# Patient Record
Sex: Female | Born: 1965 | Race: White | Hispanic: No | Smoking: Never smoker
Health system: Southern US, Community
[De-identification: ages and names within clinical notes are randomized; demographics above are authoritative.]

## PROBLEM LIST (undated history)

## (undated) DIAGNOSIS — R011 Cardiac murmur, unspecified: Secondary | ICD-10-CM

## (undated) DIAGNOSIS — J45909 Unspecified asthma, uncomplicated: Secondary | ICD-10-CM

## (undated) DIAGNOSIS — K8020 Calculus of gallbladder without cholecystitis without obstruction: Secondary | ICD-10-CM

## (undated) DIAGNOSIS — E785 Hyperlipidemia, unspecified: Secondary | ICD-10-CM

## (undated) DIAGNOSIS — M940 Chondrocostal junction syndrome [Tietze]: Secondary | ICD-10-CM

## (undated) DIAGNOSIS — F419 Anxiety disorder, unspecified: Secondary | ICD-10-CM

## (undated) DIAGNOSIS — F32A Depression, unspecified: Secondary | ICD-10-CM

## (undated) DIAGNOSIS — F509 Eating disorder, unspecified: Secondary | ICD-10-CM

## (undated) DIAGNOSIS — T4145XA Adverse effect of unspecified anesthetic, initial encounter: Secondary | ICD-10-CM

## (undated) DIAGNOSIS — K219 Gastro-esophageal reflux disease without esophagitis: Secondary | ICD-10-CM

## (undated) DIAGNOSIS — T8859XA Other complications of anesthesia, initial encounter: Secondary | ICD-10-CM

## (undated) DIAGNOSIS — D509 Iron deficiency anemia, unspecified: Secondary | ICD-10-CM

## (undated) DIAGNOSIS — K635 Polyp of colon: Secondary | ICD-10-CM

## (undated) DIAGNOSIS — F329 Major depressive disorder, single episode, unspecified: Secondary | ICD-10-CM

## (undated) DIAGNOSIS — I10 Essential (primary) hypertension: Secondary | ICD-10-CM

## (undated) DIAGNOSIS — M797 Fibromyalgia: Secondary | ICD-10-CM

## (undated) HISTORY — DX: Iron deficiency anemia, unspecified: D50.9

## (undated) HISTORY — PX: COLONOSCOPY: SHX174

## (undated) HISTORY — DX: Unspecified asthma, uncomplicated: J45.909

## (undated) HISTORY — PX: CHOLECYSTECTOMY: SHX55

## (undated) HISTORY — DX: Depression, unspecified: F32.A

## (undated) HISTORY — DX: Gastro-esophageal reflux disease without esophagitis: K21.9

## (undated) HISTORY — DX: Cardiac murmur, unspecified: R01.1

## (undated) HISTORY — DX: Calculus of gallbladder without cholecystitis without obstruction: K80.20

## (undated) HISTORY — DX: Fibromyalgia: M79.7

## (undated) HISTORY — DX: Eating disorder, unspecified: F50.9

## (undated) HISTORY — DX: Anxiety disorder, unspecified: F41.9

## (undated) HISTORY — DX: Polyp of colon: K63.5

## (undated) HISTORY — PX: GANGLION CYST EXCISION: SHX1691

## (undated) HISTORY — DX: Hyperlipidemia, unspecified: E78.5

## (undated) HISTORY — DX: Major depressive disorder, single episode, unspecified: F32.9

## (undated) HISTORY — DX: Essential (primary) hypertension: I10

---

## 1987-10-06 HISTORY — PX: TONSILLECTOMY: SHX5217

## 1997-10-05 LAB — HM SIGMOIDOSCOPY: HM Sigmoidoscopy: NORMAL

## 1998-10-05 HISTORY — PX: EXPLORATORY LAPAROTOMY: SUR591

## 2005-08-18 ENCOUNTER — Other Ambulatory Visit: Admission: RE | Admit: 2005-08-18 | Discharge: 2005-08-18 | Payer: Self-pay | Admitting: Gynecology

## 2006-05-17 ENCOUNTER — Encounter: Payer: Self-pay | Admitting: Family Medicine

## 2006-08-19 ENCOUNTER — Other Ambulatory Visit: Admission: RE | Admit: 2006-08-19 | Discharge: 2006-08-19 | Payer: Self-pay | Admitting: Gynecology

## 2007-08-24 ENCOUNTER — Other Ambulatory Visit: Admission: RE | Admit: 2007-08-24 | Discharge: 2007-08-24 | Payer: Self-pay | Admitting: Gynecology

## 2008-02-03 LAB — CONVERTED CEMR LAB: Pap Smear: NORMAL

## 2008-05-30 ENCOUNTER — Ambulatory Visit: Payer: Self-pay | Admitting: Family Medicine

## 2008-05-30 DIAGNOSIS — D509 Iron deficiency anemia, unspecified: Secondary | ICD-10-CM | POA: Insufficient documentation

## 2008-05-30 DIAGNOSIS — J45990 Exercise induced bronchospasm: Secondary | ICD-10-CM | POA: Insufficient documentation

## 2008-05-30 DIAGNOSIS — I10 Essential (primary) hypertension: Secondary | ICD-10-CM | POA: Insufficient documentation

## 2008-05-30 DIAGNOSIS — R011 Cardiac murmur, unspecified: Secondary | ICD-10-CM | POA: Insufficient documentation

## 2008-05-30 DIAGNOSIS — E785 Hyperlipidemia, unspecified: Secondary | ICD-10-CM | POA: Insufficient documentation

## 2008-05-30 DIAGNOSIS — M797 Fibromyalgia: Secondary | ICD-10-CM | POA: Insufficient documentation

## 2008-05-30 DIAGNOSIS — N943 Premenstrual tension syndrome: Secondary | ICD-10-CM | POA: Insufficient documentation

## 2008-05-30 DIAGNOSIS — G43009 Migraine without aura, not intractable, without status migrainosus: Secondary | ICD-10-CM | POA: Insufficient documentation

## 2008-06-07 ENCOUNTER — Telehealth: Payer: Self-pay | Admitting: Family Medicine

## 2008-06-21 ENCOUNTER — Ambulatory Visit: Payer: Self-pay | Admitting: Family Medicine

## 2008-07-09 ENCOUNTER — Ambulatory Visit: Payer: Self-pay | Admitting: Family Medicine

## 2008-07-10 ENCOUNTER — Telehealth: Payer: Self-pay | Admitting: Family Medicine

## 2008-07-12 ENCOUNTER — Ambulatory Visit: Payer: Self-pay | Admitting: Family Medicine

## 2008-07-14 ENCOUNTER — Observation Stay (HOSPITAL_COMMUNITY): Admission: EM | Admit: 2008-07-14 | Discharge: 2008-07-15 | Payer: Self-pay | Admitting: Emergency Medicine

## 2008-07-14 ENCOUNTER — Ambulatory Visit: Payer: Self-pay | Admitting: Internal Medicine

## 2008-07-16 ENCOUNTER — Ambulatory Visit: Payer: Self-pay | Admitting: Family Medicine

## 2008-07-16 ENCOUNTER — Telehealth: Payer: Self-pay | Admitting: Family Medicine

## 2008-07-18 ENCOUNTER — Encounter: Payer: Self-pay | Admitting: Family Medicine

## 2008-07-18 ENCOUNTER — Ambulatory Visit: Payer: Self-pay

## 2008-07-19 ENCOUNTER — Ambulatory Visit: Payer: Self-pay | Admitting: Family Medicine

## 2008-07-25 ENCOUNTER — Ambulatory Visit: Payer: Self-pay | Admitting: Family Medicine

## 2008-07-25 LAB — CONVERTED CEMR LAB
Basophils Absolute: 0.1 10*3/uL (ref 0.0–0.1)
Basophils Relative: 0.8 % (ref 0.0–3.0)
Eosinophils Absolute: 0.2 10*3/uL (ref 0.0–0.7)
Eosinophils Relative: 1.6 % (ref 0.0–5.0)
HCT: 42.2 % (ref 36.0–46.0)
Hemoglobin: 14.7 g/dL (ref 12.0–15.0)
Lymphocytes Relative: 27.1 % (ref 12.0–46.0)
MCHC: 34.9 g/dL (ref 30.0–36.0)
MCV: 93.9 fL (ref 78.0–100.0)
Mono Screen: NEGATIVE
Monocytes Absolute: 0.5 10*3/uL (ref 0.1–1.0)
Monocytes Relative: 4.9 % (ref 3.0–12.0)
Neutro Abs: 6.1 10*3/uL (ref 1.4–7.7)
Neutrophils Relative %: 65.6 % (ref 43.0–77.0)
Platelets: 318 10*3/uL (ref 150–400)
RBC: 4.5 M/uL (ref 3.87–5.11)
RDW: 12.1 % (ref 11.5–14.6)
Sed Rate: 9 mm/hr (ref 0–22)
TSH: 1.31 microintl units/mL (ref 0.35–5.50)
Vitamin B-12: 401 pg/mL (ref 211–911)
WBC: 9.4 10*3/uL (ref 4.5–10.5)

## 2008-08-09 ENCOUNTER — Telehealth: Payer: Self-pay | Admitting: Family Medicine

## 2008-09-26 ENCOUNTER — Ambulatory Visit: Payer: Self-pay | Admitting: Family Medicine

## 2008-10-12 ENCOUNTER — Ambulatory Visit: Payer: Self-pay | Admitting: Family Medicine

## 2008-10-23 ENCOUNTER — Ambulatory Visit: Payer: Self-pay | Admitting: Family Medicine

## 2008-10-24 DIAGNOSIS — R7309 Other abnormal glucose: Secondary | ICD-10-CM | POA: Insufficient documentation

## 2008-10-24 LAB — CONVERTED CEMR LAB
ALT: 24 units/L (ref 0–35)
AST: 24 units/L (ref 0–37)
Albumin: 3.5 g/dL (ref 3.5–5.2)
Alkaline Phosphatase: 45 units/L (ref 39–117)
BUN: 12 mg/dL (ref 6–23)
Bilirubin, Direct: 0.1 mg/dL (ref 0.0–0.3)
CO2: 26 meq/L (ref 19–32)
Calcium: 9.2 mg/dL (ref 8.4–10.5)
Chloride: 107 meq/L (ref 96–112)
Cholesterol: 224 mg/dL (ref 0–200)
Creatinine, Ser: 1 mg/dL (ref 0.4–1.2)
Direct LDL: 161.9 mg/dL
GFR calc Af Amer: 79 mL/min
GFR calc non Af Amer: 65 mL/min
Glucose, Bld: 106 mg/dL — ABNORMAL HIGH (ref 70–99)
HDL: 48.7 mg/dL (ref >39.0)
Potassium: 4 meq/L (ref 3.5–5.1)
Sodium: 139 meq/L (ref 135–145)
TSH: 1.44 microintl units/mL (ref 0.35–5.50)
Total Bilirubin: 0.7 mg/dL (ref 0.3–1.2)
Total CHOL/HDL Ratio: 4.6
Total Protein: 6.8 g/dL (ref 6.0–8.3)
Triglycerides: 148 mg/dL (ref 0–149)
VLDL: 30 mg/dL (ref 0–40)

## 2008-10-31 ENCOUNTER — Telehealth: Payer: Self-pay | Admitting: Family Medicine

## 2008-11-13 ENCOUNTER — Ambulatory Visit: Payer: Self-pay | Admitting: Family Medicine

## 2008-11-14 ENCOUNTER — Telehealth: Payer: Self-pay | Admitting: Family Medicine

## 2008-11-16 ENCOUNTER — Telehealth: Payer: Self-pay | Admitting: Family Medicine

## 2008-11-16 ENCOUNTER — Encounter: Payer: Self-pay | Admitting: Family Medicine

## 2008-11-20 ENCOUNTER — Telehealth: Payer: Self-pay | Admitting: Family Medicine

## 2008-11-21 ENCOUNTER — Encounter: Payer: Self-pay | Admitting: Family Medicine

## 2008-11-26 ENCOUNTER — Encounter: Payer: Self-pay | Admitting: Family Medicine

## 2008-12-12 ENCOUNTER — Telehealth: Payer: Self-pay | Admitting: Family Medicine

## 2008-12-24 ENCOUNTER — Ambulatory Visit (HOSPITAL_COMMUNITY): Payer: Self-pay | Admitting: Licensed Clinical Social Worker

## 2009-01-01 ENCOUNTER — Ambulatory Visit (HOSPITAL_BASED_OUTPATIENT_CLINIC_OR_DEPARTMENT_OTHER): Admission: RE | Admit: 2009-01-01 | Discharge: 2009-01-01 | Payer: Self-pay | Admitting: Orthopedic Surgery

## 2009-01-09 ENCOUNTER — Ambulatory Visit (HOSPITAL_COMMUNITY): Payer: Self-pay | Admitting: Psychiatry

## 2009-01-23 ENCOUNTER — Ambulatory Visit (HOSPITAL_COMMUNITY): Payer: Self-pay | Admitting: Licensed Clinical Social Worker

## 2009-01-23 ENCOUNTER — Ambulatory Visit: Payer: Self-pay | Admitting: Family Medicine

## 2009-01-28 LAB — CONVERTED CEMR LAB
BUN: 6 mg/dL (ref 6–23)
CO2: 28 meq/L (ref 19–32)
Calcium: 8.9 mg/dL (ref 8.4–10.5)
Chloride: 107 meq/L (ref 96–112)
Cholesterol: 230 mg/dL — ABNORMAL HIGH (ref 0–200)
Creatinine, Ser: 1 mg/dL (ref 0.4–1.2)
Direct LDL: 154.6 mg/dL
GFR calc non Af Amer: 65.01 mL/min (ref >60)
Glucose, Bld: 94 mg/dL (ref 70–99)
HDL: 44.7 mg/dL (ref >39.00)
Potassium: 3.5 meq/L (ref 3.5–5.1)
Sodium: 142 meq/L (ref 135–145)
Total CHOL/HDL Ratio: 5
Triglycerides: 168 mg/dL — ABNORMAL HIGH (ref 0.0–149.0)
VLDL: 33.6 mg/dL (ref 0.0–40.0)

## 2009-01-30 ENCOUNTER — Ambulatory Visit: Payer: Self-pay | Admitting: Family Medicine

## 2009-01-30 DIAGNOSIS — L708 Other acne: Secondary | ICD-10-CM | POA: Insufficient documentation

## 2009-02-01 LAB — CONVERTED CEMR LAB: Hgb A1c MFr Bld: 5.5 % (ref 4.6–6.5)

## 2009-02-11 ENCOUNTER — Ambulatory Visit (HOSPITAL_COMMUNITY): Payer: Self-pay | Admitting: Licensed Clinical Social Worker

## 2009-02-27 ENCOUNTER — Telehealth: Payer: Self-pay | Admitting: Family Medicine

## 2009-03-20 ENCOUNTER — Ambulatory Visit (HOSPITAL_COMMUNITY): Payer: Self-pay | Admitting: Psychiatry

## 2009-04-04 LAB — CONVERTED CEMR LAB: Pap Smear: ABNORMAL

## 2009-04-10 ENCOUNTER — Telehealth: Payer: Self-pay | Admitting: Family Medicine

## 2009-04-22 ENCOUNTER — Ambulatory Visit (HOSPITAL_COMMUNITY): Payer: Self-pay | Admitting: Psychiatry

## 2009-04-25 ENCOUNTER — Ambulatory Visit: Payer: Self-pay | Admitting: Family Medicine

## 2009-04-29 LAB — CONVERTED CEMR LAB
ALT: 26 units/L (ref 0–35)
AST: 28 units/L (ref 0–37)
Cholesterol: 211 mg/dL — ABNORMAL HIGH (ref 0–200)
Direct LDL: 142.2 mg/dL
HDL: 53.5 mg/dL (ref >39.00)
Total CHOL/HDL Ratio: 4
Triglycerides: 163 mg/dL — ABNORMAL HIGH (ref 0.0–149.0)
VLDL: 32.6 mg/dL (ref 0.0–40.0)

## 2009-05-02 ENCOUNTER — Ambulatory Visit: Payer: Self-pay | Admitting: Family Medicine

## 2009-05-02 LAB — CONVERTED CEMR LAB
Cholesterol, target level: 200 mg/dL
HDL goal, serum: 40 mg/dL
LDL Goal: 130 mg/dL

## 2009-05-20 ENCOUNTER — Ambulatory Visit (HOSPITAL_COMMUNITY): Payer: Self-pay | Admitting: Psychiatry

## 2009-06-03 ENCOUNTER — Telehealth: Payer: Self-pay | Admitting: Family Medicine

## 2009-07-12 ENCOUNTER — Telehealth: Payer: Self-pay | Admitting: Family Medicine

## 2009-07-15 ENCOUNTER — Encounter: Payer: Self-pay | Admitting: Family Medicine

## 2009-08-02 ENCOUNTER — Encounter: Payer: Self-pay | Admitting: Family Medicine

## 2009-08-26 ENCOUNTER — Telehealth: Payer: Self-pay | Admitting: Family Medicine

## 2009-08-27 ENCOUNTER — Ambulatory Visit: Payer: Self-pay | Admitting: Family Medicine

## 2009-08-28 ENCOUNTER — Encounter: Payer: Self-pay | Admitting: Family Medicine

## 2009-10-03 ENCOUNTER — Ambulatory Visit: Payer: Self-pay | Admitting: Family Medicine

## 2009-10-07 ENCOUNTER — Telehealth: Payer: Self-pay | Admitting: Family Medicine

## 2009-10-24 ENCOUNTER — Ambulatory Visit: Payer: Self-pay | Admitting: Family Medicine

## 2009-12-03 ENCOUNTER — Telehealth: Payer: Self-pay | Admitting: Family Medicine

## 2009-12-20 ENCOUNTER — Encounter (INDEPENDENT_AMBULATORY_CARE_PROVIDER_SITE_OTHER): Payer: Self-pay | Admitting: *Deleted

## 2009-12-20 ENCOUNTER — Ambulatory Visit: Payer: Self-pay | Admitting: Family Medicine

## 2009-12-20 LAB — CONVERTED CEMR LAB: Rapid Strep: NEGATIVE

## 2010-01-15 ENCOUNTER — Telehealth: Payer: Self-pay | Admitting: Family Medicine

## 2010-03-17 ENCOUNTER — Telehealth: Payer: Self-pay | Admitting: Family Medicine

## 2010-03-21 ENCOUNTER — Telehealth: Payer: Self-pay | Admitting: Family Medicine

## 2010-04-01 ENCOUNTER — Ambulatory Visit: Payer: Self-pay | Admitting: Family Medicine

## 2010-04-01 DIAGNOSIS — M25569 Pain in unspecified knee: Secondary | ICD-10-CM | POA: Insufficient documentation

## 2010-04-04 LAB — HM PAP SMEAR

## 2010-04-04 LAB — CONVERTED CEMR LAB
Pap Smear: NORMAL
Pap Smear: NORMAL

## 2010-04-04 LAB — HM MAMMOGRAPHY: HM Mammogram: NORMAL

## 2010-04-09 DIAGNOSIS — F341 Dysthymic disorder: Secondary | ICD-10-CM | POA: Insufficient documentation

## 2010-04-18 ENCOUNTER — Ambulatory Visit: Payer: Self-pay | Admitting: Family Medicine

## 2010-04-18 LAB — CONVERTED CEMR LAB: LDL Cholesterol: 135 mg/dL

## 2010-04-21 LAB — CONVERTED CEMR LAB
ALT: 20 units/L (ref 0–35)
AST: 20 units/L (ref 0–37)
Albumin: 3.9 g/dL (ref 3.5–5.2)
Alkaline Phosphatase: 47 units/L (ref 39–117)
BUN: 16 mg/dL (ref 6–23)
Bilirubin, Direct: 0.1 mg/dL (ref 0.0–0.3)
CO2: 27 meq/L (ref 19–32)
Calcium: 9 mg/dL (ref 8.4–10.5)
Chloride: 109 meq/L (ref 96–112)
Cholesterol: 202 mg/dL — ABNORMAL HIGH (ref 0–200)
Creatinine, Ser: 0.8 mg/dL (ref 0.4–1.2)
Direct LDL: 135.9 mg/dL
GFR calc non Af Amer: 82.41 mL/min (ref >60)
Glucose, Bld: 102 mg/dL — ABNORMAL HIGH (ref 70–99)
HDL: 47.3 mg/dL (ref >39.00)
Potassium: 4.4 meq/L (ref 3.5–5.1)
Sodium: 138 meq/L (ref 135–145)
Total Bilirubin: 0.3 mg/dL (ref 0.3–1.2)
Total CHOL/HDL Ratio: 4
Total Protein: 6.6 g/dL (ref 6.0–8.3)
Triglycerides: 197 mg/dL — ABNORMAL HIGH (ref 0.0–149.0)
VLDL: 39.4 mg/dL (ref 0.0–40.0)

## 2010-04-22 ENCOUNTER — Ambulatory Visit: Payer: Self-pay | Admitting: Family Medicine

## 2010-04-23 ENCOUNTER — Encounter: Admission: RE | Admit: 2010-04-23 | Discharge: 2010-04-23 | Payer: Self-pay | Admitting: Gynecology

## 2010-05-01 ENCOUNTER — Encounter: Admission: RE | Admit: 2010-05-01 | Discharge: 2010-05-01 | Payer: Self-pay | Admitting: Orthopaedic Surgery

## 2010-06-17 ENCOUNTER — Ambulatory Visit: Payer: Self-pay | Admitting: Family Medicine

## 2010-06-17 DIAGNOSIS — T678XXA Other effects of heat and light, initial encounter: Secondary | ICD-10-CM | POA: Insufficient documentation

## 2010-06-17 DIAGNOSIS — D353 Benign neoplasm of craniopharyngeal duct: Secondary | ICD-10-CM

## 2010-06-17 DIAGNOSIS — D352 Benign neoplasm of pituitary gland: Secondary | ICD-10-CM | POA: Insufficient documentation

## 2010-06-24 ENCOUNTER — Ambulatory Visit: Payer: Self-pay | Admitting: Family Medicine

## 2010-06-24 ENCOUNTER — Encounter: Payer: Self-pay | Admitting: Family Medicine

## 2010-07-21 ENCOUNTER — Telehealth: Payer: Self-pay | Admitting: Family Medicine

## 2010-07-22 ENCOUNTER — Telehealth (INDEPENDENT_AMBULATORY_CARE_PROVIDER_SITE_OTHER): Payer: Self-pay | Admitting: *Deleted

## 2010-07-22 ENCOUNTER — Ambulatory Visit: Payer: Self-pay | Admitting: Gastroenterology

## 2010-07-26 ENCOUNTER — Emergency Department (HOSPITAL_COMMUNITY): Admission: EM | Admit: 2010-07-26 | Discharge: 2010-07-26 | Payer: Self-pay | Admitting: Emergency Medicine

## 2010-08-08 ENCOUNTER — Ambulatory Visit: Payer: Self-pay | Admitting: Family Medicine

## 2010-08-12 LAB — CONVERTED CEMR LAB
Estradiol: 45.9 pg/mL
FSH: 4.4 milliintl units/mL
LH: 5.6 milliintl units/mL

## 2010-08-26 ENCOUNTER — Ambulatory Visit: Payer: Self-pay | Admitting: Gastroenterology

## 2010-09-02 ENCOUNTER — Ambulatory Visit: Payer: Self-pay | Admitting: Family Medicine

## 2010-09-11 ENCOUNTER — Telehealth: Payer: Self-pay | Admitting: Family Medicine

## 2010-09-12 ENCOUNTER — Encounter: Payer: Self-pay | Admitting: Family Medicine

## 2010-09-19 ENCOUNTER — Ambulatory Visit: Payer: Self-pay | Admitting: Family Medicine

## 2010-10-24 ENCOUNTER — Ambulatory Visit: Admit: 2010-10-24 | Payer: Self-pay | Admitting: Family Medicine

## 2010-10-24 DIAGNOSIS — Z0289 Encounter for other administrative examinations: Secondary | ICD-10-CM

## 2010-11-04 NOTE — Letter (Signed)
Summary: Work Dietitian at [employer]  [address] [place], [place] [address]   Phone: [phone]  Fax: [phone]    Today's Date: December 20, 2009  Name of Patient: Selena [name]  The above named patient had a medical visit today at: 10:00 am .  Please take this into consideration when reviewing the time away from work/school.    Special Instructions:  [ X ] None  [  ] To be off the remainder of today, returning to the normal work / school schedule tomorrow.  [  ] To be off until the next scheduled appointment on ______________________.  [  ] Other ________________________________________________________________ ________________________________________________________________________   Sincerely yours,  Kerby Nora MD

## 2010-11-04 NOTE — Assessment & Plan Note (Signed)
Summary: FEELS LIKE SOMETHING IS IN THROAT   Vital Signs:  Patient profile:   45 year old female Height:      63.25 inches Weight:      226.0 pounds BMI:     39.86 Temp:     98.3 degrees F oral Pulse rate:   84 / minute Pulse rhythm:   regular BP sitting:   122 / 88  (left arm) Cuff size:   large  Vitals Entered By: Delilah Shan CMA Duncan Dull) (June 24, 2010 3:53 PM) CC: Feels like seomthing in her throat   History of Present Illness: Feeling of something in throat intermittently for last 3 days.  H/o NSAID use.  No vomiting.  "If feels like things are sticking on the way down."  worse with soda.  No absolute dysphagia as all fluids and liquids are passed.  No choking, no NAV.  No FC, or BRBPR.  Some pain with swallowing.  No known sick contacts.  Feeling well o/w.    Allergies: 1)  ! Sulfa 2)  ! * Topamax  Review of Systems       See HPI.  Otherwise negative.    Physical Exam  General:  GEN: nad, alert and oriented HEENT: mucous membranes moist, no acute abnormality in OP NECK: supple w/o LA CV: rrr.  no murmur PULM: ctab, no inc wob ABD: soft, +bs EXT: no edema SKIN: no acute rash    Impression & Recommendations:  Problem # 1:  ? of GERD (ICD-530.81) Neck w/o LA and no OP finding of significance.  Likely GERD symptoms with esophageal irritation exacerbated by sodas and NSAIDS.  d/w patient. Would start PPI and increase to two times a day as needed.  follow up as needed.  No other emergent symptoms and okay for outpatient follow up.  Would rec GI follow up if symptoms continue for consideration of EGD. She understood.    Complete Medication List: 1)  Kls Allerclear D-24hr 10-240 Mg Xr24h-tab (Loratadine-pseudoephedrine) .... Take 1 tablet by mouth once a day 2)  Klonopin 1 Mg Tabs (Clonazepam) .... Take 1 tab by mouth at bedtime 3)  Simvastatin 20 Mg Tabs (Simvastatin) .Marland Kitchen.. 1 tab by mouth daily 4)  Camila 0.35 Mg Tabs (Norethindrone (contraceptive)) .... Take 1  tablet by mouth once a day 5)  Fluoxetine Hcl 10 Mg Caps (Fluoxetine hcl) .Marland Kitchen.. 1 tab by mouth daily 6)  Maxalt 10 Mg Tabs (Rizatriptan benzoate) .... For migraines 7)  Triamcinolone Acetonide 0.5 % Crea (Triamcinolone acetonide) .... Aaa two times a day 8)  Tizanidine Hcl 4 Mg Tabs (Tizanidine hcl) .... Take one tablet every 8 hours do not exceed 3 in 24 houts 9)  Diclofenac Sodium 75 Mg Tbec (Diclofenac sodium) .... One by mouth 2 times daily wiht meals 10)  Amitriptyline Hcl 25 Mg Tabs (Amitriptyline hcl) .Marland Kitchen.. 1 tab by mouth at bedtime 11)  Metformin Hcl 500 Mg Tabs (Metformin hcl) .... Take one tablet 2 times daily  Patient Instructions: 1)  Take generic prilosec 20mg  once daily.  If not better, increase to two times a day.  If still not better, let us know.  Take care.  Glad to see you.   Current Allergies (reviewed today): ! SULFA ! * TOPAMAX

## 2010-11-04 NOTE — Procedures (Signed)
Summary: Upper Endoscopy  Patient: Selena [name] Note: All result statuses are Final unless otherwise noted.  Tests: (1) Upper Endoscopy (EGD)   EGD Upper Endoscopy       DONE     [facility]     [address]     [place], [place]  [address]           ENDOSCOPY PROCEDURE REPORT           PATIENT:  Selena [name], Selena [name]  MR#:  [id number]     BIRTHDATE:  [birth year], 44 yrs. old  GENDER:  female           ENDOSCOPIST:  Barbette Hair. Arlyce Dice, MD     Referred by:           PROCEDURE DATE:  08/26/2010     PROCEDURE:  EGD, diagnostic 43235, Maloney Dilation of Esophagus     ASA CLASS:  Class I     INDICATIONS:  globus           MEDICATIONS:   Fentanyl 100 mcg IV, Versed 10 mg IV, Benadryl 25     mg IV, glycopyrrolate (Robinal) 0.2 mg IV, 0.6cc simethancone 0.6     cc PO     TOPICAL ANESTHETIC:  Exactacain Spray           DESCRIPTION OF PROCEDURE:   After the risks benefits and     alternatives of the procedure were thoroughly explained, informed     consent was obtained.  The LB GIF-H180 [id number] endoscope was     introduced through the mouth and advanced to the third portion of     the duodenum, without limitations.  The instrument was slowly     withdrawn as the mucosa was fully examined.     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 Early stricture Dilation with maloney dilator 18mm Mild     resistance; no heme  Otherwise the examination was normal.     Retroflexed views revealed no abnormalities.    The scope was then     withdrawn from the patient and the procedure completed.           COMPLICATIONS:  None           ENDOSCOPIC IMPRESSION:     1) Stricture at the gastroesophageal junction - s/p maloney     dilitation     2) Otherwise normal examination     RECOMMENDATIONS:     1) continue PPI     2) Call office next 2-3 days to schedule an office appointment     for     3) dilatations PRN     4)  Sedation with MAC for future procedures           REPEAT EXAM:  No           ______________________________     Barbette Hair. Arlyce Dice, MD           CC:  Crawford Givens, MD, Amy Michelle Nasuti, MD           n.     Rosalie Doctor:   Barbette Hair. Kaplan at 08/26/2010 03:41 PM           [name], [phone]  Note: An exclamation mark (!) indicates a result that was not dispersed into the flowsheet. Document Creation Date: 08/26/2010 3:41 PM _______________________________________________________________________  (1) Order  result status: Final Collection or observation date-time: 08/26/2010 15:35 Requested date-time:  Receipt date-time:  Reported date-time:  Referring Physician:   Ordering Physician: Melvia Heaps [phone]) Specimen Source:  Source: Launa Grill Order Number: [id number] Lab site:   Appended Document: Upper Endoscopy    Clinical Lists Changes

## 2010-11-04 NOTE — Assessment & Plan Note (Signed)
Summary: having sweats/alc   Vital Signs:  Patient profile:   45 year old female Height:      63.25 inches Weight:      226.0 pounds BMI:     39.86 Temp:     98.4 degrees F oral Pulse rate:   80 / minute Pulse rhythm:   regular BP sitting:   110 / 70  (left arm) Cuff size:   large  Vitals Entered By: Benny Lennert CMA Duncan Dull) (June 17, 2010 3:16 PM)  History of Present Illness:    Last OV 04/2010 DEPRESSION/ANXIETY (ICD-300.4) Decrease back to 10 mg fluoxetine due to SE.   Start amitryptiline nighty at bedtime..start with 1 tablet, may increase to 2 tabs if not improving.  Missed  follow-up appointment  Today reports mood is well controlled on sertraline...not sure if amitryiptiline helped..Out now.  Sleeping well at night using klonpin at bedtime.   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 Has follow up MRi in 9 months.  Sweating in past 4 weeks...mainily on head. She is concerned it is panic attack. Occuring in AMs. Blood sugars during episodes are 90.  Has had some  sweating with metformin in past... restarted  med 2 months ago...  Has run out of atenolol...no heart racing and BPs well controlled.  Mother did not go through menopause.  Has had nml hormone testing and TSH at GYN.     Problems Prior to Update: 1)  Knee Pain, Bilateral  (ICD-719.46) 2)  Gastroenteritis, Viral  (ICD-008.8) 3)  Pharyngitis, Viral  (ICD-462) 4)  Sinusitis, Acute  (ICD-461.9) 5)  Dermatitis, Allergic  (ICD-692.9) 6)  Abdominal Pain, Left Lower Quadrant  (ICD-789.04) 7)  Acne Vulgaris  (ICD-706.1) 8)  Folliculitis  (ICD-704.8) 9)  Ganglion Cyst, Wrist, Left  (ICD-727.41) 10)  Prediabetes  (ICD-790.29) 11)  Screening For Lipoid Disorders  (ICD-V77.91) 12)  Foot Pain, Left  (ICD-729.5) 13)  Obesity, Unspecified  (ICD-278.00) 14)  Hx of Cardiac Murmur  (ICD-785.2) 15)  Hypertension  (ICD-401.9) 16)  Hyperlipidemia  (ICD-272.4) 17)  Common Migraine   (ICD-346.10) 18)  Fibromyalgia  (ICD-729.1) 19)  Premenstrual Dysphoric Syndrome  (ICD-625.4) 20)  Depression/anxiety  (ICD-300.4) 21)  Anemia-iron Deficiency  (ICD-280.9) 22)  Asthma, Exercise Induced  (ICD-493.81)  Current Medications (verified): 1)  Kls Allerclear D-24hr 10-240 Mg Xr24h-Tab (Loratadine-Pseudoephedrine) .... Take 1 Tablet By Mouth Once A Day 2)  Klonopin 1 Mg Tabs (Clonazepam) .... Take 1 Tab By Mouth At Bedtime 3)  Simvastatin 20 Mg Tabs (Simvastatin) .Marland Kitchen.. 1 Tab By Mouth Daily 4)  Camila 0.35 Mg Tabs (Norethindrone (Contraceptive)) .... Take 1 Tablet By Mouth Once A Day 5)  Fluoxetine Hcl 10 Mg Caps (Fluoxetine Hcl) .Marland Kitchen.. 1 Tab By Mouth Daily 6)  Maxalt 10 Mg Tabs (Rizatriptan Benzoate) .... For Migraines 7)  Triamcinolone Acetonide 0.5 % Crea (Triamcinolone Acetonide) .... Aaa Two Times A Day 8)  Tizanidine Hcl 4 Mg Tabs (Tizanidine Hcl) .... Take One Tablet Every 8 Hours Do Not Exceed 3 in 24 Houts 9)  Diclofenac Sodium 75 Mg Tbec (Diclofenac Sodium) .... One By Mouth 2 Times Daily Wiht Meals 10)  Atenolol 50 Mg Tabs (Atenolol) .... One Tablet Daily 11)  Amitriptyline Hcl 25 Mg Tabs (Amitriptyline Hcl) .Marland Kitchen.. 1-2 Tab By Mouth At Bedtime 12)  Metformin Hcl 500 Mg Tabs (Metformin Hcl) .... Take One Tablet 2 Times Daily  Allergies: 1)  ! Sulfa 2)  ! * Topamax  Past History:  Past medical, surgical,  family and social histories (including risk factors) reviewed, and no changes noted (except as noted below).  Past Medical History: Reviewed history from 05/30/2008 and no changes required. Anemia-iron deficiency Depression Hyperlipidemia Hypertension  Past Surgical History: Reviewed history from 05/30/2008 and no changes required. 2000 exp lap for infertility 2001 gallbladder surgery 1989 tosillectomy  Family History: Reviewed history from 05/30/2008 and no changes required. father: DM, bipolar mother: high cholesterol, atrial fibrillation, ? melanoma sister:  clear cell sarcoma in leg, hypothyroid MGM: Afib, HTN PGM: arthritis  Social History: Reviewed history from 05/30/2008 and no changes required. Occupation:teaches preschool Married 1 child, 2 step children: asthma, ADHD Never Smoked Alcohol use-yes, 4 times a month was married to alcoholic in past Drug use-no Regular exercise-yes 3-4 times a week Diet:  has tried weight watchers, [employer]il scientist craig 3 meals a day, fruit and veggies, water, some caffeine a day  Physical Exam  General:  obese appearing female in NAD Mouth:  Oral mucosa and oropharynx without lesions or exudates.  Teeth in good repair. Neck:  no carotid bruit or thyromegaly no cervical or supraclavicular lymphadenopathy  Lungs:  Normal respiratory effort, chest expands symmetrically. Lungs are clear to auscultation, no crackles or wheezes. Heart:  Normal rate and regular rhythm. S1 and S2 normal without gallop, murmur, click, rub or other extra sounds. Pulses:  R and L posterior tibial pulses are full and equal bilaterally  Extremities:  no edema  Psych:  Cognition and judgment appear intact. Alert and cooperative with normal attention span and concentration. 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 Check BP during episodes. ? Due to anxiety/[panic attacks. Restart amitryptiline at bedtime. Follow up in 1 month.  TSh and hormone eval nml, has had full endo eval by endocrinologist in last year.   Of note..ran out of atenolol but BP stable...? if coming off this med caused heat SE.Marland KitchenHR and Bp stable.   Problem # 3:  PREDIABETES (ICD-790.29) No clear low CBGs during heat intolerence episodes. Her updated medication list for this problem includes:    Metformin Hcl 500 Mg Tabs (Metformin hcl) .Marland Kitchen... Take one  tablet 2 times daily  Complete Medication List: 1)  Kls Allerclear D-24hr 10-240 Mg Xr24h-tab (Loratadine-pseudoephedrine) .... Take 1 tablet by mouth once a day 2)  Klonopin 1 Mg Tabs (Clonazepam) .... Take 1 tab by mouth at bedtime 3)  Simvastatin 20 Mg Tabs (Simvastatin) .Marland Kitchen.. 1 tab by mouth daily 4)  Camila 0.35 Mg Tabs (Norethindrone (contraceptive)) .... Take 1 tablet by mouth once a day 5)  Fluoxetine Hcl 10 Mg Caps (Fluoxetine hcl) .Marland Kitchen.. 1 tab by mouth daily 6)  Maxalt 10 Mg Tabs (Rizatriptan benzoate) .... For migraines 7)  Triamcinolone Acetonide 0.5 % Crea (Triamcinolone acetonide) .... Aaa two times a day 8)  Tizanidine Hcl 4 Mg Tabs (Tizanidine hcl) .... Take one tablet every 8 hours do not exceed 3 in 24 houts 9)  Diclofenac Sodium 75 Mg Tbec (Diclofenac sodium) .... One by mouth 2 times daily wiht meals 10)  Amitriptyline Hcl 25 Mg Tabs (Amitriptyline hcl) .Marland Kitchen.. 1 tab by mouth at bedtime 11)  Metformin Hcl 500 Mg Tabs (Metformin hcl) .... Take one tablet 2 times daily  Patient Instructions: 1)  Continue holding  atenolol. 2)  Restart amitryptiline at bedtime. 3)  When sweating spells...check  Blood pressure.. Call if greater than 140/90. 4)  Increase exercise. Continue to work on weight loss. 5)  Follow up in 1 month. Prescriptions: AMITRIPTYLINE HCL 25 MG TABS (AMITRIPTYLINE HCL) 1 tab by mouth at bedtime  #30 x 11   Entered and Authorized by:   Kerby Nora MD   Signed by:   Kerby Nora MD on 06/17/2010   Method used:   Electronically to        [facility] [id number] (retail)       [address]       [place], [place]  [address]       Ph: [phone] or [phone]       Fax: [phone]   RxID:   [id number]   Current Allergies (reviewed today): ! SULFA ! * TOPAMAX  Flu Vaccine Next Due:  Refused Last PAP:  abnormal (04/04/2009 9:25:33 AM) PAP Result Date:  04/04/2010 PAP Result:  normal PAP Next Due:  1 yr Last Mammogram:  normal (04/04/2009  9:26:03 AM) Mammogram Result Date:  04/04/2010 Mammogram Result:  normal Mammogram Next Due:  1 yr

## 2010-11-04 NOTE — Progress Notes (Signed)
Summary: Doc of the day  Phone Note From Other Clinic   Caller: Campbell Stall  [phone] Call For: Dr. Jarold Motto Summary of Call: having problems swallowing...feels like something is "stuck" in her esophagus Initial call taken by: Karna Christmas,  July 22, 2010 10:41 AM  Follow-up for Phone Call        Pt will come today and see Dr Arlyce Dice at 3:00.  I have spoke with the pt herself.  She is aware of the appt and the co pay/ no show policy. Follow-up by: Jesse Fall RN,  July 22, 2010 11:00 AM

## 2010-11-04 NOTE — Letter (Signed)
Summary: Diabetic Instructions  [facility]  [address] [place], [place] [address]   Phone: [phone]  Fax: [phone]    Selena [name] [birth year] MRN: [id number]   X    ORAL DIABETIC MEDICATION INSTRUCTIONS  The day before your procedure:   Take your diabetic pill as you do normally  The day of your procedure:   Do not take your diabetic pill    We will check your blood sugar levels during the admission process and again in Recovery before discharging you home  ________________________________________________________________________  _  _   INSULIN (LONG ACTING) MEDICATION INSTRUCTIONS (Lantus, NPH, 70/30, Humulin, Novolin-N)   The day before your procedure:   Take  your regular evening dose    The day of your procedure:   Do not take your morning dose    _  _   INSULIN (SHORT ACTING) MEDICATION INSTRUCTIONS (Regular, Humulog, Novolog)   The day before your procedure:   Do not take your evening dose   The day of your procedure:   Do not take your morning dose   _  _   INSULIN PUMP MEDICATION INSTRUCTIONS  We will contact the physician managing your diabetic care for written dosage instructions for the day before your procedure and the day of your procedure.  Once we have received the instructions, we will contact you.

## 2010-11-04 NOTE — Assessment & Plan Note (Signed)
Summary: dysphagia/sheri   History of Present Illness Visit Type: Initial Consult Primary GI MD: Melvia Heaps MD [facility] Primary Provider: Kerby Nora, MD Requesting Provider: Binnie Rail, MD Chief Complaint: Patient c/o several weeks "lump in throat" as well as some regurgiation of food. She denies any abdominal pain. There has been some belching  History of Present Illness:   Ms. Franzen is a 45 year old white female referred at the request of Dr. Para March for evaluation of globus.  With swallowing she has a constant sensation of globus with liquids and solids.  She has had episodes of regurgitation of gastric contents.  Symptoms have not especially improved after starting Prilosec.  She denies nausea or odynophagia.  She was taking Clinoril about once a week for fibromyalgia.She  recently discontinued this.   GI Review of Systems    Reports belching and  chest pain.      Denies abdominal pain, acid reflux, bloating, dysphagia with liquids, dysphagia with solids, heartburn, loss of appetite, nausea, vomiting, vomiting blood, weight loss, and  weight gain.      Reports constipation.     Denies anal fissure, black tarry stools, change in bowel habit, diarrhea, diverticulosis, fecal incontinence, heme positive stool, hemorrhoids, irritable bowel syndrome, jaundice, light color stool, liver problems, rectal bleeding, and  rectal pain.    Current Medications (verified): 1)  Kls Allerclear D-24hr 10-240 Mg Xr24h-Tab (Loratadine-Pseudoephedrine) .... Take 1 Tablet By Mouth Once A Day 2)  Klonopin 1 Mg Tabs (Clonazepam) .... Take 1 Tab By Mouth At Bedtime 3)  Simvastatin 20 Mg Tabs (Simvastatin) .Marland Kitchen.. 1 Tab By Mouth Daily 4)  Camila 0.35 Mg Tabs (Norethindrone (Contraceptive)) .... Take 1 Tablet By Mouth Once A Day 5)  Fluoxetine Hcl 10 Mg Caps (Fluoxetine Hcl) .Marland Kitchen.. 1 Tab By Mouth Daily 6)  Maxalt 10 Mg Tabs (Rizatriptan Benzoate) .... As Needed For Migraines 7)  Triamcinolone Acetonide 0.5 % Crea  (Triamcinolone Acetonide) .... Aaa Two Times A Day 8)  Tizanidine Hcl 4 Mg Tabs (Tizanidine Hcl) .... Take One Tablet Every 8 Hours Do Not Exceed 3 in 24 Houts 9)  Diclofenac Sodium 75 Mg Tbec (Diclofenac Sodium) .... One By Mouth 2 Times Daily With Meals As Needed 10)  Amitriptyline Hcl 25 Mg Tabs (Amitriptyline Hcl) .Marland Kitchen.. 1 Tab By Mouth At Bedtime 11)  Metformin Hcl 500 Mg Tabs (Metformin Hcl) .... Take One Tablet 2 Times Daily 12)  Methocarbamol 500 Mg Tabs (Methocarbamol) .... Take 1 Tablet By Mouth Three Times A Day As Needed 13)  Prilosec Otc 20 Mg Tbec (Omeprazole Magnesium) .... Take 1 Tablet By Mouth Two Times A Day 14)  Vitamin D (Ergocalciferol) 50000 Unit Caps (Ergocalciferol) .... Take 1 Capsule By Mouth Once Per Week  Allergies (verified): 1)  ! Sulfa 2)  ! * Topamax  Past History:  Past Medical History: Anemia-iron deficiency Depression Hyperlipidemia Hypertension Anxiety Gallstones Fibromyalgia  Past Surgical History: 2000 exp lap for infertility 2001 gallbladder surgery 1989 tonsillectomy  Family History: father: DM, bipolar mother: high cholesterol, atrial fibrillation, ? melanoma sister: clear cell sarcoma in leg, hypothyroid MGM: Afib, HTN PGM: arthritis No FH of Colon Cancer:  Social History: Reviewed history from 05/30/2008 and no changes required. Occupation:teaches preschool Married 1 child, 2 step children: asthma, ADHD Never Smoked Alcohol use-yes, 4 times a month was married to alcoholic in past Drug use-no Regular exercise-yes 3-4 times a week Diet:  has tried weight watchers, [place] craig 3 meals a day, fruit and veggies, water, some  caffeine a day  Review of Systems       The patient complains of anxiety-new, breast changes/lumps, change in vision, fatigue, headaches-new, muscle pains/cramps, skin rash, sore throat, swollen lymph glands, thirst - excessive, and urination - excessive.  The patient denies allergy/sinus, anemia,  arthritis/joint pain, back pain, blood in urine, confusion, cough, coughing up blood, depression-new, fainting, fever, hearing problems, heart murmur, heart rhythm changes, itching, menstrual pain, night sweats, nosebleeds, pregnancy symptoms, shortness of breath, sleeping problems, swelling of feet/legs, thirst - excessive , urination - excessive , urination changes/pain, urine leakage, vision changes, and voice change.         The patient also complains of multiple episodes of diaphoresis.  All other systems were reviewed and were negative   Vital Signs:  Patient profile:   45 year old female Height:      63.25 inches Weight:      226.25 pounds BMI:     39.91 BSA:     2.05 Pulse rate:   104 / minute Pulse rhythm:   regular BP sitting:   108 / 80  (right arm)  Vitals Entered By: Lamona Curl CMA Duncan Dull) (July 22, 2010 3:23 PM)  Physical Exam  Additional Exam:  On physical exam she is an obese female  skin: anicteric HEENT: normocephalic; PEERLA; no nasal or pharyngeal abnormalities neck: supple nodes: no cervical lymphadenopathy chest: clear to ausculatation and percussion heart: no murmurs, gallops, or rubs abd: soft, nontender; BS normoactive; no abdominal masses, tenderness, organomegaly; there is no succussion splash rectal: deferred ext: no cynanosis, clubbing, edema skeletal: no deformities neuro: oriented x 3; no focal abnormalities    Impression & Recommendations:  Problem # 1:  ? of GERD (ICD-530.81)  Patient's globus could be related to acid reflux with a possible early esophageal stricture.  Alternatively, this could be anxiety-related.  Regurgitation of gastric contents certainly suggest that she is having some degree of reflux.  Recommendations #1 continue Prilosec #2 upper endoscopy with dilatation as indicated  Risks, alternatives, and complications of the procedure, including bleeding, perforation, and possible need for surgery, were explained  to the patient.  Patient's questions were answered.  Orders: EGD (EGD)  Patient Instructions: 1)  Copy sent to : Kerby Nora, MD 2)  Your EGD is scheduled for 08/26/2010 at 3pm 3)  The medication list was reviewed and reconciled.  All changed / newly prescribed medications were explained.  A complete medication list was provided to the patient / caregiver. 4)  Conscious Sedation brochure given.  5)  Upper Endoscopy with Dilatation brochure given.

## 2010-11-04 NOTE — Letter (Signed)
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         Sincerely,    Oda Cogan RN

## 2010-11-04 NOTE — Assessment & Plan Note (Signed)
Summary: DISCUSS MEDICATIONS/CLE   Vital Signs:  Patient profile:   45 year old female Height:      63.25 inches Weight:      212.0 pounds BMI:     37.39 Temp:     98.8 degrees F oral Pulse rate:   80 / minute Pulse rhythm:   regular BP sitting:   90 / 60  (left arm) Cuff size:   large  Vitals Entered By: Benny Lennert CMA Duncan Dull) (April 01, 2010 9:44 AM)  History of Present Illness: Chief complaint discuss medication  Derm felt rash due to photosensitivity to sun.  Migraines..per headache center.  BBlocker..improved on this, maxalt as needed. Plans on botox injections for migraines.   On vit D. Using tens Unit on shoulder.   ORTHO/ Dr. Reola Calkins and Dr. Corliss Skains..B knee injections...using diclofenac. Clonozepam for fibromyalgia.  Anxiety...fluoxetine  10 mg daily helped but still not well controlled.  Stress related eating.  Anxious, not sleeping at night. Has seen pschychiatrist in past...tried many meds in past wellbutrin, effexor,cymbalta, lyrica,  Not tried amitryptiline. Counseling has not helped in past.  Father with bipolar.  No mania.   Has CPX at GYN.    Problems Prior to Update: 1)  Gastroenteritis, Viral  (ICD-008.8) 2)  Pharyngitis, Viral  (ICD-462) 3)  Sinusitis, Acute  (ICD-461.9) 4)  Dermatitis, Allergic  (ICD-692.9) 5)  Abdominal Pain, Left Lower Quadrant  (ICD-789.04) 6)  Acne Vulgaris  (ICD-706.1) 7)  Folliculitis  (ICD-704.8) 8)  Ganglion Cyst, Wrist, Left  (ICD-727.41) 9)  Prediabetes  (ICD-790.29) 10)  Screening For Lipoid Disorders  (ICD-V77.91) 11)  Foot Pain, Left  (ICD-729.5) 12)  Obesity, Unspecified  (ICD-278.00) 13)  Hx of Cardiac Murmur  (ICD-785.2) 14)  Hypertension  (ICD-401.9) 15)  Hyperlipidemia  (ICD-272.4) 16)  Common Migraine  (ICD-346.10) 17)  Fibromyalgia  (ICD-729.1) 18)  Premenstrual Dysphoric Syndrome  (ICD-625.4) 19)  Depression  (ICD-311) 20)  Anemia-iron Deficiency  (ICD-280.9) 21)  Asthma, Exercise Induced   (ICD-493.81)  Current Medications (verified): 1)  Kls Allerclear D-24hr 10-240 Mg Xr24h-Tab (Loratadine-Pseudoephedrine) .... Take 1 Tablet By Mouth Once A Day 2)  Klonopin 1 Mg Tabs (Clonazepam) .... Take 1 Tab By Mouth At Bedtime 3)  Simvastatin 20 Mg Tabs (Simvastatin) .Marland Kitchen.. 1 Tab By Mouth Daily 4)  Camila 0.35 Mg Tabs (Norethindrone (Contraceptive)) .... Take 1 Tablet By Mouth Once A Day 5)  Fluoxetine Hcl 20 Mg Tabs (Fluoxetine Hcl) .... Take 1 Tablet By Mouth Once A Day 6)  Maxalt 10 Mg Tabs (Rizatriptan Benzoate) .... For Migraines 7)  Triamcinolone Acetonide 0.5 % Crea (Triamcinolone Acetonide) .... Aaa Two Times A Day 8)  Tizanidine Hcl 4 Mg Tabs (Tizanidine Hcl) .... Take One Tablet Every 8 Hours Do Not Exceed 3 in 24 Houts 9)  Diclofenac Sodium 75 Mg Tbec (Diclofenac Sodium) .... One By Mouth 2 Times Daily Wiht Meals 10)  Atenolol 50 Mg Tabs (Atenolol) .... One Tablet Daily  Allergies: 1)  ! Sulfa 2)  ! * Topamax  Past History:  Past medical, surgical, family and social histories (including risk factors) reviewed, and no changes noted (except as noted below).  Past Medical History: Reviewed history from 05/30/2008 and no changes required. Anemia-iron deficiency Depression Hyperlipidemia Hypertension  Past Surgical History: Reviewed history from 05/30/2008 and no changes required. 2000 exp lap for infertility 2001 gallbladder surgery 1989 tosillectomy  Family History: Reviewed history from 05/30/2008 and no changes required. father: DM, bipolar mother: high cholesterol, atrial fibrillation, ? melanoma  sister: clear cell sarcoma in leg, hypothyroid MGM: Afib, HTN PGM: arthritis  Social History: Reviewed history from 05/30/2008 and no changes required. Occupation:teaches preschool Married 1 child, 2 step children: asthma, ADHD Never Smoked Alcohol use-yes, 4 times a month was married to alcoholic in past Drug use-no Regular exercise-yes 3-4 times a  week Diet:  has tried weight watchers, [place] craig 3 meals a day, fruit and veggies, water, some caffeine a day  Review of Systems General:  Complains of fatigue; denies fever. CV:  Denies chest pain or discomfort. Resp:  Denies shortness of breath. GI:  Denies abdominal pain. GU:  Denies dysuria.  Physical Exam  General:  obese appearing female in NAd Mouth:  Oral mucosa and oropharynx without lesions or exudates.  Teeth in good repair. Neck:  no carotid bruit or thyromegaly no cervical or supraclavicular lymphadenopathy  Lungs:  Normal respiratory effort, chest expands symmetrically. Lungs are clear to auscultation, no crackles or wheezes. Heart:  Normal rate and regular rhythm. S1 and S2 normal without gallop, murmur, click, rub or other extra sounds. Abdomen:  Bowel sounds positive,abdomen soft and non-tender without masses, organomegaly or hernias noted. Pulses:  R and L posterior tibial pulses are full and equal bilaterally  Extremities:  no edema  Psych:  Oriented X3, memory intact for recent and remote, normally interactive, good eye contact, and slightly anxious.     Impression & Recommendations:  Problem # 1:  DEPRESSION/ANXIETY (ICD-300.4) Poor control...interfering with functioning and other health issues. Increase dose of fluoxetine. Limit xanax use to as needed. Declined counsleing as offered currently.  Close follow up. NO SI/HI.  Problem # 2:  HYPERTENSION (ICD-401.9) Well controlled. Continue current medication.  Her updated medication list for this problem includes:    Atenolol 50 Mg Tabs (Atenolol) ..... One tablet daily  BP today: 90/60 Prior BP: 110/70 (12/20/2009)  Prior 10 Yr Risk Heart Disease: Not enough information (05/02/2009)  Labs Reviewed: K+: 3.5 (01/23/2009) Creat: : 1.0 (01/23/2009)   Chol: 211 (04/25/2009)   HDL: 53.50 (04/25/2009)   LDL: DEL (10/23/2008)   TG: 163.0 (04/25/2009)  Problem # 3:  FIBROMYALGIA (ICD-729.1) Per Rheum  ([name])...poor control. The following medications were removed from the medication list:    Methocarbamol 500 Mg Tabs (Methocarbamol) ..... One tablet one to three times as needed. Her updated medication list for this problem includes:    Tizanidine Hcl 4 Mg Tabs (Tizanidine hcl) .Marland Kitchen... Take one tablet every 8 hours do not exceed 3 in 24 houts    Diclofenac Sodium 75 Mg Tbec (Diclofenac sodium) ..... One by mouth 2 times daily wiht meals  Problem # 4:  KNEE PAIN, BILATERAL (ICD-719.46) ORTHO (Dr. Cleophas Dunker)...some improvement with cortisone injections.  The following medications were removed from the medication list:    Methocarbamol 500 Mg Tabs (Methocarbamol) ..... One tablet one to three times as needed. Her updated medication list for this problem includes:    Tizanidine Hcl 4 Mg Tabs (Tizanidine hcl) .Marland Kitchen... Take one tablet every 8 hours do not exceed 3 in 24 houts    Diclofenac Sodium 75 Mg Tbec (Diclofenac sodium) ..... One by mouth 2 times daily wiht meals  Problem # 5:  Preventive Health Care (ICD-V70.0) Assessment: Comment Only The patient's preventative maintenance and recommended screening tests for an annual wellness exam were reviewed in full today. Brought up to date unless services declined.  Counselled on the importance of diet, exercise, and its role in overall health and mortality. The patient's FH  and SH was reviewed, including their home life, tobacco status, and drug and alcohol status.     Complete Medication List: 1)  Kls Allerclear D-24hr 10-240 Mg Xr24h-tab (Loratadine-pseudoephedrine) .... Take 1 tablet by mouth once a day 2)  Klonopin 1 Mg Tabs (Clonazepam) .... Take 1 tab by mouth at bedtime 3)  Simvastatin 20 Mg Tabs (Simvastatin) .Marland Kitchen.. 1 tab by mouth daily 4)  Camila 0.35 Mg Tabs (Norethindrone (contraceptive)) .... Take 1 tablet by mouth once a day 5)  Fluoxetine Hcl 20 Mg Tabs (Fluoxetine hcl) .... Take 1 tablet by mouth once a day 6)  Maxalt 10 Mg Tabs  (Rizatriptan benzoate) .... For migraines 7)  Triamcinolone Acetonide 0.5 % Crea (Triamcinolone acetonide) .... Aaa two times a day 8)  Tizanidine Hcl 4 Mg Tabs (Tizanidine hcl) .... Take one tablet every 8 hours do not exceed 3 in 24 houts 9)  Diclofenac Sodium 75 Mg Tbec (Diclofenac sodium) .... One by mouth 2 times daily wiht meals 10)  Atenolol 50 Mg Tabs (Atenolol) .... One tablet daily  Patient Instructions: 1)  Increase to fluoxetine 20 mg daily. 2)   Follow up in 3 -4 weeks.  Prescriptions: FLUOXETINE HCL 20 MG TABS (FLUOXETINE HCL) Take 1 tablet by mouth once a day  #30 x 11   Entered and Authorized by:   Kerby Nora MD   Signed by:   Kerby Nora MD on 04/01/2010   Method used:   [facility]        [facility] (retail)       [address]       [place], [place]  [address]       Ph: [phone] or [phone]       Fax: [phone]   RxID:   [id number]   Current Allergies (reviewed today): ! SULFA ! * TOPAMAX

## 2010-11-04 NOTE — Assessment & Plan Note (Signed)
Summary: 30 MIN APPT 3-4 WEEK FOLLOW UP/[facility]   Vital Signs:  Patient profile:   45 year old female Height:      63.25 inches Weight:      218.8 pounds BMI:     38.59 Temp:     99.0 degrees F oral Pulse rate:   80 / minute Pulse rhythm:   regular BP sitting:   118 / 80  (left arm) Cuff size:   large  Vitals Entered By: Benny Lennert CMA Duncan Dull) (April 22, 2010 4:39 PM)  History of Present Illness: Chief complaint 3-4 week follow up   Depression..on higher dose of fluoxetine x 3-4 weeks. Since higher dose..she has been having insomnia. She feels minimal improvement.   Lactating left breast...GYN said nml prolactin...sending for brain MRI anyway.  Seeing endo for weight issues and prediabetes...on metformin two times a day.  Seeing nutritionist. Yoga and exercising regularly.      Lipid Management History:      Positive NCEP/ATP III risk factors include hypertension.  Negative NCEP/ATP III risk factors include female age less than 44 years old and non-tobacco-user status.        Her compliance with the TLC diet is fair.  The patient expresses understanding of adjunctive measures for cholesterol lowering.  Adjunctive measures started by the patient include aerobic exercise, fiber, niacin, limit alcohol consumpton, and weight reduction.     Problems Prior to Update: 1)  Knee Pain, Bilateral  (ICD-719.46) 2)  Gastroenteritis, Viral  (ICD-008.8) 3)  Pharyngitis, Viral  (ICD-462) 4)  Sinusitis, Acute  (ICD-461.9) 5)  Dermatitis, Allergic  (ICD-692.9) 6)  Abdominal Pain, Left Lower Quadrant  (ICD-789.04) 7)  Acne Vulgaris  (ICD-706.1) 8)  Folliculitis  (ICD-704.8) 9)  Ganglion Cyst, Wrist, Left  (ICD-727.41) 10)  Prediabetes  (ICD-790.29) 11)  Screening For Lipoid Disorders  (ICD-V77.91) 12)  Foot Pain, Left  (ICD-729.5) 13)  Obesity, Unspecified  (ICD-278.00) 14)  Hx of Cardiac Murmur  (ICD-785.2) 15)  Hypertension  (ICD-401.9) 16)  Hyperlipidemia  (ICD-272.4) 17)   Common Migraine  (ICD-346.10) 18)  Fibromyalgia  (ICD-729.1) 19)  Premenstrual Dysphoric Syndrome  (ICD-625.4) 20)  Depression/anxiety  (ICD-300.4) 21)  Anemia-iron Deficiency  (ICD-280.9) 22)  Asthma, Exercise Induced  (ICD-493.81)  Current Medications (verified): 1)  Kls Allerclear D-24hr 10-240 Mg Xr24h-Tab (Loratadine-Pseudoephedrine) .... Take 1 Tablet By Mouth Once A Day 2)  Klonopin 1 Mg Tabs (Clonazepam) .... Take 1 Tab By Mouth At Bedtime 3)  Simvastatin 20 Mg Tabs (Simvastatin) .Marland Kitchen.. 1 Tab By Mouth Daily 4)  Camila 0.35 Mg Tabs (Norethindrone (Contraceptive)) .... Take 1 Tablet By Mouth Once A Day 5)  Fluoxetine Hcl 10 Mg Caps (Fluoxetine Hcl) .Marland Kitchen.. 1 Tab By Mouth Daily 6)  Maxalt 10 Mg Tabs (Rizatriptan Benzoate) .... For Migraines 7)  Triamcinolone Acetonide 0.5 % Crea (Triamcinolone Acetonide) .... Aaa Two Times A Day 8)  Tizanidine Hcl 4 Mg Tabs (Tizanidine Hcl) .... Take One Tablet Every 8 Hours Do Not Exceed 3 in 24 Houts 9)  Diclofenac Sodium 75 Mg Tbec (Diclofenac Sodium) .... One By Mouth 2 Times Daily Wiht Meals 10)  Atenolol 50 Mg Tabs (Atenolol) .... One Tablet Daily 11)  Amitriptyline Hcl 25 Mg Tabs (Amitriptyline Hcl) .Marland Kitchen.. 1-2 Tab By Mouth At Bedtime  Allergies: 1)  ! Sulfa 2)  ! * Topamax  Past History:  Past medical, surgical, family and social histories (including risk factors) reviewed, and no changes noted (except as noted below).  Past Medical History: Reviewed  history from 05/30/2008 and no changes required. Anemia-iron deficiency Depression Hyperlipidemia Hypertension  Past Surgical History: Reviewed history from 05/30/2008 and no changes required. 2000 exp lap for infertility 2001 gallbladder surgery 1989 tosillectomy  Family History: Reviewed history from 05/30/2008 and no changes required. father: DM, bipolar mother: high cholesterol, atrial fibrillation, ? melanoma sister: clear cell sarcoma in leg, hypothyroid MGM: Afib, HTN PGM:  arthritis  Social History: Reviewed history from 05/30/2008 and no changes required. Occupation:teaches preschool Married 1 child, 2 step children: asthma, ADHD Never Smoked Alcohol use-yes, 4 times a month was married to alcoholic in past Drug use-no Regular exercise-yes 3-4 times a week Diet:  has tried weight watchers, [place] craig 3 meals a day, fruit and veggies, water, some caffeine a day  Review of Systems General:  Complains of fatigue; denies fever. CV:  Denies chest pain or discomfort. Resp:  Denies shortness of breath. Psych:  Denies alternate hallucination ( auditory/visual), suicidal thoughts/plans, and thoughts /plans of harming others.  Physical Exam  General:  obese appearing female in NAD Mouth:  Oral mucosa and oropharynx without lesions or exudates.  Teeth in good repair. Neck:  no carotid bruit or thyromegaly no cervical or supraclavicular lymphadenopathy  Lungs:  Normal respiratory effort, chest expands symmetrically. Lungs are clear to auscultation, no crackles or wheezes. Heart:  Normal rate and regular rhythm. S1 and S2 normal without gallop, murmur, click, rub or other extra sounds. Psych:  Oriented X3, memory intact for recent and remote, normally interactive, good eye contact, and slightly anxious.    IMproved affect from last OV.    Impression & Recommendations:  Problem # 1:  DEPRESSION/ANXIETY (ICD-300.4) Decrease back to 10 mg fluoxetine due to SE.   Start amitryptiline nighty at bedtime..start with 1 tablet, may increase to 2 tabs if not improving.  Please schedule a follow-up appointment in 1 month 30 min OV.   Problem # 2:  HYPERLIPIDEMIA (ICD-272.4) Reviewed labs. Start omega 3 in flax seed form to decrease triglycerides. Encouraged exercise, weight loss, healthy eating habits.  Her updated medication list for this problem includes:    Simvastatin 20 Mg Tabs (Simvastatin) .Marland Kitchen... 1 tab by mouth daily  Labs Reviewed: SGOT: 20 (04/18/2010)    SGPT: 20 (04/18/2010)  Lipid Goals: Chol Goal: 200 (05/02/2009)   HDL Goal: 40 (05/02/2009)   LDL Goal: 130 (05/02/2009)   TG Goal: 150 (05/02/2009)  Prior 10 Yr Risk Heart Disease: Not enough information (05/02/2009)   HDL:47.30 (04/18/2010), 53.50 (04/25/2009)  LDL:DEL (10/23/2008)  Chol:202 (04/18/2010), 211 (04/25/2009)  Trig:197.0 (04/18/2010), 163.0 (04/25/2009)  Complete Medication List: 1)  Kls Allerclear D-24hr 10-240 Mg Xr24h-tab (Loratadine-pseudoephedrine) .... Take 1 tablet by mouth once a day 2)  Klonopin 1 Mg Tabs (Clonazepam) .... Take 1 tab by mouth at bedtime 3)  Simvastatin 20 Mg Tabs (Simvastatin) .Marland Kitchen.. 1 tab by mouth daily 4)  Camila 0.35 Mg Tabs (Norethindrone (contraceptive)) .... Take 1 tablet by mouth once a day 5)  Fluoxetine Hcl 10 Mg Caps (Fluoxetine hcl) .Marland Kitchen.. 1 tab by mouth daily 6)  Maxalt 10 Mg Tabs (Rizatriptan benzoate) .... For migraines 7)  Triamcinolone Acetonide 0.5 % Crea (Triamcinolone acetonide) .... Aaa two times a day 8)  Tizanidine Hcl 4 Mg Tabs (Tizanidine hcl) .... Take one tablet every 8 hours do not exceed 3 in 24 houts 9)  Diclofenac Sodium 75 Mg Tbec (Diclofenac sodium) .... One by mouth 2 times daily wiht meals 10)  Atenolol 50 Mg Tabs (Atenolol) .... One tablet  daily 11)  Amitriptyline Hcl 25 Mg Tabs (Amitriptyline hcl) .Marland Kitchen.. 1-2 tab by mouth at bedtime  Lipid Assessment/Plan:      Based on NCEP/ATP III, the patient's risk factor category is "0-1 risk factors".  The patient's lipid goals are as follows: Total cholesterol goal is 200; LDL cholesterol goal is 130; HDL cholesterol goal is 40; Triglyceride goal is 150.  Her LDL cholesterol goal has not been met.    Patient Instructions: 1)  Decrease back to 10 mg fluoxetine. 2)   Start amitryptiline nighty at bedtime..start with 1 tablet, may increase to 2 tabs if not improving.  3)  Please schedule a follow-up appointment in 1 month 30 min OV.  4)  Flax seed oil... 2000 mg omega threes divided  daily Prescriptions: AMITRIPTYLINE HCL 25 MG TABS (AMITRIPTYLINE HCL) 1-2 tab by mouth at bedtime  #60 x 11   Entered and Authorized by:   Kerby Nora MD   Signed by:   Kerby Nora MD on 04/22/2010   Method used:   [facility]        [id number]       [address]       [place], [place]  [address]       Ph: [phone] or [phone]       Fax: [phone]   RxID:   [id number] FLUOXETINE HCL 10 MG CAPS (FLUOXETINE HCL) 1 tab by mouth daily  #30 x 11   Entered and Authorized by:   Kerby Nora MD   Signed by:   Kerby Nora MD on 04/22/2010   Method used:   [facility]        [facility]  [place] ([facility]ail)       [address]       [place], [place]  [address]       Ph: [phone] or [phone]       Fax: [phone]   RxID:   [id number]   Current Allergies (reviewed today): ! SULFA ! * TOPAMAX  Last LDL:  DEL (10/23/2008 9:57:00 AM) LDL Result Date:  04/18/2010 LDL Result:  135 LDL Next Due:  6 mo

## 2010-11-04 NOTE — Progress Notes (Signed)
Summary: rash and tingling  Phone Note Call from Patient Call back at Home Phone [phone]   Caller: Patient Call For: Kerby Nora MD Summary of Call: Patient says that she took her kids to the pool today and that she broke out into a rash on her arms and hands. Her arms, feet, hands and shoulders feel tingly "as if they were asleep". Patient says that this happened back at the end of April when they were at the beach and she was sitting in the sun. She says that she called the pharmacy to see if any of her meds would make her react this way to the sun. They told her that none of them should so now she is a little worried. She has not other symptoms, but want to know if she needs to be seen or what she should do. Please advise. [facility].  Initial call taken by: Melody Comas,  March 17, 2010 2:16 PM  Follow-up for Phone Call        Likely reaction to sun..can have almost alleric response to heat and sun...even not on photosensitive meds. Recommend topical steroid and oral antihistamine (Claritin/zyrtec). If not improving in 5-7 days  make appt to be seen or  refer to Derm.   Additional Follow-up for Phone Call Additional follow up Details #1::        Patient advised.Consuello Masse CMA   Additional Follow-up by: Benny Lennert CMA Duncan Dull),  March 17, 2010 3:19 PM    New/Updated Medications: TRIAMCINOLONE ACETONIDE 0.5 % CREA (TRIAMCINOLONE ACETONIDE) AAA two times a day Prescriptions: TRIAMCINOLONE ACETONIDE 0.5 % CREA (TRIAMCINOLONE ACETONIDE) AAA two times a day  #30-60gm x 0   Entered and Authorized by:   Kerby Nora MD   Signed by:   Kerby Nora MD on 03/17/2010   Method used:   [facility]        [facility] (retail)       [address]       [place], [place]  [address]       Ph: [phone] or [phone]       Fax: [phone]   RxID:   [id number]

## 2010-11-04 NOTE — Progress Notes (Signed)
Summary: Not improving  Phone Note Call from Patient Call back at Home Phone [phone]   Caller: Patient Call For: Kerby Nora MD Summary of Call: Patient says she phoned in earlier this week about her ? reaction to the sun.  She has used the cream that was prescribed and antihistamines but it is not resolving or even improving.  Please advise. Initial call taken by: Delilah Shan CMA Duncan Dull),  March 21, 2010 9:34 AM  Follow-up for Phone Call        MAke appt for her early next week.  or refer to Derm if she prefers.  Follow-up by: Kerby Nora MD,  March 21, 2010 9:43 AM  Additional Follow-up for Phone Call Additional follow up Details #1::        Patient says she has a dermatologist and she will call them and get an appt.Consuello Masse CMA   Additional Follow-up by: Benny Lennert CMA Duncan Dull),  March 21, 2010 10:11 AM

## 2010-11-04 NOTE — Assessment & Plan Note (Signed)
Summary: 15 MIN APPT ? STREP/NT   Vital Signs:  Patient profile:   45 year old female Height:      63.25 inches Weight:      218.4 pounds BMI:     38.52 Temp:     97.8 degrees F oral Pulse rate:   88 / minute Pulse rhythm:   regular BP sitting:   110 / 70  (left arm) Cuff size:   large  Vitals Entered By: Benny Lennert CMA Duncan Dull) (December 20, 2009 10:12 AM)  History of Present Illness: Chief complaint ? strep throat  Acute Visit History:      The patient complains of diarrhea, earache, sore throat, and vomiting.  These symptoms began 2 days ago.  She denies cough, fever, and sinus problems.  Other comments include: fatigue red rash on chest..resolved now. .        The earache is located on the right side.        Problems Prior to Update: 1)  Sinusitis, Acute  (ICD-461.9) 2)  Dermatitis, Allergic  (ICD-692.9) 3)  Abdominal Pain, Left Lower Quadrant  (ICD-789.04) 4)  Acne Vulgaris  (ICD-706.1) 5)  Folliculitis  (ICD-704.8) 6)  Ganglion Cyst, Wrist, Left  (ICD-727.41) 7)  Prediabetes  (ICD-790.29) 8)  Screening For Lipoid Disorders  (ICD-V77.91) 9)  Foot Pain, Left  (ICD-729.5) 10)  Obesity, Unspecified  (ICD-278.00) 11)  Hx of Cardiac Murmur  (ICD-785.2) 12)  Hypertension  (ICD-401.9) 13)  Hyperlipidemia  (ICD-272.4) 14)  Common Migraine  (ICD-346.10) 15)  Fibromyalgia  (ICD-729.1) 16)  Premenstrual Dysphoric Syndrome  (ICD-625.4) 17)  Depression  (ICD-311) 18)  Anemia-iron Deficiency  (ICD-280.9) 19)  Asthma, Exercise Induced  (ICD-493.81)  Current Medications (verified): 1)  Kls Allerclear D-24hr 10-240 Mg Xr24h-Tab (Loratadine-Pseudoephedrine) .... Take 1 Tablet By Mouth Once A Day 2)  Klonopin 1 Mg Tabs (Clonazepam) .... Take 1 Tab By Mouth At Bedtime 3)  Simvastatin 20 Mg Tabs (Simvastatin) .Marland Kitchen.. 1 Tab By Mouth Daily 4)  Meclizine Hcl 25 Mg Tabs (Meclizine Hcl) .... Take 1 Tablet By Mouth Every 6 Hours As Needed For Headache. 5)  Camila 0.35 Mg Tabs (Norethindrone  (Contraceptive)) .... Take 1 Tablet By Mouth Once A Day 6)  Methocarbamol 500 Mg Tabs (Methocarbamol) .... One Tablet One To Three Times As Needed. 7)  Fluoxetine Hcl 10 Mg Tabs (Fluoxetine Hcl) .... Take 1 Tablet By Mouth Once A Day 8)  Maxalt 10 Mg Tabs (Rizatriptan Benzoate) .... For Migraines  Allergies: 1)  ! Sulfa 2)  ! * Topamax  Past History:  Past medical, surgical, family and social histories (including risk factors) reviewed, and no changes noted (except as noted below).  Past Medical History: Reviewed history from 05/30/2008 and no changes required. Anemia-iron deficiency Depression Hyperlipidemia Hypertension  Past Surgical History: Reviewed history from 05/30/2008 and no changes required. 2000 exp lap for infertility 2001 gallbladder surgery 1989 tosillectomy  Family History: Reviewed history from 05/30/2008 and no changes required. father: DM, bipolar mother: high cholesterol, atrial fibrillation, ? melanoma sister: clear cell sarcoma in leg, hypothyroid MGM: Afib, HTN PGM: arthritis  Social History: Reviewed history from 05/30/2008 and no changes required. Occupation:teaches preschool Married 1 child, 2 step children: asthma, ADHD Never Smoked Alcohol use-yes, 4 times a month was married to alcoholic in past Drug use-no Regular exercise-yes 3-4 times a week Diet:  has tried weight watchers, jenny craig 3 meals a day, fruit and veggies, water, some caffeine a day  Review of Systems General:  Complains of fatigue. Resp:  Denies shortness of breath, sputum productive, and wheezing.  Physical Exam  General:  alert, well-developed, well-nourished, well-hydrated, and overweight-appearing.  NAD Head:  no maxillary sinus ttp Ears:  External ear exam shows no significant lesions or deformities.  Otoscopic examination reveals clear canals, tympanic membranes are intact bilaterally without bulging, retraction, inflammation or discharge. Hearing is grossly  normal bilaterally. Nose:  nasal dischargemucosal pallor.    Mouth:  good dentition and pharynx pink and moist.  No tonsils, no exudate Neck:  no cervical or supraclavicular lymphadenopathy no carotid bruit or thyromegaly  Lungs:  Normal respiratory effort, chest expands symmetrically. Lungs are clear to auscultation, no crackles or wheezes. Heart:  Normal rate and regular rhythm. S1 and S2 normal without gallop, murmur, click, rub or other extra sounds.   Impression & Recommendations:  Problem # 1:  PHARYNGITIS, VIRAL (ICD-462)  Symptomatic care. Tylenol for throat tenderness.  The following medications were removed from the medication list:    Doxycycline Hyclate 100 Mg Tabs (Doxycycline hyclate) .Marland Kitchen... Take 1 tablet by mouth two times a day  Orders: Rapid Strep (29562)  Problem # 2:  GASTROENTERITIS, VIRAL (ICD-008.8) Push fluids. on furhter emesis. Return to regular dietas soon as able.   Complete Medication List: 1)  Kls Allerclear D-24hr 10-240 Mg Xr24h-tab (Loratadine-pseudoephedrine) .... Take 1 tablet by mouth once a day 2)  Klonopin 1 Mg Tabs (Clonazepam) .... Take 1 tab by mouth at bedtime 3)  Simvastatin 20 Mg Tabs (Simvastatin) .Marland Kitchen.. 1 tab by mouth daily 4)  Meclizine Hcl 25 Mg Tabs (Meclizine hcl) .... Take 1 tablet by mouth every 6 hours as needed for headache. 5)  Camila 0.35 Mg Tabs (Norethindrone (contraceptive)) .... Take 1 tablet by mouth once a day 6)  Methocarbamol 500 Mg Tabs (Methocarbamol) .... One tablet one to three times as needed. 7)  Fluoxetine Hcl 10 Mg Tabs (Fluoxetine hcl) .... Take 1 tablet by mouth once a day 8)  Maxalt 10 Mg Tabs (Rizatriptan benzoate) .... For migraines  Current Allergies (reviewed today): ! SULFA ! * TOPAMAX  Laboratory Results    Other Tests  Rapid Strep: negative  Kit Test Internal QC: Negative   (Normal Range: Negative)

## 2010-11-04 NOTE — Assessment & Plan Note (Signed)
Summary: COUGH/MK   Vital Signs:  Patient profile:   45 year old female Height:      63.25 inches Weight:      220.25 pounds BMI:     38.85 Temp:     98.6 degrees F oral Pulse rate:   88 / minute Pulse rhythm:   regular BP sitting:   110 / 80  (left arm) Cuff size:   large  Vitals Entered By: Delilah Shan CMA Duncan Dull) (October 24, 2009 3:51 PM) CC: Cough, congestion   History of Present Illness: 45 yo with one week of productive cough, sinus pressure, chills, subjective fevers. Scratchy throat and ear pressure. No n/v/d. No rashes. No wheezing or SOB.   Taking Mucinex OTC.  Of note, derm agreed rash was allergic, still have not found trigger but rash is better.  Current Medications (verified): 1)  Flonase 50 Mcg/act Susp (Fluticasone Propionate) .... Two Sprays Each Nostril Once Daily 2)  Kls Allerclear D-24hr 10-240 Mg Xr24h-Tab (Loratadine-Pseudoephedrine) .... Take 1 Tablet By Mouth Once A Day 3)  Cyclobenzaprine Hcl 5 Mg Tabs (Cyclobenzaprine Hcl) .... Take 1 Tablet By Mouth Qhs As Needed 4)  Klonopin 1 Mg Tabs (Clonazepam) .... Take 1 Tab By Mouth At Bedtime 5)  Simvastatin 20 Mg Tabs (Simvastatin) .Marland Kitchen.. 1 Tab By Mouth Daily 6)  Doxycycline Hyclate 100 Mg Tabs (Doxycycline Hyclate) .... Take 1 Tablet By Mouth Two Times A Day 7)  Meclizine Hcl 25 Mg Tabs (Meclizine Hcl) .... Take 1 Tablet By Mouth Every 6 Hours As Needed For Headache. 8)  Camila 0.35 Mg Tabs (Norethindrone (Contraceptive)) .... Take 1 Tablet By Mouth Once A Day 9)  Promethazine Hcl 25 Mg Tabs (Promethazine Hcl) .Marland Kitchen.. 1 Tab By Mouth Q 6 Hours As Needed Nausea 10)  Methocarbamol 500 Mg Tabs (Methocarbamol) .... One Tablet One To Three Times As Needed. 11)  Lidex 0.05 % Crea (Fluocinonide) .... Apply Thin Layer To Rash 2-4 Times Daily For Itching 12)  Azithromycin 250 Mg  Tabs (Azithromycin) .... 2 By  Mouth Today and Then 1 Daily For 4 Days 13)  Cheratussin Ac 100-10 Mg/48ml Syrp (Guaifenesin-Codeine) .... 5  Ml At Bedtime For Cough. 14)  Diflucan 150 Mg Tabs (Fluconazole) .... Once Daily  Allergies: 1)  ! Sulfa 2)  ! * Topamax  Review of Systems      See HPI CV:  Denies chest pain or discomfort. Resp:  Complains of cough and sputum productive; denies shortness of breath and wheezing.  Physical Exam  General:  alert, well-developed, well-nourished, well-hydrated, and overweight-appearing.  NAD Nose:  nasal dischargemucosal pallor.   ethmoid sinuses TTP Mouth:  good dentition and pharynx pink and moist.   Lungs:  normal respiratory effort, no intercostal retractions, no accessory muscle use, and normal breath sounds.   Heart:  Normal rate and regular rhythm. S1 and S2 normal without gallop, murmur, click, rub or other extra sounds. Skin:  urticaria on arms, neck, face. Cervical Nodes:  No lymphadenopathy noted Psych:  normally interactive and slightly anxious.     Impression & Recommendations:  Problem # 1:  SINUSITIS, ACUTE (ICD-461.9) Assessment New Likely viral.  Given printed out prscription for [employer] to use only if symptoms worsen or dont improve in another 5 days.  Pt is in agreement with plan.  continue supportive care, given Chertussin for cough. Her updated medication list for this problem includes:    Flonase 50 Mcg/act Susp (Fluticasone propionate) .Marland Kitchen..Marland Kitchen Two sprays each nostril once daily  Kls Allerclear D-24hr 10-240 Mg Xr24h-tab (Loratadine-pseudoephedrine) .Marland Kitchen... Take 1 tablet by mouth once a day    Doxycycline Hyclate 100 Mg Tabs (Doxycycline hyclate) .Marland Kitchen... Take 1 tablet by mouth two times a day    Azithromycin 250 Mg Tabs (Azithromycin) .Marland Kitchen... 2 by  mouth today and then 1 daily for 4 days    Cheratussin Ac 100-10 Mg/71ml Syrp (Guaifenesin-codeine) .Marland KitchenMarland KitchenMarland KitchenMarland Kitchen 5 ml at bedtime for cough.  Complete Medication List: 1)  Flonase 50 Mcg/act Susp (Fluticasone propionate) .... Two sprays each nostril once daily 2)  Kls Allerclear D-24hr 10-240 Mg Xr24h-tab  (Loratadine-pseudoephedrine) .... Take 1 tablet by mouth once a day 3)  Cyclobenzaprine Hcl 5 Mg Tabs (Cyclobenzaprine hcl) .... Take 1 tablet by mouth qhs as needed 4)  Klonopin 1 Mg Tabs (Clonazepam) .... Take 1 tab by mouth at bedtime 5)  Simvastatin 20 Mg Tabs (Simvastatin) .Marland Kitchen.. 1 tab by mouth daily 6)  Doxycycline Hyclate 100 Mg Tabs (Doxycycline hyclate) .... Take 1 tablet by mouth two times a day 7)  Meclizine Hcl 25 Mg Tabs (Meclizine hcl) .... Take 1 tablet by mouth every 6 hours as needed for headache. 8)  Camila 0.35 Mg Tabs (Norethindrone (contraceptive)) .... Take 1 tablet by mouth once a day 9)  Promethazine Hcl 25 Mg Tabs (Promethazine hcl) .Marland Kitchen.. 1 tab by mouth q 6 hours as needed nausea 10)  Methocarbamol 500 Mg Tabs (Methocarbamol) .... One tablet one to three times as needed. 11)  Lidex 0.05 % Crea (Fluocinonide) .... Apply thin layer to rash 2-4 times daily for itching 12)  Azithromycin 250 Mg Tabs (Azithromycin) .... 2 by  mouth today and then 1 daily for 4 days 13)  Cheratussin Ac 100-10 Mg/60ml Syrp (Guaifenesin-codeine) .... 5 ml at bedtime for cough. 14)  Diflucan 150 Mg Tabs (Fluconazole) .... Once daily  Patient Instructions: 1)  Take antibiotic as directed.  Drink lots of fluids.  Treat sympotmatically with Mucinex, nasal saline irrigation, and Tylenol/Ibuprofen. You can also try using warm compresses.  Cough suppressant at night. Call if not improving as expected in 5-7 days.  Prescriptions: DIFLUCAN 150 MG TABS (FLUCONAZOLE) once daily  #1 x 0   Entered and Authorized by:   Ruthe Mannan MD   Signed by:   Ruthe Mannan MD on 10/24/2009   Method used:   Print then Give to Patient   RxID:   [id number] CHERATUSSIN AC 100-10 MG/5ML SYRP (GUAIFENESIN-CODEINE) 5 ml at bedtime for cough.  #4 ounces x 0   Entered and Authorized by:   Ruthe Mannan MD   Signed by:   Ruthe Mannan MD on 10/24/2009   Method used:   Print then Give to Patient   RxID:    [id number] AZITHROMYCIN 250 MG  TABS (AZITHROMYCIN) 2 by  mouth today and then 1 daily for 4 days  #6 x 0   Entered and Authorized by:   Ruthe Mannan MD   Signed by:   Ruthe Mannan MD on 10/24/2009   Method used:   Print then Give to Patient   RxID:   [id number]   Current Allergies (reviewed today): ! SULFA ! * TOPAMAX

## 2010-11-04 NOTE — Progress Notes (Signed)
Summary: still has rash   Phone Note Call from Patient Call back at Home Phone [phone]   Caller: Patient Call For: Kerby Nora MD Summary of Call: Came in last week with rash and was told to change shampoo, and get rid of dog bed. Has been taking Benadryl every three hours. Wants to know if something can be called in for the rash. [facility].  Initial call taken by: Melody Comas,  October 07, 2009 3:39 PM  Follow-up for Phone Call        [place].Marland Kitchensince you saw her can you answer this ? Follow-up by: Kerby Nora MD,  October 07, 2009 4:59 PM  Additional Follow-up for Phone Call Additional follow up Details #1::        I defintely did not tell her to get rid of her dog!!! I just told her I didn't know the trigger and it looked allergic.  Needs to go to derm if it's getting worse.  I can place referral. Additional Follow-up by: Ruthe Mannan MD,  October 08, 2009 6:50 AM     Appended Document: still has rash  Patient Advised.   Patient says she will make her own appointment with her dermatologist.

## 2010-11-04 NOTE — Letter (Signed)
Summary: EGD Instructions  [facility]  [address] [place], [place] [address]   Phone: [phone]  Fax: [phone]       Selena [name]    08-15-1966    MRN: [id number]       Procedure Day /Date:TUESDAY 08/26/2010     Arrival Time: 2PM     Procedure Time:3PM     Location of Procedure:                    X  [facility] (4th Floor)  PREPARATION FOR ENDOSCOPY/DIL   On11/22/2011 THE DAY OF THE PROCEDURE:  1.   No solid foods, milk or milk products are allowed after midnight the night before your procedure.  2.   Do not drink anything colored red or purple.  Avoid juices with pulp.  No orange juice.  3.  You may drink clear liquids until1PM, which is 2 hours before your procedure.                                                                                                CLEAR LIQUIDS INCLUDE: Water Jello Ice Popsicles Tea (sugar ok, no milk/cream) Powdered fruit flavored drinks Coffee (sugar ok, no milk/cream) Gatorade Juice: apple, white grape, white cranberry  Lemonade Clear bullion, consomm, broth Carbonated beverages (any kind) Strained chicken noodle soup Hard Candy   MEDICATION INSTRUCTIONS  Unless otherwise instructed, you should take regular prescription medications with a small sip of water as early as possible the morning of your procedure.           OTHER INSTRUCTIONS  You will need a responsible adult at least 43 years of age to accompany you and drive you home.   This person must remain in the waiting room during your procedure.  Wear loose fitting clothing that is easily removed.  Leave jewelry and other valuables at home.  However, you may wish to bring a book to read or an iPod/MP3 player to listen to music as you wait for your procedure to start.  Remove all body piercing jewelry and leave at home.  Total time from sign-in until discharge is approximately 2-3 hours.  You should go home directly after  your procedure and rest.  You can resume normal activities the day after your procedure.  The day of your procedure you should not:   Drive   Make legal decisions   Operate machinery   Drink alcohol   Return to work  You will receive specific instructions about eating, activities and medications before you leave.    The above instructions have been reviewed and explained to me by   _______________________    I fully understand and can verbalize these instructions _____________________________ Date _________

## 2010-11-04 NOTE — Letter (Signed)
Summary: Results Letter  [facility]  [address] [place], [place] [address]   Phone: [phone]  Fax: [phone]        July 22, 2010 MRN: [id number]    ABRIE EGLOFF [address] [place], [place]  [address]    Dear Ms. PUEBLA,  It is my pleasure to have treated you recently as a new patient in my office. I appreciate your confidence and the opportunity to participate in your care.  Since I do have a busy inpatient endoscopy schedule and office schedule, my office hours vary weekly. I am, however, available for emergency calls everyday through my office. If I am not available for an urgent office appointment, another one of our gastroenterologist will be able to assist you.  My well-trained staff are prepared to help you at all times. For emergencies after office hours, a physician from our Gastroenterology section is always available through my 24 hour answering service  Once again I welcome you as a new patient and I look forward to a happy and healthy relationship           Sincerely,  Louis Meckel MD  This letter has been electronically signed by your physician.  Appended Document: Results Letter LETTER MAILED

## 2010-11-04 NOTE — Letter (Signed)
Summary: Out of Work  [facility]  [address] [place], [place] [address]   Phone: [phone]  Fax: [phone]    August 26, 2010   Employee:  TRESIA REVOLORIO    To Whom It May Concern:   For Medical reasons, please excuse the above named employee from work for the following dates:  Start:    End:    If you need additional information, please feel free to contact our office.         Sincerely,    Oda Cogan RN

## 2010-11-04 NOTE — Progress Notes (Signed)
Summary: pain has increased  Phone Note Call from Patient Call back at Work Phone [phone]   Caller: Patient Call For: Kerby Nora MD Summary of Call: Pt is having a lot of pain in feet and knees when she gets up and down.  This has happened since she went off some of her pain meds.  She is coming in to see you at the end of next week and she is asking if there is something else you think she should try until then.  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  [facility]. Initial call taken by: Lowella Petties CMA, AAMA,  September 11, 2010 4:39 PM  Follow-up for Phone Call        Call in prescription for meloxicam 15 mg by mouth daily  #30, 0 RF. Cannot send in in EMR..note not completed. Follow-up by: Kerby Nora MD,  September 12, 2010 8:21 AM  Additional Follow-up for Phone Call Additional follow up Details #1::        [facility] [place].Consuello Masse CMA   Patient advised rx sent to pharmacy.Consuello Masse CMA    Additional Follow-up by: Benny Lennert CMA Duncan Dull),  September 12, 2010 8:33 AM

## 2010-11-04 NOTE — Progress Notes (Signed)
Summary: Rx for Prozac  Phone Note Call from Patient Call back at [phone]   Caller: Patient Call For: Selena Nora MD Summary of Call: Patient had a death in her family.  Is a little down and wanted to know if she could get a Rx [facility]ac [facility]  Says she has been on Prozac before and it worked well for her.  Please advise. Initial call taken by: Linde Gillis CMA Duncan Dull),  December 03, 2009 3:06 PM  Follow-up for Phone Call        Follow up if not improving in 1 month. Please verify no suicidal ideation. Follow-up by: Selena Nora MD,  December 03, 2009 3:13 PM  Additional Follow-up for Phone Call Additional follow up Details #1::        Patient advised and does not have any thoughts about hurting herself or anybody else Additional Follow-up by: Benny Lennert CMA Duncan Dull),  December 03, 2009 3:31 PM    New/Updated Medications: FLUOXETINE HCL 10 MG TABS (FLUOXETINE HCL) Take 1 tablet by mouth once a day Prescriptions: FLUOXETINE HCL 10 MG TABS (FLUOXETINE HCL) Take 1 tablet by mouth once a day  #30 x 3   Entered and Authorized by:   Selena Nora MD   Signed by:   Selena Nora MD on 12/03/2009   Method used:   [facility]        [facility]  [place] [facility]ail)       [address]       [place], [place]  [address]       Ph: [phone] or [phone]       Fax: [phone]   RxID:   [id number]

## 2010-11-04 NOTE — Progress Notes (Signed)
Summary: not any better  Phone Note Call from Patient Call back at Work Phone [phone]   Caller: Patient Call For: Dr. Para March Summary of Call: Pt is taking prilosec 20 mg's twice a day for ? reflux, but she still feels like she has something stuck in her throat.  She says the medicine is not helping at all and this has her concerned.  [facility]. Initial call taken by: Lowella Petties CMA,  July 21, 2010 8:43 AM  Follow-up for Phone Call        Then I would have patient follow up with GI per our plan at the [facility].  Please make arrangements.  Referral done.  Follow-up by: Crawford Givens MD,  July 21, 2010 8:49 AM  Additional Follow-up for Phone Call Additional follow up Details #1::        Patient notified as instructed by telephone. Informed patient that Shirlee Limerick will be in touch with her to get this referral scheduled. Additional Follow-up by: Sydell Axon LPN,  July 21, 2010 11:51 AM

## 2010-11-04 NOTE — Assessment & Plan Note (Signed)
Summary: follow up for back pain /alc   Vital Signs:  Patient profile:   45 year old female Height:      63.25 inches Weight:      225.0 pounds BMI:     39.69 Temp:     98.6 degrees F oral Pulse rate:   88 / minute Pulse rhythm:   regular BP sitting:   90 / 72  (left arm) Cuff size:   large  Vitals Entered By: Benny Lennert CMA Duncan Dull) (August 08, 2010 3:47 PM)  History of Present Illness: Chief complaint follow up back pain   Received cortisone shot from Dr. Corliss Skains...started on robaxin. Recommended Integrative therapies. Did not improve...until given valium last weekend..loosened neck up. Stopped valium few days ago..symtpms are coming back.  Felt great on this medicaiton. Helped amitryptiline and clonazepam. [facility] in [facility] for neck pain.Marland Kitchendx with muscle spasm/strain.  Feels like mood not doing well..up and down. Feels like coming unglued.  Irritable, angry. Depressed...in last 4-5 weeks.  Increase in stress. Sleeping okay at night.    Dysphagia.Marland KitchenHas appt for EGD per Arlyce Dice.  She feels she may be perimenopausal..sees GYN. Last period abut 2 months ago. Nausea and sweating.  Currently sexually acitve.  Problems Prior to Update: 1)  ? 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) 4)  Knee Pain, Bilateral  (ICD-719.46) 5)  Acne Vulgaris  (ICD-706.1) 6)  Prediabetes  (ICD-790.29) 7)  Screening For Lipoid Disorders  (ICD-V77.91) 8)  Obesity, Unspecified  (ICD-278.00) 9)  Hx of Cardiac Murmur  (ICD-785.2) 10)  Hypertension  (ICD-401.9) 11)  Hyperlipidemia  (ICD-272.4) 12)  Common Migraine  (ICD-346.10) 13)  Fibromyalgia  (ICD-729.1) 14)  Premenstrual Dysphoric Syndrome  (ICD-625.4) 15)  Depression/anxiety  (ICD-300.4) 16)  Anemia-iron Deficiency  (ICD-280.9) 17)  Asthma, Exercise Induced  (ICD-493.81)  Current Medications (verified): 1)  Kls Allerclear D-24hr 10-240 Mg Xr24h-Tab  (Loratadine-Pseudoephedrine) .... Take 1 Tablet By Mouth Once A Day 2)  Klonopin 1 Mg Tabs (Clonazepam) .... Take 1 Tab By Mouth At Bedtime 3)  Simvastatin 20 Mg Tabs (Simvastatin) .Marland Kitchen.. 1 Tab By Mouth Daily 4)  Camila 0.35 Mg Tabs (Norethindrone (Contraceptive)) .... Take 1 Tablet By Mouth Once A Day 5)  Fluoxetine Hcl 10 Mg Caps (Fluoxetine Hcl) .Marland Kitchen.. 1 Tab By Mouth Daily 6)  Maxalt 10 Mg Tabs (Rizatriptan Benzoate) .... As Needed For Migraines 7)  Triamcinolone Acetonide 0.5 % Crea (Triamcinolone Acetonide) .... Aaa Two Times A Day 8)  Tizanidine Hcl 4 Mg Tabs (Tizanidine Hcl) .... Take One Tablet Every 8 Hours Do Not Exceed 3 in 24 Houts 9)  Diclofenac Sodium 75 Mg Tbec (Diclofenac Sodium) .... One By Mouth 2 Times Daily With Meals As Needed 10)  Amitriptyline Hcl 25 Mg Tabs (Amitriptyline Hcl) .Marland Kitchen.. 1 Tab By Mouth At Bedtime 11)  Metformin Hcl 500 Mg Tabs (Metformin Hcl) .... Take One Tablet 2 Times Daily 12)  Methocarbamol 500 Mg Tabs (Methocarbamol) .... Take 1 Tablet By Mouth Three Times A Day As Needed 13)  Prilosec Otc 20 Mg Tbec (Omeprazole Magnesium) .... Take 1 Tablet By Mouth Two Times A Day 14)  Vitamin D (Ergocalciferol) 50000 Unit Caps (Ergocalciferol) .... Take 1 Capsule By Mouth Once Per Week  Allergies: 1)  ! Sulfa 2)  ! * Topamax  Past History:  Past medical, surgical, family and social histories (including risk factors) reviewed, and no changes noted (except as noted below).  Past Medical History: Reviewed history from 07/22/2010 and no changes required. Anemia-iron deficiency Depression Hyperlipidemia Hypertension Anxiety Gallstones Fibromyalgia  Past Surgical History: Reviewed history from 07/22/2010 and no changes required. 2000 exp lap for infertility 2001 gallbladder surgery 1989 tonsillectomy  Family History: Reviewed history from 07/22/2010 and no changes required. father: DM, bipolar mother: high cholesterol, atrial fibrillation, ?  melanoma sister: clear cell sarcoma in leg, hypothyroid MGM: Afib, HTN PGM: arthritis No FH of Colon Cancer:  Social History: Reviewed history from 05/30/2008 and no changes required. Occupation:teaches preschool Married 1 child, 2 step children: asthma, ADHD Never Smoked Alcohol use-yes, 4 times a month was married to alcoholic in past Drug use-no Regular exercise-yes 3-4 times a week Diet:  has tried weight watchers, [place] craig 3 meals a day, fruit and veggies, water, some caffeine a day  Review of Systems General:  Complains of fatigue; denies fever. CV:  Denies chest pain or discomfort. Resp:  Denies shortness of breath. GI:  Complains of indigestion and nausea; denies vomiting. GU:  Denies dysuria.  Physical Exam  General:  Overweight female  in NAD Mouth:  MMM Neck:  no carotid bruit or thyromegaly no cervical or supraclavicular lymphadenopathy  Lungs:  Normal respiratory effort, chest expands symmetrically. Lungs are clear to auscultation, no crackles or wheezes. Heart:  Normal rate and regular rhythm. S1 and S2 normal without gallop, murmur, click, rub or other extra sounds. Abdomen:  Bowel sounds positive,abdomen soft and non-tender without masses, organomegaly or hernias noted. Msk:  no focal ttp of vertebrae, ttp over B trapezius Pulses:  R and L posterior tibial pulses are full and equal bilaterally  Extremities:  no edema  Neurologic:  No cranial nerve deficits noted. Station and gait are normal. Plantar reflexes are down-going bilaterally. DTRs are symmetrical throughout. Sensory, motor and coordinative functions appear intact. Psych:  Oriented X3, memory intact for recent and remote, and slightly anxious.     Impression & Recommendations:  Problem # 1:  NECK PAIN, ACUTE (ICD-723.1) IMproved with valium. Discussed exercsies, heat and massage.  Her updated medication list for this problem includes:    Tizanidine Hcl 4 Mg Tabs (Tizanidine hcl) .Marland Kitchen... Take  one tablet every 8 hours do not exceed 3 in 24 houts    Diclofenac Sodium 75 Mg Tbec (Diclofenac sodium) ..... One by mouth 2 times daily with meals as needed    Methocarbamol 500 Mg Tabs (Methocarbamol) .Marland Kitchen... Take 1 tablet by mouth three times a day as needed  Problem # 2:  DEPRESSION/ANXIETY (ICD-300.4) Poor control. Eval woith labs per pt request to determine if early menopause clearly suggested.    Continue fluoxetine...may need to change this medication (higher dose not effective in past without SE) Orders: T-LH (66063-01601) T-FSH (09323-55732) T-Estradiol (20254-27062)  Problem # 3:  FIBROMYALGIA (ICD-729.1) Poor control. Further eval per Dr. Corliss Skains. ? labs to be scheduled. Pt not sure what. Her updated medication list for this problem includes:    Tizanidine Hcl 4 Mg Tabs (Tizanidine hcl) .Marland Kitchen... Take one tablet every 8 hours do not exceed 3 in 24 houts    Diclofenac Sodium 75 Mg Tbec (Diclofenac sodium) ..... One by mouth 2 times daily with meals as needed    Methocarbamol 500 Mg Tabs (Methocarbamol) .Marland Kitchen... Take 1 tablet by mouth three times a day as needed  Complete Medication List: 1)  Kls Allerclear D-24hr 10-240 Mg Xr24h-tab (Loratadine-pseudoephedrine) .... Take 1 tablet by mouth once a day 2)  Klonopin 1 Mg Tabs (Clonazepam) .Marland KitchenMarland KitchenMarland Kitchen  Take 1 tab by mouth at bedtime 3)  Simvastatin 20 Mg Tabs (Simvastatin) .Marland Kitchen.. 1 tab by mouth daily 4)  Camila 0.35 Mg Tabs (Norethindrone (contraceptive)) .... Take 1 tablet by mouth once a day 5)  Fluoxetine Hcl 10 Mg Caps (Fluoxetine hcl) .Marland Kitchen.. 1 tab by mouth daily 6)  Maxalt 10 Mg Tabs (Rizatriptan benzoate) .... As needed for migraines 7)  Triamcinolone Acetonide 0.5 % Crea (Triamcinolone acetonide) .... Aaa two times a day 8)  Tizanidine Hcl 4 Mg Tabs (Tizanidine hcl) .... Take one tablet every 8 hours do not exceed 3 in 24 houts 9)  Diclofenac Sodium 75 Mg Tbec (Diclofenac sodium) .... One by mouth 2 times daily with meals as needed 10)   Amitriptyline Hcl 25 Mg Tabs (Amitriptyline hcl) .Marland Kitchen.. 1 tab by mouth at bedtime 11)  Metformin Hcl 500 Mg Tabs (Metformin hcl) .... Take one tablet 2 times daily 12)  Methocarbamol 500 Mg Tabs (Methocarbamol) .... Take 1 tablet by mouth three times a day as needed 13)  Prilosec Otc 20 Mg Tbec (Omeprazole magnesium) .... Take 1 tablet by mouth two times a day 14)  Vitamin D (ergocalciferol) 50000 Unit Caps (Ergocalciferol) .... Take 1 capsule by mouth once per week  Patient Instructions: 1)  Please schedule a follow-up appointment in 2 weeks 30 min OV. 2)  Continue current meds. Hold amitryptiline. 3)  Only use valium if neck strain returning.    Orders Added: 1)  T-LH [[id number]] 2)  T-FSH [[id number]] 3)  T-Estradiol [82670-82425] 4)  Est. Patient Level IV [16109]    Current Allergies (reviewed today): ! SULFA ! * TOPAMAX

## 2010-11-04 NOTE — Progress Notes (Signed)
Summary: co worker has pertussis  Phone Note Call from Patient Call back at Work Phone [phone]   Caller: Patient Summary of Call: Pt states her co worker has been dx'd with pertussis and the coworker's doctor has recommeded that anyone that has been in close contact with her should be treated.  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  She is feeling fine, no sxs.  Please advise, uses [facility]. Initial call taken by: Lowella Petties CMA,  January 15, 2010 10:52 AM  Follow-up for Phone Call        When she got tetanus in past was it TDap or tetanus alone..we have down that she got in in 07/2008, but not in our office.  if it was Tdap then she has been vaccinated and should be good, if not I will call in rx for antibiotics to treat..let me know.  Follow-up by: Kerby Nora MD,  January 15, 2010 1:48 PM  Additional Follow-up for Phone Call Additional follow up Details #1::        Patient is going to call urgent care were she got her last tetanus shot Additional Follow-up by: Benny Lennert CMA Duncan Dull),  January 16, 2010 8:07 AM    Additional Follow-up for Phone Call Additional follow up Details #2::    Pt called urgent care and they have no record of giving her a Td shot, so she will need the abx called to [facility].  Lowella Petties CMA  January 16, 2010 10:25 AM   New/Updated Medications: AZITHROMYCIN 250 MG  TABS (AZITHROMYCIN) 2 by  mouth today and then 1 daily for 4 days Prescriptions: AZITHROMYCIN 250 MG  TABS (AZITHROMYCIN) 2 by  mouth today and then 1 daily for 4 days  #6 x 0   Entered and Authorized by:   Hannah Beat MD   Signed by:   Hannah Beat MD on 01/16/2010   Method used:   [facility] [facility]ail[facility]       [address]       [place], [place]  [address]       Ph: [phone] or [phone]       Fax: [phone]   RxID:   [id number]         Appended Document: co  worker has pertussis patient advised rx sent to pharmacy.Consuello Masse CMA

## 2010-11-06 NOTE — Letter (Signed)
Summary: DNA Testing Form/[facility]   DNA Testing Form/[facility]   Imported By: Lanelle Bal 09/19/2010 10:06:57  _____________________________________________________________________  External Attachment:    Type:   Image     Comment:   External Document

## 2010-11-06 NOTE — Assessment & Plan Note (Signed)
Summary: ROA FOR FOLLOW-UP/JRR   Vital Signs:  Patient profile:   45 year old female Height:      63.25 inches Weight:      233 pounds BMI:     41.10 Temp:     98.3 degrees F oral Pulse rate:   76 / minute Pulse rhythm:   regular BP sitting:   124 / 82  (left arm) Cuff size:   large  Vitals Entered By: Linde Gillis CMA Duncan Dull) (September 02, 2010 3:45 PM) CC: follow-up visit   History of Present Illness: EGD with Dr. Arlyce Dice on 11/22..s/p dilation of esophagel stricture.   Per pt dysphagia improved.  On omeprazole daily for 1 more month..then has follow up appt.  Depression, anxiety: On prozac 10 mg daily and amitryptiline 25 mg  Clonazepam at night time.   In past was on cymbalta as well as wellbutrin.   Continuing to have fibromyalgia pain...sees Dr. Corliss Skains... muscle relaxants not helping much.. rarely.  Interested in stopping clonazepam..but has noted she cannot sleep if she does not take.   Looking into seeing bariatric MD.. for HCG injections, B12, phentermine. or low calorie diet. [facility] says she cannot afford seeing them..wants me to prescribe phentermine.      Problems Prior to Update: 1)  Neck Pain, Acute  (ICD-723.1) 2)  ? 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) 5)  Knee Pain, Bilateral  (ICD-719.46) 6)  Acne Vulgaris  (ICD-706.1) 7)  Prediabetes  (ICD-790.29) 8)  Screening For Lipoid Disorders  (ICD-V77.91) 9)  Obesity, Unspecified  (ICD-278.00) 10)  Hx of Cardiac Murmur  (ICD-785.2) 11)  Hypertension  (ICD-401.9) 12)  Hyperlipidemia  (ICD-272.4) 13)  Common Migraine  (ICD-346.10) 14)  Fibromyalgia  (ICD-729.1) 15)  Premenstrual Dysphoric Syndrome  (ICD-625.4) 16)  Depression/anxiety  (ICD-300.4) 17)  Anemia-iron Deficiency  (ICD-280.9) 18)  Asthma, Exercise Induced  (ICD-493.81)  Current Medications (verified): 1)  Kls Allerclear D-24hr 10-240 Mg Xr24h-Tab (Loratadine-Pseudoephedrine) ....  Take 1 Tablet By Mouth Once A Day 2)  Klonopin 1 Mg Tabs (Clonazepam) .... Take 1 Tab By Mouth At Bedtime 3)  Simvastatin 20 Mg Tabs (Simvastatin) .Marland Kitchen.. 1 Tab By Mouth Daily 4)  Camila 0.35 Mg Tabs (Norethindrone (Contraceptive)) .... Take 1 Tablet By Mouth Once A Day 5)  Venlafaxine Hcl 37.5 Mg Xr24h-Cap (Venlafaxine Hcl) .Marland Kitchen.. 1 Tab Po Daily By Mouth Daily X 1 Week Then Increase To 2 Tabs 6)  Maxalt 10 Mg Tabs (Rizatriptan Benzoate) .... As Needed For Migraines 7)  Triamcinolone Acetonide 0.5 % Crea (Triamcinolone Acetonide) .... Aaa Two Times A Day 8)  Amitriptyline Hcl 25 Mg Tabs (Amitriptyline Hcl) .Marland Kitchen.. 1 Tab By Mouth At Bedtime 9)  Metformin Hcl 500 Mg Tabs (Metformin Hcl) .... Take One Tablet 2 Times Daily 10)  Prilosec Otc 20 Mg Tbec (Omeprazole Magnesium) .... Take 1 Tablet By Mouth Two Times A Day 11)  Vitamin D (Ergocalciferol) 50000 Unit Caps (Ergocalciferol) .... Take 1 Capsule By Mouth Once Per Week  Allergies: 1)  ! Sulfa 2)  ! * Topamax  Past History:  Past medical, surgical, family and social histories (including risk factors) reviewed, and no changes noted (except as noted below).  Past Medical History: Reviewed history from 07/22/2010 and no changes required. Anemia-iron deficiency Depression Hyperlipidemia Hypertension Anxiety Gallstones Fibromyalgia  Past Surgical History: Reviewed history from 07/22/2010 and no changes required. 2000 exp lap for infertility 2001 gallbladder surgery 1989 tonsillectomy  Family  History: Reviewed history from 07/22/2010 and no changes required. father: DM, bipolar mother: high cholesterol, atrial fibrillation, ? melanoma sister: clear cell sarcoma in leg, hypothyroid MGM: Afib, HTN PGM: arthritis No FH of Colon Cancer:  Social History: Reviewed history from 05/30/2008 and no changes required. Occupation:teaches preschool Married 1 child, 2 step children: asthma, ADHD Never Smoked Alcohol use-yes, 4 times a month was  married to alcoholic in past Drug use-no Regular exercise-yes 3-4 times a week Diet:  has tried weight watchers, [place] craig 3 meals a day, fruit and veggies, water, some caffeine a day  Review of Systems General:  Complains of fatigue; denies fever. CV:  Denies chest pain or discomfort. Resp:  Denies cough, sputum productive, and wheezing. GI:  Denies abdominal pain. GU:  Denies dysuria.  Physical Exam  General:  Overweight female  in NAD Mouth:  Oral mucosa and oropharynx without lesions or exudates.  Teeth in good repair. Neck:  no carotid bruit or thyromegaly no cervical or supraclavicular lymphadenopathy  Lungs:  Normal respiratory effort, chest expands symmetrically. Lungs are clear to auscultation, no crackles or wheezes. Heart:  Normal rate and regular rhythm. S1 and S2 normal without gallop, murmur, click, rub or other extra sounds. Abdomen:  Bowel sounds positive,abdomen soft and non-tender without masses, organomegaly or hernias noted. Msk:  diffuse ttp over body Pulses:  R and L posterior tibial pulses are full and equal bilaterally  Extremities:  no edema  Psych:  Oriented X3, memory intact for recent and remote, and slightly anxious.     Impression & Recommendations:  Problem # 1:  DEPRESSION/ANXIETY (ICD-300.4) Pt wishes to stop all sedating meds aand to avoid meds that cause weight gain. \  Will stop SSRI and change to venlafaxine with weight loss as SE.  Continue  klonopin at bedtime with goal to wean off. Decrease amitryptiline to 25 mg at bedtime with goal to stop. Total visit time > 50% spent counseling and cordinating patients care   Problem # 2:  FIBROMYALGIA (ICD-729.1) Stop tizandiine and methecarbamol since not helping. Stop diclofenac since not helping.  The following medications were removed from the medication list:    Tizanidine Hcl 4 Mg Tabs (Tizanidine hcl) .Marland Kitchen... Take one tablet every 8 hours do not exceed 3 in 24 houts    Diclofenac  Sodium 75 Mg Tbec (Diclofenac sodium) ..... One by mouth 2 times daily with meals as needed    Methocarbamol 500 Mg Tabs (Methocarbamol) .Marland Kitchen... Take 1 tablet by mouth three times a day as needed  Problem # 3:  PREDIABETES (ICD-790.29) Talk with ENDO in 11/2009 abut whether other medicine would be option other than metformin.Marland Kitchen or if you can stop this given continued weight gain. Her updated medication list for this problem includes:    Metformin Hcl 500 Mg Tabs (Metformin hcl) .Marland Kitchen... Take one tablet 2 times daily  Complete Medication List: 1)  Kls Allerclear D-24hr 10-240 Mg Xr24h-tab (Loratadine-pseudoephedrine) .... Take 1 tablet by mouth once a day 2)  Klonopin 1 Mg Tabs (Clonazepam) .... Take 1 tab by mouth at bedtime 3)  Simvastatin 20 Mg Tabs (Simvastatin) .Marland Kitchen.. 1 tab by mouth daily 4)  Camila 0.35 Mg Tabs (Norethindrone (contraceptive)) .... Take 1 tablet by mouth once a day 5)  Venlafaxine Hcl 37.5 Mg Xr24h-cap (Venlafaxine hcl) .Marland Kitchen.. 1 tab po daily by mouth daily x 1 week then increase to 2 tabs 6)  Maxalt 10 Mg Tabs (Rizatriptan benzoate) .... As needed for migraines 7)  Triamcinolone Acetonide 0.5 % Crea (Triamcinolone acetonide) .... Aaa two times a day 8)  Amitriptyline Hcl 25 Mg Tabs (Amitriptyline hcl) .Marland Kitchen.. 1 tab by mouth at bedtime 9)  Metformin Hcl 500 Mg Tabs (Metformin hcl) .... Take one tablet 2 times daily 10)  Prilosec Otc 20 Mg Tbec (Omeprazole magnesium) .... Take 1 tablet by mouth two times a day 11)  Vitamin D (ergocalciferol) 50000 Unit Caps (Ergocalciferol) .... Take 1 capsule by mouth once per week  Patient Instructions: 1)  Stop tizandiine and methecarbamol since not helping. 2)  Stop diclofenac since not helping.  3)  Talk with ENDO in 11/2009 abut whether other medicine would be option other than metformin.Marland Kitchen or if you can stop this given continued weight gain. 4)  Continue  klonopin at bedtime with goal to wean off. 5)  Decrease amitryptiline to 25 mg at bedtime  with goal to stop. 6)   Stop prozac..replace with venlafaxine.  7)  Please schedule a follow-up appointment in 2-3 weeks.  8)   Get back on track with diet changes. Prescriptions: VENLAFAXINE HCL 37.5 MG XR24H-CAP (VENLAFAXINE HCL) 1 tab po daily by mouth daily x 1 week then increase to 2 tabs  #60 x 0   Entered and Authorized by:   Kerby Nora MD   Signed by:   Kerby Nora MD on 09/02/2010   Method used:   [facility]        [id number]       [address]       [place], [place]  [address]       Ph: [phone] or [phone]       Fax: [phone]   RxID:   [id number] VENLAFAXINE HCL 37.5 MG XR24H-CAP (VENLAFAXINE HCL) 1 ta spo daily b by mouth daily x 1 week then increase to 2 ta  #60 x 0   Entered and Authorized by:   Kerby Nora MD   Signed by:   Kerby Nora MD on 09/02/2010   Method used:   [facility]        [facility] [facility])       [address]       [place], [place]  [address]       Ph: [phone] or [phone]       Fax: [phone]   RxID:   [id number]    Orders Added: 1)  Est. Patient Level IV [20254]    Current Allergies (reviewed today): ! SULFA ! * TOPAMAX

## 2010-11-06 NOTE — Assessment & Plan Note (Signed)
Summary: 3 wk f/u dlo   Vital Signs:  Patient profile:   45 year old female Height:      63.25 inches Weight:      237.8 pounds BMI:     41.94 Temp:     98.7 degrees F oral Pulse rate:   76 / minute Pulse rhythm:   regular BP sitting:   110 / 78  (left arm) Cuff size:   large  Vitals Entered By: Benny Lennert CMA Duncan Dull) (September 19, 2010 3:58 PM)  History of Present Illness: Chief complaint 3 week follow up    At last OV:  Problem # 1:  DEPRESSION/ANXIETY (ICD-300.4) Pt wishes to stop all sedating meds aand to avoid meds that cause weight gain.   Will stop SSRI and change to venlafaxine with weight loss as SE.  Continue  klonopin at bedtime with goal to wean off. Did not ecrease amitryptiline to 25 mg at bedtime with goal to stop.  No SE to venlafaxine.  Mood much better as well... 50% improvement...more active.  Plans to start exercsiing again.     Problem # 2:  FIBROMYALGIA (ICD-729.1) Stop tizandiine and methecarbamol since not helping. Stop diclofenac since not helping.  Stopped advil.  NOW on meloxicam with tremendous improvement in symptoms.  Able to move better. No current stomach irritation.  Problem # 3:  PREDIABETES (ICD-790.29) Talk with ENDO in 11/2009 abut whether other medicine would be option other than metformin.Marland Kitchen or if you can stop this given continued weight gain. Her updated medication list for this problem includes:    Metformin Hcl 500 Mg Tabs (Metformin hcl) .Marland Kitchen... Take one tablet 2 times daily   Problems Prior to Update: 1)  Neck Pain, Acute  (ICD-723.1) 2)  ? 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) 5)  Knee Pain, Bilateral  (ICD-719.46) 6)  Acne Vulgaris  (ICD-706.1) 7)  Prediabetes  (ICD-790.29) 8)  Screening For Lipoid Disorders  (ICD-V77.91) 9)  Obesity, Unspecified  (ICD-278.00) 10)  Hx of Cardiac Murmur  (ICD-785.2) 11)  Hypertension  (ICD-401.9) 12)  Hyperlipidemia   (ICD-272.4) 13)  Common Migraine  (ICD-346.10) 14)  Fibromyalgia  (ICD-729.1) 15)  Premenstrual Dysphoric Syndrome  (ICD-625.4) 16)  Depression/anxiety  (ICD-300.4) 17)  Anemia-iron Deficiency  (ICD-280.9) 18)  Asthma, Exercise Induced  (ICD-493.81)  Current Medications (verified): 1)  Kls Allerclear D-24hr 10-240 Mg Xr24h-Tab (Loratadine-Pseudoephedrine) .... Take 1 Tablet By Mouth Once A Day 2)  Klonopin 1 Mg Tabs (Clonazepam) .... Take 1 Tab By Mouth At Bedtime 3)  Simvastatin 20 Mg Tabs (Simvastatin) .Marland Kitchen.. 1 Tab By Mouth Daily 4)  Camila 0.35 Mg Tabs (Norethindrone (Contraceptive)) .... Take 1 Tablet By Mouth Once A Day 5)  Venlafaxine Hcl 75 Mg Xr24h-Tab (Venlafaxine Hcl) .... Take 1 Tablet By Mouth Once A Day 6)  Maxalt 10 Mg Tabs (Rizatriptan Benzoate) .... As Needed For Migraines 7)  Triamcinolone Acetonide 0.5 % Crea (Triamcinolone Acetonide) .... Aaa Two Times A Day 8)  Amitriptyline Hcl 25 Mg Tabs (Amitriptyline Hcl) .Marland Kitchen.. 1 Tab By Mouth At Bedtime 9)  Metformin Hcl 500 Mg Tabs (Metformin Hcl) .... Take One Tablet 2 Times Daily 10)  Prilosec Otc 20 Mg Tbec (Omeprazole Magnesium) .... Take 1 Tablet By Mouth Two Times A Day 11)  Vitamin D (Ergocalciferol) 50000 Unit Caps (Ergocalciferol) .... Take 1 Capsule By Mouth Once Per Week 12)  Meloxicam 15 Mg Tabs (Meloxicam) .... One Tablet Daily  Allergies: 1)  ! Sulfa 2)  ! * Topamax  Past History:  Past medical, surgical, family and social histories (including risk factors) reviewed, and no changes noted (except as noted below).  Past Medical History: Reviewed history from 07/22/2010 and no changes required. Anemia-iron deficiency Depression Hyperlipidemia Hypertension Anxiety Gallstones Fibromyalgia  Past Surgical History: Reviewed history from 07/22/2010 and no changes required. 2000 exp lap for infertility 2001 gallbladder surgery 1989 tonsillectomy  Family History: Reviewed history from 07/22/2010 and no changes  required. father: DM, bipolar mother: high cholesterol, atrial fibrillation, ? melanoma sister: clear cell sarcoma in leg, hypothyroid MGM: Afib, HTN PGM: arthritis No FH of Colon Cancer:  Social History: Reviewed history from 05/30/2008 and no changes required. Occupation:teaches preschool Married 1 child, 2 step children: asthma, ADHD Never Smoked Alcohol use-yes, 4 times a month was married to alcoholic in past Drug use-no Regular exercise-yes 3-4 times a week Diet:  has tried weight watchers, jenny craig 3 meals a day, fruit and veggies, water, some caffeine a day  Review of Systems General:  Denies fatigue. CV:  Denies chest pain or discomfort.  Physical Exam  General:  Overweight female  in NAD Mouth:  Oral mucosa and oropharynx without lesions or exudates.  Teeth in good repair. Neck:  no carotid bruit or thyromegaly no cervical or supraclavicular lymphadenopathy  Lungs:  Normal respiratory effort, chest expands symmetrically. Lungs are clear to auscultation, no crackles or wheezes. Heart:  Normal rate and regular rhythm. S1 and S2 normal without gallop, murmur, click, rub or other extra sounds. Pulses:  R and L posterior tibial pulses are full and equal bilaterally  Extremities:  no edema  Psych:  Cognition and judgment appear intact. Alert and cooperative with normal attention span and concentration. No apparent delusions, illusions, hallucinations   Impression & Recommendations:  Problem # 1:  FIBROMYALGIA (ICD-729.1) Improved on meloxicam. Able to stop muscle relaxants. Her updated medication list for this problem includes:    Meloxicam 15 Mg Tabs (Meloxicam) ..... One tablet daily  Problem # 2:  DEPRESSION/ANXIETY (ICD-300.4) Improved on venlafaxine. Able to exercsie and e more active.  Wean off amitryptiline. Continue clonazepam with goal to wean off.   Complete Medication List: 1)  Kls Allerclear D-24hr 10-240 Mg Xr24h-tab (Loratadine-pseudoephedrine)  .... Take 1 tablet by mouth once a day 2)  Klonopin 1 Mg Tabs (Clonazepam) .... Take 1 tab by mouth at bedtime 3)  Simvastatin 20 Mg Tabs (Simvastatin) .Marland Kitchen.. 1 tab by mouth daily 4)  Camila 0.35 Mg Tabs (Norethindrone (contraceptive)) .... Take 1 tablet by mouth once a day 5)  Venlafaxine Hcl 75 Mg Xr24h-tab (Venlafaxine hcl) .... Take 1 tablet by mouth once a day 6)  Maxalt 10 Mg Tabs (Rizatriptan benzoate) .... As needed for migraines 7)  Triamcinolone Acetonide 0.5 % Crea (Triamcinolone acetonide) .... Aaa two times a day 8)  Amitriptyline Hcl 25 Mg Tabs (Amitriptyline hcl) .Marland Kitchen.. 1 tab by mouth at bedtime 9)  Metformin Hcl 500 Mg Tabs (Metformin hcl) .... Take one tablet 2 times daily 10)  Prilosec Otc 20 Mg Tbec (Omeprazole magnesium) .... Take 1 tablet by mouth two times a day 11)  Vitamin D (ergocalciferol) 50000 Unit Caps (Ergocalciferol) .... Take 1 capsule by mouth once per week 12)  Meloxicam 15 Mg Tabs (Meloxicam) .... One tablet daily  Patient Instructions: 1)  Decrease amitryptiline to 25 mg daily x 1-2 weeks ... if continue to doing well, may stop amitryptiline.  2)   Start exercise.  3)  Continue clonazepam.  4)  Discuss metformin with ENDO as planned. 5)  Please schedule a follow-up appointment in 1-2 month.    Orders Added: 1)  Est. Patient Level III [56213]    Current Allergies (reviewed today): ! SULFA ! * TOPAMAX

## 2010-11-14 ENCOUNTER — Telehealth: Payer: Self-pay | Admitting: Family Medicine

## 2010-11-17 ENCOUNTER — Ambulatory Visit: Payer: Self-pay | Admitting: Family Medicine

## 2010-11-18 ENCOUNTER — Encounter: Payer: Self-pay | Admitting: Family Medicine

## 2010-11-18 ENCOUNTER — Ambulatory Visit (INDEPENDENT_AMBULATORY_CARE_PROVIDER_SITE_OTHER): Payer: BC Managed Care – PPO | Admitting: Family Medicine

## 2010-11-18 DIAGNOSIS — IMO0001 Reserved for inherently not codable concepts without codable children: Secondary | ICD-10-CM

## 2010-11-18 DIAGNOSIS — R209 Unspecified disturbances of skin sensation: Secondary | ICD-10-CM

## 2010-11-18 DIAGNOSIS — R1313 Dysphagia, pharyngeal phase: Secondary | ICD-10-CM | POA: Insufficient documentation

## 2010-11-18 DIAGNOSIS — F341 Dysthymic disorder: Secondary | ICD-10-CM

## 2010-11-20 NOTE — Progress Notes (Signed)
Summary: venlafaxine / meloxicam   Phone Note Refill Request Message from:  Fax from Pharmacy on November 14, 2010 4:27 PM  Refills Requested: Medication #1:  VENLAFAXINE HCL 75 MG XR24H-TAB Take 1 tablet by mouth once a day  Medication #2:  MELOXICAM 15 MG TABS one tablet daily. Patient had an appt for monday which we had to reschedule. She is now coming in on tuesday. She is asking for a refill to be [facility] ot [facility].   Initial call taken by: Melody Comas,  November 14, 2010 4:37 PM    Prescriptions: MELOXICAM 15 MG TABS (MELOXICAM) one tablet daily  #30 Tablet x 3   Entered and Authorized by:   Kerby Nora MD   Signed by:   Kerby Nora MD on 11/14/2010   Method used:   [facility]        [facility] [id number]ret[facility]       [address]       [place], [place]  [address]       Ph: [phone] or [phone]       Fax: [phone]   RxID:   [id number] VENLAFAXINE HCL 75 MG XR24H-TAB (VENLAFAXINE HCL) Take 1 tablet by mouth once a day  #30 x 11   Entered and Authorized by:   Kerby Nora MD   Signed by:   Kerby Nora MD on 11/14/2010   Method used:   [facility]        [facility] (retail)       [address]       [place], [place]  [address]       Ph: [phone] or [phone]       Fax: [phone]   RxID:   [id number]

## 2010-11-26 NOTE — Assessment & Plan Note (Signed)
Summary: F/U/CLE  [employer]   Vital Signs:  Patient profile:   45 year old female Height:      63.25 inches Weight:      236 pounds BMI:     41.63 Temp:     98.5 degrees F oral Pulse rate:   70 / minute Pulse rhythm:   regular BP sitting:   110 / 70  (left arm) Cuff size:   large  Vitals Entered By: Linde Gillis CMA Duncan Dull) (November 18, 2010 3:11 PM) CC: follow up, Hypertension Management   History of Present Illness:  Fibromylgia:  On meloxicam helping a lot with joints. No stomach irritation.  Depression: Better controlled on venlafaxine. Given obesity.Marland Kitchen interested in weaning off aht gain. n med that could be contributing to wei Has been able to  wean off amitryptiline. Using Klonopin at bedtime.  Has lost some weight  Venlaafaxine does have parasthesia listed aas SE.   Cough...recent URI lasted several weeks...has improved Occ when cough or sneeze.on upper or lower back burning feeling.  Finger tips tingling 4-5 min at a time 4-5 times a day.  Both hands. No weakness  B12 nml at Dr. Reginia Forts office, nml  thyroid.  Talked to ENDO about weight gain... told to stop metformin.    Continues to feel like something in throat.. trouble swallowing liquids more than solids. 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  Was on omeprazole for a month... but has stopped now.   Hypertension History:      Positive major cardiovascular risk factors include hyperlipidemia and hypertension.  Negative major cardiovascular risk factors include female age less than 84 years old and non-tobacco-user status.     Problems Prior to Update: 1)  Neck Pain, Acute  (ICD-723.1) 2)  ? 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) 5)  Knee Pain, Bilateral  (ICD-719.46) 6)  Acne Vulgaris  (ICD-706.1) 7)  Prediabetes  (ICD-790.29) 8)  Screening For Lipoid Disorders  (ICD-V77.91) 9)  Obesity, Unspecified  (ICD-278.00) 10)  Hx of Cardiac Murmur   (ICD-785.2) 11)  Hypertension  (ICD-401.9) 12)  Hyperlipidemia  (ICD-272.4) 13)  Common Migraine  (ICD-346.10) 14)  Fibromyalgia  (ICD-729.1) 15)  Premenstrual Dysphoric Syndrome  (ICD-625.4) 16)  Depression/anxiety  (ICD-300.4) 17)  Anemia-iron Deficiency  (ICD-280.9) 18)  Asthma, Exercise Induced  (ICD-493.81)  Current Medications (verified): 1)  Kls Allerclear D-24hr 10-240 Mg Xr24h-Tab (Loratadine-Pseudoephedrine) .... Take 1 Tablet By Mouth Once A Day 2)  Klonopin 1 Mg Tabs (Clonazepam) .... Take 1 Tab By Mouth At Bedtime 3)  Simvastatin 20 Mg Tabs (Simvastatin) .Marland Kitchen.. 1 Tab By Mouth Daily 4)  Camila 0.35 Mg Tabs (Norethindrone (Contraceptive)) .... Take 1 Tablet By Mouth Once A Day 5)  Venlafaxine Hcl 150 Mg Xr24h-Cap (Venlafaxine Hcl) .... Take 1 Tablet By Mouth Once A Day 6)  Maxalt 10 Mg Tabs (Rizatriptan Benzoate) .... As Needed For Migraines 7)  Triamcinolone Acetonide 0.5 % Crea (Triamcinolone Acetonide) .... Aaa Two Times A Day 8)  Prilosec Otc 20 Mg Tbec (Omeprazole Magnesium) .... Take 1 Tablet By Mouth Two Times A Day 9)  Vitamin D (Ergocalciferol) 50000 Unit Caps (Ergocalciferol) .... Take 1 Capsule By Mouth Once Per Week 10)  Meloxicam 15 Mg Tabs (Meloxicam) .... One Tablet Daily 11)  Omeprazole 20 Mg Cpdr (Omeprazole) .... Take 1 Tablet By Mouth Once A Day  Allergies: 1)  ! Sulfa 2)  ! * Topamax  Past History:  Past medical,  surgical, family and social histories (including risk factors) reviewed, and no changes noted (except as noted below).  Past Medical History: Reviewed history from 07/22/2010 and no changes required. Anemia-iron deficiency Depression Hyperlipidemia Hypertension Anxiety Gallstones Fibromyalgia  Past Surgical History: Reviewed history from 07/22/2010 and no changes required. 2000 exp lap for infertility 2001 gallbladder surgery 1989 tonsillectomy  Family History: Reviewed history from 07/22/2010 and no changes required. father: DM,  bipolar mother: high cholesterol, atrial fibrillation, ? melanoma sister: clear cell sarcoma in leg, hypothyroid MGM: Afib, HTN PGM: arthritis No FH of Colon Cancer:  Social History: Reviewed history from 05/30/2008 and no changes required. Occupation:teaches preschool Married 1 child, 2 step children: asthma, ADHD Never Smoked Alcohol use-yes, 4 times a month was married to alcoholic in past Drug use-no Regular exercise-yes 3-4 times a week Diet:  has tried weight watchers, [place] craig 3 meals a day, fruit and veggies, water, some caffeine a day  Review of Systems General:  Denies fatigue and fever. CV:  Denies chest pain or discomfort. Resp:  Denies shortness of breath. GI:  Denies abdominal pain. GU:  Denies dysuria.  Physical Exam  General:  obese appearing female in NAD  Mouth:  MMM Neck:  no carotid bruit or thyromegaly no cervical or supraclavicular lymphadenopathy  Lungs:  Normal respiratory effort, chest expands symmetrically. Lungs are clear to auscultation, no crackles or wheezes. Heart:  Normal rate and regular rhythm. S1 and S2 normal without gallop, murmur, click, rub or other extra sounds. Msk:  diffuse ttp in muscles of back, neg SLR, no vertebral ttp mildly positive tinel on right  Pulses:  R and L posterior tibial pulses are full and equal bilaterally  Extremities:  no edema  Skin:  Intact without suspicious lesions or rashes Psych:  Cognition and judgment appear intact. Alert and cooperative with normal attention span and concentration. No apparent delusions, illusions, hallucinations   Impression & Recommendations:  Problem # 1:  FIBROMYALGIA (ICD-729.1) Improved control on meloxicam. Her updated medication list for this problem includes:    Meloxicam 15 Mg Tabs (Meloxicam) ..... One tablet daily  Problem # 2:  DEPRESSION/ANXIETY (ICD-300.4) IMproved but not at goal. Increase venlafaxine.   Problem # 3:  DYSPHAGIA PHARYNGEAL PHASE  (ICD-787.23) Likely due to GERD. Not enough time to have stricture return. Start by treating with PPI.  Meloxicam may be irritating GERD but pt with so much benifit she wishes to continue it for now... no specific indigestion or epigastric pain.   Problem # 4:  PARESTHESIA, HANDS (ICD-782.0) ? due to SE of venlaxine, vs mild carpal tunnel. Continue to follow . Avoid nerve compression at carpal tunnel.   Complete Medication List: 1)  Kls Allerclear D-24hr 10-240 Mg Xr24h-tab (Loratadine-pseudoephedrine) .... Take 1 tablet by mouth once a day 2)  Klonopin 1 Mg Tabs (Clonazepam) .... Take 1 tab by mouth at bedtime 3)  Simvastatin 20 Mg Tabs (Simvastatin) .Marland Kitchen.. 1 tab by mouth daily 4)  Camila 0.35 Mg Tabs (Norethindrone (contraceptive)) .... Take 1 tablet by mouth once a day 5)  Venlafaxine Hcl 150 Mg Xr24h-cap (Venlafaxine hcl) .... Take 1 tablet by mouth once a day 6)  Maxalt 10 Mg Tabs (Rizatriptan benzoate) .... As needed for migraines 7)  Triamcinolone Acetonide 0.5 % Crea (Triamcinolone acetonide) .... Aaa two times a day 8)  Prilosec Otc 20 Mg Tbec (Omeprazole magnesium) .... Take 1 tablet by mouth two times a day 9)  Vitamin D (ergocalciferol) 50000 Unit Caps (Ergocalciferol) .... Take  1 capsule by mouth once per week 10)  Meloxicam 15 Mg Tabs (Meloxicam) .... One tablet daily 11)  Omeprazole 20 Mg Cpdr (Omeprazole) .... Take 1 tablet by mouth once a day  Hypertension Assessment/Plan:      The patient's hypertensive risk group is category B: At least one risk factor (excluding diabetes) with no target organ damage.  Her calculated 10 year risk of coronary heart disease is 2 %.  Today's blood pressure is 110/70.  Her blood pressure goal is < 140/90.  Patient Instructions: 1)  Restart omeprazole at 20 mg dose x 1-2 week, if not improving on that dose increase to 2 tabs daily. 2)   Increase venlafaxine to 2 tab by mouth daily. 3)   Start exercising.Marland Kitchen 2-3 times a week a minimum to start. 4)   Follow up in 1 month 30 min OV.     Orders Added: 1)  Est. Patient Level IV [16109]    Current Allergies (reviewed today): ! SULFA ! * TOPAMAX

## 2010-11-27 ENCOUNTER — Other Ambulatory Visit: Payer: Self-pay | Admitting: Gynecology

## 2010-12-03 ENCOUNTER — Other Ambulatory Visit: Payer: BC Managed Care – PPO

## 2010-12-03 ENCOUNTER — Encounter: Payer: Self-pay | Admitting: Family Medicine

## 2010-12-08 ENCOUNTER — Telehealth: Payer: Self-pay | Admitting: Family Medicine

## 2010-12-09 ENCOUNTER — Encounter: Payer: Self-pay | Admitting: Family Medicine

## 2010-12-16 LAB — GLUCOSE, CAPILLARY
Glucose-Capillary: 133 mg/dL — ABNORMAL HIGH (ref 70–99)
Glucose-Capillary: 151 mg/dL — ABNORMAL HIGH (ref 70–99)
Glucose-Capillary: 77 mg/dL (ref 70–99)
Glucose-Capillary: 97 mg/dL (ref 70–99)

## 2010-12-16 NOTE — Progress Notes (Signed)
Summary: regarding effexor  Phone Note Call from Patient Call back at Home Phone [phone]   Caller: Patient Summary of Call: Pt states her effexor dose has been increased and she is out of it.  She is now taking 75 mg's twice a day, she says she didnt notice a huge difference with the increased dose. She says she will continue with twice a day dosing until she is seen again on 3/23.   [facility]               Lowella Petties CMA, AAMA  December 08, 2010 8:38 AM   Follow-up for Phone Call        sent refill in on 150 mg tabs -- take these once a day and discuss with Dr. B at f/u Follow-up by: Hannah Beat MD,  December 08, 2010 8:56 AM    Prescriptions: VENLAFAXINE HCL 150 MG XR24H-CAP (VENLAFAXINE HCL) Take 1 tablet by mouth once a day  #30 x 1   Entered and Authorized by:   Hannah Beat MD   Signed by:   Hannah Beat MD on 12/08/2010   Method used:   [facility] [facility]       [address]       [place], [place]  [address]       Ph: [phone] or [phone]       Fax: [phone]   RxID:   [id number]

## 2010-12-23 NOTE — Letter (Signed)
Summary: [facility]   [facility]   Imported By: Kassie Mends 12/16/2010 08:21:37  _____________________________________________________________________  External Attachment:    Type:   Image     Comment:   External Document

## 2010-12-26 ENCOUNTER — Ambulatory Visit: Payer: BC Managed Care – PPO | Admitting: Family Medicine

## 2010-12-30 ENCOUNTER — Ambulatory Visit: Payer: BC Managed Care – PPO | Admitting: Family Medicine

## 2010-12-30 DIAGNOSIS — Z0289 Encounter for other administrative examinations: Secondary | ICD-10-CM

## 2011-01-12 ENCOUNTER — Ambulatory Visit
Admission: RE | Admit: 2011-01-12 | Discharge: 2011-01-12 | Disposition: A | Discharge status: Home / Self Care | Payer: BC Managed Care – PPO | Source: Ambulatory Visit | Attending: Gynecology | Admitting: Gynecology

## 2011-01-12 MED ORDER — GADOBENATE DIMEGLUMINE 529 MG/ML IV SOLN
10.0000 mL | Freq: Once | INTRAVENOUS | Status: AC | PRN
  Administered 2011-01-12: 10 mL via INTRAVENOUS

## 2011-01-15 LAB — POCT HEMOGLOBIN-HEMACUE: Hemoglobin: 14.6 g/dL (ref 12.0–15.0)

## 2011-01-20 ENCOUNTER — Other Ambulatory Visit: Payer: Self-pay | Admitting: Family Medicine

## 2011-02-11 ENCOUNTER — Other Ambulatory Visit: Payer: Self-pay | Admitting: Family Medicine

## 2011-02-12 ENCOUNTER — Other Ambulatory Visit: Payer: Self-pay | Admitting: *Deleted

## 2011-02-12 MED ORDER — VENLAFAXINE HCL ER 150 MG PO CP24: 150.0000 mg | ORAL_CAPSULE | Freq: Every day | ORAL | Status: DC

## 2011-02-17 NOTE — H&P (Signed)
Selena, [name] NO.:  [id number]   MEDICAL RECORD NO.:  [id number]          PATIENT TYPE:  EMS   LOCATION:  [facility]                         FACILITY:  [facility]   PHYSICIAN:  Gordy Savers, MDDATE OF BIRTH:  1966/03/01   DATE OF ADMISSION:  07/13/2008  DATE OF DISCHARGE:                              HISTORY & PHYSICAL   CHIEF COMPLAINT:  Chest pain.   HISTORY OF PRESENT ILLNESS:  The patient is a 45 year old white female  was fairly stable until approximately 5 days ago.  At that time, she  presented to her primary care Selena [name] with history of occasional chest  discomfort, but mainly progressive dyspnea on exertion.  Throughout the  week, she has had frequent episodes of exertional chest discomfort, but  mainly dyspnea with activity.  Other complaints include nonproductive  cough and fatigue.  The pain is described as both sharp sensation in the  midchest area and also more of a heaviness.  There is some radiation to  the shoulder and interscapular region.  This past week, the patient has  been treated for an upper respiratory tract infection with azithromycin  and prednisone.  On the evening of admission, she became weak,  diaphoretic, and was described by onlookers to be quite pale.  She  developed chest pain associated with shortness of breath and the  diaphoresis.  Because of worsening symptoms, she presented to the  emergency department for evaluation.  EKG revealed no acute  abnormalities.  Laboratory screen was largely unremarkable.  A chest x-  ray was obtained and revealed no active cardiopulmonary disease.  A CT  angio of the chest was also performed that was normal.  The patient is  now admitted for further evaluation of her chest pain syndrome.   PAST MEDICAL HISTORY:  The patient has a history of  hypercholesterolemia.  Additionally, she has migraine headaches and a  history of fibromyalgia.  She states that she has a long history of  intermittent costochondritis.   Her present medical regimen includes azithromycin and prednisone.  Chronic medications include Topamax 25 mg at bedtime and Restoril 7.5 mg  at bedtime p.r.n.   SOCIAL HISTORY:  She is married.  She is a Engineer, site, nondrinker,  nonsmoker.   FAMILY HISTORY:  Father is age 34, apparently he has had recent cardiac  surgery of unclear type.  Mother age 1, has atrial fibrillation.  One  sister has sarcoma and hypothyroidism.   The patient denies any allergies, although he is sensitive to shellfish  and eggs.   PHYSICAL EXAMINATION:  GENERAL:  A mildly overweight white female in no  acute distress.  VITAL SIGNS:  Blood pressure 124/80 and O2 saturation 98%.  SKIN:  Warm and dry without diaphoresis or rash.  HEAD/NECK:  Normal pupil responses.  Conjunctiva clear.  ENT  unremarkable.  Left TM was slightly dull.  No bruits or adenopathy.  CHEST:  Clear.  No bronchospasm.  No rhonchi.  CARDIOVASCULAR:  Normal S1 and S2.  No murmurs or gallops.  Palpation  over the anterior chest did elicit some discomfort.  ABDOMEN:  Obese,  soft, and nontender.  No organomegaly.  EXTREMITIES:  Negative.  No edema.  Peripheral pulses full.   IMPRESSION:  Atypical chest pain, low suspicion for ischemic heart  disease, upper respiratory tract infection.   DISPOSITION:  We will admit the patient to the hospital for overnight  observation and we will continue to cycle another set of enzymes and EKG  will be reviewed in the morning.  We will continue on the erythromycin  and oral prednisone and follow closely clinically.  If the patient  remains stable, we will consider early discharge and consideration on  outpatient stress test.      Gordy Savers, MD  Electronically Signed     PFK/MEDQ  D:  07/14/2008  T:  07/14/2008  Job:  [id number]

## 2011-02-17 NOTE — Group Therapy Note (Signed)
Selena [name], Selena [name]           ACCOUNT NO.:  [id number]   MEDICAL RECORD NO.:  [id number]          PATIENT TYPE:  AMB   LOCATION:  [facility]                          FACILITY:  [facility]   PHYSICIAN:  Syed T. Arfeen, M.D.   DATE OF BIRTH:  Apr 18, 1966                                 PROGRESS NOTE   The patient came in today for her followup appointment.  She is taking  her Cymbalta and dose now increased to 60 mg 10 days ago.  She has been  tolerating the medication very well.  She reported no side effects.  However, she still feels no major improvement.  She has been sleeping  well and denies any tremors.  Today, she is happy and excited as her  husband got a job.  She denies any auditory hallucinations, suicidal  thoughts or homicidal thoughts.  She denies any crying episodes.  She is  now off from venlafaxine.   ASSESSMENT:  Anxiety disorder.   PLAN:  I have recommended the patient to continue Cymbalta 60 mg daily since it  is too early to increase the dose, as she has been taking only the past  10 days.  She has been reporting no side effects, which is a good sign.  As she continues to endorse some anxiety, we will give lorazepam 0.5 mg  as needed to help those anxiety symptoms.  She has taken Xanax in the  past from her primary care doctor, which did not help her.  I explained  the risks and benefits of medication, and I will see her in 3 weeks.  We  will consider increasing Cymbalta to 90 mg if anxiety and depression  remains the same.      Syed T. Lolly Mustache, M.D.  Electronically Signed     STA/MEDQ  D:  05/20/2009  T:  05/20/2009  Job:  [id number]

## 2011-02-17 NOTE — Op Note (Signed)
[name], [name]           ACCOUNT NO.:  [id number]   MEDICAL RECORD NO.:  [id number]          PATIENT TYPE:  AMB   LOCATION:  DSC                          FACILITY:  [facility]   PHYSICIAN:  Katy Fitch. Sypher, M.D. DATE OF BIRTH:  May 20, 1966   DATE OF PROCEDURE:  01/01/2009  DATE OF DISCHARGE:                               OPERATIVE REPORT   PREOPERATIVE DIAGNOSIS:  Painful left volar ganglion presenting adjacent  to flexor carpi radialis tendon and ultimately found to be a  scaphotrapezial trapezoid joint capsular ganglion.   POSTOPERATIVE DIAGNOSIS:  Painful left volar ganglion presenting  adjacent to flexor carpi radialis tendon and ultimately found to be a  scaphotrapezial trapezoid joint capsular ganglion.   OPERATION:  Resection of left volar ganglion and debridement of  scaphotrapezial trapezoid joint capsule.   OPERATING SURGEON:  Katy Fitch. Sypher, MD   ASSISTANT:  Marveen Reeks Dasnoit, PA-C   ANESTHESIA:  General by LMA.   SUPERVISING ANESTHESIOLOGIST:  Janetta Hora. Gelene Mink, MD   INDICATIONS:  Mandee [name] is a 45 year old woman referred by  [employer], [facility] for evaluation and management of a  painful left volar wrist mass.  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  Clinical examination did not reveal evidence of significant internal  derangement of the wrist.  Plain films were unremarkable.   We recommended elective excision.  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   After informed consent, she is brought to the operating room at this  time.   PROCEDURE TECHNIQUE:  [name] was brought to the operating  room and placed in supine position on the operating table.   Following an anesthesia consult with Dr. Gelene Mink,  general anesthesia  by LMA technique was recommended and accepted.  [name] was brought  to room 2 of the [facility], placed in supine position on the  operating table and under Dr. Thornton Dales direct supervision, general  anesthesia by LMA technique induced.   The left arm was prepped with Betadine soap and solution, and sterilely  draped.  Following exsanguination of the left arm with Esmarch bandage,  the proximal brachium was inflated to 220 mmHg.  The procedure commenced  with a transverse incision directly over the palpable mass.  Subcutaneous tissues were carefully divided taking care to gently  retract the radial superficial sensory branches.  The cyst was sessile  and based distally at the STT joint capsule.  This was circumferentially  dissected off the superficial branch of the radial artery and followed  to its insertion on the capsule of the STT joint.   The entire cyst, then its neck were removed from the capsule.  The  capsule was entered with a limited arthrotomy.  A micro rongeur was used  to remove any valve.  The margins of the capsular incision were then  electrocauterized in an effort to desiccate any abnormal cells followed  by repair of the capsule with  mattress suture of 4-0 Vicryl.   The wound was then repaired with intradermal 3-0 Prolene and Steri-  Strips.  Lidocaine 2% was infiltrated as postoperative analgesia.  There  were no apparent complications.   [name] was placed in compressive dressing with volar plaster splint  maintaining the wrist in 5 degrees dorsiflexion.  For aftercare, she was  provided a prescription for Vicodin 5 mg one p.o. q.4-6 hours p.r.n.  pain 20 tablets without refill.   The sinuses Robert Sypher vein to dictation on Rosella to room and  abscess x2.  .  Pills Dr. Lisabeth Pick care of lumbar 73 packets      Katy Fitch. Sypher, M.D.  Electronically Signed     RVS/MEDQ  D:  01/01/2009  T:  01/01/2009  Job:   [id number]   cc:   Kerby Nora, MD

## 2011-03-23 ENCOUNTER — Ambulatory Visit (INDEPENDENT_AMBULATORY_CARE_PROVIDER_SITE_OTHER): Payer: BC Managed Care – PPO | Admitting: Family Medicine

## 2011-03-23 ENCOUNTER — Encounter: Payer: Self-pay | Admitting: Family Medicine

## 2011-03-23 DIAGNOSIS — J019 Acute sinusitis, unspecified: Secondary | ICD-10-CM | POA: Insufficient documentation

## 2011-03-23 MED ORDER — AMOXICILLIN-POT CLAVULANATE 875-125 MG PO TABS: 1.0000 | ORAL_TABLET | Freq: Two times a day (BID) | ORAL | Status: AC

## 2011-03-23 MED ORDER — FLUCONAZOLE 150 MG PO TABS: 150.0000 mg | ORAL_TABLET | Freq: Once | ORAL | Status: AC

## 2011-03-23 NOTE — Progress Notes (Signed)
  Subjective:    Patient ID: Selena [name], female    DOB: 04-18-1966, 44 y.o.   MRN: [id number]  HPI CC: sinus sxs?  3wk h/o sinus sxs.  Tends to get once a year.  Started with cold sxs and sinus congestion, using neti pot, mucinex, nasal saline.  Coughing up sputum.  In AM, face warm and feels sinus pressure.  + drainage.  Blowing nose with green purulent sputum.  No fevers/chills, abd pain, nausea/vomiting, rashes, chest pain/tightness, SOB, cough.  No sick contacts at home, no smokers at home.  H/o exercise induced asthma.  Recent trip to [facility], returned this weekend.  Requests diflucan for yeast infection while on antibiotics.  Review of Systems per HPI   Objective:   Physical Exam  Nursing note and vitals reviewed. Constitutional: She appears well-developed and well-nourished. No distress.  HENT:  Head: Normocephalic and atraumatic.  Right Ear: Hearing, tympanic membrane, external ear and ear canal normal.  Left Ear: Hearing, tympanic membrane, external ear and ear canal normal.  Nose: No mucosal edema or rhinorrhea. Right sinus exhibits maxillary sinus tenderness. Right sinus exhibits no frontal sinus tenderness. Left sinus exhibits maxillary sinus tenderness. Left sinus exhibits no frontal sinus tenderness.  Mouth/Throat: Uvula is midline, oropharynx is clear and moist and mucous membranes are normal. No oropharyngeal exudate.  Eyes: Conjunctivae and EOM are normal. Pupils are equal, round, and reactive to light. No scleral icterus.  Neck: Normal range of motion. Neck supple. No JVD present. No thyromegaly present.  Cardiovascular: Normal rate, regular rhythm, normal heart sounds and intact distal pulses.   No murmur heard. Pulmonary/Chest: Effort normal and breath sounds normal. No respiratory distress. She has no wheezes. She has no rales.  Lymphadenopathy:    She has no cervical adenopathy.  Skin: Skin is warm and dry. No rash noted.          Assessment &  Plan:

## 2011-03-23 NOTE — Assessment & Plan Note (Signed)
Going on 3 wks. Treat as acute sinusitis with 10 d course augmentin. Diflucan for yeast infection if needed after antibiotic. Advised hold statin if on diflucan, advised 2nd form birth control while on abx. If not better, consider INS.

## 2011-03-23 NOTE — Patient Instructions (Signed)
You have a sinus infection. Take medicine as prescribed: augmentin, diflucan if needed. 2nd form birth control while on antibiotic. Use yogurt on antibiotic Hold simvastatin for 1-2 days if use diflucan. Push fluids and plenty of rest. Nasal saline irrigation or neti pot to help drain sinuses. May use simple mucinex with plenty of fluid to help mobilize mucous. Return if fever >101.5, trouble opening/closing mouth, difficulty swallowing, or worsening.

## 2011-04-07 ENCOUNTER — Other Ambulatory Visit: Payer: Self-pay | Admitting: Family Medicine

## 2011-04-15 ENCOUNTER — Other Ambulatory Visit: Payer: Self-pay | Admitting: Family Medicine

## 2011-04-15 NOTE — Telephone Encounter (Signed)
Patient not seen for physical in over 1 year 

## 2011-04-16 NOTE — Telephone Encounter (Signed)
I believe she goes to GYN.

## 2011-04-21 ENCOUNTER — Other Ambulatory Visit (INDEPENDENT_AMBULATORY_CARE_PROVIDER_SITE_OTHER): Payer: BC Managed Care – PPO | Admitting: Family Medicine

## 2011-04-21 ENCOUNTER — Telehealth: Payer: Self-pay | Admitting: Family Medicine

## 2011-04-21 DIAGNOSIS — E785 Hyperlipidemia, unspecified: Secondary | ICD-10-CM

## 2011-04-21 DIAGNOSIS — R7309 Other abnormal glucose: Secondary | ICD-10-CM

## 2011-04-21 DIAGNOSIS — D509 Iron deficiency anemia, unspecified: Secondary | ICD-10-CM

## 2011-04-21 DIAGNOSIS — I10 Essential (primary) hypertension: Secondary | ICD-10-CM

## 2011-04-21 LAB — LIPID PANEL
Cholesterol: 185 mg/dL (ref 0–200)
HDL: 47.3 mg/dL (ref >39.00)
LDL Cholesterol: 102 mg/dL — ABNORMAL HIGH (ref 0–99)
Total CHOL/HDL Ratio: 4
Triglycerides: 180 mg/dL — ABNORMAL HIGH (ref 0.0–149.0)
VLDL: 36 mg/dL (ref 0.0–40.0)

## 2011-04-21 LAB — CBC WITH DIFFERENTIAL/PLATELET
Basophils Absolute: 0 10*3/uL (ref 0.0–0.1)
Basophils Relative: 0.5 % (ref 0.0–3.0)
Eosinophils Absolute: 0.2 10*3/uL (ref 0.0–0.7)
Eosinophils Relative: 1.9 % (ref 0.0–5.0)
HCT: 40.1 % (ref 36.0–46.0)
Hemoglobin: 13.8 g/dL (ref 12.0–15.0)
Lymphocytes Relative: 24.4 % (ref 12.0–46.0)
Lymphs Abs: 2.2 10*3/uL (ref 0.7–4.0)
MCHC: 34.3 g/dL (ref 30.0–36.0)
MCV: 93.9 fl (ref 78.0–100.0)
Monocytes Absolute: 0.4 10*3/uL (ref 0.1–1.0)
Monocytes Relative: 4.9 % (ref 3.0–12.0)
Neutro Abs: 6.2 10*3/uL (ref 1.4–7.7)
Neutrophils Relative %: 68.3 % (ref 43.0–77.0)
Platelets: 291 10*3/uL (ref 150.0–400.0)
RBC: 4.28 Mil/uL (ref 3.87–5.11)
RDW: 12.9 % (ref 11.5–14.6)
WBC: 9.1 10*3/uL (ref 4.5–10.5)

## 2011-04-21 LAB — COMPREHENSIVE METABOLIC PANEL
ALT: 20 U/L (ref 0–35)
AST: 23 U/L (ref 0–37)
Albumin: 3.8 g/dL (ref 3.5–5.2)
Alkaline Phosphatase: 52 U/L (ref 39–117)
BUN: 11 mg/dL (ref 6–23)
CO2: 25 mEq/L (ref 19–32)
Calcium: 8.8 mg/dL (ref 8.4–10.5)
Chloride: 108 mEq/L (ref 96–112)
Creatinine, Ser: 0.8 mg/dL (ref 0.4–1.2)
GFR: 80.86 mL/min (ref >60.00)
Glucose, Bld: 106 mg/dL — ABNORMAL HIGH (ref 70–99)
Potassium: 3.8 mEq/L (ref 3.5–5.1)
Sodium: 138 mEq/L (ref 135–145)
Total Bilirubin: 0.6 mg/dL (ref 0.3–1.2)
Total Protein: 6.9 g/dL (ref 6.0–8.3)

## 2011-04-21 NOTE — Telephone Encounter (Signed)
Message copied by Excell Seltzer on Tue Apr 21, 2011  8:06 AM ------      Message from: Alvina Chou      Created: Fri Apr 17, 2011 11:00 AM       Patient is scheduled for Tuesday, CPX labs, please order future labs, Thanks , Camelia Eng

## 2011-04-22 ENCOUNTER — Ambulatory Visit (HOSPITAL_COMMUNITY): Payer: BC Managed Care – PPO | Admitting: Marriage and Family Therapist

## 2011-04-22 DIAGNOSIS — F411 Generalized anxiety disorder: Secondary | ICD-10-CM

## 2011-04-24 ENCOUNTER — Encounter: Payer: Self-pay | Admitting: Family Medicine

## 2011-04-24 ENCOUNTER — Ambulatory Visit (INDEPENDENT_AMBULATORY_CARE_PROVIDER_SITE_OTHER): Payer: BC Managed Care – PPO | Admitting: Family Medicine

## 2011-04-24 DIAGNOSIS — R1313 Dysphagia, pharyngeal phase: Secondary | ICD-10-CM

## 2011-04-24 DIAGNOSIS — J309 Allergic rhinitis, unspecified: Secondary | ICD-10-CM | POA: Insufficient documentation

## 2011-04-24 DIAGNOSIS — F341 Dysthymic disorder: Secondary | ICD-10-CM

## 2011-04-24 DIAGNOSIS — I10 Essential (primary) hypertension: Secondary | ICD-10-CM

## 2011-04-24 DIAGNOSIS — E785 Hyperlipidemia, unspecified: Secondary | ICD-10-CM

## 2011-04-24 DIAGNOSIS — R7309 Other abnormal glucose: Secondary | ICD-10-CM

## 2011-04-24 LAB — TSH: TSH: 0.65 u[IU]/mL (ref 0.35–5.50)

## 2011-04-24 NOTE — Assessment & Plan Note (Signed)
LDl at goal , but trig high. Start fish oil daily. Encouraged exercise, weight loss, healthy eating habits.

## 2011-04-24 NOTE — Patient Instructions (Addendum)
Stop loratidine. Start zyrtec  10 mg and bedtime and restart flonase 2 sprays per nostril daily. Consider starting fish oil 2000 mg divided daily. Increase omeprazole to 2 tabs daily... If symptoms not improving call me. Follow up in 3 months for 30 min appt.

## 2011-04-24 NOTE — Assessment & Plan Note (Signed)
No improvement. Counseled pt on weight loss, increasing exercise further and low carb diet. Info given.

## 2011-04-24 NOTE — Assessment & Plan Note (Signed)
No current sinus infeciton, but continued congestion. Add back flonase and start zyrtec at bedtime. Follow up if not improving in 1-2 weeks.

## 2011-04-24 NOTE — Progress Notes (Signed)
Subjective:    Patient ID: Selena [name], female    DOB: 1966-10-02, 45 y.o.   MRN: [id number]  HPI  Here for yearly chronic health issue maintanace.  Sees GYN for  CPX, pelvic and pap.  Hypertension:   Well controlled on no medication  Using medication without problems or lightheadedness:  Chest pain with exertion: None Edema:None Short of breath:None Average home NWG:NFAO checking Other issues: Has been walking on treadmil 3-4 times a week. Has been trying to eat healthy.  Elevated Cholesterol: LDL at goal < 130 on zocor 20 mg daily,but  triglycerides high Using medications without problems:Yes Muscle aches: None from med. Other complaints:  Prediabetes: Stop metformin for PCOS because thought was causing weight gain.  glucose remains high.. At 106.   Depression/anxiety:  Moderate control on effexor and klonopin prn. Seeing counslor for anxiety.  Fibromyalgia: Sees Dr. Corliss Skains.No recent changes.  She has started having episodes again of feeling like she cannot breath when lying flat, choking sensation.  Cough when she eats.  Restarted omeprazole 10 days ago, no change since restarting this. Had endoscopy last year.. Had esophageal dilation, GERD.  Sinus infection...seen 6/18,s/p augmentin. Still coughing up mucus, nasal congestion, post nasal drip. No facial pain. No fever. Taking loratidine daily.     Review of Systems  Constitutional: Negative for fever and fatigue.  HENT: Positive for nosebleeds, congestion, rhinorrhea and postnasal drip. Negative for ear pain.   Eyes: Negative for pain.  Respiratory: Positive for cough and choking. Negative for chest tightness, shortness of breath and wheezing.   Cardiovascular: Negative for chest pain, palpitations and leg swelling.  Gastrointestinal: Negative for abdominal pain, diarrhea, constipation, blood in stool and anal bleeding.  Genitourinary: Negative for dysuria.       Objective:   Physical Exam    Constitutional: Vital signs are normal. She appears well-developed and well-nourished. She is cooperative.  Non-toxic appearance. She does not appear ill. No distress.       Morbidly obese female in NAD  HENT:  Head: Normocephalic.  Right Ear: Hearing, tympanic membrane, external ear and ear canal normal. Tympanic membrane is not erythematous, not retracted and not bulging.  Left Ear: Hearing, tympanic membrane, external ear and ear canal normal. Tympanic membrane is not erythematous, not retracted and not bulging.  Nose: Mucosal edema and rhinorrhea present. Right sinus exhibits no maxillary sinus tenderness and no frontal sinus tenderness. Left sinus exhibits no maxillary sinus tenderness and no frontal sinus tenderness.  Mouth/Throat: Uvula is midline, oropharynx is clear and moist and mucous membranes are normal.  Eyes: Conjunctivae, EOM and lids are normal. Pupils are equal, round, and reactive to light. No foreign bodies found.  Neck: Trachea normal and normal range of motion. Neck supple. Carotid bruit is not present. No mass and no thyromegaly present.  Cardiovascular: Normal rate, regular rhythm, S1 normal, S2 normal, normal heart sounds, intact distal pulses and normal pulses.  Exam reveals no gallop and no friction rub.   No murmur heard. Pulmonary/Chest: Effort normal and breath sounds normal. Not tachypneic. No respiratory distress. She has no decreased breath sounds. She has no wheezes. She has no rhonchi. She has no rales.  Abdominal: Soft. Bowel sounds are normal. She exhibits no mass. There is no tenderness. There is no rebound and no guarding.  Genitourinary: Vagina normal and uterus normal.  Musculoskeletal: Normal range of motion.  Neurological: She is alert.  Skin: Skin is warm, dry and intact. No rash noted.  Psychiatric: She has  a normal mood and affect. Her speech is normal and behavior is normal. Judgment and thought content normal. Her mood appears not anxious. Cognition  and memory are normal. She does not exhibit a depressed mood.          Assessment & Plan:

## 2011-04-24 NOTE — Assessment & Plan Note (Signed)
And sensation of SOB lying flat. Most likely due to GERD. Increase omeprazole to 40 mg daily. May also be due to obesity and lung restriction lying down. Continue work on weight loss.

## 2011-04-24 NOTE — Assessment & Plan Note (Addendum)
Well controlled 

## 2011-04-30 ENCOUNTER — Encounter (HOSPITAL_COMMUNITY): Payer: BC Managed Care – PPO | Admitting: Marriage and Family Therapist

## 2011-04-30 DIAGNOSIS — F411 Generalized anxiety disorder: Secondary | ICD-10-CM

## 2011-05-01 ENCOUNTER — Telehealth: Payer: Self-pay | Admitting: *Deleted

## 2011-05-01 MED ORDER — ESOMEPRAZOLE MAGNESIUM 40 MG PO PACK: 40.0000 mg | PACK | Freq: Every day | ORAL | Status: DC

## 2011-05-01 NOTE — Telephone Encounter (Signed)
Patient advised.

## 2011-05-01 NOTE — Telephone Encounter (Signed)
Stop omeprazole.. Will send in nexium. Let me know if still no improvement in 2 weeks.

## 2011-05-01 NOTE — Telephone Encounter (Signed)
Patient has been taking the omeprazole 40 mg. She says that it really doesn't seem to help her. She is still getting the cough that burns that she talked to you about. She is asking if she needs to try something else. [facility]ses [facility].

## 2011-05-13 ENCOUNTER — Telehealth: Payer: Self-pay | Admitting: *Deleted

## 2011-05-13 NOTE — Telephone Encounter (Signed)
Patient has a "Health Care Nurse" that helps through her insurance who wants her to ask this question.  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  It was stretched at that time.  Does this mean that it should be stretched regularly?  Patient is aware you will not be in the office until next week and she will not look to hear from [place] until that time.  She just needs to have this information ready to report to the nurse when she returns the call in a couple of weeks.

## 2011-05-13 NOTE — Telephone Encounter (Signed)
No - not unless symptomatic and has trouble swallowing, things getting caught in throat

## 2011-05-15 NOTE — Telephone Encounter (Signed)
Patient advised.

## 2011-05-27 ENCOUNTER — Other Ambulatory Visit: Payer: Self-pay | Admitting: Family Medicine

## 2011-06-01 ENCOUNTER — Telehealth: Payer: Self-pay | Admitting: *Deleted

## 2011-06-01 NOTE — Telephone Encounter (Signed)
Patient called stating that the Effexor that she is on is not working. Patient stated that she had a bad weekend and had to take a Klonopin during the day which she normally takes it only at night.  Patient wants to know if you can call something else in for her? Patient is aware that Dr. Ermalene Searing is out and stated that it is okay for message to wait until Thursday. Pharmacy-[facility]

## 2011-06-02 NOTE — Telephone Encounter (Signed)
Patient is open to trying cymbalta she hasnt tried that yet.

## 2011-06-02 NOTE — Telephone Encounter (Signed)
Has tried max dose effexor, SRRI and amitryptiline isn past without effect or SE. Let pt know we can try Cymbalta or wellbutrin... Check to see if she has tried this before, I could not see in past notes.  Given difficulty to find med that works for her we could also refer her instead to a phychiatrist for med adjustment. Let me know.

## 2011-07-07 LAB — URINALYSIS, ROUTINE W REFLEX MICROSCOPIC
Bilirubin Urine: NEGATIVE
Glucose, UA: NEGATIVE
Hgb urine dipstick: NEGATIVE
Ketones, ur: NEGATIVE
Nitrite: NEGATIVE
Protein, ur: NEGATIVE
Specific Gravity, Urine: 1.011
Urobilinogen, UA: 0.2
pH: 7.5

## 2011-07-07 LAB — LIPID PANEL
Cholesterol: 213 — ABNORMAL HIGH
HDL: 40
LDL Cholesterol: 153 — ABNORMAL HIGH
Total CHOL/HDL Ratio: 5.3
Triglycerides: 101
VLDL: 20

## 2011-07-07 LAB — POCT I-STAT, CHEM 8
BUN: 10
Calcium, Ion: 1.21
Chloride: 112
Creatinine, Ser: 0.7
Glucose, Bld: 130 — ABNORMAL HIGH
HCT: 41
Hemoglobin: 13.9
Potassium: 4
Sodium: 139
TCO2: 19

## 2011-07-07 LAB — CBC
HCT: 40.3
Hemoglobin: 13.8
MCHC: 34.2
MCV: 93.1
Platelets: 325
RBC: 4.33
RDW: 12.9
WBC: 12.5 — ABNORMAL HIGH

## 2011-07-07 LAB — DIFFERENTIAL
Basophils Absolute: 0
Basophils Relative: 0
Eosinophils Absolute: 0
Eosinophils Relative: 0
Lymphocytes Relative: 11 — ABNORMAL LOW
Lymphs Abs: 1.3
Monocytes Absolute: 0.4
Monocytes Relative: 3
Neutro Abs: 10.8 — ABNORMAL HIGH
Neutrophils Relative %: 86 — ABNORMAL HIGH

## 2011-07-07 LAB — TROPONIN I: Troponin I: 0.04

## 2011-07-07 LAB — POCT CARDIAC MARKERS
CKMB, poc: 3.8
CKMB, poc: 4.1
Myoglobin, poc: 110
Myoglobin, poc: 93.6
Troponin i, poc: <0.05
Troponin i, poc: <0.05

## 2011-07-07 LAB — BASIC METABOLIC PANEL
BUN: 11
CO2: 20
Calcium: 9
Chloride: 111
Creatinine, Ser: 0.78
GFR calc Af Amer: >60
GFR calc non Af Amer: >60
Glucose, Bld: 132 — ABNORMAL HIGH
Potassium: 3.9
Sodium: 138

## 2011-07-07 LAB — CARDIAC PANEL(CRET KIN+CKTOT+MB+TROPI)
CK, MB: 3
Relative Index: 1.9
Total CK: 154
Troponin I: 0.01

## 2011-07-07 LAB — PROTIME-INR
INR: 1
Prothrombin Time: 13.1

## 2011-07-07 LAB — CK TOTAL AND CKMB (NOT AT ARMC)
CK, MB: 3.6
Relative Index: 2
Total CK: 184 — ABNORMAL HIGH

## 2011-07-07 LAB — POCT PREGNANCY, URINE: Preg Test, Ur: NEGATIVE

## 2011-07-14 ENCOUNTER — Encounter: Payer: Self-pay | Admitting: Family Medicine

## 2011-07-14 ENCOUNTER — Ambulatory Visit (INDEPENDENT_AMBULATORY_CARE_PROVIDER_SITE_OTHER): Payer: BC Managed Care – PPO | Admitting: Family Medicine

## 2011-07-14 VITALS — BP 110/80 | HR 90 | Temp 98.8°F | Wt 238.2 lb

## 2011-07-14 DIAGNOSIS — J069 Acute upper respiratory infection, unspecified: Secondary | ICD-10-CM

## 2011-07-14 MED ORDER — ALBUTEROL SULFATE HFA 108 (90 BASE) MCG/ACT IN AERS: 2.0000 | INHALATION_SPRAY | Freq: Four times a day (QID) | RESPIRATORY_TRACT | Status: DC | PRN

## 2011-07-14 NOTE — Patient Instructions (Signed)
Ventolin prn Cough meds prn

## 2011-07-14 NOTE — Progress Notes (Signed)
  Subjective:    Patient ID: Selena [name], female    DOB: 01-17-1966, 45 y.o.   MRN: [id number]  HPI  Selena [name], a 45 y.o. female presents today in the office for the following:    Coughing and wheezing --- has been ongoing for more than a week. Last night coughed until threw up.  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 She is afebrile. No generalized aches or arthralgias. No significant sore throat. H/o EIB  The PMH, PSH, Social History, Family History, Medications, and allergies have been reviewed in [facility], and have been updated if relevant.   Review of Systems ROS: GEN: Acute illness details above GI: Tolerating PO intake GU: maintaining adequate hydration and urination Pulm: No SOB Interactive and getting along well at home.  Otherwise, ROS is as per the HPI.     Objective:   Physical Exam   Physical Exam  Blood pressure 110/80, pulse 90, temperature 98.8 F (37.1 C), temperature source Oral, weight 238 lb 4 oz (108.069 kg), last menstrual period 06/18/2011.  GEN: A and O x 3. WDWN. NAD.    ENT: Nose clear, ext NML.  No LAD.  No JVD.  TM's clear. Oropharynx clear PULM: Normal WOB, no distress. No crackles. Minimal wheeze CV: RRR, no M/G/R, No rubs, No JVD.   ABD: S, NT, ND, + BS. No rebound. No guarding. No HSM.   EXT: warm and well-perfused, No c/c/e. PSYCH: Pleasant and conversant.       Assessment & Plan:   1. URI (upper respiratory infection)      URI with mild bronchospasm. Albuterol when necessary and supportive care per

## 2011-07-20 ENCOUNTER — Telehealth: Payer: Self-pay | Admitting: *Deleted

## 2011-07-20 MED ORDER — AZITHROMYCIN 250 MG PO TABS: ORAL_TABLET | ORAL | Status: AC

## 2011-07-20 NOTE — Telephone Encounter (Signed)
Patient calls and says she saw doctor copland last week for cough and wheezing she says he told her she would be better in 10 days she in now on day 15 and not any better please advise on what to do next

## 2011-07-20 NOTE — Telephone Encounter (Signed)
Patient advised.

## 2011-07-20 NOTE — Telephone Encounter (Signed)
Will treat as bacterial infection... Given lasting >2 weeks. Start a zpak for infection. If wheeze not improving or shortness of breath increasing in next 24-48 hours... She needs to make an appt to be seen for  Reeval.

## 2011-07-30 ENCOUNTER — Ambulatory Visit: Payer: BC Managed Care – PPO | Admitting: Family Medicine

## 2011-08-11 ENCOUNTER — Telehealth: Payer: Self-pay | Admitting: *Deleted

## 2011-08-11 MED ORDER — RIZATRIPTAN BENZOATE 10 MG PO TABS: 10.0000 mg | ORAL_TABLET | ORAL | Status: DC | PRN

## 2011-08-11 NOTE — Telephone Encounter (Signed)
Patient advised via voicemail

## 2011-08-11 NOTE — Telephone Encounter (Signed)
Patient called stating that she is on day 9 of a migraine and is out of migraine medication. Patient states that she has not had a migraine in a while. Patient states that she has not had any success with Imitrex but has been on Maxalt and has tried Neurontin in the past. Patient would like something called in for her migraine. Patient states that she has seen Dr. Ermalene Searing and Dr. Patsy Lager. Pharmacy [facility]

## 2011-08-11 NOTE — Telephone Encounter (Signed)
Sent in some maxalt  If her symptoms persist, should f/u with Dr. B who is very good with migraine management

## 2011-08-17 ENCOUNTER — Encounter: Payer: Self-pay | Admitting: Family Medicine

## 2011-08-17 ENCOUNTER — Ambulatory Visit (INDEPENDENT_AMBULATORY_CARE_PROVIDER_SITE_OTHER): Payer: BC Managed Care – PPO | Admitting: Family Medicine

## 2011-08-17 DIAGNOSIS — R1011 Right upper quadrant pain: Secondary | ICD-10-CM

## 2011-08-17 DIAGNOSIS — R110 Nausea: Secondary | ICD-10-CM

## 2011-08-17 MED ORDER — ONDANSETRON HCL 4 MG PO TABS: 4.0000 mg | ORAL_TABLET | Freq: Three times a day (TID) | ORAL | Status: DC | PRN

## 2011-08-17 NOTE — Progress Notes (Signed)
New problem.  Abd pain.  Going for a few weeks.  Had some nausea and then had diarrhea.  Now with pain on R side of abd and persistent nausea.  She occ gets sweaty and nausea persists.  No blood in stool, vomited twice.  She'll have to stool right after eating.  H/o cholecystectomy.  No travel.  She noted a skin lesion on R side of abd.  She stopped the meloxicam for a few days w/o relief.  Already on PPI.  PMH and SH reviewed  ROS: See HPI, otherwise noncontributory.  Meds, vitals, and allergies reviewed.   GEN: nad, alert and oriented HEENT: mucous membranes moist NECK: supple w/o LA CV: rrr PULM: ctab, no inc wob ABD: soft, +bs, ttp in RUQ w/o rebound but not ttp o/w, normal BS, obese, the skin lesion in questions feels like a small rubbery lesion that moves with the skin, c/w a lipoma in the RUQ EXT: no edema SKIN: no acute rash

## 2011-08-17 NOTE — Patient Instructions (Signed)
Take zofran as needed for nausea.  You can get your results through our phone system.  Follow the instructions on the blue card. See Shirlee Limerick about your referral before you leave today. Take care.

## 2011-08-18 ENCOUNTER — Other Ambulatory Visit: Payer: Self-pay | Admitting: *Deleted

## 2011-08-18 ENCOUNTER — Encounter: Payer: Self-pay | Admitting: Family Medicine

## 2011-08-18 DIAGNOSIS — R1011 Right upper quadrant pain: Secondary | ICD-10-CM | POA: Insufficient documentation

## 2011-08-18 LAB — HEPATIC FUNCTION PANEL
ALT: 32 U/L (ref 0–35)
AST: 25 U/L (ref 0–37)
Albumin: 3.7 g/dL (ref 3.5–5.2)
Alkaline Phosphatase: 50 U/L (ref 39–117)
Bilirubin, Direct: 0 mg/dL (ref 0.0–0.3)
Total Bilirubin: 0.5 mg/dL (ref 0.3–1.2)
Total Protein: 6.9 g/dL (ref 6.0–8.3)

## 2011-08-18 MED ORDER — CLONAZEPAM 1 MG PO TABS: 1.0000 mg | ORAL_TABLET | Freq: Every day | ORAL | Status: DC

## 2011-08-18 NOTE — Assessment & Plan Note (Signed)
New problem.  Unclear source.  With nausea.  Use zofran prn for nausea.  Continue to hold nsaid but continue PPI in meantime.  Check ruq u/s given the exam, but she does have h/o cholecystectomy.  Nonotoxic, okay for outpt f/u.  Will notify pt when LFTs resulted along with u/s.  She agrees.  i think the likely lipoma is incidental and this was d/w pt. She has a mirena and no pelvic pain.  No lower abd pain/tenderness.

## 2011-08-18 NOTE — Progress Notes (Signed)
Addended by: Lars Mage on: 08/18/2011 10:16 AM   Modules accepted: Orders

## 2011-08-18 NOTE — Telephone Encounter (Signed)
rx refilled at Dr. Albertina Senegal request and faxed to [facility]

## 2011-08-20 ENCOUNTER — Ambulatory Visit
Admission: RE | Admit: 2011-08-20 | Discharge: 2011-08-20 | Disposition: A | Discharge status: Home / Self Care | Payer: BC Managed Care – PPO | Source: Ambulatory Visit | Attending: Family Medicine | Admitting: Family Medicine

## 2011-08-20 ENCOUNTER — Other Ambulatory Visit: Payer: Self-pay | Admitting: Family Medicine

## 2011-08-20 ENCOUNTER — Encounter: Payer: Self-pay | Admitting: Family Medicine

## 2011-08-20 DIAGNOSIS — R1011 Right upper quadrant pain: Secondary | ICD-10-CM

## 2011-08-20 DIAGNOSIS — K760 Fatty (change of) liver, not elsewhere classified: Secondary | ICD-10-CM | POA: Insufficient documentation

## 2011-08-20 DIAGNOSIS — R110 Nausea: Secondary | ICD-10-CM

## 2011-08-25 ENCOUNTER — Ambulatory Visit: Payer: BC Managed Care – PPO | Admitting: Gastroenterology

## 2011-08-31 ENCOUNTER — Ambulatory Visit (HOSPITAL_COMMUNITY): Payer: BC Managed Care – PPO | Admitting: Physician Assistant

## 2011-09-07 ENCOUNTER — Encounter: Payer: Self-pay | Admitting: Family Medicine

## 2011-09-07 ENCOUNTER — Ambulatory Visit (INDEPENDENT_AMBULATORY_CARE_PROVIDER_SITE_OTHER): Payer: BC Managed Care – PPO | Admitting: Family Medicine

## 2011-09-07 ENCOUNTER — Telehealth: Payer: Self-pay

## 2011-09-07 VITALS — BP 120/76 | HR 92 | Temp 98.1°F | Ht 62.0 in | Wt 231.8 lb

## 2011-09-07 DIAGNOSIS — L0291 Cutaneous abscess, unspecified: Secondary | ICD-10-CM

## 2011-09-07 DIAGNOSIS — L0390 Cellulitis, unspecified: Secondary | ICD-10-CM | POA: Insufficient documentation

## 2011-09-07 MED ORDER — MUPIROCIN 2 % EX OINT: TOPICAL_OINTMENT | CUTANEOUS | Status: DC

## 2011-09-07 MED ORDER — DOXYCYCLINE HYCLATE 100 MG PO TABS: 100.0000 mg | ORAL_TABLET | Freq: Two times a day (BID) | ORAL | Status: AC

## 2011-09-07 NOTE — Assessment & Plan Note (Signed)
Few very mild infected looking papules behind L earlobe and around jaw line  She works in a school - want to empirically cover for MRSA All sulfa/ will tx with doxy and update  Also bactroban to areas tid  inst to update if worse or not improving

## 2011-09-07 NOTE — Patient Instructions (Signed)
I think you have a mild skin infection  Take the doxycycline as directed with food that has no dairy  Use the bactroban ointment three times a day - on all papules/ areas of redness Try not to touch the area  If painful- can use a warm compress  If fever or worse- call  I sent px to the pharmacy

## 2011-09-07 NOTE — Progress Notes (Signed)
Subjective:    Patient ID: Selena [name], female    DOB: 09-17-1966, 45 y.o.   MRN: [id number]  HPI Has a lump in back of her ear starting last wed/ Thursday- has become warm/ red and painful  No fever  Ear itself does not hurt  No uri symptoms  Feels fine Piercing is normal - no signs of infection  Patient Active Problem List  Diagnoses  . PITUITARY ADENOMA, BENIGN  . HYPERLIPIDEMIA  . OBESITY, UNSPECIFIED  . ANEMIA-IRON DEFICIENCY  . DEPRESSION/ANXIETY  . COMMON MIGRAINE  . HYPERTENSION  . ASTHMA, EXERCISE INDUCED  . PREMENSTRUAL DYSPHORIC SYNDROME  . ACNE VULGARIS  . KNEE PAIN, BILATERAL  . FIBROMYALGIA  . CARDIAC MURMUR  . PREDIABETES  . HEAT INTOLERANCE  . PARESTHESIA, HANDS  . DYSPHAGIA PHARYNGEAL PHASE  . Allergic rhinitis  . RUQ abdominal pain  . Fatty liver  . Cellulitis   Past Medical History  Diagnosis Date  . Anemia, iron deficiency   . Depression   . Hyperlipidemia   . Hypertension   . Anxiety   . Gallstones   . Fibromyalgia    Past Surgical History  Procedure Date  . 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 Gallbladder surgery 2001  . Tonsillectomy 1989   History  Substance Use Topics  . Smoking status: Never Smoker   . Smokeless tobacco: Not on file  . Alcohol Use: Yes     4 Times a month   Family History  Problem Relation Age of Onset  . Hyperlipidemia Mother   . Heart disease Mother     Atrial fibrilation  . Cancer Mother     ? Melanoma  . Diabetes Father   . Mental illness Father     Bipolar  . Cancer Sister     Clear cell sarcoma in leg  . Hypothyroidism Sister   . Hypertension Maternal Grandmother   . Heart disease Maternal Grandmother     Afib  . Arthritis Paternal Grandmother    Allergies  Allergen Reactions  . Sulfonamide Derivatives     REACTION: Itchy  . Topiramate     REACTION: bad reaction. put in hospital   Current Outpatient Prescriptions on File Prior to Visit  Medication Sig Dispense  Refill  . cetirizine (ZYRTEC) 10 MG tablet Take 10 mg by mouth daily.        . clonazePAM (KLONOPIN) 1 MG tablet Take 1 tablet (1 mg total) by mouth at bedtime.  30 tablet  0  . meloxicam (MOBIC) 15 MG tablet TAKE 1 TABLET BY MOUTH ONCE A DAY  30 tablet  3  . rizatriptan (MAXALT) 10 MG tablet Take 1 tablet (10 mg total) by mouth as needed for migraine. May repeat in 2 hours if needed  10 tablet  1  . simvastatin (ZOCOR) 20 MG tablet TAKE 1 TABLET BY MOUTH DAILY  30 tablet  3  . venlafaxine (EFFEXOR-XR) 150 MG 24 hr capsule Take 1 capsule (150 mg total) by mouth daily.  30 capsule  6  . esomeprazole (NEXIUM) 40 MG packet Take 40 mg by mouth daily before breakfast. As needed.       . ondansetron (ZOFRAN) 4 MG tablet Take 1 tablet (4 mg total) by mouth every 8 (eight) hours as needed for nausea.  20 tablet  1        Review of Systems Review of Systems  Constitutional: Negative for fever, appetite change, fatigue and unexpected weight change.  Eyes: Negative for pain and visual disturbance.  ENT neg for runny nose/ st or sinus pain or inner ear pain  Respiratory: Negative for cough and shortness of breath.   Cardiovascular: Negative for cp or palpitations    Gastrointestinal: Negative for nausea, diarrhea and constipation.  Genitourinary: Negative for urgency and frequency.  Skin: Negative for pallor and pos for lesions/ no itching  Neurological: Negative for weakness, light-headedness, numbness and headaches.  Hematological: Negative for adenopathy. Does not bruise/bleed easily.  Psychiatric/Behavioral: Negative for dysphoric mood. The patient is not nervous/anxious.         Objective:   Physical Exam  Constitutional: She appears well-developed and well-nourished. No distress.  HENT:  Head: Normocephalic and atraumatic.  Right Ear: External ear normal.  Left Ear: External ear normal.  Nose: Nose normal.  Mouth/Throat: Oropharynx is clear and moist.       No sinus tenderness    Papules around L ear and jaw- see skin exam  Eyes: Conjunctivae and EOM are normal. Pupils are equal, round, and reactive to light. Right eye exhibits no discharge. Left eye exhibits no discharge.  Neck: Normal range of motion. Neck supple.       Few shotty L ant cervical LNs  Cardiovascular: Normal rate, regular rhythm and normal heart sounds.   Pulmonary/Chest: Effort normal and breath sounds normal.  Lymphadenopathy:    She has cervical adenopathy.  Neurological: She is alert.  Skin: Skin is warm and dry. There is erythema. No pallor.       1 papule behind L ear and 2 on L jaw Not vesicles  Pink in color - none are draining  Resemble pimples  No skin changes in ear canal Piercing looks normal   Psychiatric: She has a normal mood and affect.          Assessment & Plan:

## 2011-09-07 NOTE — Telephone Encounter (Signed)
[facility] Triage Call Report Triage Record Num: [id number] Operator: Selena [name] Patient Name: Selena [name] Call Date & Time: 09/05/2011 10:26:49AM Patient Phone: [phone] PCP: Selena [name] Patient Gender: Female PCP Fax : [phone] Patient DOB: 01-19-1966 Practice Name: Selena [name] Reason for Call: Caller: Selena [name]/Patient; PCP: Selena [name].; CB#: [phone]; Call Reason: Jaw Symptoms; Sx Onset: 09/03/2011; Sx Notes: Lump noted under L ear/jaw line, from cheekbone down past the jaw to the back of the neck. Area is tender, red, ans warm to touch. Area is 2.5 in x 2 in and goes in the neck area. Denies toothache. Per protocol, advised being seen within 4 hours; Afebrile; Wt: ; Guideline Used: Neck Lump/Swelling; Disp: See/4; Appt Scheduled?: Appts full; advised UC. States will go to [facility]. Protocol(s) Used: Neck Lump or Swelling Recommended Outcome per Protocol: See Provider within 4 hours Reason for Outcome: New signs and symptoms of local infection Care Advice: ~ Call provider if symptoms worsen or new symptoms develop. ~ Wash the affected area 2-3 times a day with a mild soap. ~ All neck masses and swelling must be evaluated by provider. Call EMS 911 if sudden onset or sudden worsening of breathing problems, struggling to breathe, high pitched noise when breathing in (stridor), unable to speak, grasping at throat, or panic/anxiety because of breathing problems. ~ During pregnancy, call provider if temperature is 100 F (37.7 C) or greater OR any temperature elevation for 3 days even while taking acetaminophen. ~ ~ SYMPTOM / CONDITION MANAGEMENT Prevent the spread of infection by not sharing any personal items, avoiding skin contact, and washing hands briskly using warm water and soap for at least 15 seconds, or use a 62% alcohol-based hand sanitizer. ~ ~ CAUTIONS ~ List, or take, all current prescription(s), nonprescription or alternative  medication(s) to provider for evaluation. Analgesic/Antipyretic Advice - Acetaminophen: Consider acetaminophen as directed on label or by pharmacist/provider for pain or fever PRECAUTIONS: - Use if there is no history of liver disease, alcoholism, or intake of three or more alcohol drinks per day - Only if approved by provider during pregnancy or when breastfeeding - During pregnancy, acetaminophen should not be taken more than 3 consecutive days without telling provider - Do not exceed recommended dose or frequency ~ Apply local moist heat (such as a warm, wet wash cloth covered with plastic wrap) to the area for 15-20 minutes every 2-3 hours while awake. ~ Analgesic/Antipyretic Advice - NSAIDs: Consider aspirin, ibuprofen, naproxen or ketoprofen for pain or fever as directed on label or by pharmacist/provider. PRECAUTIONS: - If over 84 years of age, should not take longer than 1 week without consulting provider. EXCEPTIONS: - Should not be used if taking blood thinners or have bleeding problems. - Do not use if have history of sensitivity/allergy to any of these medications; or history of cardiovascular, ulcer, kidney, liver disease or diabetes unless approved by provider. - Do not exceed recommended dose or frequency. ~ 09/05/2011 10:35:32AM Page 1 of 1 [email or web address]_[email or web address]_[email or web address]

## 2011-09-18 ENCOUNTER — Other Ambulatory Visit: Payer: Self-pay | Admitting: *Deleted

## 2011-09-20 MED ORDER — CLONAZEPAM 1 MG PO TABS: 1.0000 mg | ORAL_TABLET | Freq: Every day | ORAL | Status: DC

## 2011-09-21 NOTE — Telephone Encounter (Signed)
rx called to pharmacy 

## 2011-09-28 ENCOUNTER — Other Ambulatory Visit: Payer: Self-pay | Admitting: Family Medicine

## 2011-10-18 ENCOUNTER — Other Ambulatory Visit: Payer: Self-pay | Admitting: Family Medicine

## 2011-10-27 ENCOUNTER — Other Ambulatory Visit: Payer: Self-pay | Admitting: *Deleted

## 2011-10-27 MED ORDER — CLONAZEPAM 1 MG PO TABS: 1.0000 mg | ORAL_TABLET | Freq: Every day | ORAL | Status: DC

## 2011-10-27 NOTE — Telephone Encounter (Signed)
Pharmacy is requesting a new Rx be faxed or called in.

## 2011-10-28 NOTE — Telephone Encounter (Signed)
rx called to pharmacy 

## 2011-12-03 ENCOUNTER — Telehealth: Payer: Self-pay | Admitting: *Deleted

## 2011-12-03 NOTE — Telephone Encounter (Signed)
Patient called and left a message on voicemail requesting lab results regarding "gluten labs."  Called patient back on cell number to get more information and her voicemail has not been set up.  I will call back tomorrow.

## 2011-12-04 ENCOUNTER — Telehealth: Payer: Self-pay | Admitting: Family Medicine

## 2011-12-04 NOTE — Telephone Encounter (Signed)
L/M for her to call back so I can set her up at the Saturday clinic

## 2011-12-04 NOTE — Telephone Encounter (Signed)
Selena [name] can you call and set up Saturday clinic?

## 2011-12-04 NOTE — Telephone Encounter (Signed)
Triage Record Num: [id number] Operator: Chevis Pretty Patient Name: Selena [name] Call Date & Time: 12/04/2011 12:28:23PM Patient Phone: [phone] PCP: Tera Mater. Clent Ridges Patient Gender: Female PCP Fax : [phone] Patient DOB: Jul 09, 1966 Practice Name[facility] Reason for Call: Caller: Selena [name]/Patient; PCP: Excell Seltzer.; CB#: [phone]; ; ; Call regarding UTI S/Sx Urgency; Onset 12/04/11; c/o low back pain as well. Frequency, urgency; per protocol, advised appt within 4 hours. Per Epic, no appts available; info to office for appt possible workin/callback. MAY REACH PATIENT AT [phone]. Protocol(s) Used: Urinary Symptoms - Female Recommended Outcome per Protocol: See Provider within 4 hours Reason for Outcome: Urinary tract symptoms AND any flank or low back pain Care Advice: ~ Another adult should drive. ~ Tell provider medical history of renal disease; especially if have only one kidney. ~ Call provider if symptoms worsen, such as increasing pain in low back, pelvis, or side(s); blood in urine; or fever. Increase intake of fluids. Try to drink 8 oz. (.2 liter) every hour when awake, including unsweetened cranberry juice, unless on restricted fluids for other medical reasons. Take sips of fluid or eat ice chips if nauseated or vomiting. ~ ~ Tell your provider if you are taking Warfarin (Coumadin) and drinking cranberry juice or taking cranberry capsules. ~ SYMPTOM / CONDITION MANAGEMENT ~ CAUTIONS ~ List, or take, all current prescription(s), nonprescription or alternative medication(s) to provider for evaluation. Limit carbonated, alcoholic, and caffeinated beverages such as coffee, tea and soda. Avoid nonprescription cold and allergy medications that contain caffeine. Limit intake of tomatoes, fruit juices (except for unsweetened cranberry juice), dairy products, spicy foods, sugar, and artificial sweeteners (aspartame or saccharine). Stop or  decrease smoking. Reducing exposure to bladder irritants may help lessen urgency. ~ Analgesic/Antipyretic Advice - Acetaminophen: Consider acetaminophen as directed on label or by pharmacist/provider for pain or fever PRECAUTIONS: - Use if there is no history of liver disease, alcoholism, or intake of three or more alcohol drinks per day - Only if approved by provider during pregnancy or when breastfeeding - During pregnancy, acetaminophen should not be taken more than 3 consecutive days without telling provider - Do not exceed recommended dose or frequency ~ Systemic Inflammatory Response Syndrome (SIRS): Watch for signs of a generalized, whole body infection. Occurs within days of a localized infection, especially of the urinary, GI, respiratory or nervous systems; or after a traumatic injury or invasive procedure. - Call EMS 911 if symptoms have worsened, such as increasing confusion or unusual drowsiness; cold and clammy skin; no urine output; rapid respiration (>30/min.) or slow respiration (<10/min.); struggling to breathe. - Go to the ED immediately for early symptoms of rapid pulse >90/min. or rapid breathing >20/min. at rest; chills; oral temperature >100.4 F (38 C) or <96.8 F (36 C) when associated with conditions noted. ~ Analgesic/Antipyretic Advice - NSAIDs: Consider aspirin, ibuprofen, naproxen or ketoprofen for pain or fever as directed on label or by pharmacist/provider. PRECAUTIONS: - If over 28 years of age, should not take longer than 1 week without consulting provider. ~ 12/04/2011 12:33:05PM Page 1 of 2 CAN_TriageRpt_V2 [facility] Triage Call Report Patient Name: Selena [name] continuation page/s EXCEPTIONS: - Should not be used if taking blood thinners or have bleeding problems. - Do not use if have history of sensitivity/allergy to any of these medications; or history of cardiovascular, ulcer, kidney, liver disease or diabetes unless approved by  provider. - Do not exceed recommended dose or frequency. 12/04/2011 12:33:05PM Page 2 of 2 CAN_TriageRpt_V2

## 2011-12-05 ENCOUNTER — Ambulatory Visit (INDEPENDENT_AMBULATORY_CARE_PROVIDER_SITE_OTHER): Payer: BC Managed Care – PPO | Admitting: Family Medicine

## 2011-12-05 ENCOUNTER — Encounter: Payer: Self-pay | Admitting: Family Medicine

## 2011-12-05 DIAGNOSIS — M545 Low back pain, unspecified: Secondary | ICD-10-CM

## 2011-12-05 DIAGNOSIS — R3589 Other polyuria: Secondary | ICD-10-CM

## 2011-12-05 DIAGNOSIS — R358 Other polyuria: Secondary | ICD-10-CM

## 2011-12-05 LAB — POCT URINALYSIS DIPSTICK
Bilirubin, UA: NEGATIVE
Glucose, UA: NEGATIVE
Ketones, UA: NEGATIVE
Nitrite, UA: NEGATIVE
Protein, UA: NEGATIVE
Spec Grav, UA: 1.015
Urobilinogen, UA: 0.2
pH, UA: 6

## 2011-12-05 MED ORDER — CIPROFLOXACIN HCL 250 MG PO TABS: 250.0000 mg | ORAL_TABLET | Freq: Two times a day (BID) | ORAL | Status: AC

## 2011-12-05 NOTE — Assessment & Plan Note (Signed)
Story consistent with UTI.  UA mildly consistent with UTI. treat with cipro 250mg  bid x 3 days. Update [place] if not improving. See pt instructions.

## 2011-12-05 NOTE — Patient Instructions (Signed)
Looks like urinary infection - treat with cipro twice daily for 3 days. Push fluids (water and cranberry juice) Watch for fevers/chills, or worsening pain.  If that happens, let us know. Good to see you today, I hope you start feeling better.

## 2011-12-05 NOTE — Progress Notes (Signed)
  Subjective:    Patient ID: Selena [name], female    DOB: Jan 27, 1966, 46 y.o.   MRN: [id number]  HPI CC:  Polyuria  Yesterday when went to school, noticed polyuria throughout the day.  + urgency.  + abd pressure.  No dysuria.  No hematuria, fevers/chills, nausea/vomiting.  Earlier in week had R lower back pain as well.  Has been drinking cranberry juice.  H/o kidney infection in college.  No recent UTI.  No recent abx use.  Review of Systems Per HPI    Objective:   Physical Exam  Nursing note and vitals reviewed. Constitutional: She appears well-developed and well-nourished. No distress.  Abdominal: Soft. Bowel sounds are normal. She exhibits no distension. There is no tenderness. There is no rebound, no guarding and no CVA tenderness.  Skin: Skin is warm and dry. No rash noted.  Psychiatric: She has a normal mood and affect.       Assessment & Plan:

## 2011-12-07 ENCOUNTER — Ambulatory Visit (INDEPENDENT_AMBULATORY_CARE_PROVIDER_SITE_OTHER): Payer: BC Managed Care – PPO | Admitting: Family Medicine

## 2011-12-07 ENCOUNTER — Encounter: Payer: Self-pay | Admitting: Family Medicine

## 2011-12-07 ENCOUNTER — Telehealth: Payer: Self-pay | Admitting: Family Medicine

## 2011-12-07 DIAGNOSIS — M549 Dorsalgia, unspecified: Secondary | ICD-10-CM

## 2011-12-07 DIAGNOSIS — R319 Hematuria, unspecified: Secondary | ICD-10-CM

## 2011-12-07 LAB — POCT URINALYSIS DIPSTICK
Bilirubin, UA: NEGATIVE
Blood, UA: NEGATIVE
Glucose, UA: NEGATIVE
Ketones, UA: NEGATIVE
Leukocytes, UA: NEGATIVE
Nitrite, UA: NEGATIVE
Protein, UA: NEGATIVE
Spec Grav, UA: <=1.005
Urobilinogen, UA: NEGATIVE
pH, UA: 6

## 2011-12-07 MED ORDER — TIZANIDINE HCL 4 MG PO TABS: 2.0000 mg | ORAL_TABLET | Freq: Three times a day (TID) | ORAL | Status: DC | PRN

## 2011-12-07 NOTE — Patient Instructions (Signed)
Take the tizanidine every 8 hours if needed and use a heating pad for your back. This should improve.

## 2011-12-07 NOTE — Telephone Encounter (Signed)
Triage Record Num: [id number] Operator: Geanie Berlin Patient Name: Selena [name] Call Date & Time: 12/07/2011 10:44:28AM Patient Phone: [phone] PCP: Kerby Nora Patient Gender: Female PCP Fax : [phone] Patient DOB: 17-Jul-1966 Practice Name: [facility] Reason for Call: Caller: Takeshia/Patient; PCP: Excell Seltzer.; CB#: [phone]; ; Call regarding L flank /back Pain; Onset: 3/ 12/01/11. Afebrile. Seen Saturday 12/04/11; urinanylysis + blood in urine but no micorscope available. No urine culture. Treated with 3 day Cephelexin BID. Noted blood in urine 12/07/11. Mirena IUD. Notes new onset of mild abdominal pain. Some frequency present. Advised to see MD within 4 hrs for flank pain and urinary tract symptoms per Flank Pain Guideline. Appt scheduled for 12/07/11 at 1145 with Dr Para March at [facility]. Protocol(s) Used: Flank Pain Recommended Outcome per Protocol: See Provider within 4 hours Reason for Outcome: Flank pain or low back pain AND urinary tract symptoms Care Advice: ~ Call provider if symptoms worsen or new symptoms develop. Increase intake of fluids. Try to drink 8 oz. (.2 liter) every hour when awake, including unsweetened cranberry juice, unless on restricted fluids for other medical reasons. Take sips of fluid or eat ice chips if nauseated or vomiting. ~ ~ SYMPTOM / CONDITION MANAGEMENT Limit carbonated, alcoholic, and caffeinated beverages such as coffee, tea and soda. Avoid nonprescription cold and allergy medications that contain caffeine. Limit intake of tomatoes, fruit juices (except for unsweetened cranberry juice), dairy products, spicy foods, sugar, and artificial sweeteners (aspartame or saccharine). Stop or decrease smoking. Reducing exposure to bladder irritants may help lessen urgency. ~ Analgesic/Antipyretic Advice - Acetaminophen: Consider acetaminophen as directed on label or by pharmacist/provider for pain or fever PRECAUTIONS: - Use  if there is no history of liver disease, alcoholism, or intake of three or more alcohol drinks per day - Only if approved by provider during pregnancy or when breastfeeding - During pregnancy, acetaminophen should not be taken more than 3 consecutive days without telling provider - Do not exceed recommended dose or frequency ~ Systemic Inflammatory Response Syndrome (SIRS): Watch for signs of a generalized, whole body infection. Occurs within days of a localized infection, especially of the urinary, GI, respiratory or nervous systems; or after a traumatic injury or invasive procedure. - Call EMS 911 if symptoms have worsened, such as increasing confusion or unusual drowsiness; cold and clammy skin; no urine output; rapid respiration (>30/min.) or slow respiration (<10/min.); struggling to breathe. - Go to the ED immediately for early symptoms of rapid pulse >90/min. or rapid breathing >20/min. at rest; chills; oral temperature >100.4 F (38 C) or <96.8 F (36 C) when associated with conditions noted. ~ 12/07/2011 10:59:51AM Page 1 of 1 CAN_TriageRpt_V2

## 2011-12-07 NOTE — Progress Notes (Signed)
Seen Saturday, treated at UTI, on cipro now.  Still with constant back pain on R side and intermittent R sided abd pain.  The back pain isn't much better compared to the weekend.  The pressure with urination is better.  Pain now is 5/10.  No vomiting, diarrhea.  No known h/o renal stones.  Pain is constant, doesn't radiate into the groin.    Meds, vitals, and allergies reviewed.   ROS: See HPI.  Otherwise, noncontributory.  nad ncat Mmm rrr ctab abd soft, not ttp, no suprapubic pain, no rebound R paraspinal muscles ttp but no CVA pain

## 2011-12-08 ENCOUNTER — Other Ambulatory Visit: Payer: Self-pay | Admitting: *Deleted

## 2011-12-08 DIAGNOSIS — M549 Dorsalgia, unspecified: Secondary | ICD-10-CM | POA: Insufficient documentation

## 2011-12-08 MED ORDER — CLONAZEPAM 1 MG PO TABS: 1.0000 mg | ORAL_TABLET | Freq: Every day | ORAL | Status: DC

## 2011-12-08 NOTE — Telephone Encounter (Signed)
Spoke with patient and she was advised that she needs to make a follow up with dr. Patsy Lager. Patient was a little upset that she has to come in for another office visit. She said she would call back and make appt.

## 2011-12-08 NOTE — Telephone Encounter (Signed)
Set up f/u office visit with me at her convenience.

## 2011-12-08 NOTE — Assessment & Plan Note (Addendum)
Likely MSK source.  Predates other sx and bladder sx resolved.  Very much doubt pyelo. Likely with UTI resolved.  Would stretch back and f/u prn.  Local heat and use tizanidine for muscle pain.

## 2011-12-08 NOTE — Telephone Encounter (Signed)
rx called to pharmacy 

## 2011-12-08 NOTE — Telephone Encounter (Signed)
Spoke with patient and she would like to be tested for Celiac disease.  She stated that it is no urgency but didn't want to wait until May when Dr. Ermalene Searing returns.  Please advise.

## 2011-12-11 ENCOUNTER — Telehealth: Payer: Self-pay | Admitting: Family Medicine

## 2011-12-11 NOTE — Telephone Encounter (Signed)
No bladder pain.  Still with some urgency.  No fevers.   Still with back pain.  Pain with sitting and standing.  No radicular or red flag sx.   Tizanidine helps some with the pain but it makes her drowsy.   Will have her rest her back over the weekend, use the muscle relaxer and notify us if not better next week.  She may need PT and this was discussed.

## 2011-12-11 NOTE — Telephone Encounter (Signed)
Patient was seen this week for UTI and back pain.  Patient was told to call if she wasn't feeling better.  She said she can't take the medication for her back at school because it makes her sleepy.  Patient's back isn't feeling any better and it even hurts to sit.  Patient said it feels worse when she uses her treadmill.  Patient can be called back at work or cell [phone].

## 2012-01-11 ENCOUNTER — Other Ambulatory Visit: Payer: Self-pay | Admitting: *Deleted

## 2012-01-11 MED ORDER — CLONAZEPAM 1 MG PO TABS: 1.0000 mg | ORAL_TABLET | Freq: Every day | ORAL | Status: DC

## 2012-01-11 NOTE — Telephone Encounter (Signed)
Last filled 12-08-2011 Last seen for acute 12-07-2011

## 2012-01-11 NOTE — Telephone Encounter (Signed)
rx called to pharmacy 

## 2012-01-28 ENCOUNTER — Other Ambulatory Visit: Payer: Self-pay | Admitting: *Deleted

## 2012-01-28 MED ORDER — SIMVASTATIN 20 MG PO TABS: 20.0000 mg | ORAL_TABLET | Freq: Every day | ORAL | Status: DC

## 2012-02-08 ENCOUNTER — Encounter: Payer: Self-pay | Admitting: Family Medicine

## 2012-02-08 ENCOUNTER — Ambulatory Visit (INDEPENDENT_AMBULATORY_CARE_PROVIDER_SITE_OTHER): Payer: BC Managed Care – PPO | Admitting: Family Medicine

## 2012-02-08 ENCOUNTER — Telehealth: Payer: Self-pay | Admitting: Family Medicine

## 2012-02-08 VITALS — BP 102/70 | HR 76 | Temp 97.9°F | Ht 62.0 in | Wt 229.8 lb

## 2012-02-08 DIAGNOSIS — G43009 Migraine without aura, not intractable, without status migrainosus: Secondary | ICD-10-CM

## 2012-02-08 MED ORDER — KETOROLAC TROMETHAMINE 60 MG/2ML IM SOLN
30.0000 mg | Freq: Once | INTRAMUSCULAR | Status: AC
  Administered 2012-02-08: 30 mg via INTRAMUSCULAR

## 2012-02-08 MED ORDER — PROMETHAZINE HCL 50 MG/ML IJ SOLN
50.0000 mg | Freq: Once | INTRAMUSCULAR | Status: AC
  Administered 2012-02-08: 50 mg via INTRAMUSCULAR

## 2012-02-08 NOTE — Assessment & Plan Note (Addendum)
Exacerbation for 2 wk - poss due to weather change and pollen and stress Cannot seem to break cycle Reassuring exam  Also generic maxalt (? Work as well ) toradol 30 and phenergan 50 today  Then update  Disc lifestyle habits May need to f/u with Dr Sharene Skeans (now in [facility]) if not imp

## 2012-02-08 NOTE — Patient Instructions (Signed)
Shot of toradol (nsaid) today Also shot of phenergan for nausea Go home/ rest and push fluids Hopefully we will break this cycle and weather will clear Update if not improving

## 2012-02-08 NOTE — Telephone Encounter (Signed)
Noted  

## 2012-02-08 NOTE — Telephone Encounter (Signed)
Caller: Selena [name]/Patient; PCP: Kerby Nora E.; CB#: [phone];  Call regarding Migraine; States sx not relieved by Maxalt Rx 02/05/12.  Currently rates HA at 8 of 10 w/ nausea, light sensitivity.  Emergent sx ruled out.  Home care for the interim and parameters for callback given.  Appt at 1200 w/ Dr. Milinda Antis.

## 2012-02-08 NOTE — Progress Notes (Signed)
Subjective:    Patient ID: Selena [name], female    DOB: 03/21/1966, 46 y.o.   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 -- started several hours later, then took her generic maxalt  Would not slow down -- 7-8 pain scale , then vomited  Did not have any zofran  Is improved now , but is very very tired   Today pain is both sides of head - throbbing  Worse with exertion Also photophobia  Also nausea   She does have neck issues and wearing a tens unit  Gets cortisone shots at [facility] - Dr Jon Billings    meds include zofran maxalt mobic-is taking every day for fibromyalgia  effexor xr-- takes every day also  Has soma for her neck spasms   Stress- has a stressful class/ more difficult , this year Feels like she gets enough sleep - 10pm to 5 am  Walking 3-5 miles 4-5 days per week- until this started  Anxious - over her son lately- who suffers from anx and sleep problems  Tries to avoid caffeine - just drinks it when she gets a headache       BP: 102/70 mmHg   Patient Active Problem List  Diagnoses  . PITUITARY ADENOMA, BENIGN  . HYPERLIPIDEMIA  . OBESITY, UNSPECIFIED  . ANEMIA-IRON DEFICIENCY  . DEPRESSION/ANXIETY  . COMMON MIGRAINE  . HYPERTENSION  . ASTHMA, EXERCISE INDUCED  . PREMENSTRUAL DYSPHORIC SYNDROME  . ACNE VULGARIS  . KNEE PAIN, BILATERAL  . FIBROMYALGIA  . CARDIAC MURMUR  . PREDIABETES  . HEAT INTOLERANCE  . PARESTHESIA, HANDS  . DYSPHAGIA PHARYNGEAL PHASE  . Allergic rhinitis  . RUQ abdominal pain  . Fatty liver  . Cellulitis  . Polyuria  . Back pain   Past Medical History  Diagnosis Date  . Anemia, iron deficiency   . Depression     . Hyperlipidemia   . Hypertension   . Anxiety   . Gallstones   . Fibromyalgia    Past Surgical History  Procedure Date  . 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 Gallbladder surgery 2001  . Tonsillectomy 1989   History  Substance Use Topics  . Smoking status: Never Smoker   . Smokeless tobacco: Not on file  . Alcohol Use: Yes     4 Times a month   Family History  Problem Relation Age of Onset  . Hyperlipidemia Mother   . Heart disease Mother     Atrial fibrilation  . Cancer Mother     ? Melanoma  . Diabetes Father   . Mental illness Father     Bipolar  . Cancer Sister     Clear cell sarcoma in leg  . Hypothyroidism Sister   . Hypertension Maternal Grandmother   . Heart disease Maternal Grandmother     Afib  . Arthritis Paternal Grandmother    Allergies  Allergen Reactions  . Sulfonamide Derivatives     REACTION: Itchy  . Topiramate     REACTION: bad reaction. put in hospital   Current Outpatient Prescriptions on File Prior  to Visit  Medication Sig Dispense Refill  . cetirizine (ZYRTEC) 10 MG tablet Take 10 mg by mouth daily.        Marland Kitchen esomeprazole (NEXIUM) 40 MG packet Take 40 mg by mouth daily before breakfast. As needed.       . meloxicam (MOBIC) 15 MG tablet TAKE 1 TABLET BY MOUTH ONCE A DAY  30 tablet  3  . omeprazole (PRILOSEC OTC) 20 MG tablet Take 20 mg by mouth daily as needed.        . ondansetron (ZOFRAN) 4 MG tablet Take 1 tablet (4 mg total) by mouth every 8 (eight) hours as needed for nausea.  20 tablet  1  . simvastatin (ZOCOR) 20 MG tablet Take 1 tablet (20 mg total) by mouth daily.  30 tablet  11  . venlafaxine (EFFEXOR-XR) 150 MG 24 hr capsule TAKE 1 CAPSULE BY MOUTH ONCE A DAY  30 capsule  5  . clonazePAM (KLONOPIN) 1 MG tablet Take 1 tablet (1 mg total) by mouth at bedtime.  30 tablet  0  . rizatriptan (MAXALT) 10 MG tablet TAKE 1 TABLET BY MOUTH AS NEEDED FOR MIGRAINE. MAY REPEAT IN 2 HOURS IF NEEDED  10 tablet  1  . DISCONTD:  esomeprazole (NEXIUM) 40 MG packet Take 40 mg by mouth daily before breakfast.  30 each  12    Review of Systems Review of Systems  Constitutional: Negative for fever, appetite change, and unexpected weight change.  Eyes: Negative for pain and visual disturbance.  Respiratory: Negative for cough and shortness of breath.   Cardiovascular: Negative for cp or palpitations    Gastrointestinal: Negative for nausea, diarrhea and constipation.  Genitourinary: Negative for urgency and frequency.  Skin: Negative for pallor or rash   Neurological: Negative for weakness, light-headedness, numbness and pos for headache Hematological: Negative for adenopathy. Does not bruise/bleed easily.  Psychiatric/Behavioral: Negative for dysphoric mood. The patient is not nervous/anxious.  pos for moderate to severe stress        Objective:   Physical Exam  Constitutional: She appears well-developed and well-nourished. No distress.  HENT:  Head: Normocephalic and atraumatic.  Right Ear: External ear normal.  Left Ear: External ear normal.  Nose: Nose normal.  Mouth/Throat: Oropharynx is clear and moist. No oropharyngeal exudate.       Nares are boggy No sinus or temple tenderness   Eyes: Conjunctivae and EOM are normal. Pupils are equal, round, and reactive to light. No scleral icterus.  Neck: Normal range of motion. Neck supple. No JVD present. Carotid bruit is not present. Erythema present. No thyromegaly present.  Cardiovascular: Normal rate, regular rhythm, normal heart sounds and intact distal pulses.  Exam reveals no gallop.   Pulmonary/Chest: Effort normal and breath sounds normal. No respiratory distress. She has no wheezes.  Abdominal: Soft. Bowel sounds are normal. She exhibits no distension. There is no tenderness.  Musculoskeletal: She exhibits no edema and no tenderness.  Lymphadenopathy:    She has no cervical adenopathy.  Neurological: She is alert. She has normal reflexes. She is not  disoriented. She displays no atrophy and no tremor. No cranial nerve deficit or sensory deficit. She exhibits normal muscle tone. Coordination and gait normal.       No focal cerebellar signs  Skin: Skin is warm and dry. No rash noted. No erythema. No pallor.  Psychiatric: She has a normal mood and affect.          Assessment & Plan:

## 2012-02-12 ENCOUNTER — Other Ambulatory Visit: Payer: Self-pay | Admitting: *Deleted

## 2012-02-12 ENCOUNTER — Other Ambulatory Visit: Payer: Self-pay | Admitting: Family Medicine

## 2012-02-12 MED ORDER — CLONAZEPAM 1 MG PO TABS: 1.0000 mg | ORAL_TABLET | Freq: Every day | ORAL | Status: DC

## 2012-02-12 NOTE — Telephone Encounter (Signed)
RX CALLED TO PHARMACY  

## 2012-03-16 ENCOUNTER — Ambulatory Visit (INDEPENDENT_AMBULATORY_CARE_PROVIDER_SITE_OTHER): Payer: BC Managed Care – PPO | Admitting: Family Medicine

## 2012-03-16 ENCOUNTER — Encounter: Payer: Self-pay | Admitting: Family Medicine

## 2012-03-16 VITALS — Ht 62.0 in

## 2012-03-16 DIAGNOSIS — Z0289 Encounter for other administrative examinations: Secondary | ICD-10-CM

## 2012-03-16 DIAGNOSIS — M7150 Other bursitis, not elsewhere classified, unspecified site: Secondary | ICD-10-CM

## 2012-03-17 NOTE — Progress Notes (Signed)
Did not keep appt

## 2012-03-22 ENCOUNTER — Other Ambulatory Visit: Payer: Self-pay | Admitting: *Deleted

## 2012-03-22 MED ORDER — CLONAZEPAM 1 MG PO TABS: 1.0000 mg | ORAL_TABLET | Freq: Every day | ORAL | Status: DC

## 2012-03-22 NOTE — Telephone Encounter (Signed)
Faxed refill request from [facility]  [address], last filled date not given.

## 2012-03-22 NOTE — Telephone Encounter (Signed)
Called to [facility] 

## 2012-03-22 NOTE — Telephone Encounter (Signed)
Last refill 5/10, last appt within last few months.

## 2012-03-28 ENCOUNTER — Telehealth: Payer: Self-pay | Admitting: Gastroenterology

## 2012-03-29 ENCOUNTER — Ambulatory Visit (INDEPENDENT_AMBULATORY_CARE_PROVIDER_SITE_OTHER): Payer: BC Managed Care – PPO | Admitting: Gastroenterology

## 2012-03-29 ENCOUNTER — Encounter: Payer: Self-pay | Admitting: Gastroenterology

## 2012-03-29 VITALS — BP 122/84 | HR 106 | Ht 62.0 in | Wt 226.0 lb

## 2012-03-29 DIAGNOSIS — K3189 Other diseases of stomach and duodenum: Secondary | ICD-10-CM

## 2012-03-29 DIAGNOSIS — R1013 Epigastric pain: Secondary | ICD-10-CM

## 2012-03-29 DIAGNOSIS — R109 Unspecified abdominal pain: Secondary | ICD-10-CM

## 2012-03-29 DIAGNOSIS — K219 Gastro-esophageal reflux disease without esophagitis: Secondary | ICD-10-CM

## 2012-03-29 DIAGNOSIS — R1310 Dysphagia, unspecified: Secondary | ICD-10-CM | POA: Insufficient documentation

## 2012-03-29 NOTE — Progress Notes (Signed)
History of Present Illness: Ms. Piedra has returned for evaluation of dysphagia and abdominal fullness. In October 2011 she has similar symptoms for which she underwent upper endoscopy with dilatation of a distal esophageal stricture. Symptoms were improved following dilatation. She now has mild dysphagia to solids. Her other complaint is postprandial fullness, especially in her chest. This occurs especially after drinking carbonated beverages. She vomits at least once a week.  She takes Mobic daily for fibromyalgia. She denies pyrosis.    Past Medical History  Diagnosis Date  . Anemia, iron deficiency   . Depression   . Hyperlipidemia   . Hypertension   . Anxiety   . Gallstones   . Fibromyalgia    Past Surgical History  Procedure Date  . 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 Tonsillectomy 1989  . Cholecystectomy    family history includes Arthritis in her paternal grandmother; Cancer in her mother and sister; Diabetes in her father; Heart disease in her maternal grandmother and mother; Hyperlipidemia in her mother; Hypertension in her maternal grandmother; Hypothyroidism in her sister; and Mental illness in her father. Current Outpatient Prescriptions  Medication Sig Dispense Refill  . cetirizine (ZYRTEC) 10 MG tablet Take 10 mg by mouth daily.        . clonazePAM (KLONOPIN) 1 MG tablet Take 1 tablet (1 mg total) by mouth at bedtime.  30 tablet  0  . esomeprazole (NEXIUM) 40 MG packet Take 40 mg by mouth daily before breakfast. As needed.       . meloxicam (MOBIC) 15 MG tablet TAKE 1 TABLET BY MOUTH ONCE A DAY  30 tablet  3  . omeprazole (PRILOSEC OTC) 20 MG tablet Take 20 mg by mouth daily as needed.        . ondansetron (ZOFRAN) 4 MG tablet Take 1 tablet (4 mg total) by mouth every 8 (eight) hours as needed for nausea.  20 tablet  1  . simvastatin (ZOCOR) 20 MG tablet Take 1 tablet (20 mg total) by mouth daily.  30 tablet  11  . venlafaxine (EFFEXOR-XR) 150 MG 24 hr  capsule TAKE 1 CAPSULE BY MOUTH ONCE A DAY  30 capsule  5  . zonisamide (ZONEGRAN) 25 MG capsule       . rizatriptan (MAXALT) 10 MG tablet TAKE 1 TABLET BY MOUTH AS NEEDED FOR MIGRAINE. MAY REPEAT IN 2 HOURS IF NEEDED  10 tablet  1  . DISCONTD: esomeprazole (NEXIUM) 40 MG packet Take 40 mg by mouth daily before breakfast.  30 each  12   Allergies as of 03/29/2012 - Review Complete 03/29/2012  Allergen Reaction Noted  . Sulfonamide derivatives    . Topiramate  09/26/2008    reports that she has never smoked. She has never used smokeless tobacco. She reports that she drinks alcohol. She reports that she does not use illicit drugs.     Review of Systems: She has frequent muscle pain Pertinent positive and negative review of systems were noted in the above HPI section. All other review of systems were otherwise negative.  Vital signs were reviewed in today's medical record Physical Exam: General: Well developed , well nourished, no acute distress Head: Normocephalic and atraumatic Eyes:  sclerae anicteric, EOMI Ears: Normal auditory acuity Mouth: No deformity or lesions Neck: Supple, no masses or thyromegaly Lungs: Clear throughout to auscultation Heart: Regular rate and rhythm; no murmurs, rubs or bruits Abdomen: There is no succussion splash .Soft, non tender and non distended. No  masses, hepatosplenomegaly or hernias noted. Normal Bowel sounds Rectal:deferred Musculoskeletal: Symmetrical with no gross deformities  Skin: No lesions on visible extremities Pulses:  Normal pulses noted Extremities: No clubbing, cyanosis, edema or deformities noted Neurological: Alert oriented x 4, grossly nonfocal Cervical Nodes:  No significant cervical adenopathy Inguinal Nodes: No significant inguinal adenopathy Psychological:  Alert and cooperative. Normal mood and affect

## 2012-03-29 NOTE — Assessment & Plan Note (Addendum)
She seems to have dysphagia to solids reminiscent of symptoms from an esophageal stricture.  Recommendations #1 endoscopy with dilatation as indicated

## 2012-03-29 NOTE — Patient Instructions (Addendum)
Your Gastric Emptying Scan is scheduled on 04/06/2012 at 10am at [facility]  No Stomach medications 24 hours before your test Nothing to eat or drink after midniight Your Endoscopy is scheduled on 05/03/2012 at 8:30am Separate instructions have been given

## 2012-03-29 NOTE — Assessment & Plan Note (Addendum)
Dyspepsia may be due to gastroparesis.  Mobic may be a contributing factor.  Active PUD should be ruled out  Recommendations #1 hold Mobic for 2-3 days #2 continue Prilosec #3 gastric emptying scan 4) EGD

## 2012-03-29 NOTE — Telephone Encounter (Signed)
Pt states she feels a "lump" in her throat. States that she cannot eat or drink anything carbonated and that at least once a week she throws up. States she has lost some weight. Pt requesting to be seen. Pt scheduled to see Dr. Arlyce Dice today at 10:30am. Pt aware of appt date and time.

## 2012-03-30 ENCOUNTER — Encounter: Payer: Self-pay | Admitting: Gastroenterology

## 2012-04-06 ENCOUNTER — Telehealth: Payer: Self-pay | Admitting: Gastroenterology

## 2012-04-06 ENCOUNTER — Encounter (HOSPITAL_COMMUNITY)
Admission: RE | Admit: 2012-04-06 | Discharge: 2012-04-06 | Disposition: A | Discharge status: Home / Self Care | Payer: BC Managed Care – PPO | Source: Ambulatory Visit | Attending: Gastroenterology | Admitting: Gastroenterology

## 2012-04-06 DIAGNOSIS — K219 Gastro-esophageal reflux disease without esophagitis: Secondary | ICD-10-CM

## 2012-04-06 DIAGNOSIS — R109 Unspecified abdominal pain: Secondary | ICD-10-CM | POA: Insufficient documentation

## 2012-04-06 DIAGNOSIS — R110 Nausea: Secondary | ICD-10-CM | POA: Insufficient documentation

## 2012-04-06 MED ORDER — TECHNETIUM TC 99M SULFUR COLLOID
2.2000 | Freq: Once | INTRAVENOUS | Status: AC
  Administered 2012-04-06: 2.2 via INTRAVENOUS

## 2012-04-06 NOTE — Telephone Encounter (Signed)
Pt states she has been undergoing testing from Dr. Arlyce Dice and wanted to know if she can get tested for Celiac, she has been tracking a noticed a few things and thinks she may have celiac disease.

## 2012-04-06 NOTE — Telephone Encounter (Signed)
Pt would like to be tested for celiac disease. She has noticed that she feels bad after eating certain foods. Proteins and veggies she seems to do ok with. Please advise.

## 2012-04-11 ENCOUNTER — Other Ambulatory Visit: Payer: BC Managed Care – PPO

## 2012-04-11 DIAGNOSIS — R109 Unspecified abdominal pain: Secondary | ICD-10-CM

## 2012-04-11 NOTE — Telephone Encounter (Signed)
Okay. Please order a celiac panel

## 2012-04-11 NOTE — Telephone Encounter (Signed)
Pt will come for labs today, orders entered.

## 2012-04-12 LAB — TISSUE TRANSGLUTAMINASE, IGA: Tissue Transglutaminase Ab, IgA: 3 U/mL (ref <20)

## 2012-04-12 LAB — RETICULIN ANTIBODIES, IGA W TITER: Reticulin Ab, IgA: NEGATIVE

## 2012-04-12 LAB — GLIADIN ANTIBODIES, SERUM
Gliadin IgA: 2.6 U/mL (ref <20)
Gliadin IgG: 4.4 U/mL (ref <20)

## 2012-04-14 ENCOUNTER — Telehealth: Payer: Self-pay | Admitting: Gastroenterology

## 2012-04-14 NOTE — Telephone Encounter (Signed)
Pt was tested for celiac disease and had a ges done. Both were normal. Pt has EGD scheduled but wants to know if there is any other test that she needs to find out why she is having nausea and diarrhea. Please advise.

## 2012-04-15 NOTE — Telephone Encounter (Signed)
Proceed wit EGD. Has she held mobic for several days?  If not she should and then reevaluate for symptoms.

## 2012-04-15 NOTE — Telephone Encounter (Signed)
Left message for pt to call back  °

## 2012-04-15 NOTE — Telephone Encounter (Signed)
Spoke with pt and she is aware.

## 2012-04-19 ENCOUNTER — Encounter: Payer: Self-pay | Admitting: Gastroenterology

## 2012-04-19 ENCOUNTER — Ambulatory Visit (AMBULATORY_SURGERY_CENTER): Payer: BC Managed Care – PPO | Admitting: Gastroenterology

## 2012-04-19 VITALS — BP 133/105 | HR 84 | Temp 98.6°F | Resp 15 | Ht 62.0 in | Wt 226.0 lb

## 2012-04-19 DIAGNOSIS — R1310 Dysphagia, unspecified: Secondary | ICD-10-CM

## 2012-04-19 DIAGNOSIS — R109 Unspecified abdominal pain: Secondary | ICD-10-CM

## 2012-04-19 DIAGNOSIS — K3189 Other diseases of stomach and duodenum: Secondary | ICD-10-CM

## 2012-04-19 DIAGNOSIS — K222 Esophageal obstruction: Secondary | ICD-10-CM

## 2012-04-19 DIAGNOSIS — R1013 Epigastric pain: Secondary | ICD-10-CM

## 2012-04-19 DIAGNOSIS — K219 Gastro-esophageal reflux disease without esophagitis: Secondary | ICD-10-CM

## 2012-04-19 MED ORDER — SODIUM CHLORIDE 0.9 % IV SOLN: 500.0000 mL | INTRAVENOUS | Status: DC

## 2012-04-19 MED ORDER — HYOSCYAMINE SULFATE 0.125 MG SL SUBL: 0.2500 mg | SUBLINGUAL_TABLET | SUBLINGUAL | Status: DC | PRN

## 2012-04-19 NOTE — Patient Instructions (Signed)
YOU HAD AN ENDOSCOPIC PROCEDURE TODAY AT THE [facility]: Refer to the procedure report that was given to you for any specific questions about what was found during the examination.  If the procedure report does not answer your questions, please call your gastroenterologist to clarify.  If you requested that your care partner not be given the details of your procedure findings, then the procedure report has been included in a sealed envelope for you to review at your convenience later.  YOU SHOULD EXPECT: Some feelings of bloating in the abdomen. Passage of more gas than usual.  Walking can help get rid of the air that was put into your GI tract during the procedure and reduce the bloating. If you had a lower endoscopy (such as a colonoscopy or flexible sigmoidoscopy) you may notice spotting of blood in your stool or on the toilet paper. If you underwent a bowel prep for your procedure, then you may not have a normal bowel movement for a few days.  DIET:FOLLOW DILATATION DIET GIVEN TO YOU TODAY avoid alcoholic beverages for 24 hours.  ACTIVITY: Your care partner should take you home directly after the procedure.  You should plan to take it easy, moving slowly for the rest of the day.  You can resume normal activity the day after the procedure however you should NOT DRIVE or use heavy machinery for 24 hours (because of the sedation medicines used during the test).    SYMPTOMS TO REPORT IMMEDIATELY: A gastroenterologist can be reached at any hour.  During normal business hours, 8:30 AM to 5:00 PM Monday through Friday, call [phone].  After hours and on weekends, please call the GI answering service at [phone] who will take a message and have the physician on call contact you.     Following upper endoscopy (EGD)  Vomiting of blood or coffee ground material  New chest pain or pain under the shoulder blades  Painful or persistently difficult swallowing  New shortness of  breath  Fever of 100F or higher  Black, tarry-looking stools  FOLLOW UP: If any biopsies were taken you will be contacted by phone or by letter within the next 1-3 weeks.  Call your gastroenterologist if you have not heard about the biopsies in 3 weeks.  Our staff will call the home number listed on your records the next business day following your procedure to check on you and address any questions or concerns that you may have at that time regarding the information given to you following your procedure. This is a courtesy call and so if there is no answer at the home number and we have not heard from you through the emergency physician on call, we will assume that you have returned to your regular daily activities without incident.  SIGNATURES/CONFIDENTIALITY: You and/or your care partner have signed paperwork which will be entered into your electronic medical record.  These signatures attest to the fact that that the information above on your After Visit Summary has been reviewed and is understood.  Full responsibility of the confidentiality of this discharge information lies with you and/or your care-partner.    AVOID NSAIDS  MAKE APPOINTMENT WITH DR Arlyce Dice FOR ONE MONTH

## 2012-04-19 NOTE — Op Note (Signed)
[facility] [address] [name], [place]  [address]  ENDOSCOPY PROCEDURE REPORT  PATIENT:  Selena [name], Selena [name]  MR#:  [id number] BIRTHDATE:  [birth year], 46 yrs. old  GENDER:  female  ENDOSCOPIST:  Barbette Hair. Arlyce Dice, MD Referred by:  Excell Seltzer, M.D.  PROCEDURE DATE:  04/19/2012 PROCEDURE:  EGD, diagnostic 43235, Maloney Dilation of Esophagus ASA CLASS:  Class II INDICATIONS:  dyspepsia, dysphagia  MEDICATIONS:   MAC sedation, administered by CRNA propofol 200mg IV, glycopyrrolate (Robinal) 0.2 mg IV, 0.6cc simethancone 0.6 cc PO TOPICAL ANESTHETIC:  Cetacaine Spray  DESCRIPTION OF PROCEDURE:   After the risks and benefits of the procedure were explained, informed consent was obtained.  The LB [id number] [id number] endoscope was introduced through the mouth and advanced to the third portion of the duodenum.  The instrument was slowly withdrawn as the mucosa was fully examined. <<PROCEDUREIMAGES>>  A stricture was found at the gastroesophageal junction (see image1). Early esophageal stricture 18mm Minimal resistance; noheme  Otherwise the examination was normal (see image2 and image3).    Retroflexed views revealed no abnormalities.    The scope was then withdrawn from the patient and the procedure completed.  COMPLICATIONS:  None  ENDOSCOPIC IMPRESSION: 1) Stricture at the gastroesophageal junction - s/p maloney dilitation 2) Otherwise normal examination RECOMMENDATIONS: 1) Avoid NSAIDS 2) Call office next 2-3 days to schedule an office appointment for 1 month  ______________________________ Barbette Hair. Arlyce Dice, MD  CC:  n. eSIGNED:   Barbette Hair. Koltin Wehmeyer at 04/19/2012 08:50 AM  [name], [phone]

## 2012-04-19 NOTE — Progress Notes (Signed)
Patient did not have preoperative order for IV antibiotic SSI prophylaxis. (G8918)  Patient did not experience any of the following events: a burn prior to discharge; a fall within the facility; wrong site/side/patient/procedure/implant event; or a hospital transfer or hospital admission upon discharge from the facility. (G8907)  

## 2012-04-20 ENCOUNTER — Telehealth: Payer: Self-pay

## 2012-04-20 NOTE — Telephone Encounter (Signed)
  Follow up Call-  Call back number 04/19/2012  Post procedure Call Back phone  # [phone]  Permission to leave phone message Yes     Patient questions:  Do you have a fever, pain , or abdominal swelling? no Pain Score  0 *  Have you tolerated food without any problems? yes  Have you been able to return to your normal activities? yes  Do you have any questions about your discharge instructions: Diet   no Medications  no Follow up visit  no  Do you have questions or concerns about your Care? no  Actions: * If pain score is 4 or above: No action needed, pain <4.

## 2012-04-22 ENCOUNTER — Other Ambulatory Visit: Payer: Self-pay | Admitting: *Deleted

## 2012-04-22 MED ORDER — CLONAZEPAM 1 MG PO TABS: 1.0000 mg | ORAL_TABLET | Freq: Every day | ORAL | Status: DC

## 2012-04-22 NOTE — Telephone Encounter (Signed)
rx called to pharmacy 

## 2012-05-03 ENCOUNTER — Encounter: Payer: BC Managed Care – PPO | Admitting: Gastroenterology

## 2012-05-04 ENCOUNTER — Telehealth: Payer: Self-pay | Admitting: Gastroenterology

## 2012-05-04 NOTE — Telephone Encounter (Signed)
Left message for pt to call back  °

## 2012-05-05 NOTE — Telephone Encounter (Signed)
Spoke with pt and she states that her throat has been sore in the mornings after her procedure but it goes away in the evening. Discussed with the pt that she may have some allergy/drainage since it does not hurt in the afternoon. Pt verbalized understanding and will try some of her allergy medication.

## 2012-05-07 ENCOUNTER — Other Ambulatory Visit: Payer: Self-pay | Admitting: Family Medicine

## 2012-05-24 ENCOUNTER — Other Ambulatory Visit: Payer: Self-pay | Admitting: *Deleted

## 2012-05-24 MED ORDER — CLONAZEPAM 1 MG PO TABS: 1.0000 mg | ORAL_TABLET | Freq: Every day | ORAL | Status: DC

## 2012-05-24 NOTE — Telephone Encounter (Signed)
rx called to pharmacy 

## 2012-05-24 NOTE — Telephone Encounter (Signed)
Last refill: 04/22/2012

## 2012-05-30 ENCOUNTER — Encounter: Payer: Self-pay | Admitting: Gastroenterology

## 2012-05-30 ENCOUNTER — Ambulatory Visit (INDEPENDENT_AMBULATORY_CARE_PROVIDER_SITE_OTHER): Payer: BC Managed Care – PPO | Admitting: Gastroenterology

## 2012-05-30 VITALS — BP 112/80 | HR 96 | Ht 62.25 in | Wt 228.1 lb

## 2012-05-30 DIAGNOSIS — R197 Diarrhea, unspecified: Secondary | ICD-10-CM

## 2012-05-30 DIAGNOSIS — R1310 Dysphagia, unspecified: Secondary | ICD-10-CM

## 2012-05-30 MED ORDER — NA SULFATE-K SULFATE-MG SULF 17.5-3.13-1.6 GM/177ML PO SOLN: 2.0000 | Freq: Once | ORAL | Status: DC

## 2012-05-30 NOTE — Progress Notes (Signed)
History of Present Illness:  Patient has returned following esophageal dilatation of a distal stricture. Dysphagia has entirely resolved. Her current complaint is diarrhea. Over the past 6 months she's had almost daily immediate postprandial diarrhea, accompanied by urgency, pain and diaphoresis. This occurs at least once a day. It is not specific any particular foods. Prior to 6 months ago she may have loose stools when anxious. She may see minimal amounts of blood on the toilet tissue which she has attributed to fissures. There is been no change in diet or medications.    Review of Systems: Pertinent positive and negative review of systems were noted in the above HPI section. All other review of systems were otherwise negative.    Current Medications, Allergies, Past Medical History, Past Surgical History, Family History and Social History were reviewed in [facility] electronic medical record  Vital signs were reviewed in today's medical record. Physical Exam: General: Well developed , well nourished, no acute distress

## 2012-05-30 NOTE — Assessment & Plan Note (Addendum)
Symptoms are suggestive of IBS. Structural abnormalities of the colon including microscopic colitis and inflammatory bowel disease should be ruled out.  Plan colonoscopy

## 2012-05-30 NOTE — Patient Instructions (Addendum)
Colonoscopy A colonoscopy is an exam to evaluate your entire colon. In this exam, your colon is cleansed. A long fiberoptic tube is inserted through your rectum and into your colon. The fiberoptic scope (endoscope) is a long bundle of enclosed and very flexible fibers. These fibers transmit light to the area examined and send images from that area to your caregiver. Discomfort is usually minimal. You may be given a drug to help you sleep (sedative) during or prior to the procedure. This exam helps to detect lumps (tumors), polyps, inflammation, and areas of bleeding. Your caregiver may also take a small piece of tissue (biopsy) that will be examined under a microscope. LET YOUR CAREGIVER KNOW ABOUT:   Allergies to food or medicine.   Medicines taken, including vitamins, herbs, eyedrops, over-the-counter medicines, and creams.   Use of steroids (by mouth or creams).   Previous problems with anesthetics or numbing medicines.   History of bleeding problems or blood clots.   Previous surgery.   Other health problems, including diabetes and kidney problems.   Possibility of pregnancy, if this applies.  BEFORE THE PROCEDURE   A clear liquid diet may be required for 2 days before the exam.   Ask your caregiver about changing or stopping your regular medications.   Liquid injections (enemas) or laxatives may be required.   A large amount of electrolyte solution may be given to you to drink over a short period of time. This solution is used to clean out your colon.   You should be present 60 minutes prior to your procedure or as directed by your caregiver.  AFTER THE PROCEDURE   If you received a sedative or pain relieving medication, you will need to arrange for someone to drive you home.   Occasionally, there is a little blood passed with the first bowel movement. Do not be concerned.  FINDING OUT THE RESULTS OF YOUR TEST Not all test results are available during your visit. If your test  results are not back during the visit, make an appointment with your caregiver to find out the results. Do not assume everything is normal if you have not heard from your caregiver or the medical facility. It is important for you to follow up on all of your test results. HOME CARE INSTRUCTIONS   It is not unusual to pass moderate amounts of gas and experience mild abdominal cramping following the procedure. This is due to air being used to inflate your colon during the exam. Walking or a warm pack on your belly (abdomen) may help.   You may resume all normal meals and activities after sedatives and medicines have worn off.   Only take over-the-counter or prescription medicines for pain, discomfort, or fever as directed by your caregiver. Do not use aspirin or blood thinners if a biopsy was taken. Consult your caregiver for medicine usage if biopsies were taken.  SEEK IMMEDIATE MEDICAL CARE IF:   You have a fever.   You pass large blood clots or fill a toilet with blood following the procedure. This may also occur 10 to 14 days following the procedure. This is more likely if a biopsy was taken.   You develop abdominal pain that keeps getting worse and cannot be relieved with medicine.  Document Released: 09/18/2000 Document Revised: 09/10/2011 Document Reviewed: 05/03/2008 [employer] Patient Information 2012 [employer]. 

## 2012-05-30 NOTE — Assessment & Plan Note (Signed)
Resolved following dilatation of a distal esophageal stricture 

## 2012-06-17 ENCOUNTER — Telehealth: Payer: Self-pay | Admitting: Family Medicine

## 2012-06-17 ENCOUNTER — Ambulatory Visit (INDEPENDENT_AMBULATORY_CARE_PROVIDER_SITE_OTHER): Payer: BC Managed Care – PPO | Admitting: Internal Medicine

## 2012-06-17 ENCOUNTER — Encounter: Payer: Self-pay | Admitting: Internal Medicine

## 2012-06-17 VITALS — BP 124/80 | HR 96 | Temp 98.3°F | Wt 229.5 lb

## 2012-06-17 DIAGNOSIS — F341 Dysthymic disorder: Secondary | ICD-10-CM

## 2012-06-17 DIAGNOSIS — R609 Edema, unspecified: Secondary | ICD-10-CM

## 2012-06-17 LAB — BASIC METABOLIC PANEL
BUN: 11 mg/dL (ref 6–23)
CO2: 27 mEq/L (ref 19–32)
Calcium: 9.9 mg/dL (ref 8.4–10.5)
Chloride: 105 mEq/L (ref 96–112)
Creat: 0.96 mg/dL (ref 0.50–1.10)
Glucose, Bld: 91 mg/dL (ref 70–99)
Potassium: 3.6 mEq/L (ref 3.5–5.3)
Sodium: 140 mEq/L (ref 135–145)

## 2012-06-17 LAB — HEPATIC FUNCTION PANEL
ALT: 25 U/L (ref 0–35)
AST: 23 U/L (ref 0–37)
Albumin: 4.5 g/dL (ref 3.5–5.2)
Alkaline Phosphatase: 54 U/L (ref 39–117)
Bilirubin, Direct: 0.1 mg/dL (ref 0.0–0.3)
Indirect Bilirubin: 0.3 mg/dL (ref 0.0–0.9)
Total Bilirubin: 0.4 mg/dL (ref 0.3–1.2)
Total Protein: 6.8 g/dL (ref 6.0–8.3)

## 2012-06-17 LAB — CBC WITH DIFFERENTIAL/PLATELET
Basophils Absolute: 0.1 10*3/uL (ref 0.0–0.1)
Basophils Relative: 1 % (ref 0–1)
Eosinophils Absolute: 0.2 10*3/uL (ref 0.0–0.7)
Eosinophils Relative: 2 % (ref 0–5)
HCT: 41.1 % (ref 36.0–46.0)
Hemoglobin: 13.8 g/dL (ref 12.0–15.0)
Lymphocytes Relative: 37 % (ref 12–46)
Lymphs Abs: 3.3 10*3/uL (ref 0.7–4.0)
MCH: 30.8 pg (ref 26.0–34.0)
MCHC: 33.6 g/dL (ref 30.0–36.0)
MCV: 91.7 fL (ref 78.0–100.0)
Monocytes Absolute: 0.6 10*3/uL (ref 0.1–1.0)
Monocytes Relative: 7 % (ref 3–12)
Neutro Abs: 4.7 10*3/uL (ref 1.7–7.7)
Neutrophils Relative %: 53 % (ref 43–77)
Platelets: 353 10*3/uL (ref 150–400)
RBC: 4.48 MIL/uL (ref 3.87–5.11)
RDW: 13.5 % (ref 11.5–15.5)
WBC: 8.9 10*3/uL (ref 4.0–10.5)

## 2012-06-17 NOTE — Progress Notes (Signed)
Subjective:    Patient ID: Selena [name], female    DOB: 03-09-1966, 46 y.o.   MRN: [id number]  HPI Having some increased swelling in her legs--just in the past couple of days Tried increased fluids Noticed that they were swollen even this AM so she called in  Feels fine No SOB Chronic chest pain---no real change or recent problems with this No dizziness or syncope No real change in eating---not sure of any increased salt intake  Recent sinus symptoms---using OTC meds but not since last week  Mild aching in legs today This reminds her of preeclampsia  Current Outpatient Prescriptions on File Prior to Visit  Medication Sig Dispense Refill  . cetirizine (ZYRTEC) 10 MG tablet Take 10 mg by mouth daily.        . clonazePAM (KLONOPIN) 1 MG tablet Take 1 tablet (1 mg total) by mouth at bedtime.  30 tablet  0  . hyoscyamine (LEVSIN SL) 0.125 MG SL tablet Place 2 tablets (0.25 mg total) under the tongue every 4 (four) hours as needed for cramping.  30 tablet  2  . Na Sulfate-K Sulfate-Mg Sulf (SUPREP BOWEL PREP) SOLN Take 2 Bottles by mouth once.  2 Bottle  0  . omeprazole (PRILOSEC OTC) 20 MG tablet Take 20 mg by mouth daily as needed.        . ondansetron (ZOFRAN) 4 MG tablet Take 1 tablet (4 mg total) by mouth every 8 (eight) hours as needed for nausea.  20 tablet  1  . rizatriptan (MAXALT) 10 MG tablet TAKE 1 TABLET BY MOUTH AS NEEDED FOR MIGRAINE. MAY REPEAT IN 2 HOURS IF NEEDED  10 tablet  1  . simvastatin (ZOCOR) 20 MG tablet Take 1 tablet (20 mg total) by mouth daily.  30 tablet  11  . tiZANidine (ZANAFLEX) 4 MG capsule Take 4 mg by mouth as needed.      . venlafaxine XR (EFFEXOR-XR) 150 MG 24 hr capsule TAKE 1 CAPSULE BY MOUTH ONCE A DAY  30 capsule  1  . zonisamide (ZONEGRAN) 25 MG capsule         Allergies  Allergen Reactions  . Sulfonamide Derivatives     REACTION: Itchy  . Topiramate     REACTION: bad reaction. put in hospital/chest pain    Past Medical History    Diagnosis Date  . Anemia, iron deficiency   . Depression   . Hyperlipidemia   . Hypertension   . Anxiety   . Gallstones   . Fibromyalgia     Past Surgical History  Procedure Date  . 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 Tonsillectomy 1989  . Cholecystectomy     Family History  Problem Relation Age of Onset  . Hyperlipidemia Mother   . Heart disease Mother     Atrial fibrilation  . Cancer Mother     ? Melanoma  . Diabetes Father   . Mental illness Father     Bipolar  . Cancer Sister     Clear cell sarcoma in leg  . Hypothyroidism Sister   . Hypertension Maternal Grandmother   . Heart disease Maternal Grandmother     Afib  . Arthritis Paternal Grandmother   . Colon cancer Neg Hx   . Rectal cancer Neg Hx     History   Social History  . Marital Status: Married    Spouse Name: N/A    Number of Children: 1  . Years of Education: N/A  Occupational History  . Teaches preschool    Social History Main Topics  . Smoking status: Never Smoker   . Smokeless tobacco: Never Used  . Alcohol Use: Yes     4 Times a month  . Drug Use: No  . Sexually Active: Not on file   Other Topics Concern  . Not on file   Social History Narrative   2 Step children; asthma, ADHD   Review of Systems Teaches pre-k    Objective:   Physical Exam  Constitutional: She appears well-developed and well-nourished. No distress.  Neck: Normal range of motion. Neck supple. No thyromegaly present.  Cardiovascular: Normal rate, regular rhythm, normal heart sounds and intact distal pulses.  Exam reveals no gallop.   No murmur heard. Pulmonary/Chest: Effort normal and breath sounds normal. No respiratory distress. She has no wheezes. She has no rales.  Abdominal: Soft. There is no tenderness.  Musculoskeletal: She exhibits edema. She exhibits no tenderness.       Thick calves without pitting  Slight edema but calves not tight  Lymphadenopathy:    She has no cervical  adenopathy.  Psychiatric: She has a normal mood and affect. Her behavior is normal.          Assessment & Plan:

## 2012-06-17 NOTE — Telephone Encounter (Signed)
Will eval at OV

## 2012-06-17 NOTE — Telephone Encounter (Signed)
Caller: [place]/Patient; Patient Name: Selena [name]; PCP: [facility]); Best Callback Phone Number: [phone]; Call regarding Bi-lat Feet, Ankle and Leg swelling, onset 9-11. All emergent symptoms ruled out per Edema Protocol, see in 4 hours, due to new edema, unexplained.  Appointment scheduled at 1545 on 9-13 with Dr Alphonsus Sias, no availablity with Dr Ermalene Searing or Copland.  Patient verbalized understanding.

## 2012-06-17 NOTE — Assessment & Plan Note (Signed)
New and persistent in AM today May be some better now after increasing fluids and walking more No signs to suggest heart, kidney or liver failure May have element of venous insufficiency Will check labs and urine microal  Ran out of effexor 3 days ago No withdrawal but not clear if that could be related to her edema  If not better by next week, can consider Rx for furosemide for occ use (40mg )

## 2012-06-17 NOTE — Assessment & Plan Note (Signed)
She feels she has been doing well Stopped the venlafaxine 3 days ago and no withdrawal Will keep off at her request---needs follow up with Dr Ermalene Searing soon to review

## 2012-06-18 LAB — MICROALBUMIN / CREATININE URINE RATIO
Creatinine, Urine: 41.9 mg/dL
Microalb Creat Ratio: 11.9 mg/g (ref 0.0–30.0)
Microalb, Ur: 0.5 mg/dL (ref 0.00–1.89)

## 2012-06-18 LAB — TSH: TSH: 1.095 u[IU]/mL (ref 0.350–4.500)

## 2012-06-21 ENCOUNTER — Telehealth: Payer: Self-pay | Admitting: Family Medicine

## 2012-06-21 ENCOUNTER — Encounter: Payer: Self-pay | Admitting: *Deleted

## 2012-06-21 NOTE — Telephone Encounter (Signed)
Noted  

## 2012-06-21 NOTE — Telephone Encounter (Signed)
Caller: [name]/Patient; Patient Name: Selena [name]; PCP: [facility]); Best Callback Phone Number: [phone]; Reason for call: Cough/Congestion. Caller reports 3 weeks of cold symptoms that were resolving. She then woke up this am with sore, scratchy throat, cough, some intermittent wheezing, and tightness in throat. Productive cough (green mucous) x 3 weeks now, with small amt of blood noted this am. 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  Seen in office on Friday 9/13 for swelling in her legs. Labs pending. Per Cough-Adult, Productive Cough with colored sputum, See in 24 hours. Caller unable to come in today until after 4pm.  Accepting of 15:30 appointment on Wednesday 9/18 with Dr Milinda Antis. Unable to come earlier in the day due to her work schedule. Home care for symptoms given.

## 2012-06-21 NOTE — Telephone Encounter (Signed)
I will see her then  

## 2012-06-22 ENCOUNTER — Ambulatory Visit (INDEPENDENT_AMBULATORY_CARE_PROVIDER_SITE_OTHER): Payer: BC Managed Care – PPO | Admitting: Family Medicine

## 2012-06-22 ENCOUNTER — Encounter: Payer: Self-pay | Admitting: Family Medicine

## 2012-06-22 VITALS — BP 112/80 | HR 74 | Temp 98.0°F | Ht 62.0 in | Wt 227.8 lb

## 2012-06-22 DIAGNOSIS — J209 Acute bronchitis, unspecified: Secondary | ICD-10-CM | POA: Insufficient documentation

## 2012-06-22 DIAGNOSIS — J040 Acute laryngitis: Secondary | ICD-10-CM | POA: Insufficient documentation

## 2012-06-22 MED ORDER — FLUCONAZOLE 150 MG PO TABS: 150.0000 mg | ORAL_TABLET | Freq: Once | ORAL | Status: DC

## 2012-06-22 MED ORDER — AZITHROMYCIN 250 MG PO TABS: ORAL_TABLET | ORAL | Status: DC

## 2012-06-22 MED ORDER — ALBUTEROL SULFATE HFA 108 (90 BASE) MCG/ACT IN AERS: 2.0000 | INHALATION_SPRAY | RESPIRATORY_TRACT | Status: DC | PRN

## 2012-06-22 NOTE — Progress Notes (Signed)
Subjective:    Patient ID: Selena [name], female    DOB: 08-31-1966, 46 y.o.   MRN: [id number]  HPI Here for uri symptoms - she is a pre K teacher Has had for 1 month  After 3 weeks got better and then got worse Woke up this wkend with tickle in throat  Fever / chills last night  Bad laryngitis today- very hoarse Tried to work today  Some cough -some prod of green mucous  Is exposed to a lot of things  Stomach is upset in general Some diarrhea for 2 days - eating crackers and drinking tea  (on and off for 4-5 months)  Is nauseated , not vomiting   Was on a day time and night time cold med -- last week  2 doses of ibuprofen for fever   Has a colonoscopy set up on Monday   Patient Active Problem List  Diagnosis  . PITUITARY ADENOMA, BENIGN  . HYPERLIPIDEMIA  . OBESITY, UNSPECIFIED  . ANEMIA-IRON DEFICIENCY  . DEPRESSION/ANXIETY  . COMMON MIGRAINE  . HYPERTENSION  . ASTHMA, EXERCISE INDUCED  . PREMENSTRUAL DYSPHORIC SYNDROME  . ACNE VULGARIS  . KNEE PAIN, BILATERAL  . FIBROMYALGIA  . CARDIAC MURMUR  . PREDIABETES  . HEAT INTOLERANCE  . PARESTHESIA, HANDS  . DYSPHAGIA PHARYNGEAL PHASE  . Allergic rhinitis  . RUQ abdominal pain  . Fatty liver  . Cellulitis  . Polyuria  . Back pain  . Dyspepsia and other specified disorders of function of stomach  . Dysphagia, unspecified  . Diarrhea  . Edema  . Bronchitis, acute  . Laryngitis, acute   Past Medical History  Diagnosis Date  . Anemia, iron deficiency   . Depression   . Hyperlipidemia   . Hypertension   . Anxiety   . Gallstones   . Fibromyalgia    Past Surgical History  Procedure Date  . 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 Tonsillectomy 1989  . Cholecystectomy    History  Substance Use Topics  . Smoking status: Never Smoker   . Smokeless tobacco: Never Used  . Alcohol Use: Yes     4 Times a month   Family History  Problem Relation Age of Onset  . Hyperlipidemia Mother   .  Heart disease Mother     Atrial fibrilation  . Cancer Mother     ? Melanoma  . Diabetes Father   . Mental illness Father     Bipolar  . Cancer Sister     Clear cell sarcoma in leg  . Hypothyroidism Sister   . Hypertension Maternal Grandmother   . Heart disease Maternal Grandmother     Afib  . Arthritis Paternal Grandmother   . Colon cancer Neg Hx   . Rectal cancer Neg Hx    Allergies  Allergen Reactions  . Sulfonamide Derivatives     REACTION: Itchy  . Topiramate     REACTION: bad reaction. put in hospital/chest pain   Current Outpatient Prescriptions on File Prior to Visit  Medication Sig Dispense Refill  . cetirizine (ZYRTEC) 10 MG tablet Take 10 mg by mouth daily.        . clonazePAM (KLONOPIN) 1 MG tablet Take 1 tablet (1 mg total) by mouth at bedtime.  30 tablet  0  . hyoscyamine (LEVSIN SL) 0.125 MG SL tablet Place 2 tablets (0.25 mg total) under the tongue every 4 (four) hours as needed for cramping.  30 tablet  2  .  omeprazole (PRILOSEC OTC) 20 MG tablet Take 20 mg by mouth daily as needed.        . ondansetron (ZOFRAN) 4 MG tablet Take 1 tablet (4 mg total) by mouth every 8 (eight) hours as needed for nausea.  20 tablet  1  . rizatriptan (MAXALT) 10 MG tablet TAKE 1 TABLET BY MOUTH AS NEEDED FOR MIGRAINE. MAY REPEAT IN 2 HOURS IF NEEDED  10 tablet  1  . simvastatin (ZOCOR) 20 MG tablet Take 1 tablet (20 mg total) by mouth daily.  30 tablet  11  . tiZANidine (ZANAFLEX) 4 MG capsule Take 4 mg by mouth as needed.      . venlafaxine XR (EFFEXOR-XR) 150 MG 24 hr capsule TAKE 1 CAPSULE BY MOUTH ONCE A DAY  30 capsule  1  . zonisamide (ZONEGRAN) 25 MG capsule       . albuterol (PROVENTIL HFA;VENTOLIN HFA) 108 (90 BASE) MCG/ACT inhaler Inhale 2 puffs into the lungs every 4 (four) hours as needed for wheezing.  1 Inhaler  1  . Na Sulfate-K Sulfate-Mg Sulf (SUPREP BOWEL PREP) SOLN Take 2 Bottles by mouth once.  2 Bottle  0      Review of Systems Review of Systems    Constitutional: Negative for  appetite change, and unexpected weight change.  Eyes: Negative for pain and visual disturbance.  ENT pos for cong and rhinorrhea with purulent discharge/ neg for ear pain  Respiratory: Negative for sob, pos for cough with mild wheeze Cardiovascular: Negative for cp or palpitations    Gastrointestinal: Negative for nausea, diarrhea and constipation.  Genitourinary: Negative for urgency and frequency.  Skin: Negative for pallor or rash   Neurological: Negative for weakness, light-headedness, numbness and headaches.  Hematological: Negative for adenopathy. Does not bruise/bleed easily.  Psychiatric/Behavioral: Negative for dysphoric mood. The patient is not nervous/anxious.         Objective:   Physical Exam  Constitutional: She appears well-developed and well-nourished. No distress.       Very hoarse voice  HENT:  Head: Normocephalic and atraumatic.  Right Ear: External ear normal.  Left Ear: External ear normal.  Mouth/Throat: Oropharynx is clear and moist. No oropharyngeal exudate.       Nares are injected and congested  No facial tenderness  Eyes: Conjunctivae normal and EOM are normal. Pupils are equal, round, and reactive to light. Right eye exhibits no discharge. Left eye exhibits no discharge.  Neck: Normal range of motion. Neck supple.  Cardiovascular: Normal rate, regular rhythm and normal heart sounds.   Pulmonary/Chest: Effort normal. No respiratory distress. She has wheezes. She has no rales.       Scant wheeze on forced exp only Few isolated rhonchi  Lymphadenopathy:    She has no cervical adenopathy.  Neurological: She is alert. No cranial nerve deficit.  Skin: Skin is warm and dry. No rash noted. No erythema.  Psychiatric: She has a normal mood and affect.          Assessment & Plan:

## 2012-06-22 NOTE — Assessment & Plan Note (Signed)
With acute bronchitis  Voice rest recommended Salt water gargles Out of work tomorrow Update if no improvement or if severe ST

## 2012-06-22 NOTE — Patient Instructions (Addendum)
Take zithromax as directed  Use albuterol inhaler as needed Drink lots of fluids Stay home tomorrow and rest your voice  Try mucinex DM for cough Update if not starting to improve in a week or if worsening

## 2012-06-22 NOTE — Assessment & Plan Note (Signed)
After 1 mo long uri - so will cover with zithromax Albuterol for mild reactive airways- will watch carefully mucinex DM prn /fluids/rest  Update if not starting to improve in a week or if worsening

## 2012-06-29 ENCOUNTER — Telehealth: Payer: Self-pay

## 2012-06-29 NOTE — Telephone Encounter (Signed)
Recommend follow up appt with me in next few days given SOB. If severe go to ER.

## 2012-06-29 NOTE — Telephone Encounter (Signed)
Pt saw Dr Milinda Antis 06/22/12; finished antibiotic 1st of week, still using mucinex dm and inhaler.Pt still has S/T(laryngitis is better),productive cough with pale yellow phlegm,SOB and gets easily winded, feels tired. No wheezing now. Last fever 06/26/12 at 99. [facility]. Pt did not want to schedule appt until gets doctor opinion. Please advise.

## 2012-06-30 NOTE — Telephone Encounter (Signed)
Left message on patient vm advising her to call the office to schedule appointment for follow up appt for SOB

## 2012-07-01 ENCOUNTER — Encounter: Payer: Self-pay | Admitting: Family Medicine

## 2012-07-01 ENCOUNTER — Ambulatory Visit (INDEPENDENT_AMBULATORY_CARE_PROVIDER_SITE_OTHER): Payer: BC Managed Care – PPO | Admitting: Family Medicine

## 2012-07-01 VITALS — BP 124/85 | HR 83 | Temp 98.1°F | Ht 62.0 in | Wt 228.2 lb

## 2012-07-01 DIAGNOSIS — J209 Acute bronchitis, unspecified: Secondary | ICD-10-CM

## 2012-07-01 MED ORDER — PREDNISONE (PAK) 10 MG PO TABS: ORAL_TABLET | ORAL | Status: DC

## 2012-07-01 MED ORDER — VENLAFAXINE HCL ER 150 MG PO CP24: 150.0000 mg | ORAL_CAPSULE | Freq: Every day | ORAL | Status: DC

## 2012-07-01 MED ORDER — CLONAZEPAM 1 MG PO TABS: 1.0000 mg | ORAL_TABLET | Freq: Every day | ORAL | Status: DC

## 2012-07-01 NOTE — Assessment & Plan Note (Addendum)
Peak flow 350 at best.. Predicted is 469 for her age and height.  No clear infeciton at this point. Treat with prednisone taper for asthma flare. Use albuterol prn.

## 2012-07-01 NOTE — Patient Instructions (Addendum)
Prednsione taper over next 6 days.  Use albuterol prn.  No antibiotic indicated.  Expect it will take some time to get fully better.. Expect 15-30 more days for cough to resolve completely. Shortnesss of breath should improve sooner.

## 2012-07-01 NOTE — Telephone Encounter (Signed)
Pt feeling uncomfortable in chest;? SOB; pt has not heard back from 06/29/12 call; pt concerned still not feeling well; Dr Ermalene Searing will see today 4:15(pt aware may have to wait).

## 2012-07-01 NOTE — Progress Notes (Signed)
  Subjective:    Patient ID: Selena [name], female    DOB: 1966-09-25, 46 y.o.   MRN: [id number]  HPI 46 year old female with history of exercise induced asthma presents for follow up unresolving URI. She was last seen by Dr. Karie Schwalbe on 9/18. As below: Bronchitis, acute and laryngitits - Roxy Manns, MD 06/22/2012 4:32 PM Signed  After 1 mo long uri - so will cover with zithromax  Albuterol for mild reactive airways- will watch carefully  mucinex DM prn /fluids/rest    Today she reports: mild improvement in last few weeks but not much better. She reports that her ears still hurt. She is still using inhaler 2 time a day. On zyrtec for allergies. Still with cough, productive, shortness of breath and fatigue. Chest feels heavy. She has chroinc chest pain unchanged.      Review of Systems  Constitutional: Negative for fever and fatigue.  HENT: Negative for ear pain.   Eyes: Negative for pain.  Respiratory: Positive for cough, chest tightness and shortness of breath.   Cardiovascular: Negative for chest pain, palpitations and leg swelling.  Gastrointestinal: Negative for abdominal pain.  Genitourinary: Negative for dysuria.       Objective:   Physical Exam  Constitutional: Vital signs are normal. She appears well-developed and well-nourished. She is cooperative.  Non-toxic appearance. She does not appear ill. No distress.  HENT:  Head: Normocephalic.  Right Ear: Hearing, tympanic membrane, external ear and ear canal normal. Tympanic membrane is not erythematous, not retracted and not bulging.  Left Ear: Hearing, tympanic membrane, external ear and ear canal normal. Tympanic membrane is not erythematous, not retracted and not bulging.  Nose: Mucosal edema and rhinorrhea present. Right sinus exhibits no maxillary sinus tenderness and no frontal sinus tenderness. Left sinus exhibits no maxillary sinus tenderness and no frontal sinus tenderness.  Mouth/Throat: Uvula is midline, oropharynx  is clear and moist and mucous membranes are normal.  Eyes: Conjunctivae normal, EOM and lids are normal. Pupils are equal, round, and reactive to light. No foreign bodies found.  Neck: Trachea normal and normal range of motion. Neck supple. Carotid bruit is not present. No mass and no thyromegaly present.  Cardiovascular: Normal rate, regular rhythm, S1 normal, S2 normal, normal heart sounds, intact distal pulses and normal pulses.  Exam reveals no gallop and no friction rub.   No murmur heard. Pulmonary/Chest: Effort normal and breath sounds normal. Not tachypneic. No respiratory distress. She has no decreased breath sounds. She has no wheezes. She has no rhonchi. She has no rales.  Neurological: She is alert.  Skin: Skin is warm, dry and intact. No rash noted.  Psychiatric: Her speech is normal and behavior is normal. Judgment normal. Her mood appears not anxious. Cognition and memory are normal. She does not exhibit a depressed mood.          Assessment & Plan:

## 2012-07-04 ENCOUNTER — Encounter: Payer: BC Managed Care – PPO | Admitting: Gastroenterology

## 2012-08-02 ENCOUNTER — Other Ambulatory Visit: Payer: Self-pay

## 2012-08-02 MED ORDER — CLONAZEPAM 1 MG PO TABS: 1.0000 mg | ORAL_TABLET | Freq: Every day | ORAL | Status: DC

## 2012-08-02 MED ORDER — ZONISAMIDE 25 MG PO CAPS: 25.0000 mg | ORAL_CAPSULE | Freq: Every day | ORAL | Status: DC

## 2012-08-02 NOTE — Telephone Encounter (Signed)
RX called into pharmacy

## 2012-08-02 NOTE — Telephone Encounter (Signed)
Pt request refill on Clonazepam and pt request refill on Zonisamide which is [facility].(cost pt $70 copay to see Dr Neale Burly that is why she request refills by Dr Ermalene Searing if possible) [facility].Please advise.

## 2012-08-22 ENCOUNTER — Telehealth: Payer: Self-pay

## 2012-08-22 NOTE — Telephone Encounter (Signed)
Pt said has a lot of stress and pt having chest pain on and off when feels anxious; pt said diagnosed with panic attacks at [facility] couple of years ago with chest pain. Pt having anxiety symptoms such as non stop eating, fatigue, lt breast feels tight like pt has milk for breast feeding but pt has not breast fed in 12 years( pt states Dr Ermalene Searing is aware of this). Pt did not want to go to work but loves her job; Pt does not want to schedule appt because she saw Dr Ermalene Searing end of Sept. Pt request change in Venlafaxine dosage or add another med with better calming effects. [facility].

## 2012-08-23 NOTE — Telephone Encounter (Signed)
Patient advised and will call insurance and see if she can afford it. She will call back and let us know

## 2012-08-23 NOTE — Telephone Encounter (Signed)
Appears that she has tried SSRi and amitryptiline in past. Not much more benefit on higher dose ven;lafaxine.  Have her look into cymbalta coverage for different option. Let me know if agreeable.

## 2012-08-26 ENCOUNTER — Telehealth: Payer: Self-pay

## 2012-08-26 NOTE — Telephone Encounter (Signed)
Pt has been seen by GI in last year.. So recommend appt there instead of here ( I am out of town next week as well).  If GI feels she needs to be seen here first make appt with Dr. Salena Saner.

## 2012-08-26 NOTE — Telephone Encounter (Signed)
Pt saw Dr Para March 08/2011; had [place] found fatty deposit of liver. Pt lost weight and pain went away. Pt said pain has returned about 1 week ago and still hurting; pain level now 4. Pt has not gained weight. Pt can feel  Hard spot in abdomen where hurts. Pt wanted to know if she should go to specialist or make appt to see Dr Ermalene Searing. [facility]. Pt request call back.

## 2012-08-26 NOTE — Telephone Encounter (Signed)
Patient advised.   Patient will call back if she needs an appt with Dr. Patsy Lager.

## 2012-09-07 ENCOUNTER — Other Ambulatory Visit: Payer: Self-pay | Admitting: Family Medicine

## 2012-09-08 NOTE — Telephone Encounter (Signed)
Rx phoned to pharmacy.  

## 2012-09-08 NOTE — Telephone Encounter (Signed)
Ok to refill #30, 0 refills 

## 2012-10-03 ENCOUNTER — Other Ambulatory Visit: Payer: Self-pay | Admitting: Family Medicine

## 2012-10-03 NOTE — Telephone Encounter (Signed)
[facility] but insurance is denying her treatment there now.  She needs a refill on her Maxalt we have given her before.

## 2012-10-03 NOTE — Telephone Encounter (Signed)
Pt said she uses [facility] for a pharmacy. Pt has new pt appt scheduled 01/17/13 with Dr Duncan Dull.Please advise.

## 2012-10-04 MED ORDER — RIZATRIPTAN BENZOATE 10 MG PO TABS: 10.0000 mg | ORAL_TABLET | ORAL | Status: DC | PRN

## 2012-10-05 DIAGNOSIS — K635 Polyp of colon: Secondary | ICD-10-CM

## 2012-10-05 HISTORY — DX: Polyp of colon: K63.5

## 2012-10-07 ENCOUNTER — Encounter: Payer: Self-pay | Admitting: Family Medicine

## 2012-10-07 ENCOUNTER — Ambulatory Visit (INDEPENDENT_AMBULATORY_CARE_PROVIDER_SITE_OTHER): Payer: BC Managed Care – PPO | Admitting: Family Medicine

## 2012-10-07 VITALS — BP 120/72 | HR 98 | Temp 98.6°F | Ht 62.0 in | Wt 229.8 lb

## 2012-10-07 DIAGNOSIS — R1011 Right upper quadrant pain: Secondary | ICD-10-CM

## 2012-10-07 DIAGNOSIS — S39012A Strain of muscle, fascia and tendon of lower back, initial encounter: Secondary | ICD-10-CM

## 2012-10-07 DIAGNOSIS — S339XXA Sprain of unspecified parts of lumbar spine and pelvis, initial encounter: Secondary | ICD-10-CM

## 2012-10-07 MED ORDER — DICLOFENAC SODIUM 75 MG PO TBEC: 75.0000 mg | DELAYED_RELEASE_TABLET | Freq: Two times a day (BID) | ORAL | Status: DC

## 2012-10-07 MED ORDER — CYCLOBENZAPRINE HCL 10 MG PO TABS: 5.0000 mg | ORAL_TABLET | Freq: Every evening | ORAL | Status: DC | PRN

## 2012-10-07 NOTE — Patient Instructions (Addendum)
Eat on low back. Bentle stretching exercise. Muscle relaxant at night, diclofenac twice daily with food.  No lifting > 10 lbs. Follow up if not improving in 2 weeks.

## 2012-10-07 NOTE — Assessment & Plan Note (Signed)
Lilkely due to fatty liver. Also small tender lipoma, pt reassured.

## 2012-10-07 NOTE — Assessment & Plan Note (Signed)
NSAID, muscle relaxant, heat and exercises. Info given.

## 2012-10-07 NOTE — Progress Notes (Signed)
  Subjective:    Patient ID: Selena [name], female    DOB: 12-Mar-1966, 47 y.o.   MRN: [id number]  HPI  47 year old female presents with  1-2 months of low back pain. Aross low back B.  Increases when go lying to standing. Mild pain at rest. No injury, no fall. No change in activity. No radiation of pain. No numbness, No weakness in legs.  Has used motrin off and on, helps some temporarily.  No back surgery, no history of back problems.  Review of Systems  Constitutional: Negative for fever and fatigue.  HENT: Negative for ear pain.   Eyes: Negative for pain.  Respiratory: Negative for chest tightness and shortness of breath.   Cardiovascular: Negative for chest pain, palpitations and leg swelling.  Gastrointestinal: Negative for abdominal pain.  Genitourinary: Negative for dysuria.       Objective:   Physical Exam  Constitutional: Vital signs are normal. She appears well-developed and well-nourished. She is cooperative.  Non-toxic appearance. She does not appear ill. No distress.  HENT:  Head: Normocephalic.  Right Ear: Hearing, tympanic membrane, external ear and ear canal normal. Tympanic membrane is not erythematous, not retracted and not bulging.  Left Ear: Hearing, tympanic membrane, external ear and ear canal normal. Tympanic membrane is not erythematous, not retracted and not bulging.  Nose: No mucosal edema or rhinorrhea. Right sinus exhibits no maxillary sinus tenderness and no frontal sinus tenderness. Left sinus exhibits no maxillary sinus tenderness and no frontal sinus tenderness.  Mouth/Throat: Uvula is midline, oropharynx is clear and moist and mucous membranes are normal.  Eyes: Conjunctivae normal, EOM and lids are normal. Pupils are equal, round, and reactive to light. No foreign bodies found.  Neck: Trachea normal and normal range of motion. Neck supple. Carotid bruit is not present. No mass and no thyromegaly present.  Cardiovascular: Normal rate, regular  rhythm, S1 normal, S2 normal, normal heart sounds, intact distal pulses and normal pulses.  Exam reveals no gallop and no friction rub.   No murmur heard. Pulmonary/Chest: Effort normal and breath sounds normal. Not tachypneic. No respiratory distress. She has no decreased breath sounds. She has no wheezes. She has no rhonchi. She has no rales.  Abdominal: Soft. Normal appearance and bowel sounds are normal. There is no tenderness.       ttp over right upper quadrantr, small tender likely  lipoma in right mid abdomen  Musculoskeletal:       Lumbar back: She exhibits decreased range of motion, tenderness and bony tenderness. She exhibits no swelling.       Neg SLR, neg faber  B ttp over paraspinous muscles.  Neurological: She is alert.  Skin: Skin is warm, dry and intact. No rash noted.  Psychiatric: Her speech is normal and behavior is normal. Judgment and thought content normal. Her mood appears not anxious. Cognition and memory are normal. She does not exhibit a depressed mood.          Assessment & Plan:

## 2012-10-19 ENCOUNTER — Other Ambulatory Visit: Payer: Self-pay | Admitting: Family Medicine

## 2012-11-05 ENCOUNTER — Ambulatory Visit (INDEPENDENT_AMBULATORY_CARE_PROVIDER_SITE_OTHER): Payer: BC Managed Care – PPO | Admitting: Family Medicine

## 2012-11-05 ENCOUNTER — Encounter: Payer: Self-pay | Admitting: Family Medicine

## 2012-11-05 VITALS — BP 122/92 | HR 118 | Temp 97.2°F | Wt 224.0 lb

## 2012-11-05 DIAGNOSIS — B9789 Other viral agents as the cause of diseases classified elsewhere: Secondary | ICD-10-CM

## 2012-11-05 DIAGNOSIS — B349 Viral infection, unspecified: Secondary | ICD-10-CM

## 2012-11-05 MED ORDER — ONDANSETRON HCL 4 MG PO TABS: 4.0000 mg | ORAL_TABLET | Freq: Three times a day (TID) | ORAL | Status: DC | PRN

## 2012-11-05 NOTE — Progress Notes (Signed)
Chief Complaint  Patient presents with  . Nausea    x 2 days.   . Diarrhea  . Headache    HPI:  Urgent Care Visit for ? Flu: -started: 3 days ago -symptoms:nasal congestion, sore throat, cough, drainage, HA, wheeze a few days ago, body aches, diarrhea, nausea -denies:fever, SOB, NVD, tooth pain, abd pain -has tried: none -sick contacts: few kids in her class with stomach flu -Hx of: no hx of chronic lung disease, diabetes  -has not had flu vaccine  ROS: See pertinent positives and negatives per HPI.  Past Medical History  Diagnosis Date  . Anemia, iron deficiency   . Depression   . Hyperlipidemia   . Hypertension   . Anxiety   . Gallstones   . Fibromyalgia     Family History  Problem Relation Age of Onset  . Hyperlipidemia Mother   . Heart disease Mother     Atrial fibrilation  . Cancer Mother     ? Melanoma  . Diabetes Father   . Mental illness Father     Bipolar  . Cancer Sister     Clear cell sarcoma in leg  . Hypothyroidism Sister   . Hypertension Maternal Grandmother   . Heart disease Maternal Grandmother     Afib  . Arthritis Paternal Grandmother   . Colon cancer Neg Hx   . Rectal cancer Neg Hx     History   Social History  . Marital Status: Married    Spouse Name: N/A    Number of Children: 1  . Years of Education: N/A   Occupational History  . Teaches preschool    Social History Main Topics  . Smoking status: Never Smoker   . Smokeless tobacco: Never Used  . Alcohol Use: Yes     Comment: 4 Times a month  . Drug Use: No  . Sexually Active: None   Other Topics Concern  . None   Social History Narrative   2 Step children; asthma, ADHD    Current outpatient prescriptions:albuterol (PROVENTIL HFA;VENTOLIN HFA) 108 (90 BASE) MCG/ACT inhaler, Inhale 2 puffs into the lungs every 4 (four) hours as needed for wheezing., Disp: 1 Inhaler, Rfl: 1;  cetirizine (ZYRTEC) 10 MG tablet, Take 10 mg by mouth daily.  , Disp: , Rfl: ;  clonazePAM  (KLONOPIN) 1 MG tablet, TAKE 1 TABLET BY MOUTH AT BEDTIME, Disp: 30 tablet, Rfl: 0 cyclobenzaprine (FLEXERIL) 10 MG tablet, Take 0.5-1 tablets (5-10 mg total) by mouth at bedtime as needed for muscle spasms., Disp: 15 tablet, Rfl: 0;  diclofenac (VOLTAREN) 75 MG EC tablet, Take 1 tablet (75 mg total) by mouth 2 (two) times daily., Disp: 30 tablet, Rfl: 0;  omeprazole (PRILOSEC OTC) 20 MG tablet, Take 20 mg by mouth daily as needed.  , Disp: , Rfl:  ondansetron (ZOFRAN) 4 MG tablet, Take 1 tablet (4 mg total) by mouth every 8 (eight) hours as needed for nausea., Disp: 20 tablet, Rfl: 0;  rizatriptan (MAXALT) 10 MG tablet, Take 1 tablet (10 mg total) by mouth as needed for migraine. May repeat in 2 hours if needed, Disp: 10 tablet, Rfl: 1;  simvastatin (ZOCOR) 20 MG tablet, Take 1 tablet (20 mg total) by mouth daily., Disp: 30 tablet, Rfl: 11 venlafaxine XR (EFFEXOR-XR) 150 MG 24 hr capsule, Take 1 capsule (150 mg total) by mouth daily., Disp: 30 capsule, Rfl: 11;  zonisamide (ZONEGRAN) 25 MG capsule, Take 1 capsule (25 mg total) by mouth daily., Disp: 30  capsule, Rfl: 0  EXAM:  Filed Vitals:   11/05/12 1114  BP: 122/92  Pulse: 118  Temp: 97.2 F (36.2 C)    There is no height on file to calculate BMI.  GENERAL: vitals reviewed and listed above, alert, oriented, appears well hydrated and in no acute distress  HEENT: atraumatic, conjunttiva clear, no obvious abnormalities on inspection of external nose and ears  NECK: no obvious masses on inspection  LUNGS: clear to auscultation bilaterally, no wheezes, rales or rhonchi, good air movement  CV: HRRR, no peripheral edema  ABD: soft, BS +, NTTP  MS: moves all extremities without noticeable abnormality  PSYCH: pleasant and cooperative, no obvious depression or anxiety  ASSESSMENT AND PLAN:  Discussed the following assessment and plan:  1. Viral syndrome    -likely viral, exam benign -flu unlikely, discussed tamiflu risks versus  benefits and decided against this -pt able to tolerate fluids -supportive care and return precuations -Patient advised to return or notify a doctor immediately if symptoms worsen or persist or new concerns arise.  There are no Patient Instructions on file for this visit.   Kriste Basque R.

## 2012-11-05 NOTE — Patient Instructions (Addendum)
Viral Gastroenteritis Viral gastroenteritis is also known as stomach flu. This condition affects the stomach and intestinal tract. It can cause sudden diarrhea and vomiting. The illness typically lasts 3 to 8 days. Most people develop an immune response that eventually gets rid of the virus. While this natural response develops, the virus can make you quite ill. CAUSES  Many different viruses can cause gastroenteritis, such as rotavirus or noroviruses. You can catch one of these viruses by consuming contaminated food or water. You may also catch a virus by sharing utensils or other personal items with an infected person or by touching a contaminated surface. SYMPTOMS  The most common symptoms are diarrhea and vomiting. These problems can cause a severe loss of body fluids (dehydration) and a body salt (electrolyte) imbalance. Other symptoms may include:  Fever.  Headache.  Fatigue.  Abdominal pain. DIAGNOSIS  Your caregiver can usually diagnose viral gastroenteritis based on your symptoms and a physical exam. A stool sample may also be taken to test for the presence of viruses or other infections. TREATMENT  This illness typically goes away on its own. Treatments are aimed at rehydration. The most serious cases of viral gastroenteritis involve vomiting so severely that you are not able to keep fluids down. In these cases, fluids must be given through an intravenous line (IV). HOME CARE INSTRUCTIONS   Drink enough fluids to keep your urine clear or pale yellow. Drink small amounts of fluids frequently and increase the amounts as tolerated.  Ask your caregiver for specific rehydration instructions.  Avoid:  Foods high in sugar.  Alcohol.  Carbonated drinks.  Tobacco.  Juice.  Caffeine drinks.  Extremely hot or cold fluids.  Fatty, greasy foods.  Too much intake of anything at one time.  Dairy products until 24 to 48 hours after diarrhea stops.  You may consume probiotics.  Probiotics are active cultures of beneficial bacteria. They may lessen the amount and number of diarrheal stools in adults. Probiotics can be found in yogurt with active cultures and in supplements.  Wash your hands well to avoid spreading the virus.  Only take over-the-counter or prescription medicines for pain, discomfort, or fever as directed by your caregiver. Do not give aspirin to children. Antidiarrheal medicines are not recommended.  Ask your caregiver if you should continue to take your regular prescribed and over-the-counter medicines.  Keep all follow-up appointments as directed by your caregiver. SEEK IMMEDIATE MEDICAL CARE IF:   You are unable to keep fluids down.  You do not urinate at least once every 6 to 8 hours.  You develop shortness of breath.  You notice blood in your stool or vomit. This may look like coffee grounds.  You have abdominal pain that increases or is concentrated in one small area (localized).  You have persistent vomiting or diarrhea.  You have a fever.  The patient is a child younger than 3 months, and he or she has a fever.  The patient is a child older than 3 months, and he or she has a fever and persistent symptoms.  The patient is a child older than 3 months, and he or she has a fever and symptoms suddenly get worse.  The patient is a baby, and he or she has no tears when crying. MAKE SURE YOU:   Understand these instructions.  Will watch your condition.  Will get help right away if you are not doing well or get worse. Document Released: 09/21/2005 Document Revised: 12/14/2011 Document Reviewed: 07/08/2011   Exit[employer] Patient Information 2013 [employer].  

## 2012-11-28 ENCOUNTER — Other Ambulatory Visit: Payer: Self-pay | Admitting: Family Medicine

## 2012-12-16 ENCOUNTER — Encounter: Payer: Self-pay | Admitting: Family Medicine

## 2012-12-16 ENCOUNTER — Ambulatory Visit (INDEPENDENT_AMBULATORY_CARE_PROVIDER_SITE_OTHER): Payer: BC Managed Care – PPO | Admitting: Family Medicine

## 2012-12-16 VITALS — BP 120/78 | HR 90 | Temp 98.0°F | Ht 62.0 in | Wt 228.8 lb

## 2012-12-16 DIAGNOSIS — F341 Dysthymic disorder: Secondary | ICD-10-CM

## 2012-12-16 DIAGNOSIS — J020 Streptococcal pharyngitis: Secondary | ICD-10-CM

## 2012-12-16 DIAGNOSIS — H6980 Other specified disorders of Eustachian tube, unspecified ear: Secondary | ICD-10-CM

## 2012-12-16 DIAGNOSIS — H6983 Other specified disorders of Eustachian tube, bilateral: Secondary | ICD-10-CM

## 2012-12-16 LAB — POCT RAPID STREP A (OFFICE): Rapid Strep A Screen: NEGATIVE

## 2012-12-16 MED ORDER — FLUOXETINE HCL 20 MG PO TABS: 20.0000 mg | ORAL_TABLET | Freq: Every day | ORAL | Status: DC

## 2012-12-16 MED ORDER — FLUTICASONE PROPIONATE 50 MCG/ACT NA SUSP: 2.0000 | Freq: Every day | NASAL | Status: DC

## 2012-12-16 NOTE — Assessment & Plan Note (Signed)
Poor control mood. Stop Effexor and change to prozac 20 mg daily. Follow up in 1 month.

## 2012-12-16 NOTE — Patient Instructions (Addendum)
Start nasal steroid spray 2 sprays per day. Ibuprofen for pain. Start back prozac 20 mg daily. Follow up in 1 month for re-eval of mood.

## 2012-12-16 NOTE — Assessment & Plan Note (Signed)
Clear fluid behid B ears likely causing ear fullness and pain. Treat with nasal steroid spray.  Strep test negative.

## 2012-12-16 NOTE — Progress Notes (Signed)
  Subjective:    Patient ID: Selena [name], female    DOB: 10-05-1966, 47 y.o.   MRN: [id number]  Otalgia  There is pain in both (started as itching now pain in right ear) ears. This is a new problem. The current episode started 1 to 4 weeks ago (2 weeks). The problem has been gradually worsening. There has been no fever. The pain is moderate. Associated symptoms include a sore throat. Pertinent negatives include no abdominal pain, coughing, ear discharge, hearing loss, rash or rhinorrhea. Associated symptoms comments: No sinus pressure.. She has tried NSAIDs for the symptoms. The treatment provided moderate relief. There is no history of a chronic ear infection, hearing loss or a tympanostomy tube.   No allergy symptoms.  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  Depression/anxiety: effexor no longer working. She wishes to restart zoloft.   Chest pains, panicky symptoms.  Review of Systems  HENT: Positive for ear pain and sore throat. Negative for hearing loss, rhinorrhea and ear discharge.   Respiratory: Negative for cough.   Gastrointestinal: Negative for abdominal pain.  Skin: Negative for rash.  Psychiatric/Behavioral: Positive for behavioral problems, decreased concentration and agitation. Negative for hallucinations, sleep disturbance and self-injury. The patient is nervous/anxious.        Objective:   Physical Exam  Constitutional: Vital signs are normal. She appears well-developed and well-nourished. She is cooperative.  Non-toxic appearance. She does not appear ill. No distress.  HENT:  Head: Normocephalic.  Right Ear: Hearing, tympanic membrane, external ear and ear canal normal. Tympanic membrane is not erythematous, not retracted and not bulging.  Left Ear: Hearing, tympanic membrane, external ear and ear canal normal. Tympanic membrane is not erythematous, not retracted and not bulging.  Nose: Mucosal edema and rhinorrhea present. Right sinus exhibits no maxillary sinus tenderness and  no frontal sinus tenderness. Left sinus exhibits no maxillary sinus tenderness and no frontal sinus tenderness.  Mouth/Throat: Uvula is midline and mucous membranes are normal. Posterior oropharyngeal edema and posterior oropharyngeal erythema present. No oropharyngeal exudate.  Eyes: Conjunctivae, EOM and lids are normal. Pupils are equal, round, and reactive to light. No foreign bodies found.  Neck: Trachea normal and normal range of motion. Neck supple. Carotid bruit is not present. No mass and no thyromegaly present.  Cardiovascular: Normal rate, regular rhythm, S1 normal, S2 normal, normal heart sounds, intact distal pulses and normal pulses.  Exam reveals no gallop and no friction rub.   No murmur heard. Pulmonary/Chest: Effort normal and breath sounds normal. Not tachypneic. No respiratory distress. She has no decreased breath sounds. She has no wheezes. She has no rhonchi. She has no rales.  Neurological: She is alert.  Skin: Skin is warm, dry and intact. No rash noted.  Psychiatric: Her speech is normal and behavior is normal. Judgment normal. Her mood appears anxious. Cognition and memory are normal. She does not exhibit a depressed mood.          Assessment & Plan:

## 2012-12-21 ENCOUNTER — Telehealth: Payer: Self-pay

## 2012-12-21 NOTE — Telephone Encounter (Signed)
Pt is not having ear drainage,no hearing loss,no sinus pressure,no fever. Rt ear feels warm to touch. Pain level now is 4. [facility].

## 2012-12-21 NOTE — Telephone Encounter (Signed)
Pt seen 12/16/12 with fluid behind ears; pt has been using nose spray and Motrin; lt ear is OK now but rt ear is still hurting. Pt wants to know if anything else can be done. Left v/m for pt to call back to see if pt has ear drainage,hearing loss,sinus pressure,fever and pain level now. Also what local pharmacy pt uses.

## 2012-12-22 MED ORDER — AMOXICILLIN 500 MG PO CAPS: 1000.0000 mg | ORAL_CAPSULE | Freq: Two times a day (BID) | ORAL | Status: DC

## 2012-12-22 MED ORDER — FLUCONAZOLE 150 MG PO TABS: 150.0000 mg | ORAL_TABLET | Freq: Once | ORAL | Status: DC

## 2012-12-22 NOTE — Telephone Encounter (Signed)
Patient advised and medication called to pharmacy  

## 2012-12-22 NOTE — Telephone Encounter (Signed)
Should that be "now having ear drainage"? If so call in amoxicillin 500 mg 2 tab po BID x 10 days, #40, 0rf.  If it is supposed to be "no" let me know.

## 2012-12-27 ENCOUNTER — Other Ambulatory Visit: Payer: Self-pay | Admitting: Family Medicine

## 2012-12-29 ENCOUNTER — Other Ambulatory Visit: Payer: Self-pay | Admitting: *Deleted

## 2012-12-29 NOTE — Telephone Encounter (Signed)
RX CALLED TO PHARMACY  

## 2013-01-02 ENCOUNTER — Ambulatory Visit (INDEPENDENT_AMBULATORY_CARE_PROVIDER_SITE_OTHER): Payer: BC Managed Care – PPO | Admitting: Family Medicine

## 2013-01-02 ENCOUNTER — Encounter: Payer: Self-pay | Admitting: Family Medicine

## 2013-01-02 VITALS — BP 116/68 | HR 83 | Temp 99.0°F | Wt 240.0 lb

## 2013-01-02 DIAGNOSIS — R109 Unspecified abdominal pain: Secondary | ICD-10-CM

## 2013-01-02 DIAGNOSIS — R197 Diarrhea, unspecified: Secondary | ICD-10-CM

## 2013-01-02 LAB — POCT URINALYSIS DIPSTICK
Bilirubin, UA: NEGATIVE
Glucose, UA: NEGATIVE
Ketones, UA: NEGATIVE
Leukocytes, UA: NEGATIVE
Nitrite, UA: NEGATIVE
Protein, UA: NEGATIVE
Spec Grav, UA: 1.02
Urobilinogen, UA: 0.2
pH, UA: 5

## 2013-01-02 NOTE — Patient Instructions (Addendum)
Let's check urine to ensure no urinary infection today. I think this is a combination of viral gastroenteritis and recent antibiotic use. Push fluids, rest, and out of work for the next 2 days.   Clear liquid diet - gingerale, chicken broth, jello - until feeling better. Please call us if symptoms worsen instead of improving or abdominal pain/diarrhea not resolving over next few days.

## 2013-01-02 NOTE — Assessment & Plan Note (Signed)
4d h/o diarrhea associated with RUQ > generalized abdominal discomfort and nausea. In setting of recent amoxicillin course for presumed ear infection. Anticipate combination of antibiotic associated GI upset and viral gastroenteritis. Discussed supportive measures - see pt instructions. If worsening or not improving as expected, to return for further eval, consider blood work vs stool cultures in recent abx use. Pt agrees with plan. UA - trace blood, micro 0 RBC.

## 2013-01-02 NOTE — Progress Notes (Signed)
  Subjective:    Patient ID: Selena [name], female    DOB: 1966/05/29, 47 y.o.   MRN: [id number]  HPI CC: abd pain, diarrhea  Reviewed recent note from Dr. Leonard Schwartz - thought ETD, then ear pain progressed so treated with amoxicillin given concern for sinusitis/AOM.  For last 5 days, having pain in stomach described as sore abdomen.  Points to RUQ as site of pain.  Has had associated diarrhea over last 5 days described as watery without bloood - worse with food.  Sore abdomen with any PO intake.  Staying nauseated.  + cough. Able to go to work on Friday, but no appetite.  In bed all weekend. Diaphoretic when stands up. Dry heaves this morning. No new foods, travel.  No sick contacts at home. 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   Does not take flu shot 2/2 egg allergies. H/o NAFLD.  No fevers/chills, new rashes, headaches.  H/o migraines. Denies dysuria or urinary changes. Has not tried zofran but has at home.  Did recently complete amoxicillin course for ear infection. Has held most of her meds in hopes of improving sxs.  Past Medical History  Diagnosis Date  . Anemia, iron deficiency   . Depression   . Hyperlipidemia   . Hypertension   . Anxiety   . Gallstones   . Fibromyalgia     Past Surgical History  Procedure Laterality Date  . 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 Tonsillectomy  1989  . Cholecystectomy      Review of Systems Per HPI    Objective:   Physical Exam  Nursing note and vitals reviewed. Constitutional: She appears well-developed and well-nourished. No distress.  HENT:  Head: Normocephalic and atraumatic.  Right Ear: Hearing, tympanic membrane, external ear and ear canal normal.  Left Ear: Hearing, tympanic membrane, external ear and ear canal normal.  Nose: No mucosal edema or rhinorrhea. Right sinus exhibits no maxillary sinus tenderness and no frontal sinus tenderness. Left sinus exhibits no maxillary sinus  tenderness and no frontal sinus tenderness.  Mouth/Throat: Uvula is midline, oropharynx is clear and moist and mucous membranes are normal. No oropharyngeal exudate, posterior oropharyngeal edema, posterior oropharyngeal erythema or tonsillar abscesses.  Some fluid behind TMs L>R  Eyes: Conjunctivae and EOM are normal. Pupils are equal, round, and reactive to light. No scleral icterus.  Neck: Normal range of motion. Neck supple.  Cardiovascular: Normal rate, regular rhythm, normal heart sounds and intact distal pulses.   No murmur heard. Pulmonary/Chest: Effort normal and breath sounds normal. No respiratory distress. She has no wheezes. She has no rales.  Abdominal: Soft. Normal appearance and bowel sounds are normal. She exhibits no distension and no mass. There is no hepatosplenomegaly. There is tenderness (mainly RUQ but mild diffusely) in the right upper quadrant. There is no rigidity, no rebound, no guarding, no CVA tenderness and negative Murphy's sign.  obese  Lymphadenopathy:    She has no cervical adenopathy.  Skin: Skin is warm and dry. No rash noted.       Assessment & Plan:

## 2013-01-04 ENCOUNTER — Telehealth: Payer: Self-pay

## 2013-01-04 NOTE — Telephone Encounter (Signed)
How is diarrhea?  If better, would monitor for now.  If persists into next week, to return for further evaluation.  If any worsening, to come in sooner or seek urgent care. Does she still have zofran for nausea?  Would recommend use this.  If not I can send in anti nausea med to use prn.

## 2013-01-04 NOTE — Telephone Encounter (Signed)
Pt concerned that she is not feeling better since seen on 01/02/13. Pt constantly nauseated; can't eat; is able to keep down small amt of grits and crackers and powerade.pt said feels like something stuck in throat when tries to eat (but nothing is there). RUQ discomfort is the same as when seen 01/02/13. Lower lt back pain is worse; today pain level is 6 (on 01/02/13 was 3). Pt feels light headed at times. No fever or sweats now. [facility]. Please advise.

## 2013-01-04 NOTE — Telephone Encounter (Signed)
Forwarded to Dr. Patsy Lager as Dr. Reece Agar not in the office this afternoon.

## 2013-01-04 NOTE — Telephone Encounter (Signed)
This seems non-urgent, and I will defer to Dr. Reece Agar who evaluated the patient. I would not alter plan of care. Likely severe GI bug, which have been common recently.

## 2013-01-05 NOTE — Telephone Encounter (Signed)
Spoke with patient and diarrhea is better and she is feeling better, but she does still have some abd pain and nausea. She thinks she has some zofran-she is going to check and call me back if she needs some. She understood if worsening over the weekend to seek urgent care and if no better by next week, to call for recheck. She thinks she is on the mend though.

## 2013-01-05 NOTE — Telephone Encounter (Signed)
Noted. Thanks.

## 2013-01-16 ENCOUNTER — Telehealth: Payer: Self-pay | Admitting: Gastroenterology

## 2013-01-16 ENCOUNTER — Telehealth: Payer: Self-pay

## 2013-01-16 NOTE — Telephone Encounter (Signed)
I'm sorry she's not feeling better. I see she has scheduled appt with GI.   Recommend bland foods until seen by them.

## 2013-01-16 NOTE — Telephone Encounter (Signed)
Pt seen 01/02/13 with similar symptoms. Pt did improve but [place]; on 01/13/13 pt not able to process food; felt like was stuck in stomach and pt vomited back up on 01/14/13 in AM. On Sat and Sun pt drank liquids. Last time vomited 01/15/13(last night). Today pt has kept down 2 plain pancakes and 1/2 [place] sandwich with water and green tea to drink.Still upper lt and rt abdominal pain. ? Fever (no thermometer). Pt taking omeprazole and ondansetron (med not really helping).  Pt will return home on 01/17/13 around 3-4 pm. Pt has seen [facility] before; pt does not think this is the flu. What to do next.Please advise. (pt does not know name of pharmacy [place] beach). Dr Ermalene Searing not in office and pt saw Dr Reece Agar on 01/02/13.

## 2013-01-16 NOTE — Telephone Encounter (Signed)
Patient is out of town returning tomorrow late afternoon. She states she has had N,V,D off and on since 01/02/13. She thought it was norovirus but not sure now. She gets better for a few days then is sick again.Scheduled with Willette Cluster, NP on 01/19/13 at 8:30 AM.

## 2013-01-17 ENCOUNTER — Ambulatory Visit: Payer: BC Managed Care – PPO | Admitting: Internal Medicine

## 2013-01-17 NOTE — Telephone Encounter (Signed)
Message left for patient to return my call.  

## 2013-01-19 ENCOUNTER — Encounter: Payer: Self-pay | Admitting: Nurse Practitioner

## 2013-01-19 ENCOUNTER — Other Ambulatory Visit (INDEPENDENT_AMBULATORY_CARE_PROVIDER_SITE_OTHER): Payer: BC Managed Care – PPO

## 2013-01-19 ENCOUNTER — Ambulatory Visit (INDEPENDENT_AMBULATORY_CARE_PROVIDER_SITE_OTHER): Payer: BC Managed Care – PPO | Admitting: Nurse Practitioner

## 2013-01-19 ENCOUNTER — Encounter: Payer: Self-pay | Admitting: Gastroenterology

## 2013-01-19 VITALS — BP 110/80 | HR 88 | Ht 63.0 in | Wt 229.0 lb

## 2013-01-19 DIAGNOSIS — R1011 Right upper quadrant pain: Secondary | ICD-10-CM

## 2013-01-19 DIAGNOSIS — R1310 Dysphagia, unspecified: Secondary | ICD-10-CM | POA: Insufficient documentation

## 2013-01-19 DIAGNOSIS — R1313 Dysphagia, pharyngeal phase: Secondary | ICD-10-CM

## 2013-01-19 DIAGNOSIS — R110 Nausea: Secondary | ICD-10-CM

## 2013-01-19 DIAGNOSIS — R197 Diarrhea, unspecified: Secondary | ICD-10-CM

## 2013-01-19 LAB — CBC WITH DIFFERENTIAL/PLATELET
Basophils Absolute: 0.1 10*3/uL (ref 0.0–0.1)
Basophils Relative: 1.2 % (ref 0.0–3.0)
Eosinophils Absolute: 0.1 10*3/uL (ref 0.0–0.7)
Eosinophils Relative: 1.2 % (ref 0.0–5.0)
HCT: 39.6 % (ref 36.0–46.0)
Hemoglobin: 13.6 g/dL (ref 12.0–15.0)
Lymphocytes Relative: 38 % (ref 12.0–46.0)
Lymphs Abs: 2.2 10*3/uL (ref 0.7–4.0)
MCHC: 34.4 g/dL (ref 30.0–36.0)
MCV: 90.1 fl (ref 78.0–100.0)
Monocytes Absolute: 0.4 10*3/uL (ref 0.1–1.0)
Monocytes Relative: 7.4 % (ref 3.0–12.0)
Neutro Abs: 3.1 10*3/uL (ref 1.4–7.7)
Neutrophils Relative %: 52.2 % (ref 43.0–77.0)
Platelets: 340 10*3/uL (ref 150.0–400.0)
RBC: 4.4 Mil/uL (ref 3.87–5.11)
RDW: 13.1 % (ref 11.5–14.6)
WBC: 5.9 10*3/uL (ref 4.5–10.5)

## 2013-01-19 LAB — COMPREHENSIVE METABOLIC PANEL
ALT: 53 U/L — ABNORMAL HIGH (ref 0–35)
AST: 32 U/L (ref 0–37)
Albumin: 3.7 g/dL (ref 3.5–5.2)
Alkaline Phosphatase: 50 U/L (ref 39–117)
BUN: 10 mg/dL (ref 6–23)
CO2: 27 mEq/L (ref 19–32)
Calcium: 8.8 mg/dL (ref 8.4–10.5)
Chloride: 105 mEq/L (ref 96–112)
Creatinine, Ser: 0.8 mg/dL (ref 0.4–1.2)
GFR: 84.98 mL/min (ref >60.00)
Glucose, Bld: 89 mg/dL (ref 70–99)
Potassium: 4 mEq/L (ref 3.5–5.1)
Sodium: 139 mEq/L (ref 135–145)
Total Bilirubin: 0.4 mg/dL (ref 0.3–1.2)
Total Protein: 6.7 g/dL (ref 6.0–8.3)

## 2013-01-19 MED ORDER — OMEPRAZOLE MAGNESIUM 20 MG PO TBEC: 20.0000 mg | DELAYED_RELEASE_TABLET | Freq: Every day | ORAL | Status: DC

## 2013-01-19 MED ORDER — MOVIPREP 100 G PO SOLR: 1.0000 | Freq: Once | ORAL | Status: DC

## 2013-01-19 NOTE — Progress Notes (Signed)
01/19/2013 Selena [name] [id number] 27-Jan-1966   History of Present Illness:  Patient is a 64 year o ld female known to Dr. Arlyce Dice for a history of an esophageal stricture. She was seen last August 2013 following esophageal dilation. At that visit she reported some rectal bleeding. A colonoscopy was scheduled but patient later canceled with intentions to do later in the year.   Patient here with multiple GI complaints. She has RUQ pain, intemittent nausea, vomiting and diarrhea. She takes Prilosec as needed for reflux. She has chronic constipation but now here with a 4 month history of intermittent diarrhea. In between episodes of n, v, and diarrhea she feels okay. Of note, she had a normal gastric emptying study July 2013.  The RUQ pain is worse during episodes of nausea and vomiting. She is s/p cholecystectomy.  Current Medications, Allergies, Past Medical History, Past Surgical History, Family History and Social History were reviewed in [employer] record.   Physical Exam: General: Well developed , white female in no acute distress Head: Normocephalic and atraumatic Eyes:  sclerae anicteric, conjunctiva pink  Ears: Normal auditory acuity Lungs: Clear throughout to auscultation Heart: Regular rate and rhythm Abdomen: Soft, non tender and non distended. No masses, no hepatomegaly. Normal bowel sounds Musculoskeletal: Symmetrical with no gross deformities  Extremities: No edema  Neurological: Alert oriented x 4, grossly nonfocal Psychological:  Alert and cooperative. Normal mood and affect  Assessment and Recommendations:  1. 4 month plus history of diarrhea which is unusual in this patient with chronic constipation. She has had antibiotics for UTI but diarrhea preceded that. Celiac studies negative July 2013. For further evaluation of bowel changes patient will be sheduled colonoscopy. he risks, benefits, and alternatives to colonoscopy with possible biopsy and  possible polypectomy were discussed with the patient and she consents to proceed.   2.  4 month history of nausea and vomiting. Normal gastric emptying study July 2013. She also complains of RUQ pain. Will check labs. She is post cholecystectomy. Doubt PUD. Maybe musculoskeletal pain? Further evaluation at time of EGD. See #3.   3. Recurrent solid fold dysphagia. Rule out recurrent stricture. Patient will be scheduled for EGD with probable dilation. The benefits, risks, and potential complications of EGD with possible biopsies and/or dilation were discussed with the patient and she agrees to proceed.

## 2013-01-19 NOTE — Patient Instructions (Addendum)
You have been scheduled for an endoscopy and colonoscopy with propofol. Please follow the written instructions given to you at your visit today. Please pick up your prep at the pharmacy within the next 1-3 days. If you use inhalers (even only as needed), please bring them with you on the day of your procedure. Your physician has requested that you go to the basement for the following lab work before leaving today: Increase your Prilosec to everyday 20-30 minutes prior to breakfast. CC:  Selena Nora MD

## 2013-01-23 ENCOUNTER — Telehealth: Payer: Self-pay | Admitting: Nurse Practitioner

## 2013-01-23 NOTE — Progress Notes (Signed)
Reviewed and agree with management. Yadira Hada D. Perlie Stene, M.D., FACG  

## 2013-01-23 NOTE — Telephone Encounter (Signed)
Pt calling for lab results. Please advise. 

## 2013-01-24 ENCOUNTER — Telehealth: Payer: Self-pay

## 2013-01-24 NOTE — Telephone Encounter (Signed)
Patient advised.

## 2013-01-24 NOTE — Telephone Encounter (Signed)
Already addressed. See comments on lab report

## 2013-01-24 NOTE — Telephone Encounter (Signed)
Pt left v/m pt had lab test at [facility] and liver results came back high; pt request Dr Ermalene Searing to review lab test to see if any other thoughts prior to testing(endoscopy and colonoscopy) on 02/07/13; pt still having nausea; every time she eats she feels nauseated and rt abd pain that hurts all the time. Pt wants Dr Daphine Deutscher opinion since she is still nauseated and see if Dr Ermalene Searing thinks any other testing needs to be done prior to 02/07/13.; pt has has nausea for approx 14 days.. Please advise.

## 2013-01-24 NOTE — Telephone Encounter (Signed)
Let pt know .Selena [name] Since she is s/p cholecystectomy ( gall bladder removal) there are no other tests I would recommend first. oi agree with GIs assessment and recommendations for eval.

## 2013-01-30 ENCOUNTER — Other Ambulatory Visit: Payer: Self-pay | Admitting: Family Medicine

## 2013-01-30 ENCOUNTER — Telehealth: Payer: Self-pay | Admitting: Gastroenterology

## 2013-01-30 DIAGNOSIS — R109 Unspecified abdominal pain: Secondary | ICD-10-CM

## 2013-01-30 NOTE — Telephone Encounter (Signed)
Pt is scheduled for endo colon next week. States that she has started to have pain in her lower back and pressure on her bladder. Pt is concerned and thinks she may have some sort of blockage. Pt states she is able to drink fluids and she did have a bowel movement yesterday. Reports that the stool was very thin and not like normal. Pt is worried and thinks that she may be getting worse. Pt wants to know if there is an xray or ultrasound or something we could order before her appt next week so she can know there is no blockage. Dr. Arlyce Dice please advise.

## 2013-01-30 NOTE — Telephone Encounter (Signed)
Doubt that she is obstructed. If she's developing pain with nausea and vomiting and does move her bowels then I would get a plain film of the abdomen

## 2013-01-30 NOTE — Telephone Encounter (Signed)
Electronic refill request.  Please advise. 

## 2013-01-31 ENCOUNTER — Ambulatory Visit (INDEPENDENT_AMBULATORY_CARE_PROVIDER_SITE_OTHER)
Admission: RE | Admit: 2013-01-31 | Discharge: 2013-01-31 | Disposition: A | Discharge status: Home / Self Care | Payer: BC Managed Care – PPO | Source: Ambulatory Visit | Attending: Gastroenterology | Admitting: Gastroenterology

## 2013-01-31 ENCOUNTER — Telehealth: Payer: Self-pay | Admitting: Gastroenterology

## 2013-01-31 DIAGNOSIS — R109 Unspecified abdominal pain: Secondary | ICD-10-CM

## 2013-01-31 NOTE — Telephone Encounter (Signed)
Pt aware and order in epic. Pt states she will come for x-ray.

## 2013-01-31 NOTE — Telephone Encounter (Signed)
Xray looks fine.  No obstruction

## 2013-01-31 NOTE — Telephone Encounter (Signed)
Pt is calling for her xray results, please advise.

## 2013-01-31 NOTE — Telephone Encounter (Signed)
Pt aware.

## 2013-02-07 ENCOUNTER — Other Ambulatory Visit: Payer: Self-pay | Admitting: Gastroenterology

## 2013-02-07 ENCOUNTER — Encounter: Payer: Self-pay | Admitting: Gastroenterology

## 2013-02-07 ENCOUNTER — Ambulatory Visit (AMBULATORY_SURGERY_CENTER): Payer: BC Managed Care – PPO | Admitting: Gastroenterology

## 2013-02-07 VITALS — BP 125/65 | HR 73 | Temp 98.8°F | Resp 18 | Ht 63.0 in | Wt 229.0 lb

## 2013-02-07 DIAGNOSIS — R110 Nausea: Secondary | ICD-10-CM

## 2013-02-07 DIAGNOSIS — D126 Benign neoplasm of colon, unspecified: Secondary | ICD-10-CM

## 2013-02-07 DIAGNOSIS — R1310 Dysphagia, unspecified: Secondary | ICD-10-CM

## 2013-02-07 DIAGNOSIS — R197 Diarrhea, unspecified: Secondary | ICD-10-CM

## 2013-02-07 LAB — GLUCOSE, CAPILLARY: Glucose-Capillary: 79 mg/dL (ref 70–99)

## 2013-02-07 MED ORDER — SODIUM CHLORIDE 0.9 % IV SOLN: 500.0000 mL | INTRAVENOUS | Status: DC

## 2013-02-07 NOTE — Op Note (Signed)
[facility] [address] [place], [address]   COLONOSCOPY PROCEDURE REPORT  PATIENT: Selena, [name]  MR#: [id number] BIRTHDATE: 1966-08-12 , 46  yrs. old GENDER: Female ENDOSCOPIST: Louis Meckel, MD REFERRED BY: Kerby Nora, MD PROCEDURE DATE:  02/07/2013 PROCEDURE:   Colonoscopy with biopsy and Colonoscopy with snare polypectomy ASA CLASS:   Class II INDICATIONS:Change in bowel habits. MEDICATIONS: MAC sedation, administered by CRNA and propofol (Diprivan) 400mg  IV  DESCRIPTION OF PROCEDURE:   After the risks benefits and alternatives of the procedure were thoroughly explained, informed consent was obtained.  A digital rectal exam revealed no abnormalities of the rectum.   The LB CF-[id number]  endoscope was introduced through the anus and advanced to the cecum, which was identified by both the appendix and ileocecal valve. No adverse events experienced.   The quality of the prep was excellent using Suprep  The instrument was then slowly withdrawn as the colon was fully examined.      COLON FINDINGS: A sessile polyp measuring 3 mm in size was found at the cecum.  A polypectomy was performed with a cold snare.  The resection was complete and the polyp tissue was completely retrieved.  The remainder the exam is normal. Random biopsies were taken throughout the colon to rule out microscopic colitis Retroflexed views revealed no abnormalities. The time to cecum=2 minutes 27 seconds.  Withdrawal time=10 minutes 0 seconds.  The scope was withdrawn and the procedure completed. COMPLICATIONS: There were no complications.  ENDOSCOPIC IMPRESSION: Sessile polyp measuring 3 mm in size was found at the cecum; polypectomy was performed with a cold snare  RECOMMENDATIONS: Await biopsy results   eSigned:  Louis Meckel, MD 02/07/2013 3:26 PM   cc:

## 2013-02-07 NOTE — Patient Instructions (Addendum)
YOU HAD AN ENDOSCOPIC PROCEDURE TODAY AT THE [facility]: Refer to the procedure report that was given to you for any specific questions about what was found during the examination.  If the procedure report does not answer your questions, please call your gastroenterologist to clarify.  If you requested that your care partner not be given the details of your procedure findings, then the procedure report has been included in a sealed envelope for you to review at your convenience later.  YOU SHOULD EXPECT: Some feelings of bloating in the abdomen. Passage of more gas than usual.  Walking can help get rid of the air that was put into your GI tract during the procedure and reduce the bloating. If you had a lower endoscopy (such as a colonoscopy or flexible sigmoidoscopy) you may notice spotting of blood in your stool or on the toilet paper. If you underwent a bowel prep for your procedure, then you may not have a normal bowel movement for a few days.  DIET: See dilation diet-  Drink plenty of fluids but you should avoid alcoholic beverages for 24 hours.  ACTIVITY: Your care partner should take you home directly after the procedure.  You should plan to take it easy, moving slowly for the rest of the day.  You can resume normal activity the day after the procedure however you should NOT DRIVE or use heavy machinery for 24 hours (because of the sedation medicines used during the test).    SYMPTOMS TO REPORT IMMEDIATELY: A gastroenterologist can be reached at any hour.  During normal business hours, 8:30 AM to 5:00 PM Monday through Friday, call [phone].  After hours and on weekends, please call the GI answering service at [phone] who will take a message and have the physician on call contact you.   Following lower endoscopy (colonoscopy or flexible sigmoidoscopy):  Excessive amounts of blood in the stool  Significant tenderness or worsening of abdominal pains  Swelling of the  abdomen that is new, acute  Fever of 100F or higher  Following upper endoscopy (EGD)  Vomiting of blood or coffee ground material  New chest pain or pain under the shoulder blades  Painful or persistently difficult swallowing  New shortness of breath  Fever of 100F or higher  Black, tarry-looking stools  FOLLOW UP: If any biopsies were taken you will be contacted by phone or by letter within the next 1-3 weeks.  Call your gastroenterologist if you have not heard about the biopsies in 3 weeks.  Our staff will call the home number listed on your records the next business day following your procedure to check on you and address any questions or concerns that you may have at that time regarding the information given to you following your procedure. This is a courtesy call and so if there is no answer at the home number and we have not heard from you through the emergency physician on call, we will assume that you have returned to your regular daily activities without incident.  SIGNATURES/CONFIDENTIALITY: You and/or your care partner have signed paperwork which will be entered into your electronic medical record.  These signatures attest to the fact that that the information above on your After Visit Summary has been reviewed and is understood.  Full responsibility of the confidentiality of this discharge information lies with you and/or your care-partner.  Polyps, stricture -handout given  Wait biopsy results   Office visit in 3-4 weeks, if office has not  called by 02/09/13 please call office to schedule appointment

## 2013-02-07 NOTE — Op Note (Signed)
[facility] [address] [place], [address]   ENDOSCOPY PROCEDURE REPORT  PATIENT: Selena [name], Selena [name]  MR#: [id number] BIRTHDATE: [birth year] , 46  yrs. old GENDER: Female ENDOSCOPIST: Louis Meckel, MD REFERRED BY:  Kerby Nora, MD PROCEDURE DATE:  02/07/2013 PROCEDURE:  EGD, diagnostic and Maloney dilation of esophagus ASA CLASS:     Class II INDICATIONS:  Dysphagia.   Nausea.   Vomiting. MEDICATIONS: There was residual sedation effect present from prior procedure, MAC sedation, administered by CRNA, and propofol (Diprivan) 150mg  IV TOPICAL ANESTHETIC:  DESCRIPTION OF PROCEDURE: After the risks benefits and alternatives of the procedure were thoroughly explained, informed consent was obtained.  The LB-GIF Q180 [id number] endoscope was introduced through the mouth and advanced to the third portion of the duodenum. Without limitations.  The instrument was slowly withdrawn as the mucosa was fully examined.      The upper, middle and distal third of the esophagus were carefully inspected and no abnormalities were noted.  The z-line was well seen at the GEJ.  The endoscope was pushed into the fundus which was normal including a retroflexed view.  The antrum, gastric body, first and second part of the duodenum were unremarkable. The scope was then withdrawn from the patient.  Because of complaints of dysphagia, a 52Fr maloney dilator was passed. Resistance was minimal.  There was no heme.  COMPLICATIONS: There were no complications.  ENDOSCOPIC IMPRESSION: Normal EGD - s/p maloney dilation RECOMMENDATIONS: OV 3-4 weeks REPEAT EXAM:  eSigned:  Louis Meckel, MD 02/07/2013 3:31 PM   CC:

## 2013-02-07 NOTE — Progress Notes (Signed)
Patient did not experience any of the following events: a burn prior to discharge; a fall within the facility; wrong site/side/patient/procedure/implant event; or a hospital transfer or hospital admission upon discharge from the facility. (G8907) Patient did not have preoperative order for IV antibiotic SSI prophylaxis. (G8918)  

## 2013-02-08 ENCOUNTER — Encounter: Payer: Self-pay | Admitting: *Deleted

## 2013-02-08 ENCOUNTER — Telehealth: Payer: Self-pay | Admitting: *Deleted

## 2013-02-08 NOTE — Telephone Encounter (Signed)
Error, no encounter.

## 2013-02-08 NOTE — Telephone Encounter (Signed)
Number identifier, left message, follow-up  

## 2013-02-10 ENCOUNTER — Ambulatory Visit: Payer: BC Managed Care – PPO | Admitting: Internal Medicine

## 2013-02-10 ENCOUNTER — Other Ambulatory Visit: Payer: Self-pay | Admitting: Family Medicine

## 2013-02-10 NOTE — Telephone Encounter (Signed)
Patient advised and also need refill on clonazepam

## 2013-02-10 NOTE — Telephone Encounter (Signed)
Let pt know she has used 20 maxalt since I believe 09/2012 (or 11/2012), if headaches remain very frequent ... Make appt to be seen.

## 2013-02-13 ENCOUNTER — Encounter: Payer: Self-pay | Admitting: Gastroenterology

## 2013-02-13 ENCOUNTER — Telehealth: Payer: Self-pay | Admitting: Gastroenterology

## 2013-02-13 NOTE — Telephone Encounter (Signed)
Small adenomatous polyp removed from the cecum. Random biopsies were negative for any type of colitis

## 2013-02-13 NOTE — Telephone Encounter (Signed)
Pt calling for path report. Please advise. 

## 2013-02-13 NOTE — Telephone Encounter (Signed)
Pt aware.

## 2013-02-14 MED ORDER — CLONAZEPAM 1 MG PO TABS: ORAL_TABLET | ORAL | Status: DC

## 2013-02-15 ENCOUNTER — Telehealth: Payer: Self-pay | Admitting: Gastroenterology

## 2013-02-15 DIAGNOSIS — R110 Nausea: Secondary | ICD-10-CM

## 2013-02-15 DIAGNOSIS — R1011 Right upper quadrant pain: Secondary | ICD-10-CM

## 2013-02-15 MED ORDER — HYOSCYAMINE SULFATE ER 0.375 MG PO TBCR
EXTENDED_RELEASE_TABLET | ORAL | Status: DC
Start: 2013-02-15 — End: 2015-04-25

## 2013-02-15 NOTE — Telephone Encounter (Signed)
Begin hyomax 0.375 mg twice a day for 4-5 days then as needed Please schedule a abdominal ultrasound Repeat LFTs  and get lipase and amylase

## 2013-02-15 NOTE — Telephone Encounter (Signed)
Pt seen on 01/19/13 for Nausea on r side pain. She had ECL on 02/07/13 with dilation of stricture and an adenomatous polypectomy. Pt reports the nausea is no better and now her pain is spreading from the r side to her belly button. She has been taking Zofran with a little relief. Please advise. Thanks.

## 2013-02-15 NOTE — Telephone Encounter (Signed)
Informed pt of Dr Marzetta Board orders. She will have her U/S on 02/17/13 and probably do labs after that; med has been ordered. Pt stated understanding.

## 2013-02-17 ENCOUNTER — Other Ambulatory Visit (INDEPENDENT_AMBULATORY_CARE_PROVIDER_SITE_OTHER): Payer: BC Managed Care – PPO

## 2013-02-17 ENCOUNTER — Ambulatory Visit (HOSPITAL_COMMUNITY)
Admission: RE | Admit: 2013-02-17 | Discharge: 2013-02-17 | Disposition: A | Discharge status: Home / Self Care | Payer: BC Managed Care – PPO | Source: Ambulatory Visit | Attending: Gastroenterology | Admitting: Gastroenterology

## 2013-02-17 ENCOUNTER — Telehealth: Payer: Self-pay | Admitting: Gastroenterology

## 2013-02-17 DIAGNOSIS — Z9089 Acquired absence of other organs: Secondary | ICD-10-CM | POA: Insufficient documentation

## 2013-02-17 DIAGNOSIS — R110 Nausea: Secondary | ICD-10-CM

## 2013-02-17 DIAGNOSIS — R1011 Right upper quadrant pain: Secondary | ICD-10-CM | POA: Insufficient documentation

## 2013-02-17 LAB — HEPATIC FUNCTION PANEL
ALT: 22 U/L (ref 0–35)
AST: 18 U/L (ref 0–37)
Albumin: 3.9 g/dL (ref 3.5–5.2)
Alkaline Phosphatase: 50 U/L (ref 39–117)
Bilirubin, Direct: 0.1 mg/dL (ref 0.0–0.3)
Total Bilirubin: 0.5 mg/dL (ref 0.3–1.2)
Total Protein: 7 g/dL (ref 6.0–8.3)

## 2013-02-17 LAB — LIPASE: Lipase: 17 U/L (ref 11.0–59.0)

## 2013-02-17 LAB — AMYLASE: Amylase: 41 U/L (ref 27–131)

## 2013-02-17 NOTE — Telephone Encounter (Signed)
Spoke with pt and she is aware of results per Dr. Arlyce Dice. See result note.

## 2013-02-17 NOTE — Progress Notes (Signed)
Quick Note:  Please inform the patient that ultrasound was normal and to continue current plan of action ______ 

## 2013-02-20 ENCOUNTER — Telehealth: Payer: Self-pay | Admitting: Gastroenterology

## 2013-02-20 NOTE — Telephone Encounter (Signed)
Pt calling to get lab results. Please advise.

## 2013-02-21 ENCOUNTER — Telehealth: Payer: Self-pay

## 2013-02-21 NOTE — Telephone Encounter (Signed)
Adenomatous polyp. Blood work normal. Needs office visit

## 2013-02-21 NOTE — Telephone Encounter (Signed)
Pt left v/m; pt has been treated by Dr Ermalene Searing and [facility] re; stomach problems. Pt has done research and elevated ALT can be caused from statins and statins can cause stomach problems. Pt takes Simvastatin; pt's mother and pt's grandfather could not tolerate statins. Pt wants to know if pt could try a different med instead of Simvastatin.[facility].Please advise.

## 2013-02-21 NOTE — Telephone Encounter (Signed)
Left message for pt to call back  °

## 2013-02-21 NOTE — Telephone Encounter (Signed)
We absolutely can try something else.Marland Kitchen Let her know though that her recetn LFTs were normal and we usually only are concerned about LFT elevations from statins if they are 3 times normal ( hers was far from that) I do understand that she is concerned it may be irritating her stomach.. Have her look into welchol to see if this is affordable for her. If so would she prefer capsules several a day vs powder mixed in drink.

## 2013-02-22 NOTE — Telephone Encounter (Signed)
Left message for pt to call back  °

## 2013-02-22 NOTE — Telephone Encounter (Signed)
Patient advised and will call insurance and pharmacy to check on cost and will call back

## 2013-02-23 NOTE — Telephone Encounter (Signed)
Left message for pt to call back.  Unable to reach pt after multiple attempts. Pt has OV scheduled already and appt letter has been sent to the pt.

## 2013-02-24 ENCOUNTER — Telehealth: Payer: Self-pay | Admitting: Gastroenterology

## 2013-02-24 NOTE — Telephone Encounter (Signed)
Spoke with patient and gave her results per 02/20/13 telephone note.

## 2013-02-28 ENCOUNTER — Encounter: Payer: Self-pay | Admitting: Gastroenterology

## 2013-02-28 ENCOUNTER — Ambulatory Visit (INDEPENDENT_AMBULATORY_CARE_PROVIDER_SITE_OTHER): Payer: BC Managed Care – PPO | Admitting: Gastroenterology

## 2013-02-28 VITALS — BP 116/86 | HR 96 | Ht 62.25 in | Wt 226.1 lb

## 2013-02-28 DIAGNOSIS — R110 Nausea: Secondary | ICD-10-CM

## 2013-02-28 DIAGNOSIS — R1310 Dysphagia, unspecified: Secondary | ICD-10-CM

## 2013-02-28 NOTE — Assessment & Plan Note (Signed)
Nausea and abdominal pain are significantly improved since discontinuing Zocor. Symptoms are probably do to a medication effect.

## 2013-02-28 NOTE — Patient Instructions (Addendum)
Follow up as needed

## 2013-02-28 NOTE — Assessment & Plan Note (Signed)
Improved following esophageal dilatation although no stricture was seen. Patient does complain of a globus sensation.

## 2013-02-28 NOTE — Progress Notes (Signed)
History of Present Illness:  The patient has returned following upper and lower endoscopy. She underwent dilatation because of complaints of dysphagia although no stricture was seen. She reports improvement in dysphagia. She still has a globus-type sensation. Random biopsies of the colon were negative. An adenomatous polyp was removed. Since discontinuing Zocor symptoms including pain and nausea have significantly improved. She's been taking Prozac since March.  Her symptoms have been at least 6 months old. She rarely takes NSAIDs.   Review of Systems: Pertinent positive and negative review of systems were noted in the above HPI section. All other review of systems were otherwise negative.    Current Medications, Allergies, Past Medical History, Past Surgical History, Family History and Social History were reviewed in [facility] electronic medical record  Vital signs were reviewed in today's medical record. Physical Exam: General: Well developed , well nourished, no acute distress

## 2013-03-08 ENCOUNTER — Encounter: Payer: Self-pay | Admitting: Internal Medicine

## 2013-03-08 ENCOUNTER — Ambulatory Visit (INDEPENDENT_AMBULATORY_CARE_PROVIDER_SITE_OTHER): Payer: BC Managed Care – PPO | Admitting: Internal Medicine

## 2013-03-08 VITALS — BP 126/78 | HR 89 | Temp 98.3°F | Resp 16 | Ht 62.0 in | Wt 226.2 lb

## 2013-03-08 DIAGNOSIS — R0683 Snoring: Secondary | ICD-10-CM

## 2013-03-08 DIAGNOSIS — G4710 Hypersomnia, unspecified: Secondary | ICD-10-CM

## 2013-03-08 DIAGNOSIS — R0609 Other forms of dyspnea: Secondary | ICD-10-CM

## 2013-03-08 DIAGNOSIS — R400 Somnolence: Secondary | ICD-10-CM

## 2013-03-08 DIAGNOSIS — E669 Obesity, unspecified: Secondary | ICD-10-CM

## 2013-03-08 DIAGNOSIS — E785 Hyperlipidemia, unspecified: Secondary | ICD-10-CM

## 2013-03-08 DIAGNOSIS — R0989 Other specified symptoms and signs involving the circulatory and respiratory systems: Secondary | ICD-10-CM

## 2013-03-08 DIAGNOSIS — I10 Essential (primary) hypertension: Secondary | ICD-10-CM

## 2013-03-08 MED ORDER — TOPIRAMATE 25 MG PO TABS: 25.0000 mg | ORAL_TABLET | Freq: Two times a day (BID) | ORAL | Status: DC

## 2013-03-08 MED ORDER — MELOXICAM 15 MG PO TABS: 15.0000 mg | ORAL_TABLET | Freq: Every day | ORAL | Status: DC

## 2013-03-08 NOTE — Progress Notes (Signed)
Patient ID: Selena [name], female   DOB: 01-22-1966, 47 y.o.   MRN: [id number]  Patient Active Problem List   Diagnosis Date Noted  . Dysphagia, unspecified(787.20) 01/19/2013  . Nausea alone 01/19/2013  . Diarrhea 01/02/2013  . Eustachian tube dysfunction 12/16/2012  . Lumbosacral strain 10/07/2012  . Bronchitis, acute 06/22/2012  . Fatty liver 08/20/2011  . RUQ abdominal pain 08/18/2011  . Allergic rhinitis 04/24/2011  . PARESTHESIA, HANDS 11/18/2010  . DYSPHAGIA PHARYNGEAL PHASE 11/18/2010  . 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 HEAT INTOLERANCE 06/17/2010  . DEPRESSION/ANXIETY 04/09/2010  . KNEE PAIN, BILATERAL 04/01/2010  . ACNE VULGARIS 01/30/2009  . PREDIABETES 10/24/2008  . OBESITY, UNSPECIFIED 10/12/2008  . HYPERLIPIDEMIA 05/30/2008  . ANEMIA-IRON DEFICIENCY 05/30/2008  . COMMON MIGRAINE 05/30/2008  . HYPERTENSION 05/30/2008  . ASTHMA, EXERCISE INDUCED 05/30/2008  . PREMENSTRUAL DYSPHORIC SYNDROME 05/30/2008  . FIBROMYALGIA 05/30/2008  . CARDIAC MURMUR 05/30/2008    Subjective:  CC:   Chief Complaint  Patient presents with  . Establish Care    HPI:   Selena [name] a 47 y.o. female who presents New patient.   Referred by her sister Selena [name]  Cc wt gain of 100 lbs over 8 yrs,  Since move from [place] to [place] and meeting new husband .  Binge eater,  Has tried all programs including a 3 yr program in [place] with counsellor and support group,  Lost all the wt during her divorce ,  Was doing Taibo and not eating.  Complicated by fibromyalgia.  Also had a stomach issue for the last 9 months  with persistent  Diarrhea, nausea and anorexia,  Initially attribted to norovirus, but persisted.  Esophageal stricture requiring annual stretching requiring regurgitation. Colon polyp by colonoscopy.  And a medication for chronic  Constipation.   Ultrasound for elevated ALT negative for fatty liver   Saw Bedsole at [facility] previously.   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 Had a personal trainer at the [facility] and ended up up with bursitis requiring steroid shots .  Thinks her anxiety prevents her from losing weight .  Prior evaluation by psychologist   Ermalene Searing has changed therapy from effexor to prozac with no significant improvement 2 months ago    Past Medical History  Diagnosis Date  . Anemia, iron deficiency   . Depression   . Hyperlipidemia   . Hypertension   . Anxiety   . Gallstones   . Fibromyalgia   . Asthma   . Eating disorder     Binge eating  . Colon polyps 2014    Past Surgical History  Procedure Laterality Date  . 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 Tonsillectomy  1989  . Cholecystectomy    . Ganglion cyst excision         The following portions of the patient's history were reviewed and updated as appropriate: Allergies, current medications, and problem list.    Review of Systems:   12 Pt  review of systems was negative except those addressed in the HPI,     History   Social History  . Marital Status: Married    Spouse  Name: N/A    Number of Children: 1  . Years of Education: N/A   Occupational History  . Teaches preschool    Social History Main Topics  . Smoking status: Never Smoker   . Smokeless tobacco: Never Used  . Alcohol Use: Yes     Comment: 4 Times a month  . Drug Use: No  . Sexually Active: Not on file   Other Topics Concern  . Not on file   Social History Narrative   2 Step children; asthma, ADHD    Objective:  BP 126/78  Pulse 89  Temp(Src) 98.3 F (36.8 C) (Oral)  Resp 16  Ht 5\' 2"  (1.575 m)  Wt 226 lb 4 oz (102.626 kg)  BMI 41.37 kg/m2  SpO2 98%  General appearance: alert, cooperative and  appears stated age Ears: normal TM's and external ear canals both ears Throat: lips, mucosa, and tongue normal; teeth and gums normal Neck: no adenopathy, no carotid bruit, supple, symmetrical, trachea midline and thyroid not enlarged, symmetric, no tenderness/mass/nodules Back: symmetric, no curvature. ROM normal. No CVA tenderness. Lungs: clear to auscultation bilaterally Heart: regular rate and rhythm, S1, S2 normal, no murmur, click, rub or gallop Abdomen: soft, non-tender; bowel sounds normal; no masses,  no organomegaly Pulses: 2+ and symmetric Skin: Skin color, texture, turgor normal. No rashes or lesions Lymph nodes: Cervical, supraclavicular, and axillary nodes normal.  Assessment and Plan:  HYPERTENSION Well controlled on current regimen. Renal function stable, no changes today.  HYPERLIPIDEMIA Not tolerating medications  due to aggravation of muscle pain .  Discussed suspending statin and repeating fasting lipinds in 6 weeks after low glycemic index diet.  OBESITY, UNSPECIFIED Her BMI remains unchanged despite exercise. We discussed the nature and quality of her exercises well as of her diet. Usually what I find is that people are not exercising as vigorously as they should to achieve a sustained heart rate in the aerobic zone. I suggested that she consider joining a gym and using a personal trainer to help guide her efforts.   I also am advising her to get back on the low GI diet using six smaller meals a day to stimulate her metabolism.    Snoring disorder With obesity, daytime fatigue and hypertension.  Sleep study ordered.    Updated Medication List Outpatient Encounter Prescriptions as of 03/08/2013  Medication Sig Dispense Refill  . cetirizine (ZYRTEC) 10 MG tablet Take 10 mg by mouth daily.        . clonazePAM (KLONOPIN) 1 MG tablet TAKE 1 TABLET BY MOUTH AT BEDTIME  30 tablet  0  . FLUoxetine (PROZAC) 20 MG tablet Take 1 tablet (20 mg total) by mouth daily.  30  tablet  3  . fluticasone (FLONASE) 50 MCG/ACT nasal spray Place 2 sprays into the nose daily.  16 g  6  . omeprazole (PRILOSEC OTC) 20 MG tablet Take 1 tablet (20 mg total) by mouth daily.  30 tablet  11  . rizatriptan (MAXALT) 10 MG tablet TAKE 1 TABLET (10 MG TOTAL) BY MOUTH AS NEEDED FOR MIGRAINE. MAY REPEAT IN 2 HOURS IF NEEDED  10 tablet  0  . diclofenac (VOLTAREN) 75 MG EC tablet Take 1 tablet (75 mg total) by mouth 2 (two) times daily.  30 tablet  0  . Hyoscyamine Sulfate 0.375 MG TBCR Take one tablet by mouth twice daily for 4--5 days and then as needed  25 tablet  1  . meloxicam (MOBIC) 15 MG tablet Take  1 tablet (15 mg total) by mouth daily.  30 tablet  1  . topiramate (TOPAMAX) 25 MG tablet Take 1 tablet (25 mg total) by mouth 2 (two) times daily.  60 tablet  3   No facility-administered encounter medications on file as of 03/08/2013.

## 2013-03-08 NOTE — Patient Instructions (Addendum)
Stay off the cholesterol medication   Resume meloxicam as a trial  Trial of topiramate for appetite suppression   Start low at 25 mg once or twice daily  Sleep study to rule out sleep apnea  I will find the name of the PT who is also a trainer.   Return in one month   This is  One version of a  "Low GI"  Diet:  It will still lower your blood sugars and allow you to lose 4 to 8  lbs  per month if you follow it carefully.  Your goal with exercise is a minimum of 30 minutes of aerobic exercise 5 days per week (Walking does not count once it becomes easy!)    All of the foods can be found at grocery stores and in bulk at [employer].  The [name] protein bars and shakes are available in more varieties at Target, WalMart and [employer].     7 AM Breakfast:  Choose from the following:  Low carbohydrate Protein  Shakes (I recommend the [name] shakes  Or the low carb shakes by [name].    2.5 carbs   [name] "Sandwhich Thin"toasted  w/ peanut butter (no jelly: about 20 net carbs  "[name]" with cream cheese and salmon: about 20 carbs   a scrambled egg/bacon/cheese burrito made with [name] "carb balance" whole wheat tortilla  (about 10 net carbs )   Avoid cereal and bananas, oatmeal and cream of wheat and grits. They are loaded with carbohydrates!   10 AM: high protein snack  Protein bar by [name] (the snack size, under 200 cal, usually < 6 net carbs).    A stick of cheese:  Around 1 carb,  100 cal     Dannon Light n Fit [place] Yogurt  (80 cal, 8 carbs)  Other so called "protein bars" and Greek yogurts tend to be loaded with carbohydrates.  Remember, in food advertising, the word "energy" is synonymous for " carbohydrate."  Lunch:   A Sandwich using the bread choices listed, Can use any  Eggs,  lunchmeat, grilled meat or canned tuna), avocado, regular mayo/mustard  and cheese.  A Salad using blue cheese, ranch,  Goddess or vinagrette,  No croutons or "confetti" and  no "candied nuts" but regular nuts OK.   No pretzels or chips.  Pickles and miniature sweet peppers are a good low carb alternative that provide a "crunch"  The bread is the only source of carbohydrate in a sandwich and  can be decreased by trying some of these alternatives to traditional loaf bread  [name] that are 50 cal and 4 net carbs available at [name] and WalMart.  This can be toasted to use with hummous as well  [name] makes a low carb flatbread that's 100 cal and 9 net carbs available at [employer] and [employer] makes 2 sizes of  [name]  (The large one is 210 cal and 6 net carbs) Avoid "Low fat dressings, as well as Reyne Dumas and [address] dressings They are loaded with sugar!   3 PM/ Mid day  Snack:  Consider  1 ounce of  almonds, walnuts, pistachios, pecans, peanuts,  Macadamia nuts or a nut medley.  Avoid "granola"; the dried cranberries and raisins are loaded with carbohydrates. Mixed nuts as long as there are no raisins,  cranberries or dried fruit.     6  PM  Dinner:     Meat/fowl/fish with a green salad, and either broccoli, cauliflower, green beans, spinach, brussel sprouts or  Lima beans. DO NOT BREAD THE PROTEIN!!      There is a low carb pasta by [name]: only 5 digestible carbs/serving.( All grocery stores but [name] carry it )  Try Kai Levins Angelo's chicken piccata or chicken or eggplant parm over low carb pasta.([name] and [name])   Clifton Custard Sanchez's "Carnitas" (pulled pork, no sauce,  0 carbs) or his beef pot roast to make a dinner burrito (at [name])  Pesto over low carb pasta (bj's sells a good quality pesto in the center refrigerated section of the deli   Whole wheat pasta is still full of digestible carbs and  Not as low in glycemic index as [name].   Brown rice is still rice,  So skip the rice and noodles if you eat [place] or [place] (or at least limit to 1/2  cup)  9 PM snack :   [name] "low carb" fudgsicle or  ice cream bar (Carb Smart line), or  Weight Watcher's ice cream bar , or another "no sugar added" ice cream;  a serving of fresh berries/cherries with whipped cream   Cheese or [name]  Avoid bananas, pineapple, grapes  and watermelon on a regular basis because they are high in sugar.  THINK OF THEM AS DESSERT  Remember that snack Substitutions should be less than 10 NET carbs per serving and meals < 20 carbs. Remember to subtract fiber grams to get the "net carbs."

## 2013-03-11 ENCOUNTER — Encounter: Payer: Self-pay | Admitting: Internal Medicine

## 2013-03-11 DIAGNOSIS — R0683 Snoring: Secondary | ICD-10-CM | POA: Insufficient documentation

## 2013-03-11 NOTE — Assessment & Plan Note (Signed)
Not tolerating medications  due to aggravation of muscle pain .  Discussed suspending statin and repeating fasting lipinds in 6 weeks after low glycemic index diet.

## 2013-03-11 NOTE — Assessment & Plan Note (Signed)
With obesity, daytime fatigue and hypertension.  Sleep study ordered.

## 2013-03-11 NOTE — Assessment & Plan Note (Signed)
Well controlled on current regimen. Renal function stable, no changes today. 

## 2013-03-11 NOTE — Assessment & Plan Note (Signed)
Her BMI remains unchanged despite exercise. We discussed the nature and quality of her exercises well as of her diet. Usually what I find is that people are not exercising as vigorously as they should to achieve a sustained heart rate in the aerobic zone. I suggested that she consider joining a gym and using a personal trainer to help guide her efforts.   I also am advising her to get back on the low GI diet using six smaller meals a day to stimulate her metabolism.   

## 2013-03-14 ENCOUNTER — Telehealth: Payer: Self-pay | Admitting: *Deleted

## 2013-03-14 NOTE — Telephone Encounter (Signed)
Yes take one tablet daily at bedtime.  Increase dose to 2 tablets at bedtime in one week of tolerated

## 2013-03-14 NOTE — Telephone Encounter (Signed)
Patient notified as instructed. 

## 2013-03-14 NOTE — Telephone Encounter (Signed)
Patient states new medication making her to drowsy at school , Topamax taking BID patient would like to know should she cut dose to once daily due to drowsiness.

## 2013-03-22 ENCOUNTER — Telehealth: Payer: Self-pay | Admitting: *Deleted

## 2013-03-22 MED ORDER — CLONAZEPAM 1 MG PO TABS: ORAL_TABLET | ORAL | Status: DC

## 2013-03-22 NOTE — Telephone Encounter (Signed)
Patient would like to know if you would like her to continue with the Klonopin since starting the Topamax if you would like her to continue patient needs script for Klonopin please advise.

## 2013-03-22 NOTE — Telephone Encounter (Signed)
They are not for the same condition, so ok to Refill the clonazepam.  Ok to call in per chart updated

## 2013-03-23 NOTE — Telephone Encounter (Signed)
Pt notified. Rx for Clonazepam called to [facility].

## 2013-03-27 ENCOUNTER — Telehealth: Payer: Self-pay | Admitting: Internal Medicine

## 2013-03-27 DIAGNOSIS — R0683 Snoring: Secondary | ICD-10-CM

## 2013-03-27 DIAGNOSIS — G2581 Restless legs syndrome: Secondary | ICD-10-CM

## 2013-03-27 NOTE — Telephone Encounter (Signed)
Patient notified and lab appointment scheduled. 

## 2013-03-27 NOTE — Telephone Encounter (Signed)
Sleep study did not find sleep apnea as a cause for her daytime fatigue, but did note lots of "periodic limb movements" (legs moving around at lot) and recommended evaluation for restless legs .Marland Kitchen  We can do this at next visi but if she wants to have her iron and thyroid levels checked, she can make lab appt.  .  In the meantime I recommend she keep a daily diarrhea of her sleep problems to see if they correlate with what tine she goes to bed,  What she did or ate the evening prior, and how she felt in the morning.

## 2013-03-30 ENCOUNTER — Other Ambulatory Visit (INDEPENDENT_AMBULATORY_CARE_PROVIDER_SITE_OTHER): Payer: BC Managed Care – PPO

## 2013-03-30 ENCOUNTER — Telehealth: Payer: Self-pay | Admitting: *Deleted

## 2013-03-30 DIAGNOSIS — E559 Vitamin D deficiency, unspecified: Secondary | ICD-10-CM

## 2013-03-30 DIAGNOSIS — G2581 Restless legs syndrome: Secondary | ICD-10-CM

## 2013-03-30 LAB — CBC WITH DIFFERENTIAL/PLATELET
Basophils Absolute: 0.1 10*3/uL (ref 0.0–0.1)
Basophils Relative: 0.7 % (ref 0.0–3.0)
Eosinophils Absolute: 0.1 10*3/uL (ref 0.0–0.7)
Eosinophils Relative: 0.9 % (ref 0.0–5.0)
HCT: 40.6 % (ref 36.0–46.0)
Hemoglobin: 13.9 g/dL (ref 12.0–15.0)
Lymphocytes Relative: 32 % (ref 12.0–46.0)
Lymphs Abs: 2.6 10*3/uL (ref 0.7–4.0)
MCHC: 34.3 g/dL (ref 30.0–36.0)
MCV: 92.2 fl (ref 78.0–100.0)
Monocytes Absolute: 0.5 10*3/uL (ref 0.1–1.0)
Monocytes Relative: 6.5 % (ref 3.0–12.0)
Neutro Abs: 4.8 10*3/uL (ref 1.4–7.7)
Neutrophils Relative %: 59.9 % (ref 43.0–77.0)
Platelets: 311 10*3/uL (ref 150.0–400.0)
RBC: 4.41 Mil/uL (ref 3.87–5.11)
RDW: 12.9 % (ref 11.5–14.6)
WBC: 8.1 10*3/uL (ref 4.5–10.5)

## 2013-03-30 LAB — FERRITIN: Ferritin: 64.4 ng/mL (ref 10.0–291.0)

## 2013-03-30 NOTE — Telephone Encounter (Signed)
Pt would like to add a Vitamin D?

## 2013-03-31 LAB — IRON AND TIBC
%SAT: 21 % (ref 20–55)
Iron: 66 ug/dL (ref 42–145)
TIBC: 311 ug/dL (ref 250–470)
UIBC: 245 ug/dL (ref 125–400)

## 2013-04-01 LAB — TSH+FREE T4
Free T4: 0.91 ng/dL (ref 0.82–1.77)
TSH: 1.69 u[IU]/mL (ref 0.450–4.500)

## 2013-04-01 LAB — VITAMIN D 25 HYDROXY (VIT D DEFICIENCY, FRACTURES): Vit D, 25-Hydroxy: 35 ng/mL (ref 30–89)

## 2013-04-03 ENCOUNTER — Encounter: Payer: Self-pay | Admitting: *Deleted

## 2013-04-05 ENCOUNTER — Ambulatory Visit (INDEPENDENT_AMBULATORY_CARE_PROVIDER_SITE_OTHER): Payer: BC Managed Care – PPO | Admitting: Internal Medicine

## 2013-04-05 ENCOUNTER — Encounter: Payer: Self-pay | Admitting: Internal Medicine

## 2013-04-05 VITALS — BP 110/74 | HR 86 | Temp 98.3°F | Resp 16 | Wt 223.2 lb

## 2013-04-05 DIAGNOSIS — G4763 Sleep related bruxism: Secondary | ICD-10-CM

## 2013-04-05 DIAGNOSIS — R0989 Other specified symptoms and signs involving the circulatory and respiratory systems: Secondary | ICD-10-CM

## 2013-04-05 DIAGNOSIS — K760 Fatty (change of) liver, not elsewhere classified: Secondary | ICD-10-CM

## 2013-04-05 DIAGNOSIS — I10 Essential (primary) hypertension: Secondary | ICD-10-CM

## 2013-04-05 DIAGNOSIS — E785 Hyperlipidemia, unspecified: Secondary | ICD-10-CM

## 2013-04-05 DIAGNOSIS — R0609 Other forms of dyspnea: Secondary | ICD-10-CM

## 2013-04-05 DIAGNOSIS — R51 Headache: Secondary | ICD-10-CM

## 2013-04-05 DIAGNOSIS — K7689 Other specified diseases of liver: Secondary | ICD-10-CM

## 2013-04-05 DIAGNOSIS — E669 Obesity, unspecified: Secondary | ICD-10-CM

## 2013-04-05 DIAGNOSIS — G43009 Migraine without aura, not intractable, without status migrainosus: Secondary | ICD-10-CM

## 2013-04-05 DIAGNOSIS — R0683 Snoring: Secondary | ICD-10-CM

## 2013-04-05 LAB — LIPID PANEL
Cholesterol: 250 mg/dL — ABNORMAL HIGH (ref 0–200)
HDL: 40.8 mg/dL (ref >39.00)
Total CHOL/HDL Ratio: 6
Triglycerides: 186 mg/dL — ABNORMAL HIGH (ref 0.0–149.0)
VLDL: 37.2 mg/dL (ref 0.0–40.0)

## 2013-04-05 LAB — LDL CHOLESTEROL, DIRECT: Direct LDL: 180.1 mg/dL

## 2013-04-05 MED ORDER — DICLOFENAC SODIUM 75 MG PO TBEC: 75.0000 mg | DELAYED_RELEASE_TABLET | Freq: Two times a day (BID) | ORAL | Status: DC

## 2013-04-05 MED ORDER — TIZANIDINE HCL 4 MG PO TABS: 4.0000 mg | ORAL_TABLET | Freq: Four times a day (QID) | ORAL | Status: DC | PRN

## 2013-04-05 NOTE — Progress Notes (Signed)
Patient ID: Selena [name], female   DOB: 1965/11/21, 47 y.o.   MRN: [id number]  Patient Active Problem List   Diagnosis Date Noted  . 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 Snoring disorder 03/11/2013  . Dysphagia, unspecified(787.20) 01/19/2013  . Nausea alone 01/19/2013  . Diarrhea 01/02/2013  . Eustachian tube dysfunction 12/16/2012  . Lumbosacral strain 10/07/2012  . Bronchitis, acute 06/22/2012  . Fatty liver 08/20/2011  . RUQ abdominal pain 08/18/2011  . Allergic rhinitis 04/24/2011  . PARESTHESIA, HANDS 11/18/2010  . DYSPHAGIA PHARYNGEAL PHASE 11/18/2010  . 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 HEAT INTOLERANCE 06/17/2010  . DEPRESSION/ANXIETY 04/09/2010  . KNEE PAIN, BILATERAL 04/01/2010  . ACNE VULGARIS 01/30/2009  . PREDIABETES 10/24/2008  . OBESITY, UNSPECIFIED 10/12/2008  . HYPERLIPIDEMIA 05/30/2008  . ANEMIA-IRON DEFICIENCY 05/30/2008  . COMMON MIGRAINE 05/30/2008  . HYPERTENSION 05/30/2008  . ASTHMA, EXERCISE INDUCED 05/30/2008  . PREMENSTRUAL DYSPHORIC SYNDROME 05/30/2008  . FIBROMYALGIA 05/30/2008  . CARDIAC MURMUR 05/30/2008    Subjective:  CC:   Chief Complaint  Patient presents with  . Follow-up    fasting labs    HPI:   Tremaine Fuhriman a 47 y.o. female who presents for follow up on multiple medical issues including fatigue, snoring and recurrent migraine headaches.  Was referred for a sleep study,  And topomax prescribed since last visit one month ago.  She underwent a sleep study recently which showed no evidence of sleep apnea but suggested that her poor sleep was due to RLS.  She has run of of her maxalt early due to persistent headaches accompanied by bruxism on right side which has started to cause jaw and shoulder pain.    Prior use of zanaflex which was prescribed for shoulder pain helped   Going back to the gym and doing spin classes twice weekly.     Past Medical History  Diagnosis Date  . Anemia, iron deficiency   . Depression    . Hyperlipidemia   . Hypertension   . Anxiety   . Gallstones   . Fibromyalgia   . Asthma   . Eating disorder     Binge eating  . Colon polyps 2014    Past Surgical History  Procedure Laterality Date  . 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 Tonsillectomy  1989  . Cholecystectomy    . Ganglion cyst excision         The following portions of the patient's history were reviewed and updated as appropriate: Allergies, current medications, and problem list.    Review of Systems:   12 Pt  review of systems was negative except those addressed in the HPI,     History   Social History  . Marital Status: Married    Spouse Name: N/A    Number of Children: 1  . Years of Education: N/A   Occupational History  . Teaches preschool    Social History Main Topics  . Smoking status: Never Smoker   . Smokeless tobacco: Never Used  . Alcohol Use: Yes     Comment: 4 Times a month  . Drug Use: No  . Sexually Active: Not on file   Other Topics Concern  . Not on file   Social History Narrative   2 Step children; asthma, ADHD    Objective:  BP 110/74  Pulse 86  Temp(Src) 98.3 F (36.8 C) (Oral)  Resp 16  Wt 223 lb 4 oz (101.266 kg)  BMI 40.82 kg/m2  SpO2  98%  General appearance: alert, cooperative and appears stated age Ears: normal TM's and external ear canals both ears Throat: lips, mucosa, and tongue normal; teeth and gums normal Neck: no adenopathy, no carotid bruit, supple, symmetrical, trachea midline and thyroid not enlarged, symmetric, no tenderness/mass/nodules Back: symmetric, no curvature. ROM normal. No CVA tenderness. Lungs: clear to auscultation bilaterally Heart: regular rate and rhythm, S1, S2 normal, no murmur, click, rub or gallop Abdomen: soft, non-tender; bowel sounds normal; no masses,  no organomegaly Pulses: 2+ and symmetric Skin: Skin color, texture, turgor normal. No rashes or lesions Lymph nodes: Cervical, supraclavicular,  and axillary nodes normal.  Assessment and Plan:  OBESITY, UNSPECIFIED Wt loss of 17 lbs since March,  6 in the last 2 months. With increased attention to diet and exercise.  Encouragement and direction given.   COMMON MIGRAINE topiriamate started.  Samples of relpax given sinc eseh ran out of maxalt early.    HYPERLIPIDEMIA Without statin her total chol is 250,  LDL 180 and trigs 186.  DL has also dropped to 40.  Will recommend low GI diet and repeat lipids  in 3 months.   Fatty liver Liver enzymes have been normal .  continue weight loss and control of all metabolic issues   HYPERTENSION Well controlled on current regimen. Renal function stable, no changes today.  Snoring disorder Negative sleep study for OSA.  screening labs for RLS were normal.   Bruxism, sleep-related zanaflex prescribed and patient encouraged to get her mouth guard replaced.    Updated Medication List Outpatient Encounter Prescriptions as of 04/05/2013  Medication Sig Dispense Refill  . cetirizine (ZYRTEC) 10 MG tablet Take 10 mg by mouth daily.        . clonazePAM (KLONOPIN) 1 MG tablet TAKE 1 TABLET BY MOUTH AT BEDTIME  30 tablet  3  . FLUoxetine (PROZAC) 20 MG tablet Take 1 tablet (20 mg total) by mouth daily.  30 tablet  3  . fluticasone (FLONASE) 50 MCG/ACT nasal spray Place 2 sprays into the nose daily.  16 g  6  . Hyoscyamine Sulfate 0.375 MG TBCR Take one tablet by mouth twice daily for 4--5 days and then as needed  25 tablet  1  . omeprazole (PRILOSEC OTC) 20 MG tablet Take 1 tablet (20 mg total) by mouth daily.  30 tablet  11  . rizatriptan (MAXALT) 10 MG tablet TAKE 1 TABLET (10 MG TOTAL) BY MOUTH AS NEEDED FOR MIGRAINE. MAY REPEAT IN 2 HOURS IF NEEDED  10 tablet  0  . topiramate (TOPAMAX) 25 MG tablet Take 1 tablet (25 mg total) by mouth 2 (two) times daily.  60 tablet  3  . diclofenac (VOLTAREN) 75 MG EC tablet Take 1 tablet (75 mg total) by mouth 2 (two) times daily.  60 tablet  3  . tiZANidine  (ZANAFLEX) 4 MG tablet Take 1 tablet (4 mg total) by mouth every 6 (six) hours as needed.  60 tablet  3  . [DISCONTINUED] diclofenac (VOLTAREN) 75 MG EC tablet Take 1 tablet (75 mg total) by mouth 2 (two) times daily.  30 tablet  0  . [DISCONTINUED] meloxicam (MOBIC) 15 MG tablet Take 1 tablet (15 mg total) by mouth daily.  30 tablet  1   No facility-administered encounter medications on file as of 04/05/2013.     Orders Placed This Encounter  Procedures  . Lipid panel  . LDL cholesterol, direct    No Follow-up on file.

## 2013-04-05 NOTE — Patient Instructions (Addendum)
We are repeating your fasting lipids today (sans statin)  Adding back zanaflex in the eveingn for bruxism  Samples of relpax to tide you over until you can refill your maxalt

## 2013-04-06 LAB — HM MAMMOGRAPHY

## 2013-04-06 LAB — HM PAP SMEAR

## 2013-04-08 ENCOUNTER — Encounter: Payer: Self-pay | Admitting: Internal Medicine

## 2013-04-08 DIAGNOSIS — G4763 Sleep related bruxism: Secondary | ICD-10-CM | POA: Insufficient documentation

## 2013-04-08 NOTE — Assessment & Plan Note (Signed)
Wt loss of 17 lbs since March,  6 in the last 2 months. With increased attention to diet and exercise.  Encouragement and direction given.

## 2013-04-08 NOTE — Assessment & Plan Note (Signed)
topiriamate started.  Samples of relpax given sinc eseh ran out of maxalt early.

## 2013-04-08 NOTE — Assessment & Plan Note (Signed)
Without statin her total chol is 250,  LDL 180 and trigs 186.  DL has also dropped to 40.  Will recommend low GI diet and repeat lipids  in 3 months.

## 2013-04-08 NOTE — Assessment & Plan Note (Signed)
Well controlled on current regimen. Renal function stable, no changes today. 

## 2013-04-08 NOTE — Assessment & Plan Note (Signed)
zanaflex prescribed and patient encouraged to get her mouth guard replaced.

## 2013-04-08 NOTE — Assessment & Plan Note (Signed)
Negative sleep study for OSA.  screening labs for RLS were normal.

## 2013-04-08 NOTE — Assessment & Plan Note (Signed)
Liver enzymes have been normal .  continue weight loss and control of all metabolic issues

## 2013-04-19 ENCOUNTER — Telehealth: Payer: Self-pay | Admitting: *Deleted

## 2013-04-19 NOTE — Telephone Encounter (Signed)
Called [facility] at [phone] and did a prior authorization for the Omeprazole 20 mg once daily. ( # 30 with 11 refills. ) The case ID # is [id number].  This was approved good from 04-19-2013 to 7-16- 2015.  The patient will get a call and outbound letter from [employer].  We will get a fax with this information. I called and spoke to the patient to inform her.

## 2013-04-20 ENCOUNTER — Encounter: Payer: Self-pay | Admitting: Internal Medicine

## 2013-04-28 ENCOUNTER — Other Ambulatory Visit: Payer: Self-pay | Admitting: Internal Medicine

## 2013-04-29 ENCOUNTER — Other Ambulatory Visit: Payer: Self-pay | Admitting: Family Medicine

## 2013-05-02 ENCOUNTER — Encounter: Payer: Self-pay | Admitting: Internal Medicine

## 2013-05-02 MED ORDER — CLONAZEPAM 1 MG PO TABS: ORAL_TABLET | ORAL | Status: DC

## 2013-05-02 MED ORDER — CLONAZEPAM 0.5 MG PO TABS: 0.5000 mg | ORAL_TABLET | Freq: Two times a day (BID) | ORAL | Status: DC | PRN

## 2013-05-03 ENCOUNTER — Other Ambulatory Visit: Payer: Self-pay | Admitting: *Deleted

## 2013-05-05 MED ORDER — FLUOXETINE HCL 20 MG PO TABS: 20.0000 mg | ORAL_TABLET | Freq: Every day | ORAL | Status: DC

## 2013-05-05 NOTE — Telephone Encounter (Signed)
I have printed for you to sign. AS approval

## 2013-05-05 NOTE — Telephone Encounter (Signed)
Please advise as to protcol may I refill

## 2013-05-11 ENCOUNTER — Encounter: Payer: Self-pay | Admitting: Internal Medicine

## 2013-05-12 ENCOUNTER — Encounter: Payer: Self-pay | Admitting: Internal Medicine

## 2013-06-17 ENCOUNTER — Encounter: Payer: Self-pay | Admitting: Family

## 2013-06-17 ENCOUNTER — Ambulatory Visit (INDEPENDENT_AMBULATORY_CARE_PROVIDER_SITE_OTHER): Payer: BC Managed Care – PPO | Admitting: Family

## 2013-06-17 VITALS — BP 120/80 | HR 103 | Temp 97.8°F | Resp 20 | Wt 228.0 lb

## 2013-06-17 DIAGNOSIS — R05 Cough: Secondary | ICD-10-CM

## 2013-06-17 DIAGNOSIS — J069 Acute upper respiratory infection, unspecified: Secondary | ICD-10-CM

## 2013-06-17 MED ORDER — METHYLPREDNISOLONE 4 MG PO KIT: PACK | ORAL | Status: AC

## 2013-06-17 NOTE — Progress Notes (Signed)
Subjective:    Patient ID: Selena [name], female    DOB: [birth year], 47 y.o.   MRN: [id number]  HPI 47 year old female, nonsmoker is in today with worsening c/o cough, congestion, ST, and low grade fever x 1 day. She has taken Mucinex but is unsure if it is helping. She is a Manufacturing systems engineer.    Review of Systems  Constitutional: Positive for fever. Negative for chills.  HENT: Positive for congestion, sore throat, sneezing, postnasal drip and sinus pressure. Negative for rhinorrhea.   Respiratory: Positive for cough.   Cardiovascular: Negative.   Musculoskeletal: Negative.   Skin: Negative.   Allergic/Immunologic: Negative.   Neurological: Negative.   Psychiatric/Behavioral: Negative.    Past Medical History  Diagnosis Date  . Anemia, iron deficiency   . Depression   . Hyperlipidemia   . Hypertension   . Anxiety   . Gallstones   . Fibromyalgia   . Asthma   . Eating disorder     Binge eating  . Colon polyps 2014    History   Social History  . Marital Status: Married    Spouse Name: N/A    Number of Children: 1  . Years of Education: N/A   Occupational History  . Teaches preschool    Social History Main Topics  . Smoking status: Never Smoker   . Smokeless tobacco: Never Used  . Alcohol Use: Yes     Comment: 4 Times a month  . Drug Use: No  . Sexual Activity: Not on file   Other Topics Concern  . Not on file   Social History Narrative   2 Step children; asthma, ADHD    Past Surgical History  Procedure Laterality Date  . 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 Tonsillectomy  1989  . Cholecystectomy    . Ganglion cyst excision      Family History  Problem Relation Age of Onset  . Hyperlipidemia Mother   . Heart disease Mother     Atrial fibrilation  . Cancer Mother     ? Melanoma  . Diabetes Father   . Mental illness Father     Bipolar  . Cancer Sister     Clear cell sarcoma in leg  . Hypothyroidism Sister   . Hypertension  Maternal Grandmother   . Heart disease Maternal Grandmother     Afib  . Arthritis Paternal Grandmother   . Colon cancer Neg Hx   . Rectal cancer Neg Hx     Allergies  Allergen Reactions  . Sulfonamide Derivatives     REACTION: Itchy  . Topamax [Topiramate]     REACTION: bad reaction. put in hospital/chest pain    Current Outpatient Prescriptions on File Prior to Visit  Medication Sig Dispense Refill  . cetirizine (ZYRTEC) 10 MG tablet Take 10 mg by mouth daily.        . clonazePAM (KLONOPIN) 0.5 MG tablet Take 1 tablet (0.5 mg total) by mouth 2 (two) times daily as needed for anxiety.  20 tablet  1  . clonazePAM (KLONOPIN) 1 MG tablet TAKE 1 TABLET BY MOUTH AT BEDTIME  30 tablet  3  . diclofenac (VOLTAREN) 75 MG EC tablet Take 1 tablet (75 mg total) by mouth 2 (two) times daily.  60 tablet  3  . FLUoxetine (PROZAC) 20 MG tablet Take 1 tablet (20 mg total) by mouth daily.  30 tablet  3  . fluticasone (FLONASE) 50 MCG/ACT nasal spray Place  2 sprays into the nose daily.  16 g  6  . Hyoscyamine Sulfate 0.375 MG TBCR Take one tablet by mouth twice daily for 4--5 days and then as needed  25 tablet  1  . omeprazole (PRILOSEC OTC) 20 MG tablet Take 1 tablet (20 mg total) by mouth daily.  30 tablet  11  . rizatriptan (MAXALT) 10 MG tablet TAKE 1 TABLET (10 MG TOTAL) BY MOUTH AS NEEDED FOR MIGRAINE. MAY REPEAT IN 2 HOURS IF NEEDED  10 tablet  6  . tiZANidine (ZANAFLEX) 4 MG tablet Take 1 tablet (4 mg total) by mouth every 6 (six) hours as needed.  60 tablet  3  . topiramate (TOPAMAX) 25 MG tablet Take 1 tablet (25 mg total) by mouth 2 (two) times daily.  60 tablet  3   No current facility-administered medications on file prior to visit.    There were no vitals taken for this visit.chart     Objective:   Physical Exam  Constitutional: She is oriented to person, place, and time. She appears well-developed and well-nourished.  HENT:  Right Ear: External ear normal.  Left Ear: External ear  normal.  Nose: Nose normal.  Mouth/Throat: Oropharynx is clear and moist.  Neck: Normal range of motion. Neck supple.  Cardiovascular: Normal rate, regular rhythm and normal heart sounds.   Pulmonary/Chest: Effort normal and breath sounds normal.  Neurological: She is alert and oriented to person, place, and time.  Skin: Skin is warm and dry.  Psychiatric: She has a normal mood and affect.          Assessment & Plan:  Assessment: 1. Upper Respiratory Infection 2. Cough  Plan: Medrol DP as directed. Call the office if symptoms worsen or persist. Recheck as scheduled and as needed.

## 2013-06-17 NOTE — Patient Instructions (Addendum)

## 2013-07-06 ENCOUNTER — Encounter: Payer: Self-pay | Admitting: Internal Medicine

## 2013-07-14 ENCOUNTER — Ambulatory Visit: Payer: BC Managed Care – PPO | Admitting: Family Medicine

## 2013-07-21 ENCOUNTER — Encounter: Payer: Self-pay | Admitting: *Deleted

## 2013-07-24 ENCOUNTER — Encounter: Payer: Self-pay | Admitting: Internal Medicine

## 2013-07-24 ENCOUNTER — Ambulatory Visit (INDEPENDENT_AMBULATORY_CARE_PROVIDER_SITE_OTHER): Payer: BC Managed Care – PPO | Admitting: Internal Medicine

## 2013-07-24 VITALS — BP 136/96 | HR 93 | Temp 98.2°F | Resp 14 | Ht 62.0 in | Wt 224.5 lb

## 2013-07-24 DIAGNOSIS — R7309 Other abnormal glucose: Secondary | ICD-10-CM

## 2013-07-24 DIAGNOSIS — G4763 Sleep related bruxism: Secondary | ICD-10-CM

## 2013-07-24 DIAGNOSIS — M542 Cervicalgia: Secondary | ICD-10-CM

## 2013-07-24 DIAGNOSIS — R739 Hyperglycemia, unspecified: Secondary | ICD-10-CM

## 2013-07-24 DIAGNOSIS — R5381 Other malaise: Secondary | ICD-10-CM

## 2013-07-24 DIAGNOSIS — F341 Dysthymic disorder: Secondary | ICD-10-CM

## 2013-07-24 DIAGNOSIS — F411 Generalized anxiety disorder: Secondary | ICD-10-CM

## 2013-07-24 MED ORDER — FLUOXETINE HCL 20 MG PO TABS: 40.0000 mg | ORAL_TABLET | Freq: Every day | ORAL | Status: DC

## 2013-07-24 MED ORDER — CLONAZEPAM 1 MG PO TABS: ORAL_TABLET | ORAL | Status: DC

## 2013-07-24 NOTE — Assessment & Plan Note (Signed)
Poor control mood. Increase prozac 20 mg daily. increase to 40,  Add a morning dose of clonazepam
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
## 2013-07-24 NOTE — Patient Instructions (Signed)
Please take the tizanidine every night at bedtime  I am increasing the prozac to 40 mg daily   You can add 1/2 tablet of clonazepam as needed in the daytime for anxiety

## 2013-07-25 ENCOUNTER — Telehealth: Payer: Self-pay | Admitting: Internal Medicine

## 2013-07-25 DIAGNOSIS — F411 Generalized anxiety disorder: Secondary | ICD-10-CM | POA: Insufficient documentation

## 2013-07-25 DIAGNOSIS — M542 Cervicalgia: Secondary | ICD-10-CM | POA: Insufficient documentation

## 2013-07-25 LAB — COMPREHENSIVE METABOLIC PANEL
ALT: 22 U/L (ref 0–35)
AST: 20 U/L (ref 0–37)
Albumin: 4.4 g/dL (ref 3.5–5.2)
Alkaline Phosphatase: 53 U/L (ref 39–117)
BUN: 16 mg/dL (ref 6–23)
CO2: 21 mEq/L (ref 19–32)
Calcium: 9.4 mg/dL (ref 8.4–10.5)
Chloride: 106 mEq/L (ref 96–112)
Creatinine, Ser: 1 mg/dL (ref 0.4–1.2)
GFR: 65.15 mL/min (ref >60.00)
Glucose, Bld: 76 mg/dL (ref 70–99)
Potassium: 3.9 mEq/L (ref 3.5–5.1)
Sodium: 136 mEq/L (ref 135–145)
Total Bilirubin: 0.5 mg/dL (ref 0.3–1.2)
Total Protein: 7.8 g/dL (ref 6.0–8.3)

## 2013-07-25 LAB — CBC WITH DIFFERENTIAL/PLATELET
Basophils Absolute: 0.1 10*3/uL (ref 0.0–0.1)
Basophils Relative: 1.3 % (ref 0.0–3.0)
Eosinophils Absolute: 0.1 10*3/uL (ref 0.0–0.7)
Eosinophils Relative: 0.5 % (ref 0.0–5.0)
HCT: 43.5 % (ref 36.0–46.0)
Hemoglobin: 14.9 g/dL (ref 12.0–15.0)
Lymphocytes Relative: 27.9 % (ref 12.0–46.0)
Lymphs Abs: 3.1 10*3/uL (ref 0.7–4.0)
MCHC: 34.4 g/dL (ref 30.0–36.0)
MCV: 91.1 fl (ref 78.0–100.0)
Monocytes Absolute: 0.5 10*3/uL (ref 0.1–1.0)
Monocytes Relative: 4.6 % (ref 3.0–12.0)
Neutro Abs: 7.2 10*3/uL (ref 1.4–7.7)
Neutrophils Relative %: 65.7 % (ref 43.0–77.0)
Platelets: 322 10*3/uL (ref 150.0–400.0)
RBC: 4.77 Mil/uL (ref 3.87–5.11)
RDW: 12.9 % (ref 11.5–14.6)
WBC: 11 10*3/uL — ABNORMAL HIGH (ref 4.5–10.5)

## 2013-07-25 LAB — TSH: TSH: 0.5 u[IU]/mL (ref 0.35–5.50)

## 2013-07-25 LAB — HEMOGLOBIN A1C: Hgb A1c MFr Bld: 5.7 % (ref 4.6–6.5)

## 2013-07-25 NOTE — Assessment & Plan Note (Signed)
I have addressed  BMI and recommended wt loss of 10% of body weigh over the next 6 months using a low glycemic index diet and regular exercise a minimum of 5 days per week.   

## 2013-07-25 NOTE — Telephone Encounter (Signed)
i have ordered palin films of cervical spine to be done at her leisure at [facility], to investigate her headaches and neck pain .

## 2013-07-25 NOTE — Assessment & Plan Note (Signed)
Current headaches seem to be due to muscle spasm from bruxism plus/minus cervical spondylosis. Trial of tizandine at bedtime.  Consider cervical  spine films  if no improvement

## 2013-07-25 NOTE — Assessment & Plan Note (Signed)
Increased her prozac to 40 mg today.  Adding 0.5 mg clonazepam to morning

## 2013-07-25 NOTE — Assessment & Plan Note (Signed)
Advised to use tizanidine and clonazepam at bedtime.

## 2013-07-25 NOTE — Telephone Encounter (Signed)
Patient notified and stated she will follow direction.

## 2013-07-27 ENCOUNTER — Ambulatory Visit (INDEPENDENT_AMBULATORY_CARE_PROVIDER_SITE_OTHER)
Admission: RE | Admit: 2013-07-27 | Discharge: 2013-07-27 | Disposition: A | Discharge status: Home / Self Care | Payer: BC Managed Care – PPO | Source: Ambulatory Visit | Attending: Internal Medicine | Admitting: Internal Medicine

## 2013-07-27 ENCOUNTER — Encounter: Payer: Self-pay | Admitting: Internal Medicine

## 2013-07-27 DIAGNOSIS — M542 Cervicalgia: Secondary | ICD-10-CM

## 2013-07-30 ENCOUNTER — Encounter: Payer: Self-pay | Admitting: Internal Medicine

## 2013-08-10 ENCOUNTER — Other Ambulatory Visit: Payer: Self-pay

## 2013-08-17 ENCOUNTER — Encounter: Payer: Self-pay | Admitting: Internal Medicine

## 2013-08-20 ENCOUNTER — Other Ambulatory Visit: Payer: Self-pay | Admitting: Internal Medicine

## 2013-08-21 NOTE — Telephone Encounter (Signed)
Last OV 07/24/13 ok to fill?

## 2013-09-06 MED ORDER — TOPIRAMATE 50 MG PO TABS: 100.0000 mg | ORAL_TABLET | Freq: Every day | ORAL | Status: DC

## 2013-09-06 NOTE — Addendum Note (Signed)
Addended by: Sherlene Shams on: 09/06/2013 11:02 AM   Modules accepted: Orders

## 2013-10-23 ENCOUNTER — Encounter: Payer: Self-pay | Admitting: Internal Medicine

## 2013-10-30 ENCOUNTER — Encounter: Payer: Self-pay | Admitting: Internal Medicine

## 2013-10-30 ENCOUNTER — Ambulatory Visit (INDEPENDENT_AMBULATORY_CARE_PROVIDER_SITE_OTHER): Payer: BC Managed Care – PPO | Admitting: Internal Medicine

## 2013-10-30 ENCOUNTER — Telehealth: Payer: Self-pay | Admitting: Internal Medicine

## 2013-10-30 VITALS — BP 118/88 | HR 83 | Temp 98.2°F | Resp 16 | Ht 62.0 in | Wt 219.0 lb

## 2013-10-30 DIAGNOSIS — M940 Chondrocostal junction syndrome [Tietze]: Secondary | ICD-10-CM

## 2013-10-30 DIAGNOSIS — R079 Chest pain, unspecified: Secondary | ICD-10-CM | POA: Insufficient documentation

## 2013-10-30 MED ORDER — NORTRIPTYLINE HCL 10 MG PO CAPS: 10.0000 mg | ORAL_CAPSULE | Freq: Every day | ORAL | Status: DC

## 2013-10-30 NOTE — Progress Notes (Signed)
Subjective:    Patient ID: Selena [name], female    DOB: [birth year], 48 y.o.   MRN: [id number]  HPI Comments: New to me she comes in today for evaluation of sharp left-sided chest pain, she tells me that she has had pain intermittently for 30 years.  Chest Pain  This is a recurrent problem. The current episode started more than 1 year ago. The onset quality is sudden. The problem occurs intermittently. The problem has been unchanged. The pain is present in the lateral region. The pain is at a severity of 3/10. The pain is mild. The quality of the pain is described as sharp. The pain radiates to the upper back. Pertinent negatives include no abdominal pain, back pain, claudication, cough, diaphoresis, dizziness, exertional chest pressure, fever, headaches, hemoptysis, irregular heartbeat, leg pain, lower extremity edema, malaise/fatigue, nausea, near-syncope, numbness, orthopnea, palpitations, PND, shortness of breath, sputum production, syncope, vomiting or weakness. She has tried NSAIDs for the symptoms. The treatment provided mild relief. Prior workup: she tells me that she has been admitted for CP and she has had several extensive evaluations for the pain including GI and cardiac.      Review of Systems  Constitutional: Negative.  Negative for fever, chills, malaise/fatigue, diaphoresis, appetite change and fatigue.  HENT: Negative.   Eyes: Negative.   Respiratory: Negative.  Negative for apnea, cough, hemoptysis, sputum production, choking, chest tightness, shortness of breath, wheezing and stridor.   Cardiovascular: Positive for chest pain. Negative for palpitations, orthopnea, claudication, leg swelling, syncope, PND and near-syncope.  Gastrointestinal: Negative for nausea, vomiting, abdominal pain, diarrhea, constipation and blood in stool.  Endocrine: Negative.   Genitourinary: Negative.   Musculoskeletal: Negative.  Negative for back pain.  Allergic/Immunologic: Negative.     Neurological: Negative.  Negative for dizziness, weakness, numbness and headaches.  Hematological: Negative.  Negative for adenopathy. Does not bruise/bleed easily.  Psychiatric/Behavioral: Positive for sleep disturbance (insomnia). Negative for suicidal ideas, hallucinations, behavioral problems, confusion, self-injury, dysphoric mood, decreased concentration and agitation. The patient is not nervous/anxious and is not hyperactive.        Objective:   Physical Exam  Vitals reviewed. Constitutional: She is oriented to person, place, and time. She appears well-developed and well-nourished. No distress.  HENT:  Head: Normocephalic and atraumatic.  Mouth/Throat: Oropharynx is clear and moist. No oropharyngeal exudate.  Eyes: Conjunctivae are normal. Right eye exhibits no discharge. Left eye exhibits no discharge. No scleral icterus.  Neck: Normal range of motion. Neck supple. No JVD present. No tracheal deviation present. No thyromegaly present.  Cardiovascular: Normal rate, regular rhythm, normal heart sounds and intact distal pulses.  Exam reveals no gallop and no friction rub.   No murmur heard. Pulmonary/Chest: Effort normal and breath sounds normal. No stridor. No respiratory distress. She has no wheezes. She has no rales. Chest wall is not dull to percussion. She exhibits tenderness and bony tenderness. She exhibits no mass, no laceration, no crepitus, no edema, no deformity, no swelling and no retraction.    Abdominal: Soft. Bowel sounds are normal. She exhibits no distension and no mass. There is no tenderness. There is no rebound and no guarding.  Musculoskeletal: Normal range of motion. She exhibits no edema and no tenderness.  Lymphadenopathy:    She has no cervical adenopathy.  Neurological: She is oriented to person, place, and time.  Skin: Skin is warm and dry. No rash noted. She is not diaphoretic. No erythema. No pallor.     Lab Results  Component Value Date   WBC 11.0*  07/24/2013   HGB 14.9 07/24/2013   HCT 43.5 07/24/2013   PLT 322.0 07/24/2013   GLUCOSE 76 07/24/2013   CHOL 250* 04/05/2013   TRIG 186.0* 04/05/2013   HDL 40.80 04/05/2013   LDLDIRECT 180.1 04/05/2013   LDLCALC 102* 04/21/2011   ALT 22 07/24/2013   AST 20 07/24/2013   NA 136 07/24/2013   K 3.9 07/24/2013   CL 106 07/24/2013   CREATININE 1.0 07/24/2013   BUN 16 07/24/2013   CO2 21 07/24/2013   TSH 0.50 07/24/2013   INR 1.0 07/13/2008   HGBA1C 5.7 07/24/2013   MICROALBUR 0.50 06/17/2012       Assessment & Plan:

## 2013-10-30 NOTE — Patient Instructions (Signed)

## 2013-10-30 NOTE — Telephone Encounter (Signed)
FYI

## 2013-10-30 NOTE — Telephone Encounter (Signed)
Patient Information:  Caller Name: Lamaria  Phone: [phone]  Patient: Selena [name], Selena [name]  Gender: Female  DOB: 04/15/1956  Age: 48 Years  PCP: Deborra Medina (Adults only)  Pregnant: No  Office Follow Up:  Does the office need to follow up with this patient?: No  Instructions For The Office: N/A  RN Note:  Pt agrees to [place] today and home care advice and call back information  Symptoms  Reason For Call & Symptoms: Pt is reporting chest pain.  She states that onset was 1 week ago.  Pt states that this feeling has been present since 48 y/o.  Pt has been admitted for the same feeling recently.  Cardiac was r/o.  Pt states that she has pain with touch and breath on back and front.  She states that breathing makes worse.  Pt reports that pain was present last pm and she was able to sleep last pm and then states that she woke with the same pain.  Reviewed Health History In EMR: Yes  Reviewed Medications In EMR: Yes  Reviewed Allergies In EMR: Yes  Reviewed Surgeries / Procedures: Yes  Date of Onset of Symptoms: 10/23/2013 OB / GYN:  LMP: Unknown  Guideline(s) Used:  Chest Pain  Disposition Per Guideline:   See Today in Office  Reason For Disposition Reached:   Intermittent chest pains persist > 3 days  Advice Given:  Call Back If:  You become worse.  Patient Will Follow Care Advice:  YES  Appointment Scheduled:  10/30/2013 12:00:00 Appointment Scheduled Provider:  Other  Triage and Documentation performed by Sherryle Lis, RN-CAN

## 2013-10-30 NOTE — Telephone Encounter (Signed)
Pt states she has talked with triage this morning and no one has called her back.  States she is having chest pain in the front and back on the left side, worse when she breathes x 2 days.  Does not feel it is heart related.  No appt available.  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

## 2013-10-30 NOTE — Progress Notes (Signed)
Pre visit review using our clinic review tool, if applicable. No additional management support is needed unless otherwise documented below in the visit note. 

## 2013-11-01 ENCOUNTER — Encounter: Payer: Self-pay | Admitting: Internal Medicine

## 2013-11-01 NOTE — Assessment & Plan Note (Signed)
She will continue nsaids and will also try nortriptyline for the pain

## 2013-11-01 NOTE — Assessment & Plan Note (Signed)
She has had chronic recurrent episodes of chest pain, her EKG is normal today and her prior workups have been normal I think this is MS and will treat for costochondritis

## 2013-11-22 ENCOUNTER — Other Ambulatory Visit: Payer: Self-pay | Admitting: *Deleted

## 2013-11-22 MED ORDER — FLUOXETINE HCL 20 MG PO TABS: 40.0000 mg | ORAL_TABLET | Freq: Every day | ORAL | Status: DC

## 2013-11-30 ENCOUNTER — Other Ambulatory Visit: Payer: Self-pay | Admitting: Internal Medicine

## 2013-12-01 NOTE — Telephone Encounter (Signed)
Last visit 07/24/13, refill?

## 2013-12-05 ENCOUNTER — Other Ambulatory Visit: Payer: Self-pay | Admitting: *Deleted

## 2013-12-05 MED ORDER — TOPIRAMATE 50 MG PO TABS: 100.0000 mg | ORAL_TABLET | Freq: Every day | ORAL | Status: DC

## 2013-12-07 ENCOUNTER — Ambulatory Visit (INDEPENDENT_AMBULATORY_CARE_PROVIDER_SITE_OTHER): Payer: BC Managed Care – PPO | Admitting: Internal Medicine

## 2013-12-07 ENCOUNTER — Encounter: Payer: Self-pay | Admitting: Internal Medicine

## 2013-12-07 ENCOUNTER — Telehealth: Payer: Self-pay | Admitting: Internal Medicine

## 2013-12-07 VITALS — BP 110/76 | HR 94 | Temp 98.7°F | Wt 216.0 lb

## 2013-12-07 DIAGNOSIS — J019 Acute sinusitis, unspecified: Secondary | ICD-10-CM

## 2013-12-07 DIAGNOSIS — B9789 Other viral agents as the cause of diseases classified elsewhere: Secondary | ICD-10-CM

## 2013-12-07 DIAGNOSIS — R509 Fever, unspecified: Secondary | ICD-10-CM

## 2013-12-07 DIAGNOSIS — J069 Acute upper respiratory infection, unspecified: Secondary | ICD-10-CM

## 2013-12-07 LAB — POCT INFLUENZA A/B
Influenza A, POC: NEGATIVE
Influenza B, POC: NEGATIVE

## 2013-12-07 MED ORDER — PREDNISONE 10 MG PO TABS: ORAL_TABLET | ORAL | Status: DC

## 2013-12-07 NOTE — Progress Notes (Signed)
HPI  Pt presents to the clinic today with c/o sore throat, cough and ear pain. This started about 4 days ago. She reports she is having difficulty swallowing. The cough is productive of thick green blood tinged mucous. She has taken OTC tylenol  And mucinex without relief. She also takes zyrtec and flonase. She does have a history of allergies and asthma. She has had sick contacts.  Review of Systems      Past Medical History  Diagnosis Date  . Anemia, iron deficiency   . Depression   . Hyperlipidemia   . Hypertension   . Anxiety   . Gallstones   . Fibromyalgia   . Asthma   . Eating disorder     Binge eating  . Colon polyps 2014    Family History  Problem Relation Age of Onset  . Hyperlipidemia Mother   . Heart disease Mother     Atrial fibrilation  . Cancer Mother     ? Melanoma  . Diabetes Father   . Mental illness Father     Bipolar  . Cancer Sister     Clear cell sarcoma in leg  . Hypothyroidism Sister   . Hypertension Maternal Grandmother   . Heart disease Maternal Grandmother     Afib  . Arthritis Paternal Grandmother   . Colon cancer Neg Hx   . Rectal cancer Neg Hx     History   Social History  . Marital Status: Married    Spouse Name: N/A    Number of Children: 1  . Years of Education: N/A   Occupational History  . Teaches preschool    Social History Main Topics  . Smoking status: Never Smoker   . Smokeless tobacco: Never Used  . Alcohol Use: Yes     Comment: 4 Times a month  . Drug Use: No  . Sexual Activity: Yes    Birth Control/ Protection: IUD   Other Topics Concern  . Not on file   Social History Narrative   2 Step children; asthma, ADHD    Allergies  Allergen Reactions  . Sulfonamide Derivatives     REACTION: Itchy  . Topamax [Topiramate]     REACTION: bad reaction. put in hospital/chest pain     Constitutional: Positive headache, fatigue and fever. Denies abrupt weight changes.  HEENT:  Positive sore throat. Denies eye  redness, eye pain, pressure behind the eyes, facial pain, nasal congestion, ear pain, ringing in the ears, wax buildup, runny nose or bloody nose. Respiratory: Positive cough. Denies difficulty breathing or shortness of breath.  Cardiovascular: Denies chest pain, chest tightness, palpitations or swelling in the hands or feet.   No other specific complaints in a complete review of systems (except as listed in HPI above).  Objective:   BP 110/76  Pulse 94  Temp(Src) 98.7 F (37.1 C) (Oral)  Wt 216 lb (97.977 kg)  SpO2 98% Wt Readings from Last 3 Encounters:  12/07/13 216 lb (97.977 kg)  10/30/13 219 lb (99.338 kg)  07/24/13 224 lb 8 oz (101.833 kg)     General: Appears her stated age, well developed, well nourished in NAD. HEENT: Head: normal shape and size, sinus mildly tender to palpation; Eyes: sclera white, no icterus, conjunctiva pink, PERRLA and EOMs intact; Ears: Tm's gray and intact, normal light reflex; Nose: mucosa pink and moist, septum midline; Throat/Mouth: + PND. Teeth present, mucosa erythematous and moist, no exudate noted, no lesions or ulcerations noted.  Neck: Neck supple, trachea  midline. No massses, lumps or thyromegaly present.  Cardiovascular: Normal rate and rhythm. S1,S2 noted.  No murmur, rubs or gallops noted. No JVD or BLE edema. No carotid bruits noted. Pulmonary/Chest: Normal effort and positive vesicular breath sounds. No respiratory distress. No wheezes, rales or ronchi noted.      Assessment & Plan:   Acute sinusitis with viral URI:  Get some rest and drink plenty of water Do salt water gargles for the sore throat eRx for pred taper x 6 days Continue zyrtec and flonase  If no improvement by Tuesday will call in Amoxil x 10 days  RTC as needed or if symptoms persist.

## 2013-12-07 NOTE — Patient Instructions (Addendum)
Viral Infections °A virus is a type of germ. Viruses can cause: °· Minor sore throats. °· Aches and pains. °· Headaches. °· Runny nose. °· Rashes. °· Watery eyes. °· Tiredness. °· Coughs. °· Loss of appetite. °· Feeling sick to your stomach (nausea). °· Throwing up (vomiting). °· Watery poop (diarrhea). °HOME CARE  °· Only take medicines as told by your doctor. °· Drink enough water and fluids to keep your pee (urine) clear or pale yellow. Sports drinks are a good choice. °· Get plenty of rest and eat healthy. Soups and broths with crackers or rice are fine. °GET HELP RIGHT AWAY IF:  °· You have a very bad headache. °· You have shortness of breath. °· You have chest pain or neck pain. °· You have an unusual rash. °· You cannot stop throwing up. °· You have watery poop that does not stop. °· You cannot keep fluids down. °· You or your child has a temperature by mouth above 102° F (38.9° C), not controlled by medicine. °· Your baby is older than 3 months with a rectal temperature of 102° F (38.9° C) or higher. °· Your baby is 3 months old or younger with a rectal temperature of 100.4° F (38° C) or higher. °MAKE SURE YOU:  °· Understand these instructions. °· Will watch this condition. °· Will get help right away if you are not doing well or get worse. °Document Released: 09/03/2008 Document Revised: 12/14/2011 Document Reviewed: 01/27/2011 °Exit[employer]® Patient Information ©2014 [employer]. ° °

## 2013-12-07 NOTE — Progress Notes (Signed)
Pre visit review using our clinic review tool, if applicable. No additional management support is needed unless otherwise documented below in the visit note. 

## 2013-12-07 NOTE — Telephone Encounter (Signed)
Pt was seen at [facility] office for acute visit.  Flu test negative.  Pt was not given any antibiotics but states she feels very bad.  States she was prescribed steroid, but pt thought that would make it worse.  Pt would like Dr. Derrel Nip to review the record of her visit 3/5 to see if she concurs.    Pt symptoms:  Ear pain, sore throat, cough, runny nose and stuffy nose, coughing up yellow/green phlegm, painful swallowing, hurts to talk, very tired.  Pt is frustratred that she was told by [facility] that she had to be sick like this for more than 10 days before they would help.  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  States it is okay if Dr. Derrel Nip agrees with their care, she just wants to be sure and would like Dr. Lupita Dawn opinion.

## 2013-12-08 MED ORDER — AMOXICILLIN-POT CLAVULANATE 875-125 MG PO TABS: 1.0000 | ORAL_TABLET | Freq: Two times a day (BID) | ORAL | Status: DC

## 2013-12-08 NOTE — Telephone Encounter (Signed)
Tried to reach patient  Left message. Please advise

## 2013-12-08 NOTE — Telephone Encounter (Signed)
Left detailed message for patient as to instruction to pick up ABX

## 2013-12-08 NOTE — Telephone Encounter (Signed)
It appears that her symptoms present for 5 days now.,  Will treat with abx,  augmentin called in .  continue prednisone

## 2013-12-11 ENCOUNTER — Other Ambulatory Visit: Payer: Self-pay | Admitting: Family Medicine

## 2013-12-11 NOTE — Telephone Encounter (Signed)
Last office visit 12/07/2013 with Webb Silversmith.  Ok to refill?

## 2013-12-12 ENCOUNTER — Other Ambulatory Visit: Payer: Self-pay | Admitting: Internal Medicine

## 2013-12-15 ENCOUNTER — Encounter: Payer: Self-pay | Admitting: Internal Medicine

## 2013-12-15 ENCOUNTER — Ambulatory Visit (INDEPENDENT_AMBULATORY_CARE_PROVIDER_SITE_OTHER): Payer: BC Managed Care – PPO | Admitting: Internal Medicine

## 2013-12-15 ENCOUNTER — Telehealth: Payer: Self-pay

## 2013-12-15 VITALS — BP 112/74 | HR 82 | Temp 98.1°F | Wt 215.2 lb

## 2013-12-15 DIAGNOSIS — R059 Cough, unspecified: Secondary | ICD-10-CM

## 2013-12-15 DIAGNOSIS — J209 Acute bronchitis, unspecified: Secondary | ICD-10-CM

## 2013-12-15 DIAGNOSIS — R062 Wheezing: Secondary | ICD-10-CM

## 2013-12-15 DIAGNOSIS — R05 Cough: Secondary | ICD-10-CM

## 2013-12-15 MED ORDER — ALBUTEROL SULFATE HFA 108 (90 BASE) MCG/ACT IN AERS: 2.0000 | INHALATION_SPRAY | Freq: Four times a day (QID) | RESPIRATORY_TRACT | Status: DC | PRN

## 2013-12-15 NOTE — Telephone Encounter (Signed)
Pt was seen 12/07/13; prednisone helped nasal congestion; Dr Derrel Nip gave pt antibiotic on 12/08/13; now pt having continuous prod cough with green phlegm and now chest congestion with wheezing. Pt request med for coughing since Delsym not helping. Pt said Dr Derrel Nip out of office and [facility] office advised pt to contact Webb Silversmith NP. Pt scheduled appt today at 4 with Webb Silversmith NP.(pt at work and cannot schedule appt prior to 4pm).

## 2013-12-15 NOTE — Progress Notes (Signed)
Pre visit review using our clinic review tool, if applicable. No additional management support is needed unless otherwise documented below in the visit note. 

## 2013-12-15 NOTE — Patient Instructions (Addendum)

## 2013-12-15 NOTE — Progress Notes (Signed)
HPI  Pt presents to the clinic today with c/o cough and wheezing. This has been going on for about 10 days now. She was seen 12/07/13 for the same. She was diagnosed with a viral URI with cough. She was not treated at that time. She called back a few days later and reports that she was feeling worse. She was started on prednisone and amoxicillin. She reports she has not had any improvement in her symptoms. She reports that she continues to have a cough. The cough is productive of thick green sputum. She has had low grade fevers and fatigue. She denies chills and body aches. She does have a history allergies and EIA. She is not taking any allergy medications or inhalers. She is taking Delsym OTC.  Review of Systems      Past Medical History  Diagnosis Date  . Anemia, iron deficiency   . Depression   . Hyperlipidemia   . Hypertension   . Anxiety   . Gallstones   . Fibromyalgia   . Asthma   . Eating disorder     Binge eating  . Colon polyps 2014    Family History  Problem Relation Age of Onset  . Hyperlipidemia Mother   . Heart disease Mother     Atrial fibrilation  . Cancer Mother     ? Melanoma  . Diabetes Father   . Mental illness Father     Bipolar  . Cancer Sister     Clear cell sarcoma in leg  . Hypothyroidism Sister   . Hypertension Maternal Grandmother   . Heart disease Maternal Grandmother     Afib  . Arthritis Paternal Grandmother   . Colon cancer Neg Hx   . Rectal cancer Neg Hx     History   Social History  . Marital Status: Married    Spouse Name: N/A    Number of Children: 1  . Years of Education: N/A   Occupational History  . Teaches preschool    Social History Main Topics  . Smoking status: Never Smoker   . Smokeless tobacco: Never Used  . Alcohol Use: Yes     Comment: 4 Times a month  . Drug Use: No  . Sexual Activity: Yes    Birth Control/ Protection: IUD   Other Topics Concern  . Not on file   Social History Narrative   2 Step children;  asthma, ADHD    Allergies  Allergen Reactions  . Sulfonamide Derivatives     REACTION: Itchy  . Topamax [Topiramate]     REACTION: bad reaction. put in hospital/chest pain     Constitutional: Positive headache, fatigue and fever. Denies abrupt weight changes.  HEENT:  Positive sore throat. Denies eye redness, eye pain, pressure behind the eyes, facial pain, nasal congestion, ear pain, ringing in the ears, wax buildup, runny nose or bloody nose. Respiratory: Positive cough. Denies difficulty breathing or shortness of breath.  Cardiovascular: Denies chest pain, chest tightness, palpitations or swelling in the hands or feet.   No other specific complaints in a complete review of systems (except as listed in HPI above).  Objective:   BP 112/74  Pulse 82  Temp(Src) 98.1 F (36.7 C) (Oral)  Wt 215 lb 4 oz (97.637 kg)  SpO2 98% Wt Readings from Last 3 Encounters:  12/15/13 215 lb 4 oz (97.637 kg)  12/07/13 216 lb (97.977 kg)  10/30/13 219 lb (99.338 kg)     General: Appears his stated age, well  developed, well nourished in NAD. HEENT: Head: normal shape and size; Eyes: sclera white, no icterus, conjunctiva pink, PERRLA and EOMs intact; Ears: Tm's gray and intact, normal light reflex; Nose: mucosa pink and moist, septum midline; Throat/Mouth: + PND. Teeth present, mucosa erythematous and moist, no exudate noted, no lesions or ulcerations noted.  Neck:  Neck supple, trachea midline. No massses, lumps or thyromegaly present.  Cardiovascular: Normal rate and rhythm. S1,S2 noted.  No murmur, rubs or gallops noted. No JVD or BLE edema. No carotid bruits noted. Pulmonary/Chest: Normal effort and intermittent expiratory wheeze. No respiratory distress. No rales or ronchi noted.      Assessment & Plan:   Cough, lingering from acute bronchitis:  Get some rest and drink plenty of water Finish out your Amoxil eRx for albuterol inhaler Try Mucinex OTC Continue Delsym  If no improvement  by Monday, will call in zpack  RTC as needed or if symptoms persist.

## 2013-12-18 ENCOUNTER — Telehealth: Payer: Self-pay

## 2013-12-18 ENCOUNTER — Other Ambulatory Visit: Payer: Self-pay | Admitting: Internal Medicine

## 2013-12-18 MED ORDER — BENZONATATE 100 MG PO CAPS: 100.0000 mg | ORAL_CAPSULE | Freq: Two times a day (BID) | ORAL | Status: DC | PRN

## 2013-12-18 NOTE — Telephone Encounter (Signed)
Yes i will call in tessalon for her

## 2013-12-18 NOTE — Telephone Encounter (Signed)
Pt called in stating that she still has been coughing and wondered if Tessalon would help her or not as her friend was prescribed the medication and stated it helped her with her cough--please advise

## 2013-12-18 NOTE — Telephone Encounter (Signed)
i called pt and she states the albuterol really helped and she feels better--so Rx not needed

## 2013-12-22 ENCOUNTER — Telehealth: Payer: Self-pay | Admitting: Internal Medicine

## 2013-12-22 DIAGNOSIS — R059 Cough, unspecified: Secondary | ICD-10-CM

## 2013-12-22 DIAGNOSIS — R05 Cough: Secondary | ICD-10-CM

## 2013-12-22 NOTE — Telephone Encounter (Signed)
Yes, if her cough is persistent and she has exposure to pertussis, she should get tested.  If she wants to get it done here I can put the order in

## 2013-12-22 NOTE — Telephone Encounter (Signed)
Patient Information:  Caller Name: [name]  Phone: [phone]  Patient: Selena [name], Selena [name]  Gender: Female  DOB: [birth year]  Age: 48 Years  PCP: Webb Silversmith  Pregnant: No  Office Follow Up:  Does the office need to follow up with this patient?: Yes  Instructions For The Office: Coworker (+) for persussis- does she need to be tested or treated?   Symptoms  Reason For Call & Symptoms: Occasional dry cough for past 3 weeks. Seen in office 2x and prescribed Albuterol Inhaler and using prn but still coughing. She recently found out the other Preschool teacher tested positive for Pneumonia and Pertussis and is taking antibiotic. She is wondering if she needs to be tested for Exposure since they work together closely and many of the children are sick. Please call her back.  Reviewed Health History In EMR: Yes  Reviewed Medications In EMR: Yes  Reviewed Allergies In EMR: Yes  Reviewed Surgeries / Procedures: Yes  Date of Onset of Symptoms: 11/30/2013 OB / GYN:  LMP: 12/18/2013  Guideline(s) Used:  Cough  Disposition Per Guideline:   See Today or Tomorrow in Office  Reason For Disposition Reached:   Continuous (nonstop) coughing interferes with work or school and no improvement using cough treatment per Care Advice  Advice Given:  Prevent Dehydration:  Drink adequate liquids.  This will help soothe an irritated or dry throat and loosen up the phlegm.  Expected Course:   The expected course depends on what is causing the cough.  Viral bronchitis (chest cold) causes a cough that lasts 1 to 3 weeks. Sometimes you may cough up lots of phlegm (sputum, mucus). The mucus can normally be white, gray, yellow, or green.  Call Back If:  Difficulty breathing  Cough lasts more than 3 weeks  Fever lasts > 3 days  You become worse.  Patient Refused Recommendation:  Patient Refused Care Advice  Wanting to know if antibiotic needs to be called in or if she needs to be tested  pertussis.

## 2013-12-22 NOTE — Telephone Encounter (Signed)
Pt has been seen twice at [facility] for a persistent cough.  Still has the cough.  States she found out today that her friend that she works with closely has tested positive for pneumonia and pertussis.  Asking if she can be tested for pertussis.  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  Pt states she will also try to contact [facility] regarding this.

## 2013-12-22 NOTE — Telephone Encounter (Signed)
Lab scheduled °

## 2013-12-25 ENCOUNTER — Other Ambulatory Visit: Payer: Self-pay | Admitting: *Deleted

## 2013-12-25 ENCOUNTER — Other Ambulatory Visit: Payer: BC Managed Care – PPO

## 2013-12-25 NOTE — Telephone Encounter (Signed)
Ok to fill 

## 2013-12-26 MED ORDER — CLONAZEPAM 1 MG PO TABS: ORAL_TABLET | ORAL | Status: DC

## 2013-12-26 NOTE — Telephone Encounter (Signed)
Refill one 30 days only.  Has not been seen in over 6 months so needs office visit prior to any more refills 

## 2013-12-26 NOTE — Telephone Encounter (Signed)
Refill printed.Script sent

## 2013-12-27 ENCOUNTER — Other Ambulatory Visit: Payer: Self-pay | Admitting: *Deleted

## 2013-12-27 ENCOUNTER — Encounter: Payer: Self-pay | Admitting: Internal Medicine

## 2013-12-27 NOTE — Telephone Encounter (Signed)
Okay to refill? 

## 2013-12-28 MED ORDER — VENLAFAXINE HCL ER 150 MG PO CP24: ORAL_CAPSULE | ORAL | Status: DC

## 2013-12-28 MED ORDER — CLONAZEPAM 1 MG PO TABS: ORAL_TABLET | ORAL | Status: DC

## 2013-12-28 NOTE — Telephone Encounter (Signed)
Please advise 

## 2013-12-28 NOTE — Telephone Encounter (Signed)
Script faxed.

## 2014-01-22 ENCOUNTER — Encounter: Payer: Self-pay | Admitting: Internal Medicine

## 2014-01-25 ENCOUNTER — Other Ambulatory Visit: Payer: Self-pay | Admitting: Internal Medicine

## 2014-02-03 ENCOUNTER — Encounter: Payer: Self-pay | Admitting: Family Medicine

## 2014-02-03 ENCOUNTER — Ambulatory Visit (INDEPENDENT_AMBULATORY_CARE_PROVIDER_SITE_OTHER): Payer: BC Managed Care – PPO | Admitting: Family Medicine

## 2014-02-03 VITALS — BP 120/80 | HR 89 | Temp 98.6°F | Wt 215.0 lb

## 2014-02-03 DIAGNOSIS — J069 Acute upper respiratory infection, unspecified: Secondary | ICD-10-CM

## 2014-02-03 DIAGNOSIS — N760 Acute vaginitis: Secondary | ICD-10-CM

## 2014-02-03 MED ORDER — DOXYCYCLINE HYCLATE 100 MG PO CAPS
100.0000 mg | ORAL_CAPSULE | Freq: Two times a day (BID) | ORAL | Status: DC
Start: 2014-02-03 — End: 2014-04-18

## 2014-02-03 MED ORDER — HYDROCODONE-HOMATROPINE 5-1.5 MG/5ML PO SYRP: 5.0000 mL | ORAL_SOLUTION | Freq: Three times a day (TID) | ORAL | Status: DC | PRN

## 2014-02-03 MED ORDER — FLUCONAZOLE 150 MG PO TABS: 150.0000 mg | ORAL_TABLET | Freq: Once | ORAL | Status: DC

## 2014-02-03 MED ORDER — FLUTICASONE PROPIONATE HFA 44 MCG/ACT IN AERO: 2.0000 | INHALATION_SPRAY | Freq: Two times a day (BID) | RESPIRATORY_TRACT | Status: DC

## 2014-02-03 NOTE — Progress Notes (Signed)
Chief Complaint  Patient presents with  . Cough    congestion, wheezing, low grade fever     HPI:  -started: about 1 week ago -symptoms:nasal congestion, sore throat, cough, wheezing, low grade temp of 99 -denies:fever, SOB, NVD, tooth pain -has tried: tessalon perles -sick contacts/travel/risks: denies flu exposure, tick exposure or or Ebola risks -Hx of: allergies, ? Asthma - has alb for when sick, has take prednisone but prefers to hold off ROS: See pertinent positives and negatives per HPI.  Past Medical History  Diagnosis Date  . Anemia, iron deficiency   . Depression   . Hyperlipidemia   . Hypertension   . Anxiety   . Gallstones   . Fibromyalgia   . Asthma   . Eating disorder     Binge eating  . Colon polyps 2014    Past Surgical History  Procedure Laterality Date  . 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 Tonsillectomy  1989  . Cholecystectomy    . Ganglion cyst excision      Family History  Problem Relation Age of Onset  . Hyperlipidemia Mother   . Heart disease Mother     Atrial fibrilation  . Cancer Mother     ? Melanoma  . Diabetes Father   . Mental illness Father     Bipolar  . Cancer Sister     Clear cell sarcoma in leg  . Hypothyroidism Sister   . Hypertension Maternal Grandmother   . Heart disease Maternal Grandmother     Afib  . Arthritis Paternal Grandmother   . Colon cancer Neg Hx   . Rectal cancer Neg Hx     History   Social History  . Marital Status: Married    Spouse Name: N/A    Number of Children: 1  . Years of Education: N/A   Occupational History  . Teaches preschool    Social History Main Topics  . Smoking status: Never Smoker   . Smokeless tobacco: Never Used  . Alcohol Use: Yes     Comment: 4 Times a month  . Drug Use: No  . Sexual Activity: Yes    Birth Control/ Protection: IUD   Other Topics Concern  . None   Social History Narrative   2 Step children; asthma, ADHD    Current outpatient  prescriptions:albuterol (PROVENTIL HFA;VENTOLIN HFA) 108 (90 BASE) MCG/ACT inhaler, Inhale 2 puffs into the lungs every 6 (six) hours as needed for wheezing or shortness of breath., Disp: 1 Inhaler, Rfl: 0;  benzonatate (TESSALON) 100 MG capsule, Take 1 capsule (100 mg total) by mouth 2 (two) times daily as needed for cough., Disp: 20 capsule, Rfl: 0;  cetirizine (ZYRTEC) 10 MG tablet, Take 10 mg by mouth daily.  , Disp: , Rfl:  clonazePAM (KLONOPIN) 1 MG tablet, TAKE 1 TABLET BY MOUTH AT BEDTIME AND HALF A TABLET IN THE MORNING, Disp: 45 tablet, Rfl: 0;  diclofenac (VOLTAREN) 75 MG EC tablet, Take 1 tablet (75 mg total) by mouth 2 (two) times daily., Disp: 60 tablet, Rfl: 3;  FLUoxetine (PROZAC) 20 MG tablet, Take 2 tablets (40 mg total) by mouth daily., Disp: 60 tablet, Rfl: 2 fluticasone (FLONASE) 50 MCG/ACT nasal spray, Place 2 sprays into the nose daily., Disp: 16 g, Rfl: 6;  Hyoscyamine Sulfate 0.375 MG TBCR, Take one tablet by mouth twice daily for 4--5 days and then as needed, Disp: 25 tablet, Rfl: 1;  levonorgestrel (MIRENA) 20 MCG/24HR IUD, 1 Intra  Uterine Device (1 each total) by Intrauterine route once., Disp: 1 each, Rfl: 0 nortriptyline (PAMELOR) 10 MG capsule, Take 1 capsule (10 mg total) by mouth at bedtime., Disp: 30 capsule, Rfl: 2;  omeprazole (PRILOSEC OTC) 20 MG tablet, Take 1 tablet (20 mg total) by mouth daily., Disp: 30 tablet, Rfl: 11;  rizatriptan (MAXALT) 10 MG tablet, TAKE 1 TABLET (10 MG TOTAL) BY MOUTH AS NEEDED FOR MIGRAINE. MAY REPEAT IN 2 HOURS IF NEEDED, Disp: 10 tablet, Rfl: 5 tiZANidine (ZANAFLEX) 4 MG tablet, TAKE 1 TABLET (4 MG TOTAL) BY MOUTH EVERY 6 (SIX) HOURS AS NEEDED., Disp: 60 tablet, Rfl: 3;  topiramate (TOPAMAX) 50 MG tablet, Take 2 tablets (100 mg total) by mouth daily. At bedtime for migraine prevention, Disp: 60 tablet, Rfl: 2;  venlafaxine XR (EFFEXOR-XR) 150 MG 24 hr capsule, TAKE 1 CAPSULE BY MOUTH ONCE A DAY, Disp: 30 capsule, Rfl: 1 doxycycline (VIBRAMYCIN)  100 MG capsule, Take 1 capsule (100 mg total) by mouth 2 (two) times daily., Disp: 20 capsule, Rfl: 0;  fluconazole (DIFLUCAN) 150 MG tablet, Take 1 tablet (150 mg total) by mouth once., Disp: 1 tablet, Rfl: 0;  fluticasone (FLOVENT HFA) 44 MCG/ACT inhaler, Inhale 2 puffs into the lungs 2 (two) times daily., Disp: 1 Inhaler, Rfl: 0 HYDROcodone-homatropine (HYCODAN) 5-1.5 MG/5ML syrup, Take 5 mLs by mouth every 8 (eight) hours as needed for cough., Disp: 120 mL, Rfl: 0  EXAM:  Filed Vitals:   02/03/14 1035  BP: 120/80  Pulse: 89  Temp: 98.6 F (37 C)    Body mass index is 39.31 kg/(m^2).  GENERAL: vitals reviewed and listed above, alert, oriented, appears well hydrated and in no acute distress  HEENT: atraumatic, conjunttiva clear, no obvious abnormalities on inspection of external nose and ears, normal appearance of ear canals and TMs, clear nasal congestion, mild post oropharyngeal erythema with PND, no tonsillar edema or exudate, no sinus TTP  NECK: no obvious masses on inspection  LUNGS: clear to auscultation bilaterally, no wheezes, rales or rhonchi, good air movement  CV: HRRR, no peripheral edema  MS: moves all extremities without noticeable abnormality  PSYCH: pleasant and cooperative, no obvious depression or anxiety  ASSESSMENT AND PLAN:  Discussed the following assessment and plan:  Upper respiratory infection - Plan: fluticasone (FLOVENT HFA) 44 MCG/ACT inhaler, HYDROcodone-homatropine (HYCODAN) 5-1.5 MG/5ML syrup, doxycycline (VIBRAMYCIN) 100 MG capsule  Vaginitis and vulvovaginitis - Plan: fluconazole (DIFLUCAN) 150 MG tablet  -We discussed potential etiologies  -We discussed treatment side effects, likely course, , transmission, and signs of developing a serious illness. -of course, we advised to return or notify a doctor immediately if symptoms worsen or persist or new concerns arise. -follow up with PCP next week given recurrent bronchitis   There are no  Patient Instructions on file for this visit.   Lucretia Kern

## 2014-02-03 NOTE — Progress Notes (Signed)
Pre visit review using our clinic review tool, if applicable. No additional management support is needed unless otherwise documented below in the visit note. 

## 2014-02-03 NOTE — Patient Instructions (Signed)
-  As we discussed, we have prescribed a new medication for you at this appointment. We discussed the common and serious potential adverse effects of this medication and you can review these and more with the pharmacist when you pick up your medication.  Please follow the instructions for use carefully and notify us immediately if you have any problems taking this medication.  -follow up with your doctor next week or sooner if any worsening

## 2014-02-13 ENCOUNTER — Other Ambulatory Visit: Payer: Self-pay | Admitting: Nurse Practitioner

## 2014-02-22 ENCOUNTER — Other Ambulatory Visit: Payer: Self-pay | Admitting: Internal Medicine

## 2014-02-27 ENCOUNTER — Other Ambulatory Visit: Payer: Self-pay | Admitting: *Deleted

## 2014-02-28 MED ORDER — CLONAZEPAM 1 MG PO TABS: ORAL_TABLET | ORAL | Status: DC

## 2014-02-28 NOTE — Telephone Encounter (Signed)
Last visit  07/2013

## 2014-02-28 NOTE — Telephone Encounter (Signed)
Refill FOR 30 days only.  Has not been seen in 6 MONTHS

## 2014-02-28 NOTE — Telephone Encounter (Signed)
Sent mychart message regarding need for appt 

## 2014-03-02 NOTE — Telephone Encounter (Signed)
Mailed unread message to pt  

## 2014-03-18 ENCOUNTER — Other Ambulatory Visit: Payer: Self-pay | Admitting: Internal Medicine

## 2014-03-26 ENCOUNTER — Telehealth: Payer: Self-pay | Admitting: Internal Medicine

## 2014-03-26 DIAGNOSIS — R739 Hyperglycemia, unspecified: Secondary | ICD-10-CM

## 2014-03-26 DIAGNOSIS — E559 Vitamin D deficiency, unspecified: Secondary | ICD-10-CM

## 2014-03-26 DIAGNOSIS — D508 Other iron deficiency anemias: Secondary | ICD-10-CM

## 2014-03-26 DIAGNOSIS — R5381 Other malaise: Secondary | ICD-10-CM

## 2014-03-26 DIAGNOSIS — R5383 Other fatigue: Secondary | ICD-10-CM

## 2014-03-26 DIAGNOSIS — E785 Hyperlipidemia, unspecified: Secondary | ICD-10-CM

## 2014-03-26 MED ORDER — FLUOXETINE HCL 20 MG PO TABS: 40.0000 mg | ORAL_TABLET | Freq: Every day | ORAL | Status: DC

## 2014-03-26 NOTE — Telephone Encounter (Signed)
Patient called and left voicemail that the Effexor is making her feel tired and drained and wondered if should go back to Prozac, tried to reach patient for more details, No answer patient did say in message does not haven either medication will need refill.

## 2014-03-28 NOTE — Telephone Encounter (Signed)
Spoke with pt and informed of mychart message, verbalized understanding.

## 2014-04-08 ENCOUNTER — Telehealth: Payer: Self-pay | Admitting: Internal Medicine

## 2014-04-09 NOTE — Addendum Note (Signed)
Addended by: [name] on: 04/09/2014 01:06 PM   Modules accepted: Orders

## 2014-04-10 ENCOUNTER — Other Ambulatory Visit: Payer: Self-pay | Admitting: Internal Medicine

## 2014-04-10 DIAGNOSIS — E538 Deficiency of other specified B group vitamins: Secondary | ICD-10-CM

## 2014-04-10 MED ORDER — CLONAZEPAM 1 MG PO TABS: ORAL_TABLET | ORAL | Status: DC

## 2014-04-10 NOTE — Telephone Encounter (Signed)
Refill one 30 days only.

## 2014-04-11 ENCOUNTER — Other Ambulatory Visit: Payer: Self-pay | Admitting: *Deleted

## 2014-04-11 MED ORDER — CLONAZEPAM 1 MG PO TABS: ORAL_TABLET | ORAL | Status: DC

## 2014-04-11 NOTE — Telephone Encounter (Signed)
Refill sent to pharmacy & pt notified via mychart

## 2014-04-16 ENCOUNTER — Other Ambulatory Visit (INDEPENDENT_AMBULATORY_CARE_PROVIDER_SITE_OTHER): Payer: BC Managed Care – PPO

## 2014-04-16 DIAGNOSIS — E559 Vitamin D deficiency, unspecified: Secondary | ICD-10-CM

## 2014-04-16 DIAGNOSIS — E538 Deficiency of other specified B group vitamins: Secondary | ICD-10-CM

## 2014-04-16 DIAGNOSIS — R059 Cough, unspecified: Secondary | ICD-10-CM

## 2014-04-16 DIAGNOSIS — R7309 Other abnormal glucose: Secondary | ICD-10-CM

## 2014-04-16 DIAGNOSIS — R739 Hyperglycemia, unspecified: Secondary | ICD-10-CM

## 2014-04-16 DIAGNOSIS — R5383 Other fatigue: Secondary | ICD-10-CM

## 2014-04-16 DIAGNOSIS — R5381 Other malaise: Secondary | ICD-10-CM

## 2014-04-16 DIAGNOSIS — R05 Cough: Secondary | ICD-10-CM

## 2014-04-16 DIAGNOSIS — D508 Other iron deficiency anemias: Secondary | ICD-10-CM

## 2014-04-16 DIAGNOSIS — E785 Hyperlipidemia, unspecified: Secondary | ICD-10-CM

## 2014-04-16 LAB — CBC WITH DIFFERENTIAL/PLATELET
Basophils Absolute: 0.2 10*3/uL — ABNORMAL HIGH (ref 0.0–0.1)
Basophils Relative: 3.2 % — ABNORMAL HIGH (ref 0.0–3.0)
Eosinophils Absolute: 0.1 10*3/uL (ref 0.0–0.7)
Eosinophils Relative: 1.6 % (ref 0.0–5.0)
HCT: 42.6 % (ref 36.0–46.0)
Hemoglobin: 14 g/dL (ref 12.0–15.0)
Lymphocytes Relative: 31.2 % (ref 12.0–46.0)
Lymphs Abs: 2.2 10*3/uL (ref 0.7–4.0)
MCHC: 32.9 g/dL (ref 30.0–36.0)
MCV: 93.5 fl (ref 78.0–100.0)
Monocytes Absolute: 0.4 10*3/uL (ref 0.1–1.0)
Monocytes Relative: 5.1 % (ref 3.0–12.0)
Neutro Abs: 4.2 10*3/uL (ref 1.4–7.7)
Neutrophils Relative %: 58.9 % (ref 43.0–77.0)
Platelets: 335 10*3/uL (ref 150.0–400.0)
RBC: 4.55 Mil/uL (ref 3.87–5.11)
RDW: 12.9 % (ref 11.5–15.5)
WBC: 7.2 10*3/uL (ref 4.0–10.5)

## 2014-04-16 LAB — IRON AND TIBC
%SAT: 39 % (ref 20–55)
Iron: 133 ug/dL (ref 42–145)
TIBC: 340 ug/dL (ref 250–470)
UIBC: 207 ug/dL (ref 125–400)

## 2014-04-16 LAB — HEMOGLOBIN A1C: Hgb A1c MFr Bld: 5.8 % (ref 4.6–6.5)

## 2014-04-17 LAB — LIPID PANEL
Cholesterol: 231 mg/dL — ABNORMAL HIGH (ref 0–200)
HDL: 43.7 mg/dL (ref >39.00)
LDL Cholesterol: 154 mg/dL — ABNORMAL HIGH (ref 0–99)
NonHDL: 187.3
Total CHOL/HDL Ratio: 5
Triglycerides: 167 mg/dL — ABNORMAL HIGH (ref 0.0–149.0)
VLDL: 33.4 mg/dL (ref 0.0–40.0)

## 2014-04-17 LAB — COMPREHENSIVE METABOLIC PANEL
ALT: 20 U/L (ref 0–35)
AST: 21 U/L (ref 0–37)
Albumin: 3.9 g/dL (ref 3.5–5.2)
Alkaline Phosphatase: 51 U/L (ref 39–117)
BUN: 12 mg/dL (ref 6–23)
CO2: 23 mEq/L (ref 19–32)
Calcium: 9.1 mg/dL (ref 8.4–10.5)
Chloride: 111 mEq/L (ref 96–112)
Creatinine, Ser: 0.9 mg/dL (ref 0.4–1.2)
GFR: 69.85 mL/min (ref >60.00)
Glucose, Bld: 105 mg/dL — ABNORMAL HIGH (ref 70–99)
Potassium: 3.9 mEq/L (ref 3.5–5.1)
Sodium: 138 mEq/L (ref 135–145)
Total Bilirubin: 0.5 mg/dL (ref 0.2–1.2)
Total Protein: 6.6 g/dL (ref 6.0–8.3)

## 2014-04-17 LAB — TSH: TSH: 1.46 u[IU]/mL (ref 0.35–4.50)

## 2014-04-17 LAB — FERRITIN: Ferritin: 94.2 ng/mL (ref 10.0–291.0)

## 2014-04-17 LAB — VITAMIN D 25 HYDROXY (VIT D DEFICIENCY, FRACTURES): VITD: 13.76 ng/mL

## 2014-04-17 LAB — VITAMIN B12: Vitamin B-12: 319 pg/mL (ref 211–911)

## 2014-04-18 ENCOUNTER — Encounter: Payer: Self-pay | Admitting: Internal Medicine

## 2014-04-18 ENCOUNTER — Ambulatory Visit (INDEPENDENT_AMBULATORY_CARE_PROVIDER_SITE_OTHER): Payer: BC Managed Care – PPO | Admitting: Internal Medicine

## 2014-04-18 VITALS — BP 108/70 | HR 85 | Temp 98.8°F | Resp 18 | Ht 62.0 in | Wt 219.2 lb

## 2014-04-18 DIAGNOSIS — F411 Generalized anxiety disorder: Secondary | ICD-10-CM

## 2014-04-18 DIAGNOSIS — K589 Irritable bowel syndrome without diarrhea: Secondary | ICD-10-CM

## 2014-04-18 DIAGNOSIS — E669 Obesity, unspecified: Secondary | ICD-10-CM

## 2014-04-18 DIAGNOSIS — E559 Vitamin D deficiency, unspecified: Secondary | ICD-10-CM

## 2014-04-18 DIAGNOSIS — G43009 Migraine without aura, not intractable, without status migrainosus: Secondary | ICD-10-CM

## 2014-04-18 DIAGNOSIS — K760 Fatty (change of) liver, not elsewhere classified: Secondary | ICD-10-CM

## 2014-04-18 DIAGNOSIS — E785 Hyperlipidemia, unspecified: Secondary | ICD-10-CM

## 2014-04-18 DIAGNOSIS — R7309 Other abnormal glucose: Secondary | ICD-10-CM

## 2014-04-18 DIAGNOSIS — D353 Benign neoplasm of craniopharyngeal duct: Secondary | ICD-10-CM

## 2014-04-18 DIAGNOSIS — Z0000 Encounter for general adult medical examination without abnormal findings: Secondary | ICD-10-CM

## 2014-04-18 DIAGNOSIS — D352 Benign neoplasm of pituitary gland: Secondary | ICD-10-CM

## 2014-04-18 DIAGNOSIS — N644 Mastodynia: Secondary | ICD-10-CM

## 2014-04-18 DIAGNOSIS — K7689 Other specified diseases of liver: Secondary | ICD-10-CM

## 2014-04-18 LAB — B PERTUSSIS IGG/IGM AB
B pertussis IgG Ab: 1.11 index — ABNORMAL HIGH (ref 0.00–0.94)
B pertussis IgM Ab, Quant: <1 index (ref 0.0–0.9)

## 2014-04-18 LAB — POCT URINE PREGNANCY: Preg Test, Ur: NEGATIVE

## 2014-04-18 MED ORDER — KETOROLAC TROMETHAMINE 30 MG/ML IJ SOLN
30.0000 mg | Freq: Once | INTRAMUSCULAR | Status: AC
  Administered 2014-04-18: 30 mg via INTRAVENOUS

## 2014-04-18 MED ORDER — KETOROLAC TROMETHAMINE 30 MG/ML IM SOLN: 30.0000 mg | Freq: Once | INTRAMUSCULAR | Status: DC

## 2014-04-18 MED ORDER — DICYCLOMINE HCL 20 MG PO TABS
20.0000 mg | ORAL_TABLET | Freq: Four times a day (QID) | ORAL | Status: DC
Start: 2014-04-18 — End: 2015-04-25

## 2014-04-18 MED ORDER — PROMETHAZINE HCL 50 MG/ML IJ SOLN
50.0000 mg | Freq: Once | INTRAMUSCULAR | Status: AC
  Administered 2014-04-18: 50 mg via INTRAMUSCULAR

## 2014-04-18 MED ORDER — VITAMIN D (ERGOCALCIFEROL) 1.25 MG (50000 UNIT) PO CAPS: 50000.0000 [IU] | ORAL_CAPSULE | ORAL | Status: DC

## 2014-04-18 MED ORDER — PHENTERMINE HCL 37.5 MG PO TABS: 37.5000 mg | ORAL_TABLET | Freq: Every day | ORAL | Status: DC

## 2014-04-18 NOTE — Patient Instructions (Addendum)
If you want to try phentermine for 3 months   Use either full or half tablet daily in split doses or once daily in AM   Resume 1000 units D3 daily after you finish the megadose    Trial of dicyclomine for IBS symptoms.  Take 1 tablet 20 minutes prior to eating    This is  One version of a  "Low GI"  Diet:  It will still lower your blood sugars and allow you to lose 4 to 8  lbs  per month if you follow it carefully.  Your goal with exercise is a minimum of 30 minutes of aerobic exercise 5 days per week (Walking does not count once it becomes easy!)    All of the foods can be found at grocery stores and in bulk at [name].  The Selena [name] protein bars and shakes are available in more varieties at [employer], [employer] and [name].     7 AM Breakfast:  Choose from the following:  Low carbohydrate Protein  Shakes (I recommend the Selena [name] AdvantEdge "Carb Control" shakes  Or the low carb shakes by Selena [name].    2.5 carbs   Selena [name] "Sandwhich Thin"toasted  w/ peanut butter (no jelly: about 20 net carbs  "[name]" with cream cheese and salmon: about 20 carbs   a scrambled egg/bacon/cheese burrito made with Selena [name]'s "carb balance" whole wheat tortilla  (about 10 net carbs )   Avoid cereal and bananas, oatmeal and cream of wheat and grits. They are loaded with carbohydrates!   10 AM: high protein snack  Protein bar by Selena [name] (the snack size, under 200 cal, usually < 6 net carbs).    A stick of cheese:  Around 1 carb,  100 cal     Selena [name] Light n Fit Selena [name] [name]  (80 cal, 8 carbs)  Other so called "protein bars" and Greek yogurts tend to be loaded with carbohydrates.  Remember, in food advertising, the word "energy" is synonymous for " carbohydrate."  Lunch:   A Sandwich using the bread choices listed, Can use any  Eggs,  lunchmeat, grilled meat or canned tuna), avocado, regular mayo/mustard  and cheese  Selena [name] Lt n Fit  Selena [name] [name] and 2 hard boiled egg whites.  A Salad using Selena cheese, ranch,   Selena [name] or Selena [name],  No croutons or "confetti" and no "candied nuts" but regular nuts OK.   No pretzels or chips.  Pickles and miniature sweet peppers are a good low carb alternative that provide a "crunch"  The bread is the only source of carbohydrate in a sandwich and  can be decreased by trying some of these alternatives to traditional loaf bread  Selena [name] a pita bread and a flat bread that are 50 cal and 4 net carbs available at [facility]ena [name] and [name].  This can be toasted to use with hummous as well  Selena [name] [name] a low carb flatbread that's 100 cal and 9 net carbs available at [name] and Selena [name] [name] 2 sizes of  [name]  (The large one is 210 cal and 6 net carbs) Avoid "Low fat dressings, as well as Selena [name] dressings They are loaded with sugar!   3 PM/ Mid day  Snack:  Consider  1 ounce of  almonds, walnuts, pistachios, pecans, peanuts,  Macadamia nuts or a nut medley.  Avoid "granola"; the dried cranberries and raisins are loaded with carbohydrates. Mixed nuts as long as there  are no raisins,  cranberries or dried fruit.    Try the prosciutto/mozzarella cheese sticks by Selena [name]  In deli /backery section at [name] protein      6 PM  Dinner:     Meat/fowl/fish with a green salad, and either broccoli, cauliflower, green beans, spinach, brussel sprouts or  Lima beans. DO NOT BREAD THE PROTEIN!!      There is a low carb pasta by Selena [name] that is acceptable and tastes great: only 5 digestible carbs/serving.( All grocery stores but [name] carry it )  Try Selena [name]'s chicken piccata or chicken or eggplant parm over low carb pasta.(Selena [name])   Selena [name] "Carnitas" (pulled pork, no sauce,  0 carbs) or his beef pot roast to make a dinner burrito (at [name])  Pesto over low carb pasta ([name] sells a good quality pesto in the center refrigerated section of the deli !  Try sauteeing  Selena [name] with mushroooms  Whole wheat  pasta is still full of digestible carbs and  Not as low in glycemic index as Selena [name].   Brown rice is still rice,  So skip the rice and noodles if you eat [name] (or at least limit to 1/2 cup) Avoid fruit juices   9 PM snack :   Selena [name] "low carb" fudgsicle or  ice cream bar (Carb Smart line), or  Selena [name] ice cream bar , or another "no sugar added" ice cream;  a serving of fresh berries/cherries with whipped cream   Cheese or Selena [name]  8 ounces of Selena [name] unsweetened almond/cococunut milk  (45 cal, 1 carb  433 mg calcium)  Avoid bananas, pineapple, grapes  and watermelon on a regular basis because they are high in sugar.  THINK OF THEM AS DESSERT  Remember that snack Substitutions should be less than 10 NET carbs per serving and meals < 20 carbs. Remember to subtract fiber grams to get the "net carbs."

## 2014-04-18 NOTE — Progress Notes (Signed)
Patient ID: Selena [name], female   DOB: 08-Jan-1966, 48 y.o.   MRN: [id number]   Subjective:     Selena [name] is a 48 y.o. female and is here for a comprehensive physical exam. The patient reports as follows:.  Has been having a migraine for the last 4 days.   relpax taken but did not resolve,  Some phoptophobia and nausea.  Requesting an injection of "whatever they gave me at the other office that knocked it out"  Recurrent loose stools post prandially with cramping.  Chronic,  No warning signs or wt loss .  Prior workup for celiac disease and gastroparesis .  S/p chole.    frustrated at gaining weight despite following a diet .  Has trouble controlled appetite.  Walking 3 times weekly.    History   Social History  . Marital Status: Married    Spouse Name: N/A    Number of Children: 1  . Years of Education: N/A   Occupational History  . Teaches preschool    Social History Main Topics  . Smoking status: Never Smoker   . Smokeless tobacco: Never Used  . Alcohol Use: Yes     Comment: 4 Times a month  . Drug Use: No  . Sexual Activity: Yes    Birth Control/ Protection: IUD   Other Topics Concern  . Not on file   Social History Narrative   2 Step children; asthma, ADHD   Health Maintenance  Topic Date Due  . Mammogram  04/06/2014  . Influenza Vaccine  05/05/2014  . Pap Smear  04/06/2016  . Colonoscopy  02/07/2018  . Tetanus/tdap  07/05/2018    The following portions of the patient's history were reviewed and updated as appropriate: allergies, current medications, past family history, past medical history, past social history, past surgical history and problem list.  Review of Systems A comprehensive review of systems was negative.   Objective:   BP 108/70  Pulse 85  Temp(Src) 98.8 F (37.1 C) (Oral)  Resp 18  Ht 5\' 2"  (1.575 m)  Wt 219 lb 4 oz (99.451 kg)  BMI 40.09 kg/m2  SpO2 98%  General appearance: alert, cooperative and appears stated  age Ears: normal TM's and external ear canals both ears Throat: lips, mucosa, and tongue normal; teeth and gums normal Neck: no adenopathy, no carotid bruit, supple, symmetrical, trachea midline and thyroid not enlarged, symmetric, no tenderness/mass/nodules Back: symmetric, no curvature. ROM normal. No CVA tenderness. Lungs: clear to auscultation bilaterally Heart: regular rate and rhythm, S1, S2 normal, no murmur, click, rub or gallop Abdomen: soft, non-tender; bowel sounds normal; no masses,  no organomegaly Pulses: 2+ and symmetric Skin: Skin color, texture, turgor normal. No rashes or lesions Lymph nodes: Cervical, supraclavicular, and axillary nodes normal.  .    Assessment and plan:   Fatty liver Secondary to obesity, sedentary lifestyle, and American diet. I have  Hepatic enzymes are normalized and she has no signs of cirrhosis or synthetic dysfunction .  continue low glycemicc index diet, weight loss with goal BMI < 30 , start participating in regular exercise a minimum of 5 days per week.    PREDIABETES Fasting glucoses are < 130 and awc < 6.0  Wt loss and exercise /low GI diet advised and handout given. Lab Results  Component Value Date   HGBA1C 5.8 04/16/2014     OBESITY, UNSPECIFIED I have addressed  BMI and recommended a low glycemic index diet utilizing smaller more frequent meals to  increase metabolism.  I have also recommended that patient start exercising with a goal of 30 minutes of aerobic exercise a minimum of 5 days per week. Screening for lipid disorders, thyroid and diabetes to be done today.    IBS (irritable bowel syndrome) Suggested by history of post prandial cramping and loose stools in a patient who is s/p cholecystectomy and not losing weight.  Trial of dicyclomine.   Other and unspecified hyperlipidemia Mild, no indication at her age to start statin therapy.  LDL is < 160 and there is no personal history of htn or DM  COMMON MIGRAINE Not taking  topirimate as it was ineffective.  Today's migtine has lasted 4-5 days .  Ketorolac and phentermine given today in office. .  VS are normal,    Unspecified vitamin D deficiency Level < 20.  Rx Drisdol 50K weekly x 12 weeks, then daily 1000 units   PITUITARY ADENOMA, BENIGN Repeat MRI of brain showed no evidence of pituitary microadenoma that was previously suggested.  Encounter for preventive health examination Annual comprehensive exam was done excluding breast, pelvic and PAP smear. All screenings have been addressed .   Generalized anxiety disorder Managed with clonazepam and prozac.  No changes today    Updated Medication List Outpatient Encounter Prescriptions as of 04/18/2014  Medication Sig  . cetirizine (ZYRTEC) 10 MG tablet Take 10 mg by mouth daily.    . clonazePAM (KLONOPIN) 1 MG tablet TAKE 1 TABLET BY MOUTH AT BEDTIME AND HALF A TABLET IN THE MORNING  . diclofenac (VOLTAREN) 75 MG EC tablet Take 1 tablet (75 mg total) by mouth 2 (two) times daily.  Marland Kitchen FLUoxetine (PROZAC) 20 MG tablet Take 2 tablets (40 mg total) by mouth daily.  . fluticasone (FLONASE) 50 MCG/ACT nasal spray Place 2 sprays into the nose daily.  Marland Kitchen Hyoscyamine Sulfate 0.375 MG TBCR Take one tablet by mouth twice daily for 4--5 days and then as needed  . levonorgestrel (MIRENA) 20 MCG/24HR IUD 1 Intra Uterine Device (1 each total) by Intrauterine route once.  Marland Kitchen omeprazole (PRILOSEC) 20 MG capsule TAKE 1 TABLET (20 MG TOTAL) BY MOUTH DAILY.  . rizatriptan (MAXALT) 10 MG tablet TAKE 1 TABLET (10 MG TOTAL) BY MOUTH AS NEEDED FOR MIGRAINE. MAY REPEAT IN 2 HOURS IF NEEDED  . tiZANidine (ZANAFLEX) 4 MG tablet TAKE 1 TABLET (4 MG TOTAL) BY MOUTH EVERY 6 (SIX) HOURS AS NEEDED.  Marland Kitchen topiramate (TOPAMAX) 50 MG tablet TAKE 2 TABLETS (100 MG TOTAL) BY MOUTH DAILY. AT BEDTIME FOR MIGRAINE PREVENTION  . albuterol (PROVENTIL HFA;VENTOLIN HFA) 108 (90 BASE) MCG/ACT inhaler Inhale 2 puffs into the lungs every 6 (six) hours as  needed for wheezing or shortness of breath.  . dicyclomine (BENTYL) 20 MG tablet Take 1 tablet (20 mg total) by mouth every 6 (six) hours.  . phentermine (ADIPEX-P) 37.5 MG tablet Take 1 tablet (37.5 mg total) by mouth daily before breakfast.  . Vitamin D, Ergocalciferol, (DRISDOL) 50000 UNITS CAPS capsule Take 1 capsule (50,000 Units total) by mouth every 7 (seven) days.  . [DISCONTINUED] benzonatate (TESSALON) 100 MG capsule Take 1 capsule (100 mg total) by mouth 2 (two) times daily as needed for cough.  . [DISCONTINUED] doxycycline (VIBRAMYCIN) 100 MG capsule Take 1 capsule (100 mg total) by mouth 2 (two) times daily.  . [DISCONTINUED] fluconazole (DIFLUCAN) 150 MG tablet Take 1 tablet (150 mg total) by mouth once.  . [DISCONTINUED] fluticasone (FLOVENT HFA) 44 MCG/ACT inhaler Inhale 2 puffs into the  lungs 2 (two) times daily.  . [DISCONTINUED] HYDROcodone-homatropine (HYCODAN) 5-1.5 MG/5ML syrup Take 5 mLs by mouth every 8 (eight) hours as needed for cough.  . [DISCONTINUED] ketorolac (TORADOL) 30 MG/ML injection Inject 1 mL (30 mg total) into the muscle once.  . [DISCONTINUED] nortriptyline (PAMELOR) 10 MG capsule Take 1 capsule (10 mg total) by mouth at bedtime.  . [EXPIRED] ketorolac (TORADOL) 30 MG/ML injection 30 mg   . [EXPIRED] promethazine (PHENERGAN) injection 50 mg

## 2014-04-18 NOTE — Progress Notes (Signed)
Pre-visit discussion using our clinic review tool. No additional management support is needed unless otherwise documented below in the visit note.  

## 2014-04-21 DIAGNOSIS — E559 Vitamin D deficiency, unspecified: Secondary | ICD-10-CM | POA: Insufficient documentation

## 2014-04-21 DIAGNOSIS — E785 Hyperlipidemia, unspecified: Secondary | ICD-10-CM | POA: Insufficient documentation

## 2014-04-21 DIAGNOSIS — K589 Irritable bowel syndrome without diarrhea: Secondary | ICD-10-CM | POA: Insufficient documentation

## 2014-04-21 DIAGNOSIS — Z01818 Encounter for other preprocedural examination: Secondary | ICD-10-CM | POA: Insufficient documentation

## 2014-04-21 DIAGNOSIS — Z0000 Encounter for general adult medical examination without abnormal findings: Secondary | ICD-10-CM | POA: Insufficient documentation

## 2014-04-21 NOTE — Assessment & Plan Note (Signed)
Fasting glucoses are < 130 and awc < 6.0  Wt loss and exercise /low GI diet advised and handout given. Lab Results  Component Value Date   HGBA1C 5.8 04/16/2014

## 2014-04-21 NOTE — Assessment & Plan Note (Signed)
Annual comprehensive exam was done excluding breast, pelvic and PAP smear. All screenings have been addressed .  

## 2014-04-21 NOTE — Assessment & Plan Note (Signed)
I have addressed  BMI and recommended a low glycemic index diet utilizing smaller more frequent meals to increase metabolism.  I have also recommended that patient start exercising with a goal of 30 minutes of aerobic exercise a minimum of 5 days per week. Screening for lipid disorders, thyroid and diabetes to be done today.   

## 2014-04-21 NOTE — Assessment & Plan Note (Signed)
Mild, no indication at her age to start statin therapy.  LDL is < 160 and there is no personal history of htn or DM

## 2014-04-21 NOTE — Assessment & Plan Note (Addendum)
Secondary to obesity, sedentary lifestyle, and American diet. I have  Hepatic enzymes are normalized and she has no signs of cirrhosis or synthetic dysfunction .  continue low glycemicc index diet, weight loss with goal BMI < 30 , start participating in regular exercise a minimum of 5 days per week.

## 2014-04-21 NOTE — Assessment & Plan Note (Addendum)
Managed with clonazepam and prozac.  No changes today

## 2014-04-21 NOTE — Assessment & Plan Note (Signed)
Suggested by history of post prandial cramping and loose stools in a patient who is s/p cholecystectomy and not losing weight.  Trial of dicyclomine.

## 2014-04-21 NOTE — Assessment & Plan Note (Signed)
Repeat MRI of brain showed no evidence of pituitary microadenoma that was previously suggested.

## 2014-04-21 NOTE — Assessment & Plan Note (Signed)
Level < 20.  Rx Drisdol 50K weekly x 12 weeks, then daily 1000 units

## 2014-04-21 NOTE — Assessment & Plan Note (Addendum)
Not taking topirimate as it was ineffective.  Today's migtine has lasted 4-5 days .  Ketorolac and phentermine given today in office. .  VS are normal,

## 2014-04-24 ENCOUNTER — Other Ambulatory Visit: Payer: Self-pay | Admitting: Internal Medicine

## 2014-05-03 ENCOUNTER — Other Ambulatory Visit: Payer: Self-pay | Admitting: Internal Medicine

## 2014-05-22 ENCOUNTER — Other Ambulatory Visit: Payer: Self-pay

## 2014-05-23 LAB — CYTOLOGY - PAP

## 2014-05-28 ENCOUNTER — Other Ambulatory Visit: Payer: Self-pay | Admitting: Internal Medicine

## 2014-05-28 NOTE — Telephone Encounter (Signed)
Last refill 7.8.15, last OV 7.15.15.  Please advise refill

## 2014-05-30 NOTE — Telephone Encounter (Signed)
Rx faxed

## 2014-05-30 NOTE — Telephone Encounter (Signed)
Ok to refill,  printed rx  

## 2014-06-07 ENCOUNTER — Telehealth: Payer: Self-pay | Admitting: *Deleted

## 2014-06-07 NOTE — Telephone Encounter (Signed)
OMEPRAZOLE APPROVED UNTIL 05/28/2015

## 2014-06-14 ENCOUNTER — Encounter: Payer: Self-pay | Admitting: Internal Medicine

## 2014-06-14 DIAGNOSIS — R197 Diarrhea, unspecified: Secondary | ICD-10-CM

## 2014-06-14 DIAGNOSIS — L749 Eccrine sweat disorder, unspecified: Secondary | ICD-10-CM

## 2014-06-15 MED ORDER — CLONAZEPAM 1 MG PO TABS: ORAL_TABLET | ORAL | Status: DC

## 2014-06-15 NOTE — Telephone Encounter (Signed)
Rx faxed to pharmacy  

## 2014-06-20 ENCOUNTER — Other Ambulatory Visit: Payer: Self-pay | Admitting: Internal Medicine

## 2014-06-20 ENCOUNTER — Other Ambulatory Visit: Payer: Self-pay | Admitting: *Deleted

## 2014-07-12 ENCOUNTER — Other Ambulatory Visit: Payer: Self-pay | Admitting: Internal Medicine

## 2014-07-12 NOTE — Telephone Encounter (Signed)
Last refill 8.26.15, last OV 7.15.15.  Please advise refill.

## 2014-07-13 NOTE — Telephone Encounter (Signed)
Ok to refill,  printed rx  

## 2014-07-13 NOTE — Telephone Encounter (Signed)
Rx faxed

## 2014-07-15 ENCOUNTER — Other Ambulatory Visit: Payer: Self-pay | Admitting: Internal Medicine

## 2014-07-20 ENCOUNTER — Other Ambulatory Visit: Payer: Self-pay

## 2014-07-23 ENCOUNTER — Other Ambulatory Visit: Payer: Self-pay | Admitting: Internal Medicine

## 2014-08-03 ENCOUNTER — Telehealth: Payer: Self-pay | Admitting: *Deleted

## 2014-08-03 NOTE — Telephone Encounter (Signed)
Pt called saying she got a bill from [facility] 07.15.2015, they only I see in her chart is a urine pregnancy test for that date, I gave her Cheryll Cockayne number [phone]) since i didn't even see any results of the UDS for 07.15.2015

## 2014-08-05 NOTE — Telephone Encounter (Signed)
Tell patient to ignore any bills from [facility] for a urine drug screen

## 2014-08-06 NOTE — Telephone Encounter (Signed)
Sent mychart message

## 2014-09-05 ENCOUNTER — Encounter: Payer: Self-pay | Admitting: Internal Medicine

## 2014-09-10 MED ORDER — DIAZEPAM 10 MG PO TABS: 10.0000 mg | ORAL_TABLET | Freq: Every evening | ORAL | Status: DC | PRN

## 2014-09-16 ENCOUNTER — Other Ambulatory Visit: Payer: Self-pay | Admitting: Internal Medicine

## 2014-09-25 ENCOUNTER — Encounter: Payer: Self-pay | Admitting: Internal Medicine

## 2014-09-25 ENCOUNTER — Ambulatory Visit (INDEPENDENT_AMBULATORY_CARE_PROVIDER_SITE_OTHER): Payer: BC Managed Care – PPO | Admitting: Internal Medicine

## 2014-09-25 VITALS — BP 110/84 | HR 74 | Temp 98.4°F | Wt 204.0 lb

## 2014-09-25 DIAGNOSIS — R197 Diarrhea, unspecified: Secondary | ICD-10-CM

## 2014-09-25 DIAGNOSIS — R1032 Left lower quadrant pain: Secondary | ICD-10-CM

## 2014-09-25 DIAGNOSIS — R1110 Vomiting, unspecified: Secondary | ICD-10-CM

## 2014-09-25 MED ORDER — DIPHENOXYLATE-ATROPINE 2.5-0.025 MG PO TABS: 1.0000 | ORAL_TABLET | Freq: Four times a day (QID) | ORAL | Status: DC | PRN

## 2014-09-25 NOTE — Progress Notes (Signed)
Pre visit review using our clinic review tool, if applicable. No additional management support is needed unless otherwise documented below in the visit note. 

## 2014-09-25 NOTE — Patient Instructions (Signed)

## 2014-09-25 NOTE — Progress Notes (Signed)
Subjective:    Patient ID: Selena [name], female    DOB: [birth year], 48 y.o.   MRN: [id number]  HPI  Pt presents to the clinic today with c/o nausea and abdominal pain. She reports this started 3 days ago. She has had some associated diarrhea. She did vomit x 1. She has not seen any blood in her stool or in her emesis. She was concerned that she may have gotten food poisoning from [place]. She has not had any change in her diet. She does have a history of IBS. She did have a colonoscopy 2014 which did not show any diverticulosis.  Review of Systems      Past Medical History  Diagnosis Date  . Anemia, iron deficiency   . Depression   . Hyperlipidemia   . Hypertension   . Anxiety   . Gallstones   . Fibromyalgia   . Asthma   . Eating disorder     Binge eating  . Colon polyps 2014    Current Outpatient Prescriptions  Medication Sig Dispense Refill  . albuterol (PROVENTIL HFA;VENTOLIN HFA) 108 (90 BASE) MCG/ACT inhaler Inhale 2 puffs into the lungs every 6 (six) hours as needed for wheezing or shortness of breath. 1 Inhaler 0  . cetirizine (ZYRTEC) 10 MG tablet Take 10 mg by mouth daily.      . clonazePAM (KLONOPIN) 1 MG tablet TAKE 1 TABLET BY MOUTH AT BEDTIME AND 1/2 TABLET IN THE MORNING 45 tablet 2  . diazepam (VALIUM) 10 MG tablet Take 1 tablet (10 mg total) by mouth at bedtime as needed for anxiety. 30 tablet 1  . diclofenac (VOLTAREN) 75 MG EC tablet Take 1 tablet (75 mg total) by mouth 2 (two) times daily. 60 tablet 3  . dicyclomine (BENTYL) 20 MG tablet Take 1 tablet (20 mg total) by mouth every 6 (six) hours. 120 tablet 3  . FLUoxetine (PROZAC) 20 MG tablet TAKE 2 TABLETS BY MOUTH DAILY. 60 tablet 2  . fluticasone (FLONASE) 50 MCG/ACT nasal spray Place 2 sprays into the nose daily. 16 g 6  . Hyoscyamine Sulfate 0.375 MG TBCR Take one tablet by mouth twice daily for 4--5 days and then as needed 25 tablet 1  . levonorgestrel (MIRENA) 20 MCG/24HR IUD 1 Intra Uterine  Device (1 each total) by Intrauterine route once. 1 each 0  . omeprazole (PRILOSEC) 20 MG capsule TAKE 1 TABLET (20 MG TOTAL) BY MOUTH DAILY. 30 capsule 11  . rizatriptan (MAXALT) 10 MG tablet TAKE 1 TABLET (10 MG TOTAL) BY MOUTH AS NEEDED FOR MIGRAINE. MAY REPEAT IN 2 HOURS IF NEEDED 10 tablet 5  . rizatriptan (MAXALT) 10 MG tablet TAKE 1 TABLET (10 MG TOTAL) BY MOUTH AS NEEDED FOR MIGRAINE. MAY REPEAT IN 2 HOURS IF NEEDED 10 tablet 6  . topiramate (TOPAMAX) 50 MG tablet TAKE 2 TABLETS (100 MG TOTAL) BY MOUTH DAILY. AT BEDTIME FOR MIGRAINE PREVENTION 60 tablet 2  . phentermine (ADIPEX-P) 37.5 MG tablet Take 1 tablet (37.5 mg total) by mouth daily before breakfast. (Patient not taking: Reported on 09/25/2014) 30 tablet 2  . [DISCONTINUED] omeprazole (PRILOSEC OTC) 20 MG tablet Take 1 tablet (20 mg total) by mouth daily. 30 tablet 11   No current facility-administered medications for this visit.    Allergies  Allergen Reactions  . Sulfonamide Derivatives     REACTION: Itchy  . Topamax [Topiramate]     REACTION: bad reaction. put in hospital/chest pain    Family History  Problem  Relation Age of Onset  . Hyperlipidemia Mother   . Heart disease Mother     Atrial fibrilation  . Cancer Mother     ? Melanoma  . Diabetes Father   . Mental illness Father     Bipolar  . Cancer Sister     Clear cell sarcoma in leg  . Hypothyroidism Sister   . Hypertension Maternal Grandmother   . Heart disease Maternal Grandmother     Afib  . Arthritis Paternal Grandmother   . Colon cancer Neg Hx   . Rectal cancer Neg Hx     History   Social History  . Marital Status: Married    Spouse Name: N/A    Number of Children: 1  . Years of Education: N/A   Occupational History  . Teaches preschool    Social History Main Topics  . Smoking status: Never Smoker   . Smokeless tobacco: Never Used  . Alcohol Use: Yes     Comment: 4 Times a month  . Drug Use: No  . Sexual Activity: Yes    Birth  Control/ Protection: IUD   Other Topics Concern  . Not on file   Social History Narrative   2 Step children; asthma, ADHD     Constitutional: Denies fever, malaise, fatigue, headache or abrupt weight changes.  Respiratory: Denies difficulty breathing, shortness of breath, cough or sputum production.   Cardiovascular: Denies chest pain, chest tightness, palpitations or swelling in the hands or feet.  Gastrointestinal: Pt reports nausea, vomiting, abdominal pain and diarrhea. Denies bloating, constipation, or blood in the stool.  GU: Denies urgency, frequency, pain with urination, burning sensation, blood in urine, odor or discharge.  No other specific complaints in a complete review of systems (except as listed in HPI above).  Objective:   Physical Exam  BP 110/84 mmHg  Pulse 74  Temp(Src) 98.4 F (36.9 C) (Oral)  Wt 204 lb (92.534 kg)  SpO2 99% Wt Readings from Last 3 Encounters:  09/25/14 204 lb (92.534 kg)  04/18/14 219 lb 4 oz (99.451 kg)  02/03/14 215 lb (97.523 kg)    General: Appears her stated age, obese but well developed, well nourished in NAD.  Cardiovascular: Normal rate and rhythm. S1,S2 noted.  No murmur, rubs or gallops noted.  Pulmonary/Chest: Normal effort and positive vesicular breath sounds. No respiratory distress. No wheezes, rales or ronchi noted.  Abdomen: Soft and tender in the LLQ. Normal bowel sounds, no bruits noted. No distention or masses noted. Liver, spleen and kidneys non palpable.  BMET    Component Value Date/Time   NA 138 04/16/2014 0824   K 3.9 04/16/2014 0824   CL 111 04/16/2014 0824   CO2 23 04/16/2014 0824   GLUCOSE 105* 04/16/2014 0824   BUN 12 04/16/2014 0824   CREATININE 0.9 04/16/2014 0824   CREATININE 0.96 06/17/2012 1638   CALCIUM 9.1 04/16/2014 0824   GFRNONAA 82.41 04/18/2010 0902   GFRAA 79 10/23/2008 0957    Lipid Panel     Component Value Date/Time   CHOL 231* 04/16/2014 0824   TRIG 167.0* 04/16/2014 0824    HDL 43.70 04/16/2014 0824   CHOLHDL 5 04/16/2014 0824   VLDL 33.4 04/16/2014 0824   LDLCALC 154* 04/16/2014 0824    CBC    Component Value Date/Time   WBC 7.2 04/16/2014 0824   RBC 4.55 04/16/2014 0824   HGB 14.0 04/16/2014 0824   HCT 42.6 04/16/2014 0824   PLT 335.0 04/16/2014 0824  MCV 93.5 04/16/2014 0824   MCH 30.8 06/17/2012 1638   MCHC 32.9 04/16/2014 0824   RDW 12.9 04/16/2014 0824   LYMPHSABS 2.2 04/16/2014 0824   MONOABS 0.4 04/16/2014 0824   EOSABS 0.1 04/16/2014 0824   BASOSABS 0.2* 04/16/2014 0824    Hgb A1C Lab Results  Component Value Date   HGBA1C 5.8 04/16/2014         Assessment & Plan:   Abdominal pain and diarrhea:  She had no evidence of diverticulosis on her last colonoscopy ? Viral etiology Advised her to drink plenty of fluids and eat bland foods She declines RX for nausea medication Start taking your Bentyl for the next few days RX for lomotil  RTC as needed or if symptoms persist or worsen

## 2014-10-04 ENCOUNTER — Other Ambulatory Visit: Payer: Self-pay | Admitting: Internal Medicine

## 2014-11-29 ENCOUNTER — Encounter: Payer: Self-pay | Admitting: Internal Medicine

## 2014-12-08 ENCOUNTER — Other Ambulatory Visit: Payer: Self-pay | Admitting: Internal Medicine

## 2014-12-19 ENCOUNTER — Encounter: Payer: Self-pay | Admitting: Internal Medicine

## 2014-12-19 ENCOUNTER — Ambulatory Visit (INDEPENDENT_AMBULATORY_CARE_PROVIDER_SITE_OTHER): Payer: BC Managed Care – PPO | Admitting: Internal Medicine

## 2014-12-19 VITALS — BP 118/78 | HR 86 | Temp 98.4°F | Resp 16 | Ht 62.0 in | Wt 204.5 lb

## 2014-12-19 DIAGNOSIS — F419 Anxiety disorder, unspecified: Secondary | ICD-10-CM

## 2014-12-19 DIAGNOSIS — G4763 Sleep related bruxism: Secondary | ICD-10-CM

## 2014-12-19 DIAGNOSIS — G4710 Hypersomnia, unspecified: Secondary | ICD-10-CM

## 2014-12-19 DIAGNOSIS — F5105 Insomnia due to other mental disorder: Secondary | ICD-10-CM

## 2014-12-19 DIAGNOSIS — M542 Cervicalgia: Secondary | ICD-10-CM

## 2014-12-19 NOTE — Patient Instructions (Addendum)
Stop using the  diazepam  In the evening  is too sedating i combination with the clonazepam  You can resume the tizanidine if needed for muscle spasm and teeth grinding  Let me know in a week if your symptoms have improved  Let you son know how much you need AND love him!

## 2014-12-19 NOTE — Assessment & Plan Note (Signed)
Has a night
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
## 2014-12-22 DIAGNOSIS — G4700 Insomnia, unspecified: Secondary | ICD-10-CM | POA: Insufficient documentation

## 2014-12-22 DIAGNOSIS — G4710 Hypersomnia, unspecified: Secondary | ICD-10-CM | POA: Insufficient documentation

## 2014-12-22 DIAGNOSIS — F419 Anxiety disorder, unspecified: Secondary | ICD-10-CM | POA: Insufficient documentation

## 2014-12-22 DIAGNOSIS — F5105 Insomnia due to other mental disorder: Secondary | ICD-10-CM

## 2014-12-22 NOTE — Assessment & Plan Note (Signed)
Advised to resume tizanidine for muscle spasm and tension headaches

## 2014-12-22 NOTE — Assessment & Plan Note (Signed)
Secondary to oversedation with clonazepam and diazepam. The risks and benefits of benzodiazepine use were discussed with patient today including excessive sedation leading to respiratory depression,  impaired thinking/driving, and addiction.  Patient was advised to avoid concurrent use of clonazepam and diazepam  to use medication only as needed and not to share with others  .

## 2014-12-22 NOTE — Assessment & Plan Note (Signed)
Recommended using only clonazepam for insomnia.  May need  To increase dose to 1 mg or add medication for RLS.

## 2014-12-24 ENCOUNTER — Encounter: Payer: Self-pay | Admitting: Internal Medicine

## 2014-12-25 ENCOUNTER — Other Ambulatory Visit: Payer: Self-pay | Admitting: Internal Medicine

## 2014-12-26 NOTE — Telephone Encounter (Signed)
Unread message, pt was seen in clinic 12/19/14

## 2015-01-25 ENCOUNTER — Encounter: Payer: Self-pay | Admitting: Internal Medicine

## 2015-01-28 MED ORDER — TIZANIDINE HCL 4 MG PO TABS: 2.0000 mg | ORAL_TABLET | Freq: Three times a day (TID) | ORAL | Status: DC | PRN

## 2015-01-28 NOTE — Telephone Encounter (Signed)
OK to add

## 2015-02-12 ENCOUNTER — Encounter: Payer: Self-pay | Admitting: Internal Medicine

## 2015-02-14 MED ORDER — DIAZEPAM 10 MG PO TABS: 10.0000 mg | ORAL_TABLET | Freq: Two times a day (BID) | ORAL | Status: DC | PRN

## 2015-02-15 NOTE — Telephone Encounter (Signed)
Faxed to pharmacy

## 2015-03-07 ENCOUNTER — Other Ambulatory Visit: Payer: Self-pay | Admitting: Internal Medicine

## 2015-03-07 NOTE — Telephone Encounter (Signed)
Last OV 3.16.16, last refill 4.4.16.  Please advise refill

## 2015-03-08 NOTE — Telephone Encounter (Signed)
Ok to refill,  printed rx .  I authorized you to call it in if she is going to run out over weekend.

## 2015-03-17 ENCOUNTER — Other Ambulatory Visit: Payer: Self-pay | Admitting: Internal Medicine

## 2015-04-21 ENCOUNTER — Other Ambulatory Visit: Payer: Self-pay | Admitting: Internal Medicine

## 2015-04-25 ENCOUNTER — Encounter: Payer: Self-pay | Admitting: Internal Medicine

## 2015-04-25 ENCOUNTER — Ambulatory Visit (INDEPENDENT_AMBULATORY_CARE_PROVIDER_SITE_OTHER): Payer: BC Managed Care – PPO | Admitting: Internal Medicine

## 2015-04-25 VITALS — BP 108/78 | HR 81 | Temp 98.5°F | Resp 12 | Ht 62.0 in | Wt 209.5 lb

## 2015-04-25 DIAGNOSIS — G2581 Restless legs syndrome: Secondary | ICD-10-CM | POA: Diagnosis not present

## 2015-04-25 DIAGNOSIS — E785 Hyperlipidemia, unspecified: Secondary | ICD-10-CM

## 2015-04-25 DIAGNOSIS — G4710 Hypersomnia, unspecified: Secondary | ICD-10-CM

## 2015-04-25 DIAGNOSIS — E559 Vitamin D deficiency, unspecified: Secondary | ICD-10-CM

## 2015-04-25 DIAGNOSIS — R5383 Other fatigue: Secondary | ICD-10-CM | POA: Diagnosis not present

## 2015-04-25 MED ORDER — ROPINIROLE HCL 0.25 MG PO TABS: 0.2500 mg | ORAL_TABLET | Freq: Every day | ORAL | Status: DC
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
## 2015-04-25 NOTE — Patient Instructions (Signed)
Reduce topomax to 50 mg   suspend the tizanidine  Start the ropinirole tonight and after one week increase to 2 tablets at bedtime if legs still restless  E mail me in 2 weeks

## 2015-04-25 NOTE — Progress Notes (Signed)
Pre-visit discussion using our clinic review tool. No additional management support is needed unless otherwise documented below in the visit note.  

## 2015-04-26 ENCOUNTER — Other Ambulatory Visit (INDEPENDENT_AMBULATORY_CARE_PROVIDER_SITE_OTHER): Payer: BC Managed Care – PPO

## 2015-04-26 DIAGNOSIS — R7989 Other specified abnormal findings of blood chemistry: Secondary | ICD-10-CM

## 2015-04-26 DIAGNOSIS — R5383 Other fatigue: Secondary | ICD-10-CM

## 2015-04-26 DIAGNOSIS — E559 Vitamin D deficiency, unspecified: Secondary | ICD-10-CM | POA: Diagnosis not present

## 2015-04-26 DIAGNOSIS — E785 Hyperlipidemia, unspecified: Secondary | ICD-10-CM

## 2015-04-26 DIAGNOSIS — G2581 Restless legs syndrome: Secondary | ICD-10-CM | POA: Diagnosis not present

## 2015-04-26 LAB — LIPID PANEL
Cholesterol: 229 mg/dL — ABNORMAL HIGH (ref 0–200)
HDL: 44.7 mg/dL (ref >39.00)
NonHDL: 184.3
Total CHOL/HDL Ratio: 5
Triglycerides: 212 mg/dL — ABNORMAL HIGH (ref 0.0–149.0)
VLDL: 42.4 mg/dL — ABNORMAL HIGH (ref 0.0–40.0)

## 2015-04-26 LAB — COMPREHENSIVE METABOLIC PANEL
ALT: 24 U/L (ref 0–35)
AST: 18 U/L (ref 0–37)
Albumin: 4.1 g/dL (ref 3.5–5.2)
Alkaline Phosphatase: 45 U/L (ref 39–117)
BUN: 11 mg/dL (ref 6–23)
CO2: 22 mEq/L (ref 19–32)
Calcium: 9 mg/dL (ref 8.4–10.5)
Chloride: 106 mEq/L (ref 96–112)
Creatinine, Ser: 0.78 mg/dL (ref 0.40–1.20)
GFR: 84.13 mL/min (ref >60.00)
Glucose, Bld: 98 mg/dL (ref 70–99)
Potassium: 3.9 mEq/L (ref 3.5–5.1)
Sodium: 136 mEq/L (ref 135–145)
Total Bilirubin: 0.5 mg/dL (ref 0.2–1.2)
Total Protein: 6.3 g/dL (ref 6.0–8.3)

## 2015-04-26 LAB — CBC WITH DIFFERENTIAL/PLATELET
Basophils Absolute: 0.1 10*3/uL (ref 0.0–0.1)
Basophils Relative: 0.8 % (ref 0.0–3.0)
Eosinophils Absolute: 0.1 10*3/uL (ref 0.0–0.7)
Eosinophils Relative: 1.4 % (ref 0.0–5.0)
HCT: 42.4 % (ref 36.0–46.0)
Hemoglobin: 14.3 g/dL (ref 12.0–15.0)
Lymphocytes Relative: 32 % (ref 12.0–46.0)
Lymphs Abs: 2.4 10*3/uL (ref 0.7–4.0)
MCHC: 33.6 g/dL (ref 30.0–36.0)
MCV: 93.4 fl (ref 78.0–100.0)
Monocytes Absolute: 0.5 10*3/uL (ref 0.1–1.0)
Monocytes Relative: 6.2 % (ref 3.0–12.0)
Neutro Abs: 4.4 10*3/uL (ref 1.4–7.7)
Neutrophils Relative %: 59.6 % (ref 43.0–77.0)
Platelets: 306 10*3/uL (ref 150.0–400.0)
RBC: 4.54 Mil/uL (ref 3.87–5.11)
RDW: 12.7 % (ref 11.5–15.5)
WBC: 7.4 10*3/uL (ref 4.0–10.5)

## 2015-04-26 LAB — TSH: TSH: 1.23 u[IU]/mL (ref 0.35–4.50)

## 2015-04-26 LAB — VITAMIN D 25 HYDROXY (VIT D DEFICIENCY, FRACTURES): VITD: 16.96 ng/mL — ABNORMAL LOW (ref 30.00–100.00)

## 2015-04-26 LAB — IRON AND TIBC
%SAT: 40 % (ref 20–55)
Iron: 131 ug/dL (ref 42–145)
TIBC: 324 ug/dL (ref 250–470)
UIBC: 193 ug/dL (ref 125–400)

## 2015-04-26 LAB — LDL CHOLESTEROL, DIRECT: Direct LDL: 158 mg/dL

## 2015-04-26 LAB — VITAMIN B12: Vitamin B-12: 344 pg/mL (ref 211–911)

## 2015-04-27 DIAGNOSIS — G2581 Restless legs syndrome: Secondary | ICD-10-CM | POA: Insufficient documentation

## 2015-04-27 NOTE — Assessment & Plan Note (Addendum)
.  she continues to gain weight due to poor diet and lack of exercise.  There is no sign of underactive thyroid.   Lab Results  Component Value Date   TSH 1.23 04/26/2015

## 2015-04-27 NOTE — Assessment & Plan Note (Signed)
sleep study negative for OSA, positive for RLS. Marland Kitchen  Suspending the tizanidine/

## 2015-04-27 NOTE — Assessment & Plan Note (Signed)
Trial of requip.  Reducing topomax

## 2015-04-29 ENCOUNTER — Encounter: Payer: Self-pay | Admitting: Internal Medicine

## 2015-04-29 MED ORDER — ERGOCALCIFEROL 1.25 MG (50000 UT) PO CAPS: 50000.0000 [IU] | ORAL_CAPSULE | ORAL | Status: DC

## 2015-04-30 NOTE — Telephone Encounter (Signed)
FYI

## 2015-04-30 NOTE — Telephone Encounter (Signed)
Please advise 

## 2015-05-01 ENCOUNTER — Other Ambulatory Visit: Payer: Self-pay | Admitting: Internal Medicine

## 2015-05-01 MED ORDER — ROPINIROLE HCL 2 MG PO TABS: 2.0000 mg | ORAL_TABLET | Freq: Every day | ORAL | Status: DC

## 2015-05-06 ENCOUNTER — Encounter: Payer: Self-pay | Admitting: Internal Medicine

## 2015-05-19 ENCOUNTER — Other Ambulatory Visit: Payer: Self-pay | Admitting: Internal Medicine

## 2015-05-24 ENCOUNTER — Other Ambulatory Visit: Payer: Self-pay

## 2015-05-24 ENCOUNTER — Encounter: Payer: Self-pay | Admitting: Internal Medicine

## 2015-05-25 ENCOUNTER — Encounter: Payer: Self-pay | Admitting: Family Medicine

## 2015-05-25 ENCOUNTER — Ambulatory Visit (INDEPENDENT_AMBULATORY_CARE_PROVIDER_SITE_OTHER): Payer: BC Managed Care – PPO | Admitting: Family Medicine

## 2015-05-25 VITALS — BP 110/80 | HR 80 | Temp 98.9°F | Ht 62.0 in | Wt 212.8 lb

## 2015-05-25 DIAGNOSIS — R350 Frequency of micturition: Secondary | ICD-10-CM | POA: Diagnosis not present

## 2015-05-25 DIAGNOSIS — M545 Low back pain, unspecified: Secondary | ICD-10-CM

## 2015-05-25 LAB — POCT URINALYSIS DIPSTICK
Bilirubin, UA: NEGATIVE
Blood, UA: NEGATIVE
Glucose, UA: NEGATIVE
Ketones, UA: NEGATIVE
Leukocytes, UA: NEGATIVE
Nitrite, UA: NEGATIVE
Protein, UA: NEGATIVE
Spec Grav, UA: 1.01
Urobilinogen, UA: 0.2
pH, UA: 7

## 2015-05-25 NOTE — Progress Notes (Signed)
Pre visit review using our clinic review tool, if applicable. No additional management support is needed unless otherwise documented below in the visit note. 

## 2015-05-26 NOTE — Progress Notes (Signed)
   Subjective:    Patient ID: Selena [name], female    DOB: 11/30/1965, 49 y.o.   MRN: [id number]  HPI Here for low back pain that started yesterday evening. No recent trauma but she did have her yearly GYN 2 days including a Pap smear. noo urinary urgency or burning. No fever. It hurts to bend forward or to twist. Using Advil.    Review of Systems  Constitutional: Negative.   Respiratory: Negative.   Cardiovascular: Negative.   Gastrointestinal: Negative.   Genitourinary: Negative.        Objective:   Physical Exam  Constitutional: She is oriented to person, place, and time. She appears well-developed and well-nourished.  HENT:  Right Ear: External ear normal.  Left Ear: External ear normal.  Nose: Nose normal.  Mouth/Throat: Oropharynx is clear and moist.  Eyes: Conjunctivae are normal.  Neck: No thyromegaly present.  Cardiovascular: Normal rate, regular rhythm, normal heart sounds and intact distal pulses.   Pulmonary/Chest: Effort normal and breath sounds normal.  Abdominal: Soft. Bowel sounds are normal. She exhibits no distension and no mass. There is no tenderness. There is no rebound and no guarding.  Musculoskeletal: She exhibits no edema.  She is tender in the right lower back but ROM is full   Lymphadenopathy:    She has no cervical adenopathy.  Neurological: She is alert and oriented to person, place, and time.          Assessment & Plan:  This is musculoskeletal back pain, use heat. Add Diclofenac and Tizanidine prn.

## 2015-05-27 LAB — CYTOLOGY - PAP

## 2015-06-11 ENCOUNTER — Encounter: Payer: Self-pay | Admitting: Internal Medicine

## 2015-06-13 ENCOUNTER — Other Ambulatory Visit: Payer: Self-pay | Admitting: Internal Medicine

## 2015-06-13 MED ORDER — PAROXETINE HCL ER 25 MG PO TB24: 25.0000 mg | ORAL_TABLET | Freq: Every day | ORAL | Status: DC

## 2015-06-21 ENCOUNTER — Other Ambulatory Visit: Payer: Self-pay | Admitting: Internal Medicine

## 2015-06-27 NOTE — Telephone Encounter (Signed)
Sent unread message to patient.

## 2015-07-15 ENCOUNTER — Ambulatory Visit (INDEPENDENT_AMBULATORY_CARE_PROVIDER_SITE_OTHER): Payer: BC Managed Care – PPO | Admitting: Nurse Practitioner

## 2015-07-15 ENCOUNTER — Telehealth: Payer: Self-pay | Admitting: Internal Medicine

## 2015-07-15 ENCOUNTER — Encounter: Payer: Self-pay | Admitting: Nurse Practitioner

## 2015-07-15 VITALS — BP 110/70 | HR 95 | Temp 97.8°F | Resp 18 | Ht 62.0 in | Wt 219.8 lb

## 2015-07-15 DIAGNOSIS — G4763 Sleep related bruxism: Secondary | ICD-10-CM | POA: Diagnosis not present

## 2015-07-15 DIAGNOSIS — R202 Paresthesia of skin: Secondary | ICD-10-CM | POA: Diagnosis not present

## 2015-07-15 MED ORDER — GABAPENTIN 300 MG PO CAPS: 300.0000 mg | ORAL_CAPSULE | Freq: Every day | ORAL | Status: DC

## 2015-07-15 NOTE — Telephone Encounter (Signed)
FYI

## 2015-07-15 NOTE — Patient Instructions (Signed)
Please try gabapentin with dinner tonight.   Let me know how it is going after 1 week.

## 2015-07-15 NOTE — Progress Notes (Signed)
Patient ID: Selena [name], female    DOB: 11/28/1965  Age: 49 y.o. MRN: [id number]  CC: facial numbness and Tingling   HPI Selena [name] presents for facial and hand numbness (face on the right 2-3 weeks intermittently)  1) Right side of face 2-3 weeks. Lasts a few minutes, burning, stinging. Constant as of yesterday   Tingling in hands with driving yesterday. Only from the fingers up to the tips. No dental issues, denies TMJ but has had problems with this in the past  Patient also reports being lightheaded for a few seconds this morning.   She denies taking anything or trying anything for this. She has been under an immense amount of stress recently and reports that she feels this has something to do with it.  History Hanni has a past medical history of Anemia, iron deficiency; Depression; Hyperlipidemia; Hypertension; Anxiety; Gallstones; Fibromyalgia; Asthma; Eating disorder; and Colon polyps (2014).   She has past surgical history that includes Exploratory laparotomy (2000); Tonsillectomy (1989); Cholecystectomy; and Ganglion cyst excision.   Her family history includes Arthritis in her paternal grandmother; Cancer in her mother and sister; Diabetes in her father; Heart disease in her maternal grandmother and mother; Hyperlipidemia in her mother; Hypertension in her maternal grandmother; Hypothyroidism in her sister; Mental illness in her father. There is no history of Colon cancer or Rectal cancer.She reports that she has never smoked. She has never used smokeless tobacco. She reports that she drinks alcohol. She reports that she does not use illicit drugs.  Outpatient Prescriptions Prior to Visit  Medication Sig Dispense Refill  . cetirizine (ZYRTEC) 10 MG tablet Take 10 mg by mouth daily.      . clonazePAM (KLONOPIN) 1 MG tablet TAKE 1 TABLET BY MOUTH AT BEDTIME AND 1/2 TABLET IN THE MORNING 45 tablet 3  . diphenoxylate-atropine (LOMOTIL) 2.5-0.025 MG per tablet Take 1  tablet by mouth 4 (four) times daily as needed for diarrhea or loose stools. 30 tablet 0  . FLUoxetine (PROZAC) 20 MG tablet TAKE 2 TABLETS BY MOUTH DAILY. 60 tablet 2  . fluticasone (FLONASE) 50 MCG/ACT nasal spray Place 2 sprays into the nose daily. 16 g 6  . levonorgestrel (MIRENA) 20 MCG/24HR IUD 1 Intra Uterine Device (1 each total) by Intrauterine route once. 1 each 0  . omeprazole (PRILOSEC) 20 MG capsule TAKE 1 TABLET (20 MG TOTAL) BY MOUTH DAILY. 30 capsule 11  . rizatriptan (MAXALT) 10 MG tablet TAKE 1 TABLET (10 MG TOTAL) BY MOUTH AS NEEDED FOR MIGRAINE. MAY REPEAT IN 2 HOURS IF NEEDED 10 tablet 5  . tiZANidine (ZANAFLEX) 4 MG tablet TAKE 1/2-1 TABLET BY MOUTH EVERY 8 HOURS AS NEEDED.  4  . diazepam (VALIUM) 10 MG tablet Take 10 mg by mouth every 12 (twelve) hours as needed. for anxiety  1  . ergocalciferol (DRISDOL) 50000 UNITS capsule Take 1 capsule (50,000 Units total) by mouth once a week. 12 capsule 0  . PARoxetine (PAXIL CR) 25 MG 24 hr tablet Take 1 tablet (25 mg total) by mouth daily. 30 tablet 1  . rOPINIRole (REQUIP) 0.25 MG tablet TAKE 1 TABLET BY MOUTH AT BEDTIME. INCREASE EACH WEEK BY 1 TABLET IF NEEDED 90 tablet 0   No facility-administered medications prior to visit.   ROS Review of Systems  Constitutional: Negative for fever, chills, diaphoresis and fatigue.  HENT: Negative for dental problem.   Respiratory: Negative for chest tightness, shortness of breath and wheezing.   Cardiovascular: Negative for chest pain,  palpitations and leg swelling.  Gastrointestinal: Negative for nausea, vomiting and diarrhea.  Skin: Negative for rash.  Neurological: Positive for light-headedness and numbness. Negative for dizziness, tremors, seizures, syncope, facial asymmetry, speech difficulty, weakness and headaches.  Psychiatric/Behavioral: Positive for sleep disturbance. Negative for suicidal ideas. The patient is nervous/anxious.     Objective:  BP 110/70 mmHg  Pulse 95   Temp(Src) 97.8 F (36.6 C) (Oral)  Resp 18  Ht 5\' 2"  (1.575 m)  Wt 219 lb 12 oz (99.678 kg)  BMI 40.18 kg/m2  SpO2 97%  Physical Exam  Constitutional: She is oriented to person, place, and time. She appears well-developed and well-nourished. No distress.  HENT:  Head: Normocephalic and atraumatic.  Right Ear: External ear normal.  Left Ear: External ear normal.  Cardiovascular: Normal rate, regular rhythm and normal heart sounds.  Exam reveals no gallop and no friction rub.   No murmur heard. Pulmonary/Chest: Effort normal and breath sounds normal. No respiratory distress. She has no wheezes. She has no rales. She exhibits no tenderness.  Neurological: She is alert and oriented to person, place, and time. No cranial nerve deficit. She exhibits normal muscle tone. Coordination normal.  Negative chvostek, patient is able to feel light touch and pain sensations. Cranial nerves are intact 2 through 12 Romberg negative Heel/toe/sequential walking intact   Skin: Skin is warm and dry. No rash noted. She is not diaphoretic.  Psychiatric: She has a normal mood and affect. Her behavior is normal. Judgment and thought content normal.   Assessment & Plan:   Aoi was seen today for facial numbness and tingling.  Diagnoses and all orders for this visit:  Bruxism, sleep-related  Paresthesias  Other orders -     gabapentin (NEURONTIN) 300 MG capsule; Take 1 capsule (300 mg total) by mouth at bedtime.  I have discontinued Ms. Sollars's ergocalciferol, rOPINIRole, diazepam, and PARoxetine. I am also having her start on gabapentin. Additionally, I am having her maintain her cetirizine, fluticasone, levonorgestrel, rizatriptan, omeprazole, diphenoxylate-atropine, clonazePAM, tiZANidine, and FLUoxetine.  Meds ordered this encounter  Medications  . gabapentin (NEURONTIN) 300 MG capsule    Sig: Take 1 capsule (300 mg total) by mouth at bedtime.    Dispense:  30 capsule    Refill:  1     Order Specific Question:  Supervising Provider    Answer:  [name] [[id number]]     Follow-up: Return if symptoms worsen or fail to improve.

## 2015-07-15 NOTE — Progress Notes (Signed)
Pre-visit discussion using our clinic review tool. No additional management support is needed unless otherwise documented below in the visit note.  

## 2015-07-15 NOTE — Telephone Encounter (Signed)
[facility]  Patient Name: Selena [name]  DOB: 02-03-1966    Initial Comment caller states she has had numbness and tingling on the right side of her face for the last few weeks- this am notices both hands are tingling   Nurse Assessment  Nurse: Wynetta Emery, RN, Baker Janus Date/Time Eilene Ghazi Time): 07/15/2015 9:02:48 AM  Confirm and document reason for call. If symptomatic, describe symptoms. ---[name] has tingling and numbness in face on right side of face onset two weeks ago and getting worse also notes tingling in fingers and noted when driving yesterday and today in both hands  Has the patient traveled out of the country within the last 30 days? ---No  Does the patient have any new or worsening symptoms? ---Yes  Will a triage be completed? ---Yes  Related visit to physician within the last 2 weeks? ---No  Does the PT have any chronic conditions? (i.e. diabetes, asthma, etc.) ---No  Did the patient indicate they were pregnant? ---No     Guidelines    Guideline Title Affirmed Question Affirmed Notes  Neurologic Deficit [1] Numbness (i.e., loss of sensation) of the face, arm / hand, or leg / foot on one side of the body AND [2] gradual onset (e.g., days to weeks) AND [3] present now numbness to right side of face happened in afternoon and now occurring during the day all day   Final Disposition User   See Physician within 4 Hours (or PCP triage) Wynetta Emery, RN, Baker Janus    Comments  10-10=2016 430pm [name] right side of face tingling/numbness getting worse (scheduled as acute but put 30 minutes in the time allotment frame)   Referrals  REFERRED TO PCP OFFICE   Disagree/Comply: Leta Baptist

## 2015-07-17 DIAGNOSIS — R202 Paresthesia of skin: Secondary | ICD-10-CM | POA: Insufficient documentation

## 2015-07-17 NOTE — Assessment & Plan Note (Signed)
Could be worsening of bruxism. Normal open and close jaw

## 2015-07-17 NOTE — Assessment & Plan Note (Addendum)
Paresthesias of unknown origin. Possible trigeminal neuralgia on the right induced by stress. Tingling in the fingertips could also be stress related. Patient had a normal B12 recently. We'll start gabapentin 300 mg at nighttime. Asked her to let me know how this is going by my chart one week after starting this. Signs and symptoms of stroke were reviewed with patient verbalized understanding that she would seek emergency care if any of these manifest.  Lab Results  Component Value Date   VITAMINB12 344 04/26/2015

## 2015-07-24 NOTE — Telephone Encounter (Signed)
Pt was seen on 10.10.16 by [name]

## 2015-08-05 ENCOUNTER — Ambulatory Visit (INDEPENDENT_AMBULATORY_CARE_PROVIDER_SITE_OTHER): Payer: BC Managed Care – PPO | Admitting: Internal Medicine

## 2015-08-05 VITALS — BP 132/84 | HR 80 | Temp 98.4°F | Wt 218.2 lb

## 2015-08-05 DIAGNOSIS — M542 Cervicalgia: Secondary | ICD-10-CM

## 2015-08-05 DIAGNOSIS — F411 Generalized anxiety disorder: Secondary | ICD-10-CM

## 2015-08-05 NOTE — Progress Notes (Signed)
Subjective:  Patient ID: Selena [name], female    DOB: Jul 30, 1966  Age: 49 y.o. MRN: [id number]  CC: The primary encounter diagnosis was Generalized anxiety disorder. A diagnosis of Cervicalgia was also pertinent to this visit.  HPI [name] presents forfollow up on multiple issues  Facial numbness. .   Was prescribed nuerontin by [name] on Oct 10 for progressive facial numbness x 3 weeks involving right side of face.   Medication has improved the numbness.  Was taking it  at dinner,   but  For the last several days has been reacting to stressful situations with more anger and  mood lability .  2 episodes of almost impulsive behavior   Increased anxiety.  Major stressors:  Her divorces pending, despite dating husband again this summer.  2) Having to refinance her home and borrow money from mother.  She is overhwhelmed by financial stressors for the first time.  Her son from prior marriage, JD has been diagnosed with hypertension , already has obesity .  Weight gain:  "I cant't stop eating." She eats until she falls asleep.  Feels a loss of control over everything. .  Was in a program for binge eating years ago , did it for years,  But stated that it didn't work. History of bulemia prior to the program.  Recently noted that a trial of phentermine helped.   Work is fine.   Did not tolerate change to paxil from prozac, due to tingling of face,    Went back to prozac,  Tingling improved but recurred. Saw [name] and went back on neurontin   Taking 1 mg clonazepam at night to help her sleep , along with tizanadine   Using topomax in a stable dose for migraine prevention  Also had ear pierced In Spring (called a "Daith" piercing ) for migraine prevention and has had far fewer headaches ,  No use of maxalt in months.   History of rheumatology evaluation with steroid injections  By [name] for neck pain.   Outpatient Prescriptions Prior to Visit  Medication Sig Dispense Refill  .  cetirizine (ZYRTEC) 10 MG tablet Take 10 mg by mouth daily.      . clonazePAM (KLONOPIN) 1 MG tablet TAKE 1 TABLET BY MOUTH AT BEDTIME AND 1/2 TABLET IN THE MORNING 45 tablet 3  . diphenoxylate-atropine (LOMOTIL) 2.5-0.025 MG per tablet Take 1 tablet by mouth 4 (four) times daily as needed for diarrhea or loose stools. 30 tablet 0  . FLUoxetine (PROZAC) 20 MG tablet TAKE 2 TABLETS BY MOUTH DAILY. 60 tablet 2  . fluticasone (FLONASE) 50 MCG/ACT nasal spray Place 2 sprays into the nose daily. 16 g 6  . gabapentin (NEURONTIN) 300 MG capsule Take 1 capsule (300 mg total) by mouth at bedtime. (Patient not taking: Reported on 08/05/2015) 30 capsule 1  . levonorgestrel (MIRENA) 20 MCG/24HR IUD 1 Intra Uterine Device (1 each total) by Intrauterine route once. 1 each 0  . omeprazole (PRILOSEC) 20 MG capsule TAKE 1 TABLET (20 MG TOTAL) BY MOUTH DAILY. 30 capsule 11  . rizatriptan (MAXALT) 10 MG tablet TAKE 1 TABLET (10 MG TOTAL) BY MOUTH AS NEEDED FOR MIGRAINE. MAY REPEAT IN 2 HOURS IF NEEDED 10 tablet 5  . tiZANidine (ZANAFLEX) 4 MG tablet TAKE 1/2-1 TABLET BY MOUTH EVERY 8 HOURS AS NEEDED.  4   No facility-administered medications prior to visit.    Review of Systems;  Patient denies headache, fevers, malaise, unintentional weight loss, skin rash,  eye pain, sinus congestion and sinus pain, sore throat, dysphagia,  hemoptysis , cough, dyspnea, wheezing, chest pain, palpitations, orthopnea, edema, abdominal pain, nausea, melena, diarrhea, constipation, flank pain, dysuria, hematuria, urinary  Frequency, nocturia, numbness, tingling, seizures,  Focal weakness, Loss of consciousness,  Tremor, insomnia, depression, anxiety, and suicidal ideation.      Objective:  BP 132/84 mmHg  Pulse 80  Temp(Src) 98.4 F (36.9 C) (Oral)  Wt 218 lb 3.2 oz (98.975 kg)  SpO2 98%  BP Readings from Last 3 Encounters:  08/05/15 132/84  07/15/15 110/70  05/25/15 110/80    Wt Readings from Last 3 Encounters:    08/05/15 218 lb 3.2 oz (98.975 kg)  07/15/15 219 lb 12 oz (99.678 kg)  05/25/15 212 lb 12 oz (96.503 kg)    General appearance: alert, cooperative and appears stated age Ears: normal TM's and external ear canals both ears Throat: lips, mucosa, and tongue normal; teeth and gums normal Neck: no adenopathy, no carotid bruit, supple, symmetrical, trachea midline and thyroid not enlarged, symmetric, no tenderness/mass/nodules Back: symmetric, no curvature. ROM normal. No CVA tenderness. Lungs: clear to auscultation bilaterally Heart: regular rate and rhythm, S1, S2 normal, no murmur, click, rub or gallop Abdomen: soft, non-tender; bowel sounds normal; no masses,  no organomegaly Pulses: 2+ and symmetric Skin: Skin color, texture, turgor normal. No rashes or lesions Lymph nodes: Cervical, supraclavicular, and axillary nodes normal.  Lab Results  Component Value Date   HGBA1C 5.8 04/16/2014   HGBA1C 5.7 07/24/2013   HGBA1C 5.5 01/30/2009    Lab Results  Component Value Date   CREATININE 0.78 04/26/2015   CREATININE 0.9 04/16/2014   CREATININE 1.0 07/24/2013    Lab Results  Component Value Date   WBC 7.4 04/26/2015   HGB 14.3 04/26/2015   HCT 42.4 04/26/2015   PLT 306.0 04/26/2015   GLUCOSE 98 04/26/2015   CHOL 229* 04/26/2015   TRIG 212.0* 04/26/2015   HDL 44.70 04/26/2015   LDLDIRECT 158.0 04/26/2015   LDLCALC 154* 04/16/2014   ALT 24 04/26/2015   AST 18 04/26/2015   NA 136 04/26/2015   K 3.9 04/26/2015   CL 106 04/26/2015   CREATININE 0.78 04/26/2015   BUN 11 04/26/2015   CO2 22 04/26/2015   TSH 1.23 04/26/2015   INR 1.0 07/13/2008   HGBA1C 5.8 04/16/2014   MICROALBUR 0.50 06/17/2012    Dg Cervical Spine Complete  07/27/2013  CLINICAL DATA:  History of neck pain and headache. EXAM: CERVICAL SPINE  4+ VIEWS COMPARISON:  None. FINDINGS: There is no evidence of prevertebral soft tissue swelling. On the lateral image there is reversal of normal cervical lordosis  which may be associated with muscle spasm. Intervertebral disc spaces are preserved. No fracture or bony destruction is seen. There is very slight anterior subluxation of the body of C3 on the body of C4 and the body of C4 on the body of C5. No kyphosis is seen. No spondylosis is evident. IMPRESSION: On the lateral image there is reversal of normal cervical lordosis which may be associated with muscle spasm. There is very slight anterior subluxation of the body of C3 on the body of C4 and the body of C4 on the body of C5. No kyphosis is seen. No fracture is evident. Electronically Signed   By: Shanon Brow  Call M.D.   On: 07/27/2013 16:06    Assessment & Plan:   Problem List Items Addressed This Visit    Cervicalgia    Records from  prior rheumatologic evaluation requested,  Resume tizanidine,  Stop neurontin.       Generalized anxiety disorder - Primary    Davised to resume trial of  paxil and tapering off prozac.  Encouraged her to resume regular exercise.        4I am having Ms. Timberman maintain her cetirizine, fluticasone, levonorgestrel, rizatriptan, omeprazole, diphenoxylate-atropine, clonazePAM, tiZANidine, FLUoxetine, and gabapentin.  No orders of the defined types were placed in this encounter.    A total of 25 minutes of face to face time was spent with patient more than half of which was spent in counselling on the above mentioned issues.   There are no discontinued medications.  Follow-up: Return in about 4 weeks (around 09/02/2015).   [name], MD

## 2015-08-05 NOTE — Patient Instructions (Addendum)
Stop the neurontin, resume the tizanidine !  Start Paxil once daily for your anxiety.   Reduce prozac dose to 20 mg after you have taken 3 days of paxil,  Then after 3 days of 20 mg prozac daily ,  Stop the prozac completely  Continue topomax for now for headache prevention  Start a regular exercise program to help relieve your anxiety.  Go for a walk when you feel like eating   Use your heating pad on your neck to relieve the muscle spasm and lump   Return in one month

## 2015-08-06 ENCOUNTER — Encounter: Payer: Self-pay | Admitting: Internal Medicine

## 2015-08-06 NOTE — Assessment & Plan Note (Signed)
Records from prior rheumatologic evaluation requested,  Resume tizanidine,  Stop neurontin.

## 2015-08-06 NOTE — Assessment & Plan Note (Addendum)
Davised to resume trial of  paxil and tapering off prozac.  Encouraged her to resume regular exercise.

## 2015-08-07 ENCOUNTER — Encounter: Payer: Self-pay | Admitting: Internal Medicine

## 2015-08-10 ENCOUNTER — Other Ambulatory Visit: Payer: Self-pay | Admitting: Internal Medicine

## 2015-08-10 DIAGNOSIS — M4712 Other spondylosis with myelopathy, cervical region: Secondary | ICD-10-CM

## 2015-08-13 ENCOUNTER — Encounter: Payer: Self-pay | Admitting: Internal Medicine

## 2015-08-23 ENCOUNTER — Ambulatory Visit
Admission: RE | Admit: 2015-08-23 | Discharge: 2015-08-23 | Disposition: A | Discharge status: Home / Self Care | Payer: BC Managed Care – PPO | Source: Ambulatory Visit | Attending: Internal Medicine | Admitting: Internal Medicine

## 2015-08-23 DIAGNOSIS — M1288 Other specific arthropathies, not elsewhere classified, other specified site: Secondary | ICD-10-CM | POA: Diagnosis not present

## 2015-08-23 DIAGNOSIS — M542 Cervicalgia: Secondary | ICD-10-CM | POA: Insufficient documentation

## 2015-08-23 DIAGNOSIS — M4802 Spinal stenosis, cervical region: Secondary | ICD-10-CM | POA: Diagnosis not present

## 2015-08-23 DIAGNOSIS — M4712 Other spondylosis with myelopathy, cervical region: Secondary | ICD-10-CM

## 2015-08-26 ENCOUNTER — Encounter: Payer: Self-pay | Admitting: Internal Medicine

## 2015-08-26 ENCOUNTER — Telehealth: Payer: Self-pay | Admitting: Internal Medicine

## 2015-08-26 ENCOUNTER — Other Ambulatory Visit: Payer: Self-pay | Admitting: Internal Medicine

## 2015-08-26 DIAGNOSIS — M4712 Other spondylosis with myelopathy, cervical region: Secondary | ICD-10-CM

## 2015-08-26 NOTE — Telephone Encounter (Signed)
Would like the referral, [place] is fine.

## 2015-08-26 NOTE — Telephone Encounter (Signed)
She has moderate changes to multiple levels,  Worse on the right,   at the levels that correlate with her right sided facial numbness and pain.  Referral to neurosurgery advised, i will initiate the referral to [facility] specialists if she agrees

## 2015-08-26 NOTE — Telephone Encounter (Signed)
Pt called about fallen today and feel dizzy. Pt wants to know what her lab results are? Pt was having numbness and tingling of the face, that's what they was checking for. Thank You!

## 2015-08-26 NOTE — Telephone Encounter (Signed)
Spoke with the patient for clarity.  Patient was looking for MRI results.  I see it is in the chart, please review and I will return a call to her.  Also wanted to let you know she fell this am.  States she believes it is just her Vertigo, but has almost fallen twice in the last week, but actually fell to the ground this morning.  Felt like a fish flopping down.  Only scraped her knees, no other injuries.  She has felt different for the past 5 or 6 days.  Her frontal face numbness has been for the past 5 weeks, only on the right side until it gets worse then will be on the left forehead region also.    Please advise.

## 2015-08-30 ENCOUNTER — Ambulatory Visit (INDEPENDENT_AMBULATORY_CARE_PROVIDER_SITE_OTHER): Payer: BC Managed Care – PPO | Admitting: Family

## 2015-08-30 ENCOUNTER — Encounter: Payer: Self-pay | Admitting: Family

## 2015-08-30 VITALS — BP 110/82 | HR 86 | Temp 98.5°F | Resp 18 | Wt 218.0 lb

## 2015-08-30 DIAGNOSIS — S8002XA Contusion of left knee, initial encounter: Secondary | ICD-10-CM | POA: Diagnosis not present

## 2015-08-30 DIAGNOSIS — S8000XA Contusion of unspecified knee, initial encounter: Secondary | ICD-10-CM | POA: Insufficient documentation

## 2015-08-30 MED ORDER — DICLOFENAC SODIUM 75 MG PO TBEC: 75.0000 mg | DELAYED_RELEASE_TABLET | Freq: Two times a day (BID) | ORAL | Status: DC

## 2015-08-30 NOTE — Patient Instructions (Addendum)
Thank you for choosing [facility].  Summary/Instructions:  Please use heat 2-3 x per time and keep your leg elevated.  Continue with motion of your knee. Follow up if symptoms worsen or do not improve.  If your symptoms worsen or fail to improve, please contact our office for further instruction, or in case of emergency go directly to the emergency room at the closest medical facility.    Contusion A contusion is a deep bruise. Contusions are the result of a blunt injury to tissues and muscle fibers under the skin. The injury causes bleeding under the skin. The skin overlying the contusion may turn blue, purple, or yellow. Minor injuries will give you a painless contusion, but more severe contusions may stay painful and swollen for a few weeks.  CAUSES  This condition is usually caused by a blow, trauma, or direct force to an area of the body. SYMPTOMS  Symptoms of this condition include:  Swelling of the injured area.  Pain and tenderness in the injured area.  Discoloration. The area may have redness and then turn blue, purple, or yellow. DIAGNOSIS  This condition is diagnosed based on a physical exam and medical history. An X-ray, CT scan, or MRI may be needed to determine if there are any associated injuries, such as broken bones (fractures). TREATMENT  Specific treatment for this condition depends on what area of the body was injured. In general, the best treatment for a contusion is resting, icing, applying pressure to (compression), and elevating the injured area. This is often called the RICE strategy. Over-the-counter anti-inflammatory medicines may also be recommended for pain control.  HOME CARE INSTRUCTIONS   Rest the injured area.  If directed, apply ice to the injured area:  Put ice in a plastic bag.  Place a towel between your skin and the bag.  Leave the ice on for 20 minutes, 2-3 times per day.  If directed, apply light compression to the injured area  using an elastic bandage. Make sure the bandage is not wrapped too tightly. Remove and reapply the bandage as directed by your health care provider.  If possible, raise (elevate) the injured area above the level of your heart while you are sitting or lying down.  Take over-the-counter and prescription medicines only as told by your health care provider. SEEK MEDICAL CARE IF:  Your symptoms do not improve after several days of treatment.  Your symptoms get worse.  You have difficulty moving the injured area. SEEK IMMEDIATE MEDICAL CARE IF:   You have severe pain.  You have numbness in a hand or foot.  Your hand or foot turns pale or cold.   This information is not intended to replace advice given to you by your health care provider. Make sure you discuss any questions you have with your health care provider.   Document Released: 07/01/2005 Document Revised: 06/12/2015 Document Reviewed: 02/06/2015 Elsevier Interactive Patient Education [employer].

## 2015-08-30 NOTE — Assessment & Plan Note (Signed)
Symptoms and exam consistent with knee contusion. Treat conservatively with heat and range of motion exercises. Over-the-counter medications as needed for symptom relief. Refill diclofenac per patient request. Follow-up if symptoms worsen or fail to improve.

## 2015-08-30 NOTE — Progress Notes (Signed)
Pre visit review using our clinic review tool, if applicable. No additional management support is needed unless otherwise documented below in the visit note. 

## 2015-08-30 NOTE — Progress Notes (Signed)
Subjective:    Patient ID: Selena [name], female    DOB: 10-26-1965, 49 y.o.   MRN: [id number]  Chief Complaint  Patient presents with  . 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 burising started this morning. Having issues with Dizziness, seeing neurosurgery on Monday    HPI:  Selena [name] is a 49 y.o. female who  has a past medical history of Anemia, iron deficiency; Depression; Hyperlipidemia; Hypertension; Anxiety; Gallstones; Fibromyalgia; Asthma; Eating disorder; and Colon polyps (2014). and presents today for an acute office visit.   This is a new problem. Recently experienced a fall on 11/21 eating a tile floor and landing on her bilateral knees. Modifying factors include antibiotic ointments on her abrasions. Continues to experience some discomfort with motion and a small discoloration on the left knee that started this morning. . Does have previous issues with dizziness and was scheduled seek neurosurgery on Monday.   Allergies  Allergen Reactions  . Sulfonamide Derivatives     REACTION: Itchy    Current Outpatient Prescriptions on File Prior to Visit  Medication Sig Dispense Refill  . cetirizine (ZYRTEC) 10 MG tablet Take 10 mg by mouth daily.      . clonazePAM (KLONOPIN) 1 MG tablet TAKE 1 TABLET BY MOUTH AT BEDTIME AND 1/2 TABLET IN THE MORNING 45 tablet 3  . diphenoxylate-atropine (LOMOTIL) 2.5-0.025 MG per tablet Take 1 tablet by mouth 4 (four) times daily as needed for diarrhea or loose stools. 30 tablet 0  . fluticasone (FLONASE) 50 MCG/ACT nasal spray Place 2 sprays into the nose daily. 16 g 6  . levonorgestrel (MIRENA) 20 MCG/24HR IUD 1 Intra Uterine Device (1 each total) by Intrauterine route once. 1 each 0  . omeprazole (PRILOSEC) 20 MG capsule TAKE 1 TABLET (20 MG TOTAL) BY MOUTH DAILY. 30 capsule 11  . rizatriptan (MAXALT) 10 MG tablet TAKE 1 TABLET (10 MG TOTAL) BY MOUTH AS NEEDED FOR MIGRAINE. MAY REPEAT IN 2 HOURS IF NEEDED 10 tablet 5  .  tiZANidine (ZANAFLEX) 4 MG tablet TAKE 1/2-1 TABLET BY MOUTH EVERY 8 HOURS AS NEEDED.  4  . [DISCONTINUED] omeprazole (PRILOSEC OTC) 20 MG tablet Take 1 tablet (20 mg total) by mouth daily. 30 tablet 11   No current facility-administered medications on file prior to visit.    Review of Systems  Constitutional: Negative for fever and chills.  Musculoskeletal:       Positive for left knee pain.  Skin: Positive for wound.      Objective:    BP 110/82 mmHg  Pulse 86  Temp(Src) 98.5 F (36.9 C) (Oral)  Resp 18  Wt 218 lb (98.884 kg)  SpO2 99% Nursing note and vital signs reviewed.  Physical Exam  Constitutional: She is oriented to person, place, and time. She appears well-developed and well-nourished. No distress.  Cardiovascular: Normal rate, regular rhythm, normal heart sounds and intact distal pulses.   Pulmonary/Chest: Effort normal and breath sounds normal.  Musculoskeletal:  Left knee - small abrasion with good healing. Tenderness of anterior knee inferior to patella and lateral collateral ligament. Mild discoloration noted going down distal one third of tibia light yellowish color. Knee range of motion is normal. Strength is normal. Distal pulses and sensation are intact and appropriate. Meniscal ligamentous testing is negative.  Neurological: She is alert and oriented to person, place, and time.  Skin: Skin is warm and dry.  Psychiatric: She has a normal mood and affect. Her behavior is  normal. Judgment and thought content normal.       Assessment & Plan:   Problem List Items Addressed This Visit      Other   Knee contusion - Primary    Symptoms and exam consistent with knee contusion. Treat conservatively with heat and range of motion exercises. Over-the-counter medications as needed for symptom relief. Refill diclofenac per patient request. Follow-up if symptoms worsen or fail to improve.      Relevant Medications   diclofenac (VOLTAREN) 75 MG EC tablet

## 2015-09-03 ENCOUNTER — Encounter: Payer: Self-pay | Admitting: Internal Medicine

## 2015-09-03 ENCOUNTER — Telehealth: Payer: Self-pay | Admitting: Internal Medicine

## 2015-09-05 ENCOUNTER — Ambulatory Visit (INDEPENDENT_AMBULATORY_CARE_PROVIDER_SITE_OTHER): Payer: BC Managed Care – PPO | Admitting: Neurology

## 2015-09-05 ENCOUNTER — Encounter: Payer: Self-pay | Admitting: Neurology

## 2015-09-05 VITALS — BP 136/90 | HR 64 | Resp 16 | Ht 62.0 in | Wt 222.2 lb

## 2015-09-05 DIAGNOSIS — F411 Generalized anxiety disorder: Secondary | ICD-10-CM

## 2015-09-05 DIAGNOSIS — O926 Galactorrhea: Secondary | ICD-10-CM

## 2015-09-05 DIAGNOSIS — N643 Galactorrhea not associated with childbirth: Secondary | ICD-10-CM | POA: Insufficient documentation

## 2015-09-05 DIAGNOSIS — R270 Ataxia, unspecified: Secondary | ICD-10-CM | POA: Diagnosis not present

## 2015-09-05 DIAGNOSIS — M542 Cervicalgia: Secondary | ICD-10-CM

## 2015-09-05 DIAGNOSIS — G2581 Restless legs syndrome: Secondary | ICD-10-CM

## 2015-09-05 DIAGNOSIS — R200 Anesthesia of skin: Secondary | ICD-10-CM | POA: Insufficient documentation

## 2015-09-05 DIAGNOSIS — G43009 Migraine without aura, not intractable, without status migrainosus: Secondary | ICD-10-CM

## 2015-09-05 DIAGNOSIS — R208 Other disturbances of skin sensation: Secondary | ICD-10-CM

## 2015-09-05 NOTE — Progress Notes (Signed)
[facility]  PATIENT: Selena [name] DOB: [birth year]  REFERRING DOCTOR OR PCP:  Leeroy Cha M.D   [phone]).Satira Sark is PCP SOURCE:  Patient, records from Dr. Joya Salm an MRI images on PACS.  _________________________________   HISTORICAL  CHIEF COMPLAINT:  Chief Complaint  Patient presents with  . Facial Numbness    Sts. onset in September of intermittent right sided facial numbness.  Sts. mri c-spine was done, and based on results, she had NS eval (Dr. Joya Salm).  Sts. he told her numbness is not coming from her neck and rec. neuro eval. Sts. on exam he told her she was not walking straight. Pt. is ambulatory with steady gait, no assistance today.  No relief of facial numbness with Gabapentin/fim  . Migraines    Hx. of migraines which she sts. are fairly well controlled with Topamax and Maxalt/fim    HISTORY OF PRESENT ILLNESS:  I had the pleasure of seeing your patient, Selena [name], at [facility] for a neurologic consultation regarding her right facial numbness and gait ataxia.  She is a 49 year old woman who had the onset of right facial numbness in late September. She notes that the onset occurred over 1 day was constant for a couple days. Since that time it has been more intermittent. At times it will involve the maxillary division and at other times the ophthalmic division of the face. Sometimes both will be involved.    She cannot associate facial numbness with any position of her head or with any activity. Nothing makes it better or worse. She was tried on gabapentin  But it was not effective.   On occasion, she has noted some numbness in the right hand. The numbness is unassociated with position of her neck.  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   She notes that she was looking down at the time and felt very unsteady. She has had other episodes where she feels unsteady with her gait when  she is either looking down or looking up. She had an MRI of the cervical spine performed that I reviewed. The images show that she has facet hypertrophy to the right at C3-C4 and some other minimal degenerative changes. However, there does not appear to be any spinal cord pathology. Earlier this week she saw Dr. Joya Salm in neurosurgery.    At that visit, he noted that her gait was off balanced when looking down.    She notes that she has some urinary frequency but this has been a minor issue for more than a year. Shenot had incontinence.  She has not had optic neuritis. She has had some issues with dry eyes and was prescribed eyedrops. At times, her vision is blurry.  She has had more daytime sleepiness and has fallen asleep at times unexpected. She notes that she did have a sleep study earlier this year that did not show any sleep apnea. She does snore at night. She also has restless leg syndrome and periodic limb movements of sleep.  She notes some additional stress as she is going through a separation. She has some anxiety but has not had much issues with depression.  She reports that she has a history of common migraine headaches. She will get pounding pain associated with nausea but no vomiting. She has photophobia. Often if she lays down the headache will improve. Other times she will take a Maxalt. If that is not effective she requires a  shot couple times a year. She is on Topamax with benefit.  She has galactorrhea for a couple years but none in last year.  A pituitary microadenoma was considered based on symptoms and MRI but repeat MRI 2012 showed a normal pituitary.     MRI of the cervical spine 08/23/15.   The images show facet hypertrophy with a joint effusion at C3-C4 and minimal degenerative changes at other levels. There is no significant foraminal narrowing. There is no spinal cord pathology.  MRI of the brain 01/12/11 was normal.  Pituitary appear to have a normal contour and the  infundibulum was midline.    REVIEW OF SYSTEMS: Constitutional: No fevers, chills, sweats, or change in appetite.   She has fatigue and sleepiness Eyes: Notes visual changes.   No double vision, eye pain Ear, nose and throat: No hearing loss, ear pain, nasal congestion, sore throat Cardiovascular: No chest pain, palpitations Respiratory: No shortness of breath at rest or with exertion.   No wheezes GastrointestinaI: No nausea, vomiting, diarrhea, abdominal pain, fecal incontinence Genitourinary: No dysuria, urinary retention or frequency.  No nocturia. Musculoskeletal: No neck pain, back pain Integumentary: No rash, pruritus, skin lesions Neurological: as above Psychiatric: Notes stress with some depression and anxiety Endocrine: No palpitations, diaphoresis, change in appetite, change in weigh or increased thirst Hematologic/Lymphatic: No anemia, purpura, petechiae. Allergic/Immunologic: No itchy/runny eyes, nasal congestion, recent allergic reactions, rashes  ALLERGIES: Allergies  Allergen Reactions  . Sulfonamide Derivatives     REACTION: Itchy    HOME MEDICATIONS:  Current outpatient prescriptions:  .  clonazePAM (KLONOPIN) 1 MG tablet, TAKE 1 TABLET BY MOUTH AT BEDTIME AND 1/2 TABLET IN THE MORNING, Disp: 45 tablet, Rfl: 3 .  diclofenac (VOLTAREN) 75 MG EC tablet, Take 1 tablet (75 mg total) by mouth 2 (two) times daily., Disp: 30 tablet, Rfl: 0 .  levonorgestrel (MIRENA) 20 MCG/24HR IUD, 1 Intra Uterine Device (1 each total) by Intrauterine route once., Disp: 1 each, Rfl: 0 .  omeprazole (PRILOSEC) 20 MG capsule, TAKE 1 TABLET (20 MG TOTAL) BY MOUTH DAILY., Disp: 30 capsule, Rfl: 11 .  rizatriptan (MAXALT) 10 MG tablet, TAKE 1 TABLET (10 MG TOTAL) BY MOUTH AS NEEDED FOR MIGRAINE. MAY REPEAT IN 2 HOURS IF NEEDED, Disp: 10 tablet, Rfl: 5 .  tiZANidine (ZANAFLEX) 4 MG tablet, TAKE 1/2-1 TABLET BY MOUTH EVERY 8 HOURS AS NEEDED., Disp: , Rfl: 4 .  cetirizine (ZYRTEC) 10 MG  tablet, Take 10 mg by mouth daily.  , Disp: , Rfl:  .  diphenoxylate-atropine (LOMOTIL) 2.5-0.025 MG per tablet, Take 1 tablet by mouth 4 (four) times daily as needed for diarrhea or loose stools. (Patient not taking: Reported on 09/05/2015), Disp: 30 tablet, Rfl: 0 .  fluticasone (FLONASE) 50 MCG/ACT nasal spray, Place 2 sprays into the nose daily. (Patient not taking: Reported on 09/05/2015), Disp: 16 g, Rfl: 6 .  PARoxetine (PAXIL-CR) 25 MG 24 hr tablet, , Disp: , Rfl:  .  topiramate (TOPAMAX) 50 MG tablet, , Disp: , Rfl:  .  [DISCONTINUED] omeprazole (PRILOSEC OTC) 20 MG tablet, Take 1 tablet (20 mg total) by mouth daily., Disp: 30 tablet, Rfl: 11  PAST MEDICAL HISTORY: Past Medical History  Diagnosis Date  . Anemia, iron deficiency   . Depression   . Hyperlipidemia   . Hypertension   . Anxiety   . Gallstones   . Fibromyalgia   . Asthma   . Eating disorder     Binge eating  .  Colon polyps 2014    PAST SURGICAL HISTORY: Past Surgical History  Procedure Laterality Date  . 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 Tonsillectomy  1989  . Cholecystectomy    . Ganglion cyst excision      FAMILY HISTORY: Family History  Problem Relation Age of Onset  . Hyperlipidemia Mother   . Heart disease Mother     Atrial fibrilation  . Cancer Mother     ? Melanoma  . Diabetes Father   . Mental illness Father     Bipolar  . Cancer Sister     Clear cell sarcoma in leg  . Hypothyroidism Sister   . Hypertension Maternal Grandmother   . Heart disease Maternal Grandmother     Afib  . Arthritis Paternal Grandmother   . Colon cancer Neg Hx   . Rectal cancer Neg Hx     SOCIAL HISTORY:  Social History   Social History  . Marital Status: Married    Spouse Name: N/A  . Number of Children: 1  . Years of Education: N/A   Occupational History  . Teaches preschool    Social History Main Topics  . Smoking status: Never Smoker   . Smokeless tobacco: Never Used  . Alcohol  Use: Yes     Comment: 4 Times a month  . Drug Use: No  . Sexual Activity: Yes    Birth Control/ Protection: IUD   Other Topics Concern  . Not on file   Social History Narrative   2 Step children; asthma, ADHD     PHYSICAL EXAM  Filed Vitals:   09/05/15 0901  BP: 136/90  Pulse: 64  Resp: 16  Height: 5' 2"  (1.575 m)  Weight: 222 lb 3.2 oz (100.789 kg)    Body mass index is 40.63 kg/(m^2).   General: The patient is well-developed and well-nourished and in no acute distress  Eyes:  Funduscopic exam shows normal optic discs and retinal vessels.  Neck: The neck is supple, no carotid bruits are noted.  The neck is nontender.  Cardiovascular: The heart has a regular rate and rhythm with a normal S1 and S2. There were no murmurs, gallops or rubs.    Skin: Extremities are without significant edema.  Musculoskeletal:  Back is nontender  Neurologic Exam  Mental status: The patient is alert and oriented x 3 at the time of the examination. The patient has apparent normal recent and remote memory, with an apparently normal attention span and concentration ability.   Speech is normal.  Cranial nerves: Extraocular movements are full. Pupils are equal, round, and reactive to light and accomodation.  Visual fields are full.  Facial symmetry is present. There is good facial sensation to vibration bilaterally but she notes decreased sensation in V1, V2, V3 on the right.  Facial strength is normal.  Trapezius and sternocleidomastoid strength is normal. No dysarthria is noted.  The tongue is midline, and the patient has symmetric elevation of the soft palate. Hearing is slightly lower on the right to the tuning fork.      Motor:  Muscle bulk is normal.   Tone is normal. Strength is  5 / 5 in all 4 extremities.   Sensory: Sensory testing is intact to pinprick, soft touch and vibration sensation in all 4 extremities.  Coordination: Cerebellar testing reveals good finger-nose-finger and  heel-to-shin bilaterally.  Gait and station: Station is normal.   Gait is normal looking forward, mildly wide looking down.  Tandem gait is mildly wide . Romberg is borderline positive.   Reflexes: Deep tendon reflexes are symmetric and normal bilaterally.   Plantar responses are flexor.    DIAGNOSTIC DATA (LABS, IMAGING, TESTING) - I reviewed patient records, labs, notes, testing and imaging myself where available.  Lab Results  Component Value Date   WBC 7.4 04/26/2015   HGB 14.3 04/26/2015   HCT 42.4 04/26/2015   MCV 93.4 04/26/2015   PLT 306.0 04/26/2015      Component Value Date/Time   NA 136 04/26/2015 0818   K 3.9 04/26/2015 0818   CL 106 04/26/2015 0818   CO2 22 04/26/2015 0818   GLUCOSE 98 04/26/2015 0818   BUN 11 04/26/2015 0818   CREATININE 0.78 04/26/2015 0818   CREATININE 0.96 06/17/2012 1638   CALCIUM 9.0 04/26/2015 0818   PROT 6.3 04/26/2015 0818   ALBUMIN 4.1 04/26/2015 0818   AST 18 04/26/2015 0818   ALT 24 04/26/2015 0818   ALKPHOS 45 04/26/2015 0818   BILITOT 0.5 04/26/2015 0818   GFRNONAA 82.41 04/18/2010 0902   GFRAA 79 10/23/2008 0957   Lab Results  Component Value Date   CHOL 229* 04/26/2015   HDL 44.70 04/26/2015   LDLCALC 154* 04/16/2014   LDLDIRECT 158.0 04/26/2015   TRIG 212.0* 04/26/2015   CHOLHDL 5 04/26/2015   Lab Results  Component Value Date   HGBA1C 5.8 04/16/2014   Lab Results  Component Value Date   VITAMINB12 344 04/26/2015   Lab Results  Component Value Date   TSH 1.23 04/26/2015       ASSESSMENT AND PLAN  Right facial numbness - Plan: ANA w/Reflex, Sedimentation rate, MR Brain W Wo Contrast  Migraine without aura and without status migrainosus, not intractable  Ataxia - Plan: ANA w/Reflex, Sedimentation rate, MR Brain W Wo Contrast  Cervicalgia - Plan: ANA w/Reflex, Sedimentation rate  Restless legs syndrome  Generalized anxiety disorder  Galactorrhea - Plan: Prolactin   In summary, Selena [name]  is a 49 year old woman with right facial numbness for the past 2 months who also has mild gait ataxia her MRI of the cervical spine shows a normal spinal cord.  We ned to be  to be concerned about a posterior fossa process such as multiple sclerosis.   I am less concerned about stroke or tumor given her symptoms and time course.     I also discussed with her that often, with sensory symptoms, an evaluation does not lead to a specific diagnosis.     We will check an MRI of the brain with and without contrast and I will also check for prolactin as there was a question of a pituitary microadenoma in the past with her history of galactorrhea. We will also check ESR and ANA for the possibility of Sjogren's.   Migraines are doing okay on her current regimen and she will continue the topiramate and take a Maxalt prn.    She will return to see me in 2 months or sooner based on the results of the MRI or she has new or worsening symptoms.  Thank you for asking me to see Mrs. Borello for a neurologic  sulcation. Please let me know if I can be of further assistance with her or other patients in the future.,   Richard A. Felecia Shelling, MD, PhD 05/11/5783, 6:96 AM Certified in Neurology, Clinical Neurophysiology, Sleep Medicine, Pain Medicine and Neuroimaging  [facility] [address], [place], [place] [address] [phone]

## 2015-09-06 LAB — SEDIMENTATION RATE: Sed Rate: 4 mm/hr (ref 0–32)

## 2015-09-06 LAB — PROLACTIN: Prolactin: 7.5 ng/mL (ref 4.8–23.3)

## 2015-09-06 LAB — ANA W/REFLEX: Anti Nuclear Antibody(ANA): NEGATIVE

## 2015-09-09 ENCOUNTER — Telehealth: Payer: Self-pay | Admitting: *Deleted

## 2015-09-09 ENCOUNTER — Other Ambulatory Visit: Payer: Self-pay | Admitting: Internal Medicine

## 2015-09-09 NOTE — Telephone Encounter (Signed)
Please advise refill? 

## 2015-09-09 NOTE — Telephone Encounter (Signed)
-----   Message from Britt Bottom, MD sent at 09/08/2015  6:26 PM EST ----- Pleas eel there know the labwork was normal

## 2015-09-10 ENCOUNTER — Telehealth: Payer: Self-pay | Admitting: *Deleted

## 2015-09-10 ENCOUNTER — Ambulatory Visit (INDEPENDENT_AMBULATORY_CARE_PROVIDER_SITE_OTHER): Payer: Self-pay

## 2015-09-10 DIAGNOSIS — R208 Other disturbances of skin sensation: Secondary | ICD-10-CM | POA: Diagnosis not present

## 2015-09-10 DIAGNOSIS — R200 Anesthesia of skin: Secondary | ICD-10-CM

## 2015-09-10 DIAGNOSIS — R270 Ataxia, unspecified: Secondary | ICD-10-CM

## 2015-09-10 DIAGNOSIS — Z0289 Encounter for other administrative examinations: Secondary | ICD-10-CM

## 2015-09-10 NOTE — Telephone Encounter (Signed)
Pt called back. She was told her labs were normal. She is also wondering if she could get a call back about her MRI results. May call [phone]

## 2015-09-10 NOTE — Telephone Encounter (Signed)
I have spoken with Selena [name] and per RAS, advised that labs are normal, will call with mri results once RAS has read it.  She verbalized understanding of same/fim

## 2015-09-10 NOTE — Telephone Encounter (Signed)
-----   Message from Britt Bottom, MD sent at 09/08/2015  6:26 PM EST ----- Pleas eel there know the labwork was normal

## 2015-09-10 NOTE — Telephone Encounter (Signed)
I have spoken with Selena [name] this afternoon and per RAS, advised that labs are normal; I will call with mri results once RAS has read it.  She verbalized understanding of same/fim

## 2015-09-11 ENCOUNTER — Telehealth: Payer: Self-pay | Admitting: Neurology

## 2015-09-11 NOTE — Telephone Encounter (Signed)
Called pt back. Results not ready yet. Advised we will call once they are ready. She verbalized understanding.

## 2015-09-11 NOTE — Telephone Encounter (Signed)
Ok to refill,  printed rx  

## 2015-09-11 NOTE — Telephone Encounter (Signed)
Pt called requesting MRI results.  

## 2015-09-12 NOTE — Telephone Encounter (Signed)
LMTC./fim 

## 2015-09-12 NOTE — Telephone Encounter (Signed)
Pt called inquiring if results are ready. She is anxious

## 2015-09-13 ENCOUNTER — Telehealth: Payer: Self-pay | Admitting: *Deleted

## 2015-09-13 NOTE — Telephone Encounter (Signed)
I spoke with Selena [name] this morning and per RAS, advised that mri brain was normal.  She verbalized understanding of same/fim

## 2015-09-13 NOTE — Telephone Encounter (Signed)
I have spoken with Selena [name] this morning and per RAS, advised that MRI brain and labs were all normal.  If her sx. resolve, she can cancel  her f/u appt, but if they continue and she would like to see him again, she may keep f/u appt.  She verbalized understanding of same/fim

## 2015-09-13 NOTE — Telephone Encounter (Signed)
Patient returned call, please call [phone].

## 2015-09-13 NOTE — Telephone Encounter (Signed)
-----   Message from Britt Bottom, MD sent at 09/13/2015 12:15 PM EST ----- MRI of the brain is normal.

## 2015-09-19 ENCOUNTER — Encounter: Payer: Self-pay | Admitting: Internal Medicine

## 2015-09-20 ENCOUNTER — Other Ambulatory Visit: Payer: Self-pay | Admitting: Internal Medicine

## 2015-09-20 ENCOUNTER — Telehealth: Payer: Self-pay | Admitting: Family

## 2015-09-20 DIAGNOSIS — F341 Dysthymic disorder: Secondary | ICD-10-CM

## 2015-09-20 DIAGNOSIS — J0120 Acute ethmoidal sinusitis, unspecified: Secondary | ICD-10-CM

## 2015-09-20 MED ORDER — AMOXICILLIN-POT CLAVULANATE 875-125 MG PO TABS: 1.0000 | ORAL_TABLET | Freq: Two times a day (BID) | ORAL | Status: AC

## 2015-09-20 NOTE — Progress Notes (Signed)
We are sorry that you are not feeling well.  Here is how we plan to help!  Based on what you have shared with me it looks like you have sinusitis.  Sinusitis is inflammation and infection in the sinus cavities of the head.  Based on your presentation I believe you most likely have Acute Bacterial Sinusitis.  This is an infection caused by bacteria and is treated with antibiotics. I have prescribed Augmentin, an antibiotic in the penicillin family, one tablet twice daily with food, for 7 days. You may use an oral decongestant such as Mucinex D or if you have glaucoma or high blood pressure use plain Mucinex. Saline nasal spray help and can safely be used as often as needed for congestion.  If you develop worsening sinus pain, fever or notice severe headache and vision changes, or if symptoms are not better after completion of antibiotic, please schedule an appointment with a health care provider.    Sinus infections are not as easily transmitted as other respiratory infection, however we still recommend that you avoid close contact with loved ones, especially the very young and elderly.  Remember to wash your hands thoroughly throughout the day as this is the number one way to prevent the spread of infection!  Home Care:  Only take medications as instructed by your medical team.  Complete the entire course of an antibiotic.  Do not take these medications with alcohol.  A steam or ultrasonic humidifier can help congestion.  You can place a towel over your head and breathe in the steam from hot water coming from a faucet.  Avoid close contacts especially the very young and the elderly.  Cover your mouth when you cough or sneeze.  Always remember to wash your hands.  Get Help Right Away If:  You develop worsening fever or sinus pain.  You develop a severe head ache or visual changes.  Your symptoms persist after you have completed your treatment plan.  Make sure you  Understand these  instructions.  Will watch your condition.  Will get help right away if you are not doing well or get worse.  Your e-visit answers were reviewed by a board certified advanced clinical practitioner to complete your personal care plan.  Depending on the condition, your plan could have included both over the counter or prescription medications.  If there is a problem please reply  once you have received a response from your provider.  Your safety is important to [place].  If you have drug allergies check your prescription carefully.    You can use MyChart to ask questions about today's visit, request a non-urgent call back, or ask for a work or school excuse for 24 hours related to this e-Visit. If it has been greater than 24 hours you will need to follow up with your provider, or enter a new e-Visit to address those concerns.  You will get an e-mail in the next two days asking about your experience.  I hope that your e-visit has been valuable and will speed your recovery. Thank you for using e-visits.

## 2015-09-26 ENCOUNTER — Ambulatory Visit: Payer: BC Managed Care – PPO

## 2015-10-04 ENCOUNTER — Encounter: Payer: Self-pay | Admitting: Internal Medicine

## 2015-10-04 ENCOUNTER — Other Ambulatory Visit: Payer: Self-pay | Admitting: Internal Medicine

## 2015-10-04 ENCOUNTER — Telehealth: Payer: Self-pay

## 2015-10-04 MED ORDER — PHENTERMINE HCL 37.5 MG PO TABS: ORAL_TABLET | ORAL | Status: DC

## 2015-10-04 NOTE — Telephone Encounter (Signed)
Notified patient Rx was ready for pick up on Monday.  She verbalized understanding.

## 2015-10-14 ENCOUNTER — Encounter: Payer: Self-pay | Admitting: Internal Medicine

## 2015-10-15 ENCOUNTER — Other Ambulatory Visit: Payer: Self-pay | Admitting: Internal Medicine

## 2015-10-15 MED ORDER — FLUOXETINE HCL 20 MG PO TABS: 20.0000 mg | ORAL_TABLET | Freq: Every day | ORAL | Status: DC

## 2015-10-18 ENCOUNTER — Encounter: Payer: Self-pay | Admitting: Family Medicine

## 2015-10-18 ENCOUNTER — Ambulatory Visit (INDEPENDENT_AMBULATORY_CARE_PROVIDER_SITE_OTHER): Payer: BC Managed Care – PPO | Admitting: Family Medicine

## 2015-10-18 VITALS — BP 116/72 | HR 94 | Temp 98.1°F | Ht 62.0 in | Wt 228.0 lb

## 2015-10-18 DIAGNOSIS — S39012A Strain of muscle, fascia and tendon of lower back, initial encounter: Secondary | ICD-10-CM

## 2015-10-18 DIAGNOSIS — M5489 Other dorsalgia: Secondary | ICD-10-CM

## 2015-10-18 DIAGNOSIS — R61 Generalized hyperhidrosis: Secondary | ICD-10-CM

## 2015-10-18 DIAGNOSIS — S338XXA Sprain of other parts of lumbar spine and pelvis, initial encounter: Secondary | ICD-10-CM

## 2015-10-18 DIAGNOSIS — K589 Irritable bowel syndrome without diarrhea: Secondary | ICD-10-CM | POA: Diagnosis not present

## 2015-10-18 DIAGNOSIS — IMO0001 Reserved for inherently not codable concepts without codable children: Secondary | ICD-10-CM

## 2015-10-18 LAB — POCT URINALYSIS DIPSTICK
Bilirubin, UA: NEGATIVE
Blood, UA: NEGATIVE
Glucose, UA: NEGATIVE
Ketones, UA: NEGATIVE
Leukocytes, UA: NEGATIVE
Nitrite, UA: NEGATIVE
Protein, UA: NEGATIVE
Spec Grav, UA: >=1.03
Urobilinogen, UA: 0.2
pH, UA: 5.5

## 2015-10-18 MED ORDER — IBUPROFEN 800 MG PO TABS: 800.0000 mg | ORAL_TABLET | Freq: Three times a day (TID) | ORAL | Status: DC | PRN

## 2015-10-18 NOTE — Progress Notes (Signed)
Pre visit review using our clinic review tool, if applicable. No additional management support is needed unless otherwise documented below in the visit note. 

## 2015-10-18 NOTE — Patient Instructions (Signed)
Nice to meet you. Your back discomfort is likely related to strain muscle. You should continue tizanidine and we will start you on ibuprofen. Please monitor the sweats that she been having. If they persist let us know. Please monitor the blood that you have with wiping your rectum. If it is persistent or increases please let us know. We'll have you complete stool cards to ensure that there is no blood in her stool. If you develop numbness, weakness, numbness between her legs, fever, loss of bowel or bladder function, increasing pain, fever, blood in stool, abdominal pain, or any new or changing symptoms please seek medical attention.

## 2015-10-20 ENCOUNTER — Encounter: Payer: Self-pay | Admitting: Internal Medicine

## 2015-10-20 NOTE — Assessment & Plan Note (Signed)
She notes increased sweating. This occurs at night. She has no respiratory symptoms to indicate pulmonary process causing this. Her vital signs are stable. She is not losing weight. Her most recent sedimentation rate in December was normal making this unlikely to be an inflammatory process. Most recent CBC and TSH were also normal so unlikely to be thyroid issues or hematologic malignancy. I discussed with patient obtaining further lab work to make sure these labs are stable and to evaluate for other causes such as menopause, though she declined these opting to continue to monitor. She will continue to monitor. Was given return precautions.

## 2015-10-20 NOTE — Progress Notes (Signed)
Patient ID: Selena [name], female   DOB: [birth year], 50 y.o.   MRN: [id number]  Selena Rumps, MD Phone: [phone]  Selena [name] is a 50 y.o. female who presents today for same-day visit.  Low back pain.: Patient notes over the last several days she has had low back pain. Notes it is a catching sensation in her low back. Switches from side to side. Bending over makes it worse and going from seated to standing makes it worse. No pain while seated. No pain while standing. Denies numbness, weakness, saddle anesthesia, fever, bowel and bladder incontinence, and a history of cancer. She notes nausea with this. She's not had any urinary frequency, urgency, dysuria, or hematuria.  Patient additionally notes earlier this week she had some diarrhea for 1-2 days. She notes there was blood on the tissue paper when she wiped, though no blood in the toilet bowl water or in the stool. No abdominal pain. No vomiting. No diarrhea at this time. She has a history of IBS. She does note a few sweats at night last couple weeks. No cough. No travel outside the country. No TB exposure. No sweats during the day. No weight loss. No palpitations. No history of thyroid dysfunction. She does have history colon polyps. She notes she does not have periods as she has an IUD in place.  PMH: nonsmoker.   ROS see history of present illness  Objective  Physical Exam Filed Vitals:   10/18/15 1601  BP: 116/72  Pulse: 94  Temp: 98.1 F (36.7 C)    BP Readings from Last 3 Encounters:  10/18/15 116/72  09/05/15 136/90  08/30/15 110/82   Wt Readings from Last 3 Encounters:  10/18/15 228 lb (103.42 kg)  09/05/15 222 lb 3.2 oz (100.789 kg)  08/30/15 218 lb (98.884 kg)    Physical Exam  Constitutional: She is well-developed, well-nourished, and in no distress.  HENT:  Head: Normocephalic and atraumatic.  Right Ear: External ear normal.  Left Ear: External ear normal.  Mouth/Throat: Oropharynx is clear  and moist. No oropharyngeal exudate.  Eyes: Conjunctivae are normal. Pupils are equal, round, and reactive to light.  Cardiovascular: Normal rate, regular rhythm and normal heart sounds.  Exam reveals no gallop and no friction rub.   No murmur heard. Pulmonary/Chest: Effort normal and breath sounds normal. No respiratory distress. She has no wheezes. She has no rales.  Abdominal: Soft. Bowel sounds are normal. She exhibits no distension. There is no tenderness. There is no rebound and no guarding.  Genitourinary:  Normal rectum, negative FOBT  Musculoskeletal:  No midline spine tenderness or step off, there is mild bilateral lumbar paraspinous muscle tenderness, there is no swelling or erythema of the back  Neurological: She is alert.  5 out of 5 strength in bilateral quads, hamstrings, plantar flexion, dorsiflexion, sensation light touch intact bilaterally in lower extremities, 2+ patellar reflexes  Skin: Skin is warm and dry. She is not diaphoretic.     Assessment/Plan: Please see individual problem list.  Lumbosacral strain Patient's history and exam most consistent with muscular back strain. No red flags. Lower extremities neurologically intact. UA was negative for blood making kidney stone unlikely. Patient will continue tizanidine that she already has at home. We'll treat with 800 mg ibuprofen. She's given return precautions.  IBS (irritable bowel syndrome) Suspect diarrhea is likely related to IBS, though could be related to food borne illness or viral illness. She did have a small amount of blood on wiping her rectum and  had a negative FOBT in the office today. She had a benign abdominal exam. She will continue to monitor for further diarrhea. We will give her stool cards to complete at home given her history of colon polyps. She is given return precautions.  Sweating She notes increased sweating. This occurs at night. She has no respiratory symptoms to indicate pulmonary process  causing this. Her vital signs are stable. She is not losing weight. Her most recent sedimentation rate in December was normal making this unlikely to be an inflammatory process. Most recent CBC and TSH were also normal so unlikely to be thyroid issues or hematologic malignancy. I discussed with patient obtaining further lab work to make sure these labs are stable and to evaluate for other causes such as menopause, though she declined these opting to continue to monitor. She will continue to monitor. Was given return precautions.    Orders Placed This Encounter  Procedures  . POCT Urinalysis Dipstick    Meds ordered this encounter  Medications  . ibuprofen (ADVIL,MOTRIN) 800 MG tablet    Sig: Take 1 tablet (800 mg total) by mouth every 8 (eight) hours as needed.    Dispense:  30 tablet    Refill:  0    Selena [name]

## 2015-10-20 NOTE — Assessment & Plan Note (Signed)
Suspect diarrhea is likely related to IBS, though could be related to food borne illness or viral illness. She did have a small amount of blood on wiping her rectum and had a negative FOBT in the office today. She had a benign abdominal exam. She will continue to monitor for further diarrhea. We will give her stool cards to complete at home given her history of colon polyps. She is given return precautions.

## 2015-10-20 NOTE — Assessment & Plan Note (Addendum)
Patient's history and exam most consistent with muscular back strain. No red flags. Lower extremities neurologically intact. UA was negative for blood making kidney stone unlikely. Patient will continue tizanidine that she already has at home. We'll treat with 800 mg ibuprofen. She's given return precautions.

## 2015-10-22 ENCOUNTER — Other Ambulatory Visit: Payer: Self-pay | Admitting: Family Medicine

## 2015-10-22 DIAGNOSIS — R61 Generalized hyperhidrosis: Secondary | ICD-10-CM

## 2015-10-29 ENCOUNTER — Other Ambulatory Visit (INDEPENDENT_AMBULATORY_CARE_PROVIDER_SITE_OTHER): Payer: BC Managed Care – PPO

## 2015-10-29 DIAGNOSIS — R61 Generalized hyperhidrosis: Secondary | ICD-10-CM

## 2015-10-30 ENCOUNTER — Other Ambulatory Visit: Payer: BC Managed Care – PPO

## 2015-10-30 ENCOUNTER — Telehealth: Payer: Self-pay | Admitting: *Deleted

## 2015-10-30 LAB — TSH: TSH: 0.99 u[IU]/mL (ref 0.35–4.50)

## 2015-10-30 LAB — CBC WITH DIFFERENTIAL/PLATELET
Basophils Absolute: 0.2 10*3/uL — ABNORMAL HIGH (ref 0.0–0.1)
Basophils Relative: 1.7 % (ref 0.0–3.0)
Eosinophils Absolute: 0.2 10*3/uL (ref 0.0–0.7)
Eosinophils Relative: 1.8 % (ref 0.0–5.0)
HCT: 39.2 % (ref 36.0–46.0)
Hemoglobin: 13.2 g/dL (ref 12.0–15.0)
Lymphocytes Relative: 29.3 % (ref 12.0–46.0)
Lymphs Abs: 2.7 10*3/uL (ref 0.7–4.0)
MCHC: 33.7 g/dL (ref 30.0–36.0)
MCV: 93.2 fl (ref 78.0–100.0)
Monocytes Absolute: 0.5 10*3/uL (ref 0.1–1.0)
Monocytes Relative: 5.3 % (ref 3.0–12.0)
Neutro Abs: 5.7 10*3/uL (ref 1.4–7.7)
Neutrophils Relative %: 61.9 % (ref 43.0–77.0)
Platelets: 338 10*3/uL (ref 150.0–400.0)
RBC: 4.2 Mil/uL (ref 3.87–5.11)
RDW: 12.9 % (ref 11.5–15.5)
WBC: 9.2 10*3/uL (ref 4.0–10.5)

## 2015-10-30 LAB — COMPREHENSIVE METABOLIC PANEL
ALT: 18 U/L (ref 0–35)
AST: 17 U/L (ref 0–37)
Albumin: 4.3 g/dL (ref 3.5–5.2)
Alkaline Phosphatase: 49 U/L (ref 39–117)
BUN: 20 mg/dL (ref 6–23)
CO2: 25 mEq/L (ref 19–32)
Calcium: 9.3 mg/dL (ref 8.4–10.5)
Chloride: 107 mEq/L (ref 96–112)
Creatinine, Ser: 0.88 mg/dL (ref 0.40–1.20)
GFR: 73.04 mL/min (ref >60.00)
Glucose, Bld: 88 mg/dL (ref 70–99)
Potassium: 3.6 mEq/L (ref 3.5–5.1)
Sodium: 140 mEq/L (ref 135–145)
Total Bilirubin: 0.3 mg/dL (ref 0.2–1.2)
Total Protein: 6.6 g/dL (ref 6.0–8.3)

## 2015-10-30 LAB — HIV ANTIBODY (ROUTINE TESTING W REFLEX): HIV 1&2 Ab, 4th Generation: NONREACTIVE

## 2015-10-30 LAB — FOLLICLE STIMULATING HORMONE: FSH: 19.3 m[IU]/mL

## 2015-10-30 LAB — LUTEINIZING HORMONE: LH: 11.83 m[IU]/mL

## 2015-10-30 LAB — SEDIMENTATION RATE: Sed Rate: 14 mm/hr (ref 0–22)

## 2015-10-30 NOTE — Telephone Encounter (Signed)
Noted  

## 2015-10-30 NOTE — Telephone Encounter (Signed)
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

## 2015-11-01 LAB — QUANTIFERON TB GOLD ASSAY (BLOOD)
Interferon Gamma Release Assay: NEGATIVE
Mitogen value: 7 IU/mL
Quantiferon Nil Value: 0.07 IU/mL
Quantiferon Tb Ag Minus Nil Value: -0.02 IU/mL
TB Ag value: 0.05 IU/mL

## 2015-11-04 ENCOUNTER — Other Ambulatory Visit: Payer: Self-pay | Admitting: Internal Medicine

## 2015-11-06 ENCOUNTER — Ambulatory Visit: Payer: BC Managed Care – PPO | Admitting: Neurology

## 2015-11-15 ENCOUNTER — Telehealth: Payer: Self-pay | Admitting: Neurology

## 2015-11-15 NOTE — Telephone Encounter (Signed)
PC with [name]--I advised that mri brain and c-spine didn't indicate a Chiari malformation.  She verbalized understanding of same.  RAS is ooo today, but I will pass her concerns along to him on Monday, and if he has anything else to add, will call her back.  I did offer her an appt. with RAS to discuss, but she declined./fim

## 2015-11-15 NOTE — Telephone Encounter (Signed)
Patient called to request that Dr. Felecia Shelling check her for Chiari Syndrome. Please call [phone].

## 2015-11-18 NOTE — Telephone Encounter (Signed)
I have spoken with Selena [name] again and advised that RAS has reviewed the mri's of her brain/c-spine again and confirmed there is no Chiari malformation.  She verbalized understanding of same/fim

## 2015-12-10 ENCOUNTER — Telehealth: Payer: BC Managed Care – PPO | Admitting: Family

## 2015-12-10 DIAGNOSIS — B9689 Other specified bacterial agents as the cause of diseases classified elsewhere: Secondary | ICD-10-CM

## 2015-12-10 DIAGNOSIS — J019 Acute sinusitis, unspecified: Secondary | ICD-10-CM

## 2015-12-10 MED ORDER — AMOXICILLIN-POT CLAVULANATE 875-125 MG PO TABS: 1.0000 | ORAL_TABLET | Freq: Two times a day (BID) | ORAL | Status: DC

## 2015-12-10 NOTE — Progress Notes (Signed)

## 2015-12-16 ENCOUNTER — Encounter: Payer: Self-pay | Admitting: Internal Medicine

## 2015-12-17 NOTE — Telephone Encounter (Signed)
Last OV 10/18/15 and patient is on 20 mg fluoxetine according to chart.

## 2015-12-18 MED ORDER — FLUOXETINE HCL 20 MG PO TABS: 30.0000 mg | ORAL_TABLET | Freq: Every day | ORAL | Status: DC

## 2015-12-19 ENCOUNTER — Telehealth: Payer: Self-pay | Admitting: Internal Medicine

## 2015-12-19 NOTE — Telephone Encounter (Addendum)
Patient notified and scheduled. Patient stated she was having some increased anxiety but she thought you wanted to see her in 2 weeks.

## 2015-12-19 NOTE — Telephone Encounter (Signed)
This patient needs an appt ASAP please see if she can be here tomorrow for 4:30 appointment   Her sister e mailed me about her having suicidal thoughts.

## 2015-12-20 ENCOUNTER — Ambulatory Visit (INDEPENDENT_AMBULATORY_CARE_PROVIDER_SITE_OTHER): Payer: BC Managed Care – PPO | Admitting: Internal Medicine

## 2015-12-20 ENCOUNTER — Encounter: Payer: Self-pay | Admitting: Internal Medicine

## 2015-12-20 VITALS — BP 120/80 | HR 81 | Temp 98.0°F | Resp 12 | Ht 62.0 in | Wt 225.8 lb

## 2015-12-20 DIAGNOSIS — R0789 Other chest pain: Secondary | ICD-10-CM

## 2015-12-20 DIAGNOSIS — E669 Obesity, unspecified: Secondary | ICD-10-CM

## 2015-12-20 DIAGNOSIS — F341 Dysthymic disorder: Secondary | ICD-10-CM | POA: Diagnosis not present

## 2015-12-20 MED ORDER — DIAZEPAM 10 MG PO TABS: 10.0000 mg | ORAL_TABLET | Freq: Every day | ORAL | Status: DC

## 2015-12-20 MED ORDER — DICYCLOMINE HCL 10 MG PO CAPS: 10.0000 mg | ORAL_CAPSULE | Freq: Three times a day (TID) | ORAL | Status: DC

## 2015-12-20 MED ORDER — CLONAZEPAM 1 MG PO TABS: 1.0000 mg | ORAL_TABLET | Freq: Every day | ORAL | Status: DC

## 2015-12-20 NOTE — Progress Notes (Signed)
Subjective:  Patient ID: Selena [name], female    DOB: 01/24/1966  Age: 50 y.o. MRN: [id number]  CC: The primary encounter diagnosis was Obesity, unspecified. Diagnoses of DEPRESSION/ANXIETY and Atypical chest pain were also pertinent to this visit.  HPI Selena [name] presents for increased anxiety accompanied by persistent chest pain.    1) The chest pain occurs at rest and affects the upper left chest and shoulder . No dyspnea .  Lasts  For up to 20 minutes,  Resolves spontaneously,  Does not occur at work.  She had a previous hospitalization at [facility] for same in  2009 .  Negative cardiac workup,  Told she has had pleurisy,  Costochondritis.   2) Anxiety: multiple stressors:  1)  Maternal Grandmother died in 2022/11/18)  Made the  to [place] and while there  had huge altercation with mother  Over political discussion .  "I just snapped" over mother's negative comments about President Obama.  3) Also had a brief affair with her estranged husband over the summer , then had a blowout with husband and severed the relationship completely. 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  5) Panicked by her situation of being 50 and alone .     Does not have a counsellor currently but last one  was too aggressive.   Using clonazepam  Once daily,  Used to use valium for daytime anxiety,   Not suicidal.  "i'm just scared."   3)  21 lb wt gain since March 2016 (had lost 15 lbs)  4) Loose stools occurring whenever she strays from strict low carb diet. Thinks she may be developing recurrence of esophageal stricture .      Outpatient Prescriptions Prior to Visit  Medication Sig Dispense Refill  . cetirizine (ZYRTEC) 10 MG tablet Take 10 mg by mouth daily.      . diclofenac (VOLTAREN) 75 MG EC tablet Take 1 tablet (75 mg total) by mouth 2 (two) times daily. 30 tablet 0  . FLUoxetine (PROZAC) 20 MG tablet Take 1.5 tablets (30 mg total) by mouth daily. 45 tablet 3   . levonorgestrel (MIRENA) 20 MCG/24HR IUD 1 Intra Uterine Device (1 each total) by Intrauterine route once. 1 each 0  . omeprazole (PRILOSEC) 20 MG capsule TAKE 1 TABLET (20 MG TOTAL) BY MOUTH DAILY. 30 capsule 11  . tiZANidine (ZANAFLEX) 4 MG tablet TAKE 1/2-1 TABLET BY MOUTH EVERY 8 HOURS AS NEEDED.  4  . topiramate (TOPAMAX) 50 MG tablet TAKE 2 TABLETS (100 MG TOTAL) BY MOUTH DAILY AT BEDTIME FOR MIGRAINE PREVENTION 60 tablet 1  . clonazePAM (KLONOPIN) 1 MG tablet TAKE 1 TABLET BY MOUTH AT BEDTIME AND 1/2 TABLET EVERY MORNING 45 tablet 3  . topiramate (TOPAMAX) 50 MG tablet     . diphenoxylate-atropine (LOMOTIL) 2.5-0.025 MG per tablet Take 1 tablet by mouth 4 (four) times daily as needed for diarrhea or loose stools. (Patient not taking: Reported on 12/20/2015) 30 tablet 0  . fluticasone (FLONASE) 50 MCG/ACT nasal spray Place 2 sprays into the nose daily. (Patient not taking: Reported on 12/20/2015) 16 g 6  . ibuprofen (ADVIL,MOTRIN) 800 MG tablet Take 1 tablet (800 mg total) by mouth every 8 (eight) hours as needed. (Patient not taking: Reported on 12/20/2015) 30 tablet 0  . phentermine (ADIPEX-P) 37.5 MG tablet 1/2 tablet in the am and early afternoon (Patient not taking: Reported on 12/20/2015) 30 tablet 0  .  rizatriptan (MAXALT) 10 MG tablet TAKE 1 TABLET (10 MG TOTAL) BY MOUTH AS NEEDED FOR MIGRAINE. MAY REPEAT IN 2 HOURS IF NEEDED (Patient not taking: Reported on 12/20/2015) 10 tablet 5  . amoxicillin-clavulanate (AUGMENTIN) 875-125 MG tablet Take 1 tablet by mouth 2 (two) times daily. 14 tablet 0   No facility-administered medications prior to visit.    Review of Systems;  Patient denies headache, fevers, malaise, unintentional weight loss, skin rash, eye pain, sinus congestion and sinus pain, sore throat, dysphagia,  hemoptysis , cough, dyspnea, wheezing, chest pain, palpitations, orthopnea, edema, abdominal pain, nausea, melena, diarrhea, constipation, flank pain, dysuria, hematuria,  urinary  Frequency, nocturia, numbness, tingling, seizures,  Focal weakness, Loss of consciousness,  Tremor, insomnia, depression, anxiety, and suicidal ideation.      Objective:  BP 120/80 mmHg  Pulse 81  Temp(Src) 98 F (36.7 C) (Oral)  Resp 12  Ht 5\' 2"  (1.575 m)  Wt 225 lb 12 oz (102.4 kg)  BMI 41.28 kg/m2  SpO2 97%  BP Readings from Last 3 Encounters:  12/20/15 120/80  10/18/15 116/72  09/05/15 136/90    Wt Readings from Last 3 Encounters:  12/20/15 225 lb 12 oz (102.4 kg)  10/18/15 228 lb (103.42 kg)  09/05/15 222 lb 3.2 oz (100.789 kg)    General appearance: alert, cooperative and appears stated age Ears: normal TM's and external ear canals both ears Throat: lips, mucosa, and tongue normal; teeth and gums normal Neck: no adenopathy, no carotid bruit, supple, symmetrical, trachea midline and thyroid not enlarged, symmetric, no tenderness/mass/nodules Back: symmetric, no curvature. ROM normal. No CVA tenderness. Lungs: clear to auscultation bilaterally Heart: regular rate and rhythm, S1, S2 normal, no murmur, click, rub or gallop Abdomen: soft, non-tender; bowel sounds normal; no masses,  no organomegaly Pulses: 2+ and symmetric Skin: Skin color, texture, turgor normal. No rashes or lesions Lymph nodes: Cervical, supraclavicular, and axillary nodes normal. Psych: affect depressed/anxious, makes good eye contact. No fidgeting,  tearful.  Denies suicidal thoughts    Lab Results  Component Value Date   HGBA1C 5.8 04/16/2014   HGBA1C 5.7 07/24/2013   HGBA1C 5.5 01/30/2009    Lab Results  Component Value Date   CREATININE 0.88 10/29/2015   CREATININE 0.78 04/26/2015   CREATININE 0.9 04/16/2014    Lab Results  Component Value Date   WBC 9.2 10/29/2015   HGB 13.2 10/29/2015   HCT 39.2 10/29/2015   PLT 338.0 10/29/2015   GLUCOSE 88 10/29/2015   CHOL 229* 04/26/2015   TRIG 212.0* 04/26/2015   HDL 44.70 04/26/2015   LDLDIRECT 158.0 04/26/2015   LDLCALC  154* 04/16/2014   ALT 18 10/29/2015   AST 17 10/29/2015   NA 140 10/29/2015   K 3.6 10/29/2015   CL 107 10/29/2015   CREATININE 0.88 10/29/2015   BUN 20 10/29/2015   CO2 25 10/29/2015   TSH 0.99 10/29/2015   INR 1.0 07/13/2008   HGBA1C 5.8 04/16/2014   MICROALBUR 0.50 06/17/2012     Assessment & Plan:   Problem List Items Addressed This Visit    OBESITY, UNSPECIFIED - Primary    Secondary to lack of exercise and lack of adherence to diet.  I have addressed  BMI and recommended wt loss of 10% of body weigh over the next 6 months using a low glycemic index diet and regular exercise a minimum of 5 days per week.        DEPRESSION/ANXIETY    Aggravated by multiple life stressors  she does not appear to be handling well. Referral to psychologists,  increase prozac.  Screened for suicidality .  She is not suicidal, mainly due to concern for adolescent son.       Relevant Medications   diazepam (VALIUM) 10 MG tablet   Atypical chest pain    Recurrent daily, at rest,  Aggravated by anxiety.  Trial of valium. Records from prior hospitalization requested.       A total of 40 minutes was spent with patient more than half of which was spent in counseling patient on the above mentioned issues , reviewing and explaining recent labs and imaging studies done, and coordination of care.   I have discontinued Ms. Gianino's amoxicillin-clavulanate. I have also changed her clonazePAM. Additionally, I am having her start on diazepam and dicyclomine. Lastly, I am having her maintain her cetirizine, fluticasone, levonorgestrel, rizatriptan, omeprazole, diphenoxylate-atropine, tiZANidine, diclofenac, phentermine, ibuprofen, topiramate, and FLUoxetine.  Meds ordered this encounter  Medications  . clonazePAM (KLONOPIN) 1 MG tablet    Sig: Take 1 tablet (1 mg total) by mouth at bedtime.    Dispense:  30 tablet    Refill:  3    This request is for a new prescription for a controlled substance as  required by Federal/State law.  . diazepam (VALIUM) 10 MG tablet    Sig: Take 1 tablet (10 mg total) by mouth daily. As needed for anxiety and muscle spasm    Dispense:  30 tablet    Refill:  3  . dicyclomine (BENTYL) 10 MG capsule    Sig: Take 1 capsule (10 mg total) by mouth 4 (four) times daily -  before meals and at bedtime.    Dispense:  120 capsule    Refill:  0    Medications Discontinued During This Encounter  Medication Reason  . amoxicillin-clavulanate (AUGMENTIN) 875-125 MG tablet Completed Course  . topiramate (TOPAMAX) 50 MG tablet Duplicate  . clonazePAM (KLONOPIN) 1 MG tablet Reorder    Follow-up: No Follow-up on file.   [name], MD

## 2015-12-20 NOTE — Patient Instructions (Addendum)
Here are the names of several well respected female therapists   Lennon Alstrom    [phone] [place]   [phone] [place] [phone]  Achilles Dunk  [phone]  [place]   I am prescribing valium for you to use during teh day once daily  Continue clonazepam at bedtime   ADDING dicyclomine , an antispasmodic to take before meals to prevent diarrha

## 2015-12-20 NOTE — Progress Notes (Signed)
Pre-visit discussion using our clinic review tool. No additional management support is needed unless otherwise documented below in the visit note.  

## 2015-12-22 DIAGNOSIS — R0789 Other chest pain: Secondary | ICD-10-CM | POA: Insufficient documentation

## 2015-12-22 NOTE — Assessment & Plan Note (Signed)
Recurrent daily, at rest,  Aggravated by anxiety.  Trial of valium. Records from prior hospitalization requested.

## 2015-12-22 NOTE — Assessment & Plan Note (Signed)
Secondary to lack of exercise and lack of adherence to diet.  I have addressed  BMI and recommended wt loss of 10% of body weigh over the next 6 months using a low glycemic index diet and regular exercise a minimum of 5 days per week.

## 2015-12-22 NOTE — Assessment & Plan Note (Signed)
Aggravated by multiple life stressors she does not appear to be handling well. Referral to psychologists,  increase prozac.  Screened for suicidality .  She is not suicidal, mainly due to concern for adolescent son.

## 2015-12-29 ENCOUNTER — Encounter: Payer: Self-pay | Admitting: Internal Medicine

## 2016-01-03 ENCOUNTER — Other Ambulatory Visit: Payer: Self-pay | Admitting: Internal Medicine

## 2016-01-03 NOTE — Telephone Encounter (Signed)
Refilled in 10/2015 and last seen 12/2015. Please advise?

## 2016-01-03 NOTE — Telephone Encounter (Signed)
Ok to refill,  Refill sent  

## 2016-01-06 ENCOUNTER — Encounter: Payer: Self-pay | Admitting: Internal Medicine

## 2016-01-07 NOTE — Telephone Encounter (Signed)
Patient need to be seen  

## 2016-01-16 ENCOUNTER — Other Ambulatory Visit: Payer: Self-pay | Admitting: Internal Medicine

## 2016-01-16 NOTE — Telephone Encounter (Signed)
refilled 

## 2016-02-06 ENCOUNTER — Other Ambulatory Visit: Payer: Self-pay | Admitting: Internal Medicine

## 2016-03-07 ENCOUNTER — Other Ambulatory Visit: Payer: Self-pay | Admitting: Internal Medicine

## 2016-03-12 ENCOUNTER — Other Ambulatory Visit: Payer: Self-pay | Admitting: Internal Medicine

## 2016-03-12 NOTE — Telephone Encounter (Signed)
refilled 

## 2016-03-12 NOTE — Telephone Encounter (Signed)
Ok to fill Klonopin and Topamax last Visit 3/17

## 2016-03-20 NOTE — Telephone Encounter (Signed)
error 

## 2016-04-08 ENCOUNTER — Telehealth: Payer: Self-pay | Admitting: *Deleted

## 2016-04-08 ENCOUNTER — Telehealth: Payer: Self-pay | Admitting: Internal Medicine

## 2016-04-08 NOTE — Telephone Encounter (Signed)
Patient has re-occurent migraines and has HX of migraines with facial tingling just right around the right eye going back of head the same as in December. Patient declining ER because there is no way of knowing wether sam as in the past to go to ER patient stated OK she would go. FYI

## 2016-04-08 NOTE — Telephone Encounter (Signed)
Patient transferred to the nurse line for migraine and numbness

## 2016-04-08 NOTE — Telephone Encounter (Signed)
Patient Name: Selena [name] DOB: 1965/12/08 Initial Comment Caller states the right side of her face is numb all of a sudden. Migraine as well. Nurse Assessment Nurse: Vallery Sa, RN, Cathy Date/Time (Eastern Time): 04/08/2016 12:41:34 PM Confirm and document reason for call. If symptomatic, describe symptoms. You must click the next button to save text entered. ---Caller states she developed right sided facial numbness yesterday and a headache (rated as an 8 on the 1 to 10 scale) this morning. No injury in the past 3 days. No severe breathing or swallowing difficulty. Alert and responsive. No fever. Has the patient traveled out of the country within the last 30 days? ---No Does the patient have any new or worsening symptoms? ---Yes Will a triage be completed? ---Yes Related visit to physician within the last 2 weeks? ---No Does the PT have any chronic conditions? (i.e. diabetes, asthma, etc.) ---Yes List chronic conditions. 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 (Ask all females between the ages of 8-55) ---No Is this a behavioral health or substance abuse call? ---No Guidelines Guideline Title Affirmed Question Affirmed Notes Neurologic Deficit [1] Numbness (i.e., loss of sensation) of the face, arm / hand, or leg / foot on one side of the body AND [2] sudden onset AND [3] present now Final Disposition User Call EMS 911 Now [place], RN, [place] declined the Call 911 disposition and she doesn't want to go to the ER because this happened about a year ago. Reinforced the disposition and she declined. Called the office backline and notified Cathy. Connected [name] and [place] together. . Referrals [facility] - ED Disagree/Comply: Disagree Disagree/Comply Reason: Disagree with instructions

## 2016-04-09 ENCOUNTER — Other Ambulatory Visit: Payer: Self-pay | Admitting: *Deleted

## 2016-04-09 MED ORDER — FLUOXETINE HCL 20 MG PO TABS: 30.0000 mg | ORAL_TABLET | Freq: Every day | ORAL | Status: DC

## 2016-04-09 NOTE — Progress Notes (Signed)
Script filled for prozac.

## 2016-04-20 ENCOUNTER — Encounter: Payer: Self-pay | Admitting: Family Medicine

## 2016-04-20 ENCOUNTER — Ambulatory Visit (INDEPENDENT_AMBULATORY_CARE_PROVIDER_SITE_OTHER): Payer: BC Managed Care – PPO | Admitting: Family Medicine

## 2016-04-20 DIAGNOSIS — G43009 Migraine without aura, not intractable, without status migrainosus: Secondary | ICD-10-CM

## 2016-04-20 DIAGNOSIS — G43809 Other migraine, not intractable, without status migrainosus: Secondary | ICD-10-CM | POA: Diagnosis not present

## 2016-04-20 MED ORDER — DEXAMETHASONE SODIUM PHOSPHATE 10 MG/ML IJ SOLN
10.0000 mg | Freq: Once | INTRAMUSCULAR | Status: AC
  Administered 2016-04-20: 10 mg via INTRAMUSCULAR

## 2016-04-20 MED ORDER — RIZATRIPTAN BENZOATE 10 MG PO TABS: ORAL_TABLET | ORAL | Status: DC

## 2016-04-20 NOTE — Progress Notes (Signed)
Subjective:  Patient ID: Selena [name], female    DOB: 1965/12/08  Age: 50 y.o. MRN: [id number]  CC: Migraine  HPI:  50 year old female with a past medical history of migraine presents with complaints of migraine.  Patient reports that she's had a migraine for the past 4-5 days. Pain is located around the right eye. She has taken her Maxalt without resolution. Additionally, patient states that she has had a recurrence of right-sided facial numbness/tingling. She has had this in the past. She previously had a complete and thorough workup which was negative. Of note, patient notes that she's been under stress as of late. She has recently went through a divorce. Patient states that she has been grinding her teeth frequently (secondary to stress) and thinks this may be contributing. No other complaints at this time.  Social Hx   Social History   Social History  . Marital Status: Married    Spouse Name: N/A  . Number of Children: 1  . Years of Education: N/A   Occupational History  . Teaches preschool    Social History Main Topics  . Smoking status: Never Smoker   . Smokeless tobacco: Never Used  . Alcohol Use: Yes     Comment: 4 Times a month  . Drug Use: No  . Sexual Activity: Yes    Birth Control/ Protection: IUD   Other Topics Concern  . None   Social History Narrative   2 Step children; asthma, ADHD   Review of Systems  Constitutional: Negative.   Neurological:       Migraine. Facial paresthesia.   Objective:  BP 150/96 mmHg  Pulse 95  Temp(Src) 98.5 F (36.9 C) (Oral)  Wt 223 lb 4 oz (101.266 kg)  SpO2 96%  BP/Weight 04/20/2016 12/20/2015 [id number]  Systolic BP [id number]  Diastolic BP 96 80 72  Wt. (Lbs) 223.25 225.75 228  BMI 40.82 41.28 41.69   Physical Exam  Constitutional: She is oriented to person, place, and time. She appears well-developed. No distress.  Cardiovascular: Normal rate and regular rhythm.   Pulmonary/Chest: Effort normal. She has  no wheezes. She has no rales.  Neurological: She is alert and oriented to person, place, and time. No cranial nerve deficit. Coordination normal.  CN 2-12 intact. Normal muscle strength. No focal deficits.  Psychiatric: She has a normal mood and affect.  Vitals reviewed.   Lab Results  Component Value Date   WBC 9.2 10/29/2015   HGB 13.2 10/29/2015   HCT 39.2 10/29/2015   PLT 338.0 10/29/2015   GLUCOSE 88 10/29/2015   CHOL 229* 04/26/2015   TRIG 212.0* 04/26/2015   HDL 44.70 04/26/2015   LDLDIRECT 158.0 04/26/2015   LDLCALC 154* 04/16/2014   ALT 18 10/29/2015   AST 17 10/29/2015   NA 140 10/29/2015   K 3.6 10/29/2015   CL 107 10/29/2015   CREATININE 0.88 10/29/2015   BUN 20 10/29/2015   CO2 25 10/29/2015   TSH 0.99 10/29/2015   INR 1.0 07/13/2008   HGBA1C 5.8 04/16/2014   MICROALBUR 0.50 06/17/2012    Assessment & Plan:   Problem List Items Addressed This Visit    Migraine without aura    Acute migraine. Exam unremarkable. No focal deficits.  Treating with IM Decadron. Maxalt refilled. Patient to follow up with PCP.      Relevant Medications   rizatriptan (MAXALT) 10 MG tablet   dexamethasone (DECADRON) injection 10 mg (Completed)      Meds ordered  this encounter  Medications  . rizatriptan (MAXALT) 10 MG tablet    Sig: TAKE 1 TABLET (10 MG TOTAL) BY MOUTH AS NEEDED FOR MIGRAINE. MAY REPEAT IN 2 HOURS IF NEEDED    Dispense:  10 tablet    Refill:  5  . dexamethasone (DECADRON) injection 10 mg    Sig:     Follow-up: PRN  [place]

## 2016-04-20 NOTE — Progress Notes (Signed)
Pre visit review using our clinic review tool, if applicable. No additional management support is needed unless otherwise documented below in the visit note. 

## 2016-04-20 NOTE — Assessment & Plan Note (Signed)
Acute migraine. Exam unremarkable. No focal deficits.  Treating with IM Decadron. Maxalt refilled. Patient to follow up with PCP.

## 2016-04-20 NOTE — Patient Instructions (Signed)
Continue your current meds.   Follow up closely with Dr. Derrel Nip  Take care  Dr. Lacinda Axon

## 2016-04-21 ENCOUNTER — Telehealth: Payer: Self-pay | Admitting: *Deleted

## 2016-04-21 ENCOUNTER — Other Ambulatory Visit: Payer: Self-pay | Admitting: Internal Medicine

## 2016-04-21 MED ORDER — KETOROLAC TROMETHAMINE 10 MG PO TABS: 10.0000 mg | ORAL_TABLET | Freq: Four times a day (QID) | ORAL | Status: DC | PRN

## 2016-04-21 MED ORDER — PROMETHAZINE HCL 12.5 MG PO TABS: 12.5000 mg | ORAL_TABLET | Freq: Three times a day (TID) | ORAL | Status: DC | PRN

## 2016-04-21 NOTE — Telephone Encounter (Signed)
Patient was seen in the office on 07/17 for cluster headache. She stated that she received a steroid injection, this is usually something she does not take. She requested a consult call, she stated that the steroid kelp her up all night and she still have the headache,. Pt contact [phone]

## 2016-04-21 NOTE — Telephone Encounter (Signed)
Spoke with the patient, she is good with trying the medications orally, please send to [facility] in [place], thanks

## 2016-04-21 NOTE — Telephone Encounter (Signed)
I did not see this message until just now,  I can send the phenergan and toradol medication to her pharmacy but it is too late to give her an injection

## 2016-04-21 NOTE — Telephone Encounter (Signed)
Patient was seen yesterday and received a Dexadron injection for a cluster headache.  Once she got home, headache increased, went to the back of head and has now gone back around the front.  She of course got extremely anxious, and her heart rate went up, was sweating, used cold compresses to try and calm done, didn't sleep much.  I explained that a IM steroid will have some of those side effects.  She is requesting to have the treatment that she had last time she had a headache, per the chart she was given phenergan and Toradol.  Please advise if this is possible for her to return and get as the headache remains. thanks

## 2016-05-18 ENCOUNTER — Telehealth: Payer: Self-pay | Admitting: Internal Medicine

## 2016-05-18 NOTE — Telephone Encounter (Signed)
Please review where to work in if [employer], [employer]

## 2016-05-18 NOTE — Telephone Encounter (Signed)
Selena [name],  you can give her an 11:30 or 4:30

## 2016-05-18 NOTE — Telephone Encounter (Signed)
Ms. Singhal called saying her teeth grinding has gotten significantly worse to the point that she grinded up a $600 mouth piece her dentist made for her in less than a week. Her dentist suggested she make an appt with Dr. Derrel Nip to see if her anxiety medication needs to be increased. They asked if she had problems sleeping or Restless Leg Syndrome. She's currently scheduled in September to see Dr. Derrel Nip but is wondering if she can be worked in sooner. She'd like a phone call regarding this.  Pt's ph# [phone]

## 2016-05-19 NOTE — Telephone Encounter (Signed)
Spoke with patient and scheduled for tomorrow at 1130, thanks

## 2016-05-20 ENCOUNTER — Ambulatory Visit (INDEPENDENT_AMBULATORY_CARE_PROVIDER_SITE_OTHER): Payer: BC Managed Care – PPO | Admitting: Internal Medicine

## 2016-05-20 VITALS — BP 128/90 | HR 79 | Temp 98.2°F | Resp 12 | Ht 63.0 in | Wt 225.5 lb

## 2016-05-20 DIAGNOSIS — E785 Hyperlipidemia, unspecified: Secondary | ICD-10-CM

## 2016-05-20 DIAGNOSIS — G2581 Restless legs syndrome: Secondary | ICD-10-CM

## 2016-05-20 DIAGNOSIS — F411 Generalized anxiety disorder: Secondary | ICD-10-CM | POA: Diagnosis not present

## 2016-05-20 DIAGNOSIS — R7301 Impaired fasting glucose: Secondary | ICD-10-CM

## 2016-05-20 MED ORDER — PRAMIPEXOLE DIHYDROCHLORIDE 0.125 MG PO TABS: ORAL_TABLET | ORAL | 3 refills | Status: DC

## 2016-05-20 MED ORDER — DIAZEPAM 10 MG PO TABS: 10.0000 mg | ORAL_TABLET | Freq: Two times a day (BID) | ORAL | 3 refills | Status: DC | PRN

## 2016-05-20 MED ORDER — FLUOXETINE HCL 20 MG PO TABS: 40.0000 mg | ORAL_TABLET | Freq: Every day | ORAL | 3 refills | Status: DC

## 2016-05-20 NOTE — Progress Notes (Signed)
Subjective:  Patient ID: Selena [name], female    DOB: [birth year]  Age: 50 y.o. MRN: [id number]  CC: The primary encounter diagnosis was Hyperlipidemia. Diagnoses of Impaired fasting glucose, Restless legs syndrome, and Generalized anxiety disorder were also pertinent to this visit.  HPI Clariza [name] presents for management of worsening anxiety,  Patient noted to be grinding her teeth at night and breaking her mouth molds.    Clenching teeth in the daytime.  Does not feel anxious or depressed . But walks around  with teeth clenched.  Taking tizanidine and clonazepam with no help ,  Chewed up a $600 mouth mold  using clonazepam only at night .  increasing the prozac made her feel great for a while but the improvement was transient .  Has started having numbness in face again.  Has seen neurology    Outpatient Medications Prior to Visit  Medication Sig Dispense Refill  . cetirizine (ZYRTEC) 10 MG tablet Take 10 mg by mouth daily.      . diclofenac (VOLTAREN) 75 MG EC tablet Take 1 tablet (75 mg total) by mouth 2 (two) times daily. 30 tablet 0  . ibuprofen (ADVIL,MOTRIN) 800 MG tablet Take 1 tablet (800 mg total) by mouth every 8 (eight) hours as needed. 30 tablet 0  . ketorolac (TORADOL) 10 MG tablet Take 1 tablet (10 mg total) by mouth every 6 (six) hours as needed. 20 tablet 0  . levonorgestrel (MIRENA) 20 MCG/24HR IUD 1 Intra Uterine Device (1 each total) by Intrauterine route once. 1 each 0  . promethazine (PHENERGAN) 12.5 MG tablet Take 1 tablet (12.5 mg total) by mouth every 8 (eight) hours as needed for nausea or vomiting. 20 tablet 0  . rizatriptan (MAXALT) 10 MG tablet TAKE 1 TABLET (10 MG TOTAL) BY MOUTH AS NEEDED FOR MIGRAINE. MAY REPEAT IN 2 HOURS IF NEEDED 10 tablet 5  . topiramate (TOPAMAX) 50 MG tablet TAKE 2 TABLETS (100 MG TOTAL) BY MOUTH DAILY AT BEDTIME FOR MIGRAINE PREVENTION 60 tablet 3  . clonazePAM (KLONOPIN) 1 MG tablet TAKE 1 TABLET BY MOUTH AT BEDTIME AND  1/2 TABLET EVERY MORNING 45 tablet 2  . FLUoxetine (PROZAC) 20 MG tablet Take 1.5 tablets (30 mg total) by mouth daily. 45 tablet 3  . diazepam (VALIUM) 10 MG tablet Take 1 tablet (10 mg total) by mouth daily. As needed for anxiety and muscle spasm (Patient not taking: Reported on 05/20/2016) 30 tablet 3   No facility-administered medications prior to visit.     Review of Systems;  Patient denies headache, fevers, malaise, unintentional weight loss, skin rash, eye pain, sinus congestion and sinus pain, sore throat, dysphagia,  hemoptysis , cough, dyspnea, wheezing, chest pain, palpitations, orthopnea, edema, abdominal pain, nausea, melena, diarrhea, constipation, flank pain, dysuria, hematuria, urinary  Frequency, nocturia, numbness, tingling, seizures,  Focal weakness, Loss of consciousness,  Tremor, insomnia, depression, anxiety, and suicidal ideation.      Objective:  BP 128/90   Pulse 79   Temp 98.2 F (36.8 C) (Oral)   Resp 12   Ht 5\' 3"  (1.6 m)   Wt 225 lb 8 oz (102.3 kg)   SpO2 98%   BMI 39.95 kg/m   BP Readings from Last 3 Encounters:  05/20/16 128/90  04/20/16 (!) 150/96  12/20/15 120/80    Wt Readings from Last 3 Encounters:  05/20/16 225 lb 8 oz (102.3 kg)  04/20/16 223 lb 4 oz (101.3 kg)  12/20/15 225 lb 12 oz (102.4  kg)    General appearance: alert, cooperative and appears stated age Ears: normal TM's and external ear canals both ears Throat: lips, mucosa, and tongue normal; teeth and gums normal Neck: no adenopathy, no carotid bruit, supple, symmetrical, trachea midline and thyroid not enlarged, symmetric, no tenderness/mass/nodules Back: symmetric, no curvature. ROM normal. No CVA tenderness. Lungs: clear to auscultation bilaterally Heart: regular rate and rhythm, S1, S2 normal, no murmur, click, rub or gallop Abdomen: soft, non-tender; bowel sounds normal; no masses,  no organomegaly Pulses: 2+ and symmetric Skin: Skin color, texture, turgor normal. No  rashes or lesions Lymph nodes: Cervical, supraclavicular, and axillary nodes normal.  Lab Results  Component Value Date   HGBA1C 5.5 05/21/2016   HGBA1C 5.8 04/16/2014   HGBA1C 5.7 07/24/2013    Lab Results  Component Value Date   CREATININE 0.89 05/21/2016   CREATININE 0.88 10/29/2015   CREATININE 0.78 04/26/2015    Lab Results  Component Value Date   WBC 9.2 10/29/2015   HGB 13.2 10/29/2015   HCT 39.2 10/29/2015   PLT 338.0 10/29/2015   GLUCOSE 96 05/21/2016   CHOL 229 (H) 04/26/2015   TRIG 166 (H) 05/21/2016   HDL 42 (L) 05/21/2016   LDLDIRECT 171 (H) 05/21/2016   LDLCALC 154 (H) 04/16/2014   ALT 17 05/21/2016   AST 14 05/21/2016   NA 138 05/21/2016   K 3.6 05/21/2016   CL 109 05/21/2016   CREATININE 0.89 05/21/2016   BUN 13 05/21/2016   CO2 22 05/21/2016   TSH 0.99 10/29/2015   INR 1.0 07/13/2008   HGBA1C 5.5 05/21/2016   MICROALBUR 0.50 06/17/2012    Mr Cervical Spine Wo Contrast  Result Date: 08/23/2015 CLINICAL DATA:  50 year old female with right greater than left cervical neck pain, right face numbness radiating to the right arm. Symptoms for 6 weeks with no known injury. Initial encounter. EXAM: MRI CERVICAL SPINE WITHOUT CONTRAST TECHNIQUE: Multiplanar, multisequence MR imaging of the cervical spine was performed. No intravenous contrast was administered. COMPARISON:  [place] cervical spine radiographs 07/27/2013. [facility] Brain MRI 01/12/2011. FINDINGS: Cervical vertebral height and alignment stable from the 2014 radiographs with mild reversal of lordosis, and trace anterolisthesis at both C2-C3 and C3-C4. There is associated upper cervical facet degeneration greater on the right (see below). No marrow edema or evidence of acute osseous abnormality. Cervicomedullary junction is within normal limits. No spinal cord signal abnormality identified. Negative paraspinal soft tissues. C2-C3: Mild to moderate facet hypertrophy greater on the  right. Minimal disc bulge. Mild uncovertebral hypertrophy. No spinal stenosis. Mild right C3 foraminal stenosis. C3-C4: Moderate to severe facet hypertrophy on the right, mild on the left. Fluid in the right facet joint which is capacious (series 8, image 25 and series 9, image 2). No surrounding inflammation. Mild uncovertebral hypertrophy. Mild disc bulge. Borderline to mild spinal stenosis. No left foraminal stenosis. Moderate right C4 foraminal stenosis. C4-C5: Trace anterolisthesis at this level also. Moderate facet hypertrophy greater on the right. Minimal disc bulge. No spinal stenosis. Mild if any right side C5 foraminal stenosis. C5-C6:  Mild disc bulge.  No stenosis. C6-C7: Mild to moderate facet hypertrophy greater on the left. Mild left C7 foraminal stenosis. C7-T1: Moderate bilateral facet hypertrophy. Mild bilateral C8 foraminal stenosis. No upper thoracic spinal stenosis. IMPRESSION: 1. Dominant finding in the cervical spine is facet arthropathy, maximal on the right at C3-C4 where facet joint fluid is noted but no other active inflammation. This facet disease occurs in the setting  of chronic multilevel mild spondylolisthesis. 2. Associated moderate right C4 foraminal stenosis and up to mild spinal stenosis at C3-C4. Intermittent mild cervical foraminal stenosis elsewhere. Electronically Signed   By: Genevie Ann M.D.   On: 08/23/2015 13:55    Assessment & Plan:   Problem List Items Addressed This Visit    Generalized anxiety disorder    increased prozac to 40 ng daily,  Substituting valium 4 mg bid prn for clonazepam       Restless legs syndrome    Trial of requip was not tolerated.  Trial of mirapex.       Other Visit Diagnoses    Hyperlipidemia    -  Primary   Relevant Orders   Lipid Panel w/Direct LDL:HDL Ratio (Completed)   Impaired fasting glucose       Relevant Orders   Hemoglobin A1c (Completed)   Comprehensive metabolic panel (Completed)     A total of 25 minutes of face  to face time was spent with patient more than half of which was spent in counselling about the above mentioned conditions  and coordination of care   I have discontinued Ms. Doughman's clonazePAM. I have also changed her diazepam and FLUoxetine. Additionally, I am having her start on pramipexole. Lastly, I am having her maintain her cetirizine, levonorgestrel, diclofenac, ibuprofen, topiramate, rizatriptan, ketorolac, and promethazine.  Meds ordered this encounter  Medications  . diazepam (VALIUM) 10 MG tablet    Sig: Take 1 tablet (10 mg total) by mouth every 12 (twelve) hours as needed for anxiety. As needed for anxiety and muscle spasm    Dispense:  60 tablet    Refill:  3  . pramipexole (MIRAPEX) 0.125 MG tablet    Sig: 1 tablet daily at bedtime,  Increase weekly as needed    Dispense:  90 tablet    Refill:  3  . FLUoxetine (PROZAC) 20 MG tablet    Sig: Take 2 tablets (40 mg total) by mouth daily.    Dispense:  60 tablet    Refill:  3    Medications Discontinued During This Encounter  Medication Reason  . clonazePAM (KLONOPIN) 1 MG tablet   . diazepam (VALIUM) 10 MG tablet Reorder  . FLUoxetine (PROZAC) 20 MG tablet Reorder    Follow-up: Return in about 4 weeks (around 06/17/2016), or fasting labs ASAP.   [name], MD

## 2016-05-20 NOTE — Progress Notes (Signed)
Pre-visit discussion using our clinic review tool. No additional management support is needed unless otherwise documented below in the visit note.  

## 2016-05-20 NOTE — Patient Instructions (Addendum)
Stop the clonazepam  Use the valium in the morning and In the evening (start with 5 mg in the am )   We are adding a medication for restless legs at night .  You can increas the dose every week to a max of 3 ,  Let me know  Via mychart   We will increase the prozac to 40 mg daily

## 2016-05-21 ENCOUNTER — Other Ambulatory Visit (INDEPENDENT_AMBULATORY_CARE_PROVIDER_SITE_OTHER): Payer: BC Managed Care – PPO

## 2016-05-21 DIAGNOSIS — E785 Hyperlipidemia, unspecified: Secondary | ICD-10-CM | POA: Diagnosis not present

## 2016-05-21 DIAGNOSIS — R7301 Impaired fasting glucose: Secondary | ICD-10-CM | POA: Diagnosis not present

## 2016-05-21 LAB — COMPREHENSIVE METABOLIC PANEL
ALT: 17 U/L (ref 0–35)
AST: 14 U/L (ref 0–37)
Albumin: 4.1 g/dL (ref 3.5–5.2)
Alkaline Phosphatase: 55 U/L (ref 39–117)
BUN: 13 mg/dL (ref 6–23)
CO2: 22 mEq/L (ref 19–32)
Calcium: 8.8 mg/dL (ref 8.4–10.5)
Chloride: 109 mEq/L (ref 96–112)
Creatinine, Ser: 0.89 mg/dL (ref 0.40–1.20)
GFR: 71.92 mL/min (ref >60.00)
Glucose, Bld: 96 mg/dL (ref 70–99)
Potassium: 3.6 mEq/L (ref 3.5–5.1)
Sodium: 138 mEq/L (ref 135–145)
Total Bilirubin: 0.3 mg/dL (ref 0.2–1.2)
Total Protein: 6.6 g/dL (ref 6.0–8.3)

## 2016-05-21 LAB — HEMOGLOBIN A1C: Hgb A1c MFr Bld: 5.5 % (ref 4.6–6.5)

## 2016-05-22 LAB — LIPID PANEL W/DIRECT LDL/HDL RATIO
Cholesterol: 232 mg/dL — ABNORMAL HIGH (ref 125–200)
Direct LDL: 171 mg/dL — ABNORMAL HIGH (ref <130)
HDL: 42 mg/dL — ABNORMAL LOW (ref >=46)
LDL:HDL Ratio: 4.1 Ratio
Total Chol/HDL Ratio: 5.5 Ratio — ABNORMAL HIGH (ref <=5.0)
Triglycerides: 166 mg/dL — ABNORMAL HIGH (ref <150)

## 2016-05-23 ENCOUNTER — Encounter: Payer: Self-pay | Admitting: Internal Medicine

## 2016-05-23 NOTE — Assessment & Plan Note (Signed)
increased prozac to 40 ng daily,  Substituting valium 4 mg bid prn for clonazepam

## 2016-05-23 NOTE — Assessment & Plan Note (Addendum)
Trial of requip was not tolerated.  Trial of mirapex.

## 2016-05-25 NOTE — Telephone Encounter (Signed)
FYI

## 2016-05-26 ENCOUNTER — Encounter: Payer: Self-pay | Admitting: Internal Medicine

## 2016-05-26 DIAGNOSIS — S8002XA Contusion of left knee, initial encounter: Secondary | ICD-10-CM

## 2016-05-26 MED ORDER — DICLOFENAC SODIUM 75 MG PO TBEC: 75.0000 mg | DELAYED_RELEASE_TABLET | Freq: Two times a day (BID) | ORAL | 1 refills | Status: DC

## 2016-06-18 LAB — HM PAP SMEAR: HM Pap smear: NORMAL

## 2016-06-22 ENCOUNTER — Ambulatory Visit (INDEPENDENT_AMBULATORY_CARE_PROVIDER_SITE_OTHER): Payer: BC Managed Care – PPO | Admitting: Internal Medicine

## 2016-06-22 ENCOUNTER — Encounter: Payer: Self-pay | Admitting: Internal Medicine

## 2016-06-22 DIAGNOSIS — F411 Generalized anxiety disorder: Secondary | ICD-10-CM

## 2016-06-22 MED ORDER — LIRAGLUTIDE -WEIGHT MANAGEMENT 18 MG/3ML ~~LOC~~ SOPN: 0.6000 mg | PEN_INJECTOR | Freq: Every day | SUBCUTANEOUS | 0 refills | Status: DC

## 2016-06-22 MED ORDER — DIAZEPAM 10 MG PO TABS: 10.0000 mg | ORAL_TABLET | Freq: Every day | ORAL | 3 refills | Status: DC | PRN

## 2016-06-22 MED ORDER — CLONAZEPAM 1 MG PO TABS: 1.0000 mg | ORAL_TABLET | Freq: Every day | ORAL | 3 refills | Status: DC

## 2016-06-22 NOTE — Progress Notes (Signed)
Subjective:  Patient ID: Selena [name], female    DOB: [birth year]  Age: 50 y.o. MRN: [id number]  CC: Diagnoses of Generalized anxiety disorder and Morbid obesity due to excess calories [facility]) were pertinent to this visit.  HPI [name] presents for follow up on  GAD with bruxism and jaw clenching. Last seen one month ago.  Valium 10 mg bid prescribed as substitute for clonazepam.   And thinks she may only need 5 mg .  She is only taking it once during the day for the clenching of teeth.  Mirapex  Was started for restless legs and resumed  the clenching at night.  .  prozac  Was increased to 40 mg daily   Waking up with headaches after 2 nights of regimen:  Diclofenac added ,  topamax 100 mg   Not sleeping without  the clonazepam at night .    Headaches disappeared with a short trial of diclofenac and have not returned   So she has stopped the diclofe ac   MAMMOGRAM  DONE,  PAP NORMAL  Done by dr Alfred Levins AT [facility] .    Obesity:  Indulges in overeating and junk food 4 times per week.  Does ok during the day, but blows it at night Drinks 4-5 Diet Cokes daily. Food choices are limited by IBS and post prandiall cramping and diarrhea.  Wants to lose weight .  has tried every form of nutritional therapy  Hungry all the time. Marland Kitchen Phentermine caused increased agitation.    Discussed trial of Saxenda.     Outpatient Medications Prior to Visit  Medication Sig Dispense Refill  . cetirizine (ZYRTEC) 10 MG tablet Take 10 mg by mouth daily.      . diclofenac (VOLTAREN) 75 MG EC tablet Take 1 tablet (75 mg total) by mouth 2 (two) times daily. 60 tablet 1  . FLUoxetine (PROZAC) 20 MG tablet Take 2 tablets (40 mg total) by mouth daily. 60 tablet 3  . ibuprofen (ADVIL,MOTRIN) 800 MG tablet Take 1 tablet (800 mg total) by mouth every 8 (eight) hours as needed. 30 tablet 0  . ketorolac (TORADOL) 10 MG tablet Take 1 tablet (10 mg total) by mouth every 6 (six) hours as needed. 20 tablet 0    . levonorgestrel (MIRENA) 20 MCG/24HR IUD 1 Intra Uterine Device (1 each total) by Intrauterine route once. 1 each 0  . pramipexole (MIRAPEX) 0.125 MG tablet 1 tablet daily at bedtime,  Increase weekly as needed 90 tablet 3  . promethazine (PHENERGAN) 12.5 MG tablet Take 1 tablet (12.5 mg total) by mouth every 8 (eight) hours as needed for nausea or vomiting. 20 tablet 0  . rizatriptan (MAXALT) 10 MG tablet TAKE 1 TABLET (10 MG TOTAL) BY MOUTH AS NEEDED FOR MIGRAINE. MAY REPEAT IN 2 HOURS IF NEEDED 10 tablet 5  . topiramate (TOPAMAX) 50 MG tablet TAKE 2 TABLETS (100 MG TOTAL) BY MOUTH DAILY AT BEDTIME FOR MIGRAINE PREVENTION 60 tablet 3  . diazepam (VALIUM) 10 MG tablet Take 1 tablet (10 mg total) by mouth every 12 (twelve) hours as needed for anxiety. As needed for anxiety and muscle spasm 60 tablet 3   No facility-administered medications prior to visit.     Review of Systems;  Patient denies headache, fevers, malaise, unintentional weight loss, skin rash, eye pain, sinus congestion and sinus pain, sore throat, dysphagia,  hemoptysis , cough, dyspnea, wheezing, chest pain, palpitations, orthopnea, edema, abdominal pain, nausea, melena, diarrhea, constipation, flank  pain, dysuria, hematuria, urinary  Frequency, nocturia, numbness, tingling, seizures,  Focal weakness, Loss of consciousness,  Tremor, insomnia, depression, anxiety, and suicidal ideation.      Objective:  BP 100/70   Pulse 73   Temp 98 F (36.7 C) (Oral)   Ht 5\' 3"  (1.6 m)   Wt 227 lb (103 kg)   SpO2 95%   BMI 40.21 kg/m   BP Readings from Last 3 Encounters:  06/22/16 100/70  05/20/16 128/90  04/20/16 (!) 150/96    Wt Readings from Last 3 Encounters:  06/22/16 227 lb (103 kg)  05/20/16 225 lb 8 oz (102.3 kg)  04/20/16 223 lb 4 oz (101.3 kg)    General appearance: alert, cooperative and appears stated age Ears: normal TM's and external ear canals both ears Throat: lips, mucosa, and tongue normal; teeth and gums  normal Neck: no adenopathy, no carotid bruit, supple, symmetrical, trachea midline and thyroid not enlarged, symmetric, no tenderness/mass/nodules Back: symmetric, no curvature. ROM normal. No CVA tenderness. Lungs: clear to auscultation bilaterally Heart: regular rate and rhythm, S1, S2 normal, no murmur, click, rub or gallop Abdomen: soft, non-tender; bowel sounds normal; no masses,  no organomegaly Pulses: 2+ and symmetric Skin: Skin color, texture, turgor normal. No rashes or lesions Lymph nodes: Cervical, supraclavicular, and axillary nodes normal.  Lab Results  Component Value Date   HGBA1C 5.5 05/21/2016   HGBA1C 5.8 04/16/2014   HGBA1C 5.7 07/24/2013    Lab Results  Component Value Date   CREATININE 0.89 05/21/2016   CREATININE 0.88 10/29/2015   CREATININE 0.78 04/26/2015    Lab Results  Component Value Date   WBC 9.2 10/29/2015   HGB 13.2 10/29/2015   HCT 39.2 10/29/2015   PLT 338.0 10/29/2015   GLUCOSE 96 05/21/2016   CHOL 229 (H) 04/26/2015   TRIG 166 (H) 05/21/2016   HDL 42 (L) 05/21/2016   LDLDIRECT 171 (H) 05/21/2016   LDLCALC 154 (H) 04/16/2014   ALT 17 05/21/2016   AST 14 05/21/2016   NA 138 05/21/2016   K 3.6 05/21/2016   CL 109 05/21/2016   CREATININE 0.89 05/21/2016   BUN 13 05/21/2016   CO2 22 05/21/2016   TSH 0.99 10/29/2015   INR 1.0 07/13/2008   HGBA1C 5.5 05/21/2016   MICROALBUR 0.50 06/17/2012    Mr Cervical Spine Wo Contrast  Result Date: 08/23/2015 CLINICAL DATA:  50 year old female with right greater than left cervical neck pain, right face numbness radiating to the right arm. Symptoms for 6 weeks with no known injury. Initial encounter. EXAM: MRI CERVICAL SPINE WITHOUT CONTRAST TECHNIQUE: Multiplanar, multisequence MR imaging of the cervical spine was performed. No intravenous contrast was administered. COMPARISON:  [place] cervical spine radiographs 07/27/2013. [facility] Brain MRI 01/12/2011. FINDINGS: Cervical  vertebral height and alignment stable from the 2014 radiographs with mild reversal of lordosis, and trace anterolisthesis at both C2-C3 and C3-C4. There is associated upper cervical facet degeneration greater on the right (see below). No marrow edema or evidence of acute osseous abnormality. Cervicomedullary junction is within normal limits. No spinal cord signal abnormality identified. Negative paraspinal soft tissues. C2-C3: Mild to moderate facet hypertrophy greater on the right. Minimal disc bulge. Mild uncovertebral hypertrophy. No spinal stenosis. Mild right C3 foraminal stenosis. C3-C4: Moderate to severe facet hypertrophy on the right, mild on the left. Fluid in the right facet joint which is capacious (series 8, image 25 and series 9, image 2). No surrounding inflammation. Mild uncovertebral hypertrophy. Mild disc  bulge. Borderline to mild spinal stenosis. No left foraminal stenosis. Moderate right C4 foraminal stenosis. C4-C5: Trace anterolisthesis at this level also. Moderate facet hypertrophy greater on the right. Minimal disc bulge. No spinal stenosis. Mild if any right side C5 foraminal stenosis. C5-C6:  Mild disc bulge.  No stenosis. C6-C7: Mild to moderate facet hypertrophy greater on the left. Mild left C7 foraminal stenosis. C7-T1: Moderate bilateral facet hypertrophy. Mild bilateral C8 foraminal stenosis. No upper thoracic spinal stenosis. IMPRESSION: 1. Dominant finding in the cervical spine is facet arthropathy, maximal on the right at C3-C4 where facet joint fluid is noted but no other active inflammation. This facet disease occurs in the setting of chronic multilevel mild spondylolisthesis. 2. Associated moderate right C4 foraminal stenosis and up to mild spinal stenosis at C3-C4. Intermittent mild cervical foraminal stenosis elsewhere. Electronically Signed   By: Genevie Ann M.D.   On: 08/23/2015 13:55    Assessment & Plan:   Problem List Items Addressed This Visit    Obesity, morbid ([place])      She has had difficulty losing weight due to increased appetite and has a history of intolerance to   Phentermine.  Discussed trial fo Saxenda.        Relevant Medications   Liraglutide -Weight Management (SAXENDA) 18 MG/3ML SOPN   Generalized anxiety disorder    Symptoms are improved with addition of valium during the day , clonazepam at night       Other Visit Diagnoses   None.    A total of 25 minutes of face to face time was spent with patient more than half of which was spent in counselling about the above mentioned conditions  and coordination of care  I have changed Ms. Corpuz's diazepam. I am also having her start on Liraglutide -Weight Management. Additionally, I am having her maintain her cetirizine, levonorgestrel, ibuprofen, topiramate, rizatriptan, ketorolac, promethazine, pramipexole, FLUoxetine, diclofenac, and clonazePAM.  Meds ordered this encounter  Medications  . clonazePAM (KLONOPIN) 1 MG tablet    Sig: Take 1 tablet (1 mg total) by mouth at bedtime.    Dispense:  30 tablet    Refill:  3  . diazepam (VALIUM) 10 MG tablet    Sig: Take 1 tablet (10 mg total) by mouth daily as needed for anxiety. And muscle spasm    Dispense:  30 tablet    Refill:  3    PHARMACY THIS REPLACES PRIOR RX FOR CLONAZEPAM  . Liraglutide -Weight Management (SAXENDA) 18 MG/3ML SOPN    Sig: Inject 0.6 mg into the skin daily. Increase dose weekly as follows: Week 2: 1.2 mg daily ; Week 3: 1.8 mg daily; Week 4: 2.4 mg daily    Dispense:  9 mL    Refill:  0    Medications Discontinued During This Encounter  Medication Reason  . diazepam (VALIUM) 10 MG tablet Reorder    Follow-up: No Follow-up on file.   [name], MD

## 2016-06-22 NOTE — Patient Instructions (Addendum)
Your regimen:  Valium 5 mg in the morning,  May repeat if needed. (maxiumum 10 mg) Mirapex and clonazepam 1 mg  in the evening before bedtime    We discussed a medication called  Saxenda to help  You  lose weight;  It is a daily injection and  the dose is increased weekly as tolerated   If we can get it approved you can bring the medication back and have our Nurse show you how to self administer it.

## 2016-06-24 NOTE — Assessment & Plan Note (Signed)
Symptoms are improved with addition of valium during the day , clonazepam at night

## 2016-06-24 NOTE — Assessment & Plan Note (Signed)
She has had difficulty losing weight due to increased appetite and has a history of intolerance to   Phentermine.  Discussed trial fo Saxenda.

## 2016-06-26 ENCOUNTER — Telehealth: Payer: Self-pay

## 2016-06-26 NOTE — Telephone Encounter (Signed)
Resent PA for Saxenda to pharmacy.  Awaiting approval/Denial .

## 2016-06-27 ENCOUNTER — Other Ambulatory Visit: Payer: Self-pay | Admitting: Internal Medicine

## 2016-06-29 NOTE — Telephone Encounter (Signed)
PA approved from 06/26/2016-10/24/2016.

## 2016-06-30 ENCOUNTER — Other Ambulatory Visit: Payer: Self-pay | Admitting: Internal Medicine

## 2016-06-30 ENCOUNTER — Telehealth: Payer: Self-pay | Admitting: Internal Medicine

## 2016-06-30 MED ORDER — INSULIN PEN NEEDLE 31G X 5 MM MISC: 11 refills | Status: DC

## 2016-06-30 NOTE — Telephone Encounter (Signed)
Pt called and stated that she has received Liraglutide -Weight Management ([place]) 18 MG/3ML SOPN and wanted to know if she needed to make an appointment to be shown how do do the injection herself or can she be stepped through it. Please advise. Thank you!  Call pt @ [phone]

## 2016-06-30 NOTE — Telephone Encounter (Signed)
Nurse visit scheduled for 330 on 07/07/16

## 2016-06-30 NOTE — Telephone Encounter (Signed)
Nurse visit is fine with Selena [name]

## 2016-06-30 NOTE — Telephone Encounter (Signed)
Would you like for her to have a nurse visit or OV?

## 2016-06-30 NOTE — Telephone Encounter (Signed)
Pt stated no needles were not called in.

## 2016-07-01 ENCOUNTER — Ambulatory Visit: Payer: BC Managed Care – PPO | Admitting: Internal Medicine

## 2016-07-02 ENCOUNTER — Ambulatory Visit (INDEPENDENT_AMBULATORY_CARE_PROVIDER_SITE_OTHER): Payer: BC Managed Care – PPO

## 2016-07-02 DIAGNOSIS — Z719 Counseling, unspecified: Secondary | ICD-10-CM

## 2016-07-02 NOTE — Progress Notes (Signed)
/  Per notes, PCP requested that patient come in for teaching on Saxenda with staff  06/30/2016 telephone note.  Patient came in todad to have a tutorial on saxenda medication. Patient was taught how to administer medication and dispose of needles. Patient asked questions and demonstrated appropriate skills.

## 2016-07-07 ENCOUNTER — Ambulatory Visit: Payer: BC Managed Care – PPO

## 2016-07-08 ENCOUNTER — Telehealth: Payer: Self-pay | Admitting: Internal Medicine

## 2016-07-08 ENCOUNTER — Encounter: Payer: Self-pay | Admitting: Internal Medicine

## 2016-07-08 NOTE — Progress Notes (Signed)
  I have reviewed the above information and agree with above.   Ardice Boyan, MD 

## 2016-07-15 ENCOUNTER — Encounter: Payer: Self-pay | Admitting: Internal Medicine

## 2016-07-23 ENCOUNTER — Encounter: Payer: Self-pay | Admitting: Internal Medicine

## 2016-08-03 ENCOUNTER — Encounter: Payer: Self-pay | Admitting: Internal Medicine

## 2016-08-05 ENCOUNTER — Other Ambulatory Visit: Payer: Self-pay | Admitting: Internal Medicine

## 2016-08-05 ENCOUNTER — Encounter: Payer: Self-pay | Admitting: Internal Medicine

## 2016-08-05 MED ORDER — OLOPATADINE HCL 0.1 % OP SOLN: 1.0000 [drp] | Freq: Two times a day (BID) | OPHTHALMIC | 12 refills | Status: DC

## 2016-08-06 ENCOUNTER — Telehealth: Payer: BC Managed Care – PPO | Admitting: Physician Assistant

## 2016-08-06 DIAGNOSIS — J019 Acute sinusitis, unspecified: Secondary | ICD-10-CM

## 2016-08-06 DIAGNOSIS — B9689 Other specified bacterial agents as the cause of diseases classified elsewhere: Secondary | ICD-10-CM

## 2016-08-06 MED ORDER — AMOXICILLIN-POT CLAVULANATE 875-125 MG PO TABS: 1.0000 | ORAL_TABLET | Freq: Two times a day (BID) | ORAL | 0 refills | Status: DC

## 2016-08-06 NOTE — Progress Notes (Signed)

## 2016-08-08 ENCOUNTER — Telehealth: Payer: Self-pay | Admitting: *Deleted

## 2016-08-08 MED ORDER — AZITHROMYCIN 250 MG PO TABS: ORAL_TABLET | ORAL | 0 refills | Status: DC

## 2016-08-08 NOTE — Telephone Encounter (Signed)
Change to zithromax, rx sent, but hold off on taking zithromax for at least 1 day to allow some improvement in diarrhea.  Take imodium (OTC) 1-2 times a day as needed.  Drink plenty of fluids.  If worse abd pain, then needs to be checked.  Thanks.

## 2016-08-08 NOTE — Telephone Encounter (Signed)
Pt notified of Dr. Duncan's comments and instructions and verbalized understanding 

## 2016-08-08 NOTE — Telephone Encounter (Signed)
Pt called Triage. She had a e-visit for sinus inf and was given augmentin. Pt said since taking her 2nd dose of the abx she has had severe diarrhea and bloating and a little abd pain. Pt has been taking med with food and nothing is helping she had to stay home today because the diarrhea is bad. Pt is requesting a new abx sent to [facility] on file. She said if she needs to be seen she will but she is asking that we call in a new abx for her. Pt said she has taken a z-pak and regular amoxicillin with no issues in the past

## 2016-08-10 ENCOUNTER — Encounter: Payer: Self-pay | Admitting: Internal Medicine

## 2016-08-10 ENCOUNTER — Telehealth: Payer: Self-pay | Admitting: Internal Medicine

## 2016-08-10 NOTE — Telephone Encounter (Signed)
Patient really wanted to know if she should take the saxenda while having the stomach issues.  Patient  Also sent My chart message.

## 2016-08-10 NOTE — Telephone Encounter (Signed)
Pt called and stated that she had an e visit over and weekend. Stated that they gave her amoxi-clav because she was diarrhea they changed the medication to Z Pac. Pt is c/o of a headache over her right eye, it feels like someone is stabbing her, diarrhea has stopped but still having stomach pains. Please advise, thank you!  Call pt @ [phone]

## 2016-08-11 NOTE — Telephone Encounter (Signed)
Responded to patient via [place].

## 2016-08-13 ENCOUNTER — Encounter: Payer: Self-pay | Admitting: Internal Medicine

## 2016-08-18 ENCOUNTER — Ambulatory Visit (INDEPENDENT_AMBULATORY_CARE_PROVIDER_SITE_OTHER): Payer: BC Managed Care – PPO | Admitting: Family

## 2016-08-18 ENCOUNTER — Encounter: Payer: Self-pay | Admitting: Family

## 2016-08-18 VITALS — BP 128/80 | HR 85 | Temp 98.1°F | Ht 63.0 in | Wt 218.2 lb

## 2016-08-18 DIAGNOSIS — R0982 Postnasal drip: Secondary | ICD-10-CM

## 2016-08-18 DIAGNOSIS — R110 Nausea: Secondary | ICD-10-CM

## 2016-08-18 MED ORDER — ONDANSETRON 4 MG PO TBDP: 4.0000 mg | ORAL_TABLET | Freq: Three times a day (TID) | ORAL | 0 refills | Status: DC | PRN

## 2016-08-18 MED ORDER — FLUTICASONE PROPIONATE 50 MCG/ACT NA SUSP: 2.0000 | Freq: Every day | NASAL | 2 refills | Status: DC

## 2016-08-18 NOTE — Progress Notes (Signed)
Pre visit review using our clinic review tool, if applicable. No additional management support is needed unless otherwise documented below in the visit note. 

## 2016-08-18 NOTE — Progress Notes (Signed)
Subjective:    Patient ID: Selena [name], female    DOB: [birth year], 50 y.o.   MRN: [id number]  CC: [name] is a 50 y.o. female who presents today for an acute visit.    HPI: Patient here for acute visit with chief complaint of diarrhea 2x per day and vomiting 1-2 x for 3 weeks, resolved. Last emesis 3 days ago.Non bloody stool and vomiting.  Primarily here for Feeling nauseated all day long and having lower abdominal 'achey, sore'.Abdominal pain constant for one week, unchanged. Pain is not severe per patient. No triggers for pain.  Eating bland diet including crackers and drinking water.  No h/o diverticulitis. Had BM this morning and had hard stool, almost constipation.   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 Also complains of nasal congestion and right ear pain for 2 weeks, waxing and waning. Had e-visit and was treated on 11/2 and treated with augmentin, which caused diarrhea. Then changed to azithromycin from PCP which helped with congestion. Still has post nasal drip and right ear pain,pressire.    H/o fatty liver. She also noted esophageal stricture and hasn't had it stretched in a few years.'    She is a history of cholecystectomy. Has irritable bowel disease- fluctuates between diarrhea and constipation, which improved on Saxenda. She stopped the saxenda while she has been sick and hasn't noticed difference.  2014 colonoscopy. Every 5 years due to polyps.         HISTORY:  Past Medical History:  Diagnosis Date  . Anemia, iron deficiency   . Anxiety   . Asthma   . Colon polyps 2014  . Depression   . Eating disorder    Binge eating  . Fibromyalgia   . Gallstones   . Hyperlipidemia   . Hypertension    Past Surgical History:  Procedure Laterality Date  . CHOLECYSTECTOMY    . 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 GANGLION CYST EXCISION    . TONSILLECTOMY  1989   Family History  Problem Relation Age of Onset  .  Hyperlipidemia Mother   . Heart disease Mother     Atrial fibrilation  . Cancer Mother     ? Melanoma  . Diabetes Father   . Mental illness Father     Bipolar  . Cancer Sister     Clear cell sarcoma in leg  . Hypothyroidism Sister   . Hypertension Maternal Grandmother   . Heart disease Maternal Grandmother     Afib  . Arthritis Paternal Grandmother   . Colon cancer Neg Hx   . Rectal cancer Neg Hx     Allergies: Augmentin [amoxicillin-pot clavulanate]; Eggs or egg-derived products; and Sulfonamide derivatives Current Outpatient Prescriptions on File Prior to Visit  Medication Sig Dispense Refill  . azithromycin (ZITHROMAX) 250 MG tablet 2 tabs a day for 1 day and then 1 a day for 4 days. 6 each 0  . cetirizine (ZYRTEC) 10 MG tablet Take 10 mg by mouth daily.      . clonazePAM (KLONOPIN) 1 MG tablet Take 1 tablet (1 mg total) by mouth at bedtime. 30 tablet 3  . diazepam (VALIUM) 10 MG tablet Take 1 tablet (10 mg total) by mouth daily as needed for anxiety. And muscle spasm 30 tablet 3  . diclofenac (VOLTAREN) 75 MG EC tablet Take 1 tablet (75 mg total) by mouth 2 (two) times daily. 60 tablet 1  . FLUoxetine (PROZAC)  20 MG tablet Take 2 tablets (40 mg total) by mouth daily. 60 tablet 3  . ibuprofen (ADVIL,MOTRIN) 800 MG tablet Take 1 tablet (800 mg total) by mouth every 8 (eight) hours as needed. 30 tablet 0  . Insulin Pen Needle 31G X 5 MM MISC For daily use with Saxenda 30 each 11  . ketorolac (TORADOL) 10 MG tablet Take 1 tablet (10 mg total) by mouth every 6 (six) hours as needed. 20 tablet 0  . levonorgestrel (MIRENA) 20 MCG/24HR IUD 1 Intra Uterine Device (1 each total) by Intrauterine route once. 1 each 0  . Liraglutide -Weight Management (SAXENDA) 18 MG/3ML SOPN Inject 0.6 mg into the skin daily. Increase dose weekly as follows: Week 2: 1.2 mg daily ; Week 3: 1.8 mg daily; Week 4: 2.4 mg daily 9 mL 0  . olopatadine (PATANOL) 0.1 % ophthalmic solution Place 1 drop into both eyes  2 (two) times daily. 5 mL 12  . pramipexole (MIRAPEX) 0.125 MG tablet 1 tablet daily at bedtime,  Increase weekly as needed 90 tablet 3  . promethazine (PHENERGAN) 12.5 MG tablet Take 1 tablet (12.5 mg total) by mouth every 8 (eight) hours as needed for nausea or vomiting. 20 tablet 0  . rizatriptan (MAXALT) 10 MG tablet TAKE 1 TABLET (10 MG TOTAL) BY MOUTH AS NEEDED FOR MIGRAINE. MAY REPEAT IN 2 HOURS IF NEEDED 10 tablet 5  . topiramate (TOPAMAX) 50 MG tablet TAKE 2 TABLETS (100 MG TOTAL) BY MOUTH DAILY AT BEDTIME FOR MIGRAINE PREVENTION 60 tablet 3  . [DISCONTINUED] omeprazole (PRILOSEC OTC) 20 MG tablet Take 1 tablet (20 mg total) by mouth daily. 30 tablet 11   No current facility-administered medications on file prior to visit.     Social History  Substance Use Topics  . Smoking status: Never Smoker  . Smokeless tobacco: Never Used  . Alcohol use Yes     Comment: 4 Times a month    Review of Systems  Constitutional: Negative for chills and fever.  HENT: Positive for congestion.   Respiratory: Negative for cough.   Cardiovascular: Negative for chest pain and palpitations.  Gastrointestinal: Positive for abdominal pain and nausea. Negative for abdominal distention, blood in stool, constipation, diarrhea and vomiting.      Objective:    BP 128/80   Pulse 85   Temp 98.1 F (36.7 C) (Oral)   Ht 5\' 3"  (1.6 m)   Wt 218 lb 3.2 oz (99 kg)   SpO2 96%   BMI 38.65 kg/m    Physical Exam  Constitutional: She appears well-developed and well-nourished.  Eyes: Conjunctivae are normal.  Cardiovascular: Normal rate, regular rhythm, normal heart sounds and normal pulses.   Pulmonary/Chest: Effort normal and breath sounds normal. She has no wheezes. She has no rhonchi. She has no rales.  Abdominal: Soft. Normal appearance and bowel sounds are normal. She exhibits no distension, no fluid wave, no ascites and no mass. There is tenderness in the right lower quadrant and left lower quadrant.  There is no rigidity, no rebound, no guarding, no CVA tenderness and no tenderness at McBurney's point.  Generalized mild tenderness lower abdominal.  Neurological: She is alert.  Skin: Skin is warm and dry.  Psychiatric: She has a normal mood and affect. Her speech is normal and behavior is normal. Thought content normal.  Vitals reviewed.      Assessment & Plan:   1. Nausea without vomiting Patient is well-appearing; she is not septic. She is afebrile.  She notes mild, soreness of lower abdomen on exam which, as we discussed, I would expect after weeks of diarrhea. No exquisite tenderness or focal pain appreciated. Patient and I jointly decided to have lab work done today. She politely declined imaging today and we'll call and let me know if pain persists or worsens in anyway. No history of diverticulitis. H/o IBS and esophageal stricture which may be exacerbating nausea. Referral placed to GI for further evaluation of stricture.  - CBC with Differential/Platelet - Comprehensive metabolic panel - Lipase - ondansetron (ZOFRAN ODT) 4 MG disintegrating tablet; Take 1 tablet (4 mg total) by mouth every 8 (eight) hours as needed for nausea or vomiting.  Dispense: 20 tablet; Refill: 0 - Ambulatory referral to Gastroenterology - Stool culture; Future - C. difficile GDH and Toxin A/B; Future  2. Post-nasal drip Suspect allergic, viral etiology. Trial of Flonase. - fluticasone (FLONASE) 50 MCG/ACT nasal spray; Place 2 sprays into both nostrils daily.  Dispense: 16 g; Refill: 2    I am having Ms. Ritchey start on ondansetron and fluticasone. I am also having her maintain her cetirizine, levonorgestrel, ibuprofen, rizatriptan, ketorolac, promethazine, pramipexole, FLUoxetine, diclofenac, clonazePAM, diazepam, Liraglutide -Weight Management, topiramate, Insulin Pen Needle, olopatadine, and azithromycin.   Meds ordered this encounter  Medications  . ondansetron (ZOFRAN ODT) 4 MG disintegrating  tablet    Sig: Take 1 tablet (4 mg total) by mouth every 8 (eight) hours as needed for nausea or vomiting.    Dispense:  20 tablet    Refill:  0    Order Specific Question:   Supervising Provider    Answer:   Deborra Medina L [[id number]]  . fluticasone (FLONASE) 50 MCG/ACT nasal spray    Sig: Place 2 sprays into both nostrils daily.    Dispense:  16 g    Refill:  2    Order Specific Question:   Supervising Provider    Answer:   [name] [[id number]]    Return precautions given.   Risks, benefits, and alternatives of the medications and treatment plan prescribed today were discussed, and patient expressed understanding.   Education regarding symptom management and diagnosis given to patient on AVS.  Continue to follow with TULLO, Aris Everts, MD for routine health maintenance.   [name] and I agreed with plan.   Mable Paris, FNP

## 2016-08-18 NOTE — Patient Instructions (Addendum)
As discussed, we will do labs. Suspect viral enteritis. Continue probiotics.   Continue probiotics.  Follow up with GI for stricture.    If any abnormal or you have worsening pain or new symptoms, please call me ASAP to discuss further evaluation.

## 2016-08-19 ENCOUNTER — Other Ambulatory Visit: Payer: BC Managed Care – PPO

## 2016-08-19 ENCOUNTER — Other Ambulatory Visit: Payer: Self-pay | Admitting: Family

## 2016-08-19 DIAGNOSIS — E876 Hypokalemia: Secondary | ICD-10-CM

## 2016-08-19 DIAGNOSIS — R110 Nausea: Secondary | ICD-10-CM

## 2016-08-19 LAB — CBC WITH DIFFERENTIAL/PLATELET
Basophils Absolute: 0.1 10*3/uL (ref 0.0–0.1)
Basophils Relative: 1.5 % (ref 0.0–3.0)
Eosinophils Absolute: 0.1 10*3/uL (ref 0.0–0.7)
Eosinophils Relative: 1.1 % (ref 0.0–5.0)
HCT: 40.8 % (ref 36.0–46.0)
Hemoglobin: 13.8 g/dL (ref 12.0–15.0)
Lymphocytes Relative: 32.6 % (ref 12.0–46.0)
Lymphs Abs: 3.2 10*3/uL (ref 0.7–4.0)
MCHC: 33.8 g/dL (ref 30.0–36.0)
MCV: 92.6 fl (ref 78.0–100.0)
Monocytes Absolute: 0.6 10*3/uL (ref 0.1–1.0)
Monocytes Relative: 6.1 % (ref 3.0–12.0)
Neutro Abs: 5.8 10*3/uL (ref 1.4–7.7)
Neutrophils Relative %: 58.7 % (ref 43.0–77.0)
Platelets: 324 10*3/uL (ref 150.0–400.0)
RBC: 4.41 Mil/uL (ref 3.87–5.11)
RDW: 13 % (ref 11.5–15.5)
WBC: 9.9 10*3/uL (ref 4.0–10.5)

## 2016-08-19 LAB — COMPREHENSIVE METABOLIC PANEL
ALT: 37 U/L — ABNORMAL HIGH (ref 0–35)
AST: 22 U/L (ref 0–37)
Albumin: 4.2 g/dL (ref 3.5–5.2)
Alkaline Phosphatase: 50 U/L (ref 39–117)
BUN: 15 mg/dL (ref 6–23)
CO2: 24 mEq/L (ref 19–32)
Calcium: 9.3 mg/dL (ref 8.4–10.5)
Chloride: 108 mEq/L (ref 96–112)
Creatinine, Ser: 0.82 mg/dL (ref 0.40–1.20)
GFR: 78.97 mL/min (ref >60.00)
Glucose, Bld: 83 mg/dL (ref 70–99)
Potassium: 3.4 mEq/L — ABNORMAL LOW (ref 3.5–5.1)
Sodium: 139 mEq/L (ref 135–145)
Total Bilirubin: 0.2 mg/dL (ref 0.2–1.2)
Total Protein: 6.8 g/dL (ref 6.0–8.3)

## 2016-08-19 LAB — LIPASE: Lipase: 40 U/L (ref 11.0–59.0)

## 2016-08-20 ENCOUNTER — Encounter: Payer: Self-pay | Admitting: Family

## 2016-08-20 ENCOUNTER — Other Ambulatory Visit (INDEPENDENT_AMBULATORY_CARE_PROVIDER_SITE_OTHER): Payer: BC Managed Care – PPO

## 2016-08-20 ENCOUNTER — Telehealth: Payer: Self-pay | Admitting: Family

## 2016-08-20 DIAGNOSIS — K589 Irritable bowel syndrome without diarrhea: Secondary | ICD-10-CM

## 2016-08-20 DIAGNOSIS — E876 Hypokalemia: Secondary | ICD-10-CM | POA: Diagnosis not present

## 2016-08-20 LAB — C. DIFFICILE GDH AND TOXIN A/B
C. difficile GDH: NOT DETECTED
C. difficile Toxin A/B: NOT DETECTED

## 2016-08-20 NOTE — Telephone Encounter (Signed)
Referral placed.

## 2016-08-21 ENCOUNTER — Telehealth: Payer: Self-pay | Admitting: Internal Medicine

## 2016-08-21 LAB — COMPREHENSIVE METABOLIC PANEL
ALT: 28 U/L (ref 0–35)
AST: 16 U/L (ref 0–37)
Albumin: 3.9 g/dL (ref 3.5–5.2)
Alkaline Phosphatase: 44 U/L (ref 39–117)
BUN: 12 mg/dL (ref 6–23)
CO2: 23 mEq/L (ref 19–32)
Calcium: 8.8 mg/dL (ref 8.4–10.5)
Chloride: 108 mEq/L (ref 96–112)
Creatinine, Ser: 0.77 mg/dL (ref 0.40–1.20)
GFR: 84.91 mL/min (ref >60.00)
Glucose, Bld: 79 mg/dL (ref 70–99)
Potassium: 3.8 mEq/L (ref 3.5–5.1)
Sodium: 138 mEq/L (ref 135–145)
Total Bilirubin: 0.2 mg/dL (ref 0.2–1.2)
Total Protein: 6.1 g/dL (ref 6.0–8.3)

## 2016-08-21 NOTE — Telephone Encounter (Signed)
Pt called and is requesting lab results. Please advise, thank you!   Call pt @ [phone]

## 2016-08-21 NOTE — Telephone Encounter (Signed)
Patient was informed results have not been interpreted.

## 2016-08-23 LAB — STOOL CULTURE

## 2016-08-26 ENCOUNTER — Other Ambulatory Visit: Payer: Self-pay | Admitting: Internal Medicine

## 2016-08-31 ENCOUNTER — Telehealth: Payer: Self-pay | Admitting: Family

## 2016-08-31 NOTE — Telephone Encounter (Signed)
Patient stated that we can contact her via mychart.

## 2016-08-31 NOTE — Telephone Encounter (Signed)
Call pt-  I would advise to see GI for consult due to h.o IBS to ensure she has best management of syndrome.

## 2016-08-31 NOTE — Telephone Encounter (Signed)
mychart message has been sent

## 2016-08-31 NOTE — Telephone Encounter (Signed)
Call pt-  Clarify that I placed a referral for her to see GI for further evaluation of stricture.  Does she still want to see them?  Sorry for any confusion!

## 2016-08-31 NOTE — Telephone Encounter (Signed)
Spoken to patient. She stated that she has noticed that when she stops saxenda the nauseous feeling is not therre but when she retook it, the feeling came back. Patient has appointment with GI in December. Patient would like to know if she should still see them. "She will do whatever". Please advise.

## 2016-09-09 ENCOUNTER — Ambulatory Visit (INDEPENDENT_AMBULATORY_CARE_PROVIDER_SITE_OTHER): Payer: BC Managed Care – PPO | Admitting: Gastroenterology

## 2016-09-09 ENCOUNTER — Encounter: Payer: Self-pay | Admitting: Gastroenterology

## 2016-09-09 VITALS — BP 98/74 | HR 84 | Ht 61.81 in | Wt 220.0 lb

## 2016-09-09 DIAGNOSIS — K580 Irritable bowel syndrome with diarrhea: Secondary | ICD-10-CM

## 2016-09-09 DIAGNOSIS — R110 Nausea: Secondary | ICD-10-CM

## 2016-09-09 NOTE — Patient Instructions (Signed)
If you are age 48 or older, your body mass index should be between 23-30. Your Body mass index is 40.48 kg/m. If this is out of the aforementioned range listed, please consider follow up with your Primary Care Provider.  If you are age 72 or younger, your body mass index should be between 19-25. Your Body mass index is 40.48 kg/m. If this is out of the aformentioned range listed, please consider follow up with your Primary Care Provider.   Thank you for choosing [facility]  Dr Wilfrid Lund III

## 2016-09-09 NOTE — Progress Notes (Signed)
[facility] Consult Note:  History: Selena [name] 09/09/2016  Referring physician: [name], MD  Reason for consult/chief complaint: nausea and vomiting (? if due to weight loss med, better today) and hx of esophageal stricture (dry mouth, cough, feels like something is sitting in throat)   Subjective  HPI:  Selena [name] sees me as a new patient. She was last here in May 2014, which time she saw Dr. Deatra Ina. She had had previous nausea and intermittent right upper quadrant pain, she also has underlying IBS with episodes of crampy lower abdominal pain and urgency and loose stool. There was also dysphagia which really sounds more like a globus sensation. Upper endoscopy at that time showed a reported stricture in 2013, and then a repeat endoscopy with that same dysphagia in 2014 showed no stricture. She is here because a few months ago she was given [place] for weight loss, but it made her feel quite nauseated she had to stop it. She had recurrence of symptoms that she felt were reminiscent of when she last saw Dr. Deatra Ina, her physician suggested that she see [place]. She felt much better soon after stopping that medicine.   ROS:  Review of Systems  Constitutional: Negative for appetite change and unexpected weight change.  HENT: Negative for mouth sores and voice change.   Eyes: Negative for pain and redness.  Respiratory: Negative for cough and shortness of breath.   Cardiovascular: Negative for chest pain and palpitations.  Genitourinary: Negative for dysuria and hematuria.  Musculoskeletal: Negative for arthralgias and myalgias.  Skin: Negative for pallor and rash.  Neurological: Negative for weakness and headaches.  Hematological: Negative for adenopathy.  Psychiatric/Behavioral: The patient is nervous/anxious.      Past Medical History: Past Medical History:  Diagnosis Date  . Anemia, iron deficiency   . Anxiety   . Asthma   . Colon polyps 2014  .  Depression   . Eating disorder    Binge eating  . Fibromyalgia   . Gallstones   . Hyperlipidemia   . Hypertension      Past Surgical History: Past Surgical History:  Procedure Laterality Date  . CHOLECYSTECTOMY    . 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 GANGLION CYST EXCISION    . TONSILLECTOMY  1989     Family History: Family History  Problem Relation Age of Onset  . Hyperlipidemia Mother   . Heart disease Mother     Atrial fibrilation  . Cancer Mother     ? Melanoma  . Diabetes Father   . Mental illness Father     Bipolar  . Cancer Sister     Clear cell sarcoma in leg  . Hypothyroidism Sister   . Hypertension Maternal Grandmother   . Heart disease Maternal Grandmother     Afib  . Arthritis Paternal Grandmother   . Colon cancer Neg Hx   . Rectal cancer Neg Hx     Social History: Social History   Social History  . Marital status: Married    Spouse name: N/A  . Number of children: 1  . Years of education: N/A   Occupational History  . 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 Smoking status: Never Smoker  . Smokeless tobacco: Never Used  . Alcohol use Yes     Comment: 4 Times a month  . Drug use: No  . Sexual activity: Yes    Birth control/ protection: IUD  Other Topics Concern  . None   Social History Narrative   2 Step children; asthma, ADHD    Allergies: Allergies  Allergen Reactions  . Augmentin [Amoxicillin-Pot Clavulanate] Diarrhea    Can take regular amoxicillin with no issues  . Eggs Or Egg-Derived Products   . Sulfonamide Derivatives     REACTION: Itchy    Outpatient Meds: Current Outpatient Prescriptions  Medication Sig Dispense Refill  . azithromycin (ZITHROMAX) 250 MG tablet 2 tabs a day for 1 day and then 1 a day for 4 days. 6 each 0  . cetirizine (ZYRTEC) 10 MG tablet Take 10 mg by mouth daily.      . clonazePAM (KLONOPIN) 1 MG tablet Take 1 tablet (1 mg total) by mouth at  bedtime. 30 tablet 3  . diazepam (VALIUM) 10 MG tablet Take 1 tablet (10 mg total) by mouth daily as needed for anxiety. And muscle spasm 30 tablet 3  . diclofenac (VOLTAREN) 75 MG EC tablet Take 1 tablet (75 mg total) by mouth 2 (two) times daily. 60 tablet 1  . FLUoxetine (PROZAC) 20 MG tablet TAKE 2 TABLETS (40 MG TOTAL) BY MOUTH DAILY. 60 tablet 2  . fluticasone (FLONASE) 50 MCG/ACT nasal spray Place 2 sprays into both nostrils daily. 16 g 2  . ibuprofen (ADVIL,MOTRIN) 800 MG tablet Take 1 tablet (800 mg total) by mouth every 8 (eight) hours as needed. 30 tablet 0  . ketorolac (TORADOL) 10 MG tablet Take 1 tablet (10 mg total) by mouth every 6 (six) hours as needed. 20 tablet 0  . levonorgestrel (MIRENA) 20 MCG/24HR IUD 1 Intra Uterine Device (1 each total) by Intrauterine route once. 1 each 0  . olopatadine (PATANOL) 0.1 % ophthalmic solution Place 1 drop into both eyes 2 (two) times daily. 5 mL 12  . ondansetron (ZOFRAN ODT) 4 MG disintegrating tablet Take 1 tablet (4 mg total) by mouth every 8 (eight) hours as needed for nausea or vomiting. 20 tablet 0  . pramipexole (MIRAPEX) 0.125 MG tablet 1 tablet daily at bedtime,  Increase weekly as needed 90 tablet 3  . promethazine (PHENERGAN) 12.5 MG tablet Take 1 tablet (12.5 mg total) by mouth every 8 (eight) hours as needed for nausea or vomiting. 20 tablet 0  . rizatriptan (MAXALT) 10 MG tablet TAKE 1 TABLET (10 MG TOTAL) BY MOUTH AS NEEDED FOR MIGRAINE. MAY REPEAT IN 2 HOURS IF NEEDED 10 tablet 5  . topiramate (TOPAMAX) 50 MG tablet TAKE 2 TABLETS (100 MG TOTAL) BY MOUTH DAILY AT BEDTIME FOR MIGRAINE PREVENTION 60 tablet 3   No current facility-administered medications for this visit.       ___________________________________________________________________ Objective   Exam:  BP 98/74 (BP Location: Left Arm, Patient Position: Sitting, Cuff Size: Normal)   Pulse 84   Ht 5' 1.81" (1.57 m) Comment: height measured without shoes  Wt 220  lb (99.8 kg)   BMI 40.48 kg/m    General: this is a(n) Overweight and well-appearing woman   Eyes: sclera anicteric, no redness  ENT: oral mucosa moist without lesions, no cervical or supraclavicular lymphadenopathy, good dentition  CV: RRR without murmur, S1/S2, no JVD, no peripheral edema  Resp: clear to auscultation bilaterally, normal RR and effort noted  GI: soft, no tenderness, with active bowel sounds. No guarding or palpable organomegaly noted.  Skin; warm and dry, no rash or jaundice noted  Neuro: awake, alert and oriented x 3. Normal gross motor function and fluent speech  Data: CBC  Latest Ref Rng & Units 08/18/2016 10/29/2015 04/26/2015  WBC 4.0 - 10.5 K/uL 9.9 9.2 7.4  Hemoglobin 12.0 - 15.0 g/dL 13.8 13.2 14.3  Hematocrit 36.0 - 46.0 % 40.8 39.2 42.4  Platelets 150.0 - 400.0 K/uL 324.0 338.0 306.0   CMP Latest Ref Rng & Units 08/20/2016 08/18/2016 05/21/2016  Glucose 70 - 99 mg/dL 79 83 96  BUN 6 - 23 mg/dL 12 15 13   Creatinine 0.40 - 1.20 mg/dL 0.77 0.82 0.89  Sodium 135 - 145 mEq/L 138 139 138  Potassium 3.5 - 5.1 mEq/L 3.8 3.4(L) 3.6  Chloride 96 - 112 mEq/L 108 108 109  CO2 19 - 32 mEq/L 23 24 22   Calcium 8.4 - 10.5 mg/dL 8.8 9.3 8.8  Total Protein 6.0 - 8.3 g/dL 6.1 6.8 6.6  Total Bilirubin 0.2 - 1.2 mg/dL 0.2 0.2 0.3  Alkaline Phos 39 - 117 U/L 44 50 55  AST 0 - 37 U/L 16 22 14   ALT 0 - 35 U/L 28 37(H) 17     Assessment: Encounter Diagnoses  Name Primary?  . Irritable bowel syndrome with diarrhea Yes  . Nausea without vomiting     It sounds like she had side effects of the medication but not anything related to the reported stricture seen in the past. I don't think repeat upper endoscopy is necessary. Early sounds like she has an underlying functional bowel disorder she has had since adolescence and she feels is manageable. Symptoms are unpredictable in brief, thus I don't think antispasmodic therapy will likely be helpful  Plan:  She will be  recalled in May 2019 for a colonoscopy due to a 3 mm tubular adenoma found in 2014. See me sooner as needed  Thank you for the courtesy of this consult.  Please call me with any questions or concerns.  Nelida Meuse III  CC: [name], MD

## 2016-09-24 ENCOUNTER — Other Ambulatory Visit: Payer: Self-pay | Admitting: Family Medicine

## 2016-09-24 ENCOUNTER — Telehealth: Payer: Self-pay | Admitting: *Deleted

## 2016-09-24 MED ORDER — FLUCONAZOLE 150 MG PO TABS
150.0000 mg | ORAL_TABLET | Freq: Once | ORAL | 1 refills | Status: AC
Start: 2016-09-24 — End: 2016-09-24

## 2016-09-24 NOTE — Telephone Encounter (Signed)
Patient has reported to have a yeast infection, she has symptom of itchy and discharge.  Patient requested a Rx for diflucan called into the pharmacy.  Pharmacy [facility]  Pt contact [phone]

## 2016-09-24 NOTE — Telephone Encounter (Signed)
Notified patient. thanks 

## 2016-09-24 NOTE — Telephone Encounter (Signed)
Please advise in the absence of Dr. Derrel Nip, thanks

## 2016-09-24 NOTE — Telephone Encounter (Signed)
Rx sent 

## 2016-09-25 ENCOUNTER — Other Ambulatory Visit: Payer: Self-pay

## 2016-09-30 ENCOUNTER — Encounter: Payer: Self-pay | Admitting: Internal Medicine

## 2016-09-30 ENCOUNTER — Telehealth: Payer: Self-pay | Admitting: Internal Medicine

## 2016-09-30 DIAGNOSIS — H1032 Unspecified acute conjunctivitis, left eye: Secondary | ICD-10-CM

## 2016-09-30 NOTE — Telephone Encounter (Signed)
See documentation in my chart.

## 2016-09-30 NOTE — Telephone Encounter (Signed)
Pt called and stated that she is at the beach and thinks that she has pink eye. She stated that her left eye is swollen, both eyes were stuck together, and tearing. Please advise, thank you!  Call pt @ [phone]

## 2016-09-30 NOTE — Telephone Encounter (Signed)
Patient states she was exposed to pink eye at school by students , states she woke up this am eyelids were matted with green yellow discharge.  Patient has telephone encounter open as well.

## 2016-10-01 ENCOUNTER — Telehealth: Payer: Self-pay | Admitting: Internal Medicine

## 2016-10-01 ENCOUNTER — Telehealth: Payer: BC Managed Care – PPO | Admitting: Physician Assistant

## 2016-10-01 ENCOUNTER — Telehealth: Payer: Self-pay | Admitting: *Deleted

## 2016-10-01 DIAGNOSIS — H1033 Unspecified acute conjunctivitis, bilateral: Secondary | ICD-10-CM

## 2016-10-01 DIAGNOSIS — J309 Allergic rhinitis, unspecified: Secondary | ICD-10-CM

## 2016-10-01 DIAGNOSIS — H1010 Acute atopic conjunctivitis, unspecified eye: Secondary | ICD-10-CM | POA: Insufficient documentation

## 2016-10-01 MED ORDER — CIPROFLOXACIN HCL 0.3 % OP SOLN: 1.0000 [drp] | OPHTHALMIC | 0 refills | Status: DC

## 2016-10-01 MED ORDER — OFLOXACIN 0.3 % OP SOLN: 1.0000 [drp] | Freq: Four times a day (QID) | OPHTHALMIC | 0 refills | Status: DC

## 2016-10-01 NOTE — Assessment & Plan Note (Signed)
Likely viral but patient is at the beach, has multiple sick contacts.

## 2016-10-01 NOTE — Telephone Encounter (Signed)
Rx fax to [facility] [phone]

## 2016-10-01 NOTE — Telephone Encounter (Signed)
Please call pt in reference to her pink eye symptoms, pt has notation in a email dated from 12/27 Call transferred to triage nurse

## 2016-10-01 NOTE — Telephone Encounter (Signed)
[facility] [place] [facility] [employer] phone  number [place]

## 2016-10-01 NOTE — Progress Notes (Signed)
We are sorry that you are not feeling well.  Here is how we plan to help!  Based on what you have shared with me it looks like you have conjunctivitis.  Conjunctivitis is a common inflammatory or infectious condition of the eye that is often referred to as "pink eye".  In most cases it is contagious (viral or bacterial). However, not all conjunctivitis requires antibiotics (ex. Allergic).  We have made appropriate suggestions for you based upon your presentation.  I have prescribed Oflaxacin 1-2 drops 4 times a day times 5 days   Pink eye can be highly contagious.  It is typically spread through direct contact with secretions, or contaminated objects or surfaces that one may have touched.  Strict handwashing is suggested with soap and water is urged.  If not available, use alcohol based had sanitizer.  Avoid unnecessary touching of the eye.  If you wear contact lenses, you will need to refrain from wearing them until you see no white discharge from the eye for at least 24 hours after being on medication.  You should see symptom improvement in 1-2 days after starting the medication regimen.  Call us if symptoms are not improved in 1-2 days.  Home Care:  Wash your hands often!  Do not wear your contacts until you complete your treatment plan.  Avoid sharing towels, bed linen, personal items with a person who has pink eye.  See attention for anyone in your home with similar symptoms.  Get Help Right Away If:  Your symptoms do not improve.  You develop blurred or loss of vision.  Your symptoms worsen (increased discharge, pain or redness)  Your e-visit answers were reviewed by a board certified advanced clinical practitioner to complete your personal care plan.  Depending on the condition, your plan could have included both over the counter or prescription medications.  If there is a problem please reply  once you have received a response from your provider.  Your safety is important to us.   If you have drug allergies check your prescription carefully.    You can use MyChart to ask questions about today's visit, request a non-urgent call back, or ask for a work or school excuse for 24 hours related to this e-Visit. If it has been greater than 24 hours you will need to follow up with your provider, or enter a new e-Visit to address those concerns.   You will get an e-mail in the next two days asking about your experience.  I hope that your e-visit has been valuable and will speed your recovery. Thank you for using e-visits.      

## 2016-10-01 NOTE — Addendum Note (Signed)
Addended by: [name] on: 10/01/2016 02:59 PM   Modules accepted: Orders

## 2016-10-02 ENCOUNTER — Telehealth: Payer: Self-pay | Admitting: Internal Medicine

## 2016-10-02 NOTE — Telephone Encounter (Signed)
Pt called and stated that she was back in town and wanted to know if she needed an appt. She has been using the drop and her eyes were getting better. Please advise, thank you!  Call pt @ [phone]

## 2016-10-02 NOTE — Telephone Encounter (Signed)
Spoke with patient states eye are swollen and red  However it has only has been 24 hours since she started eye drops given through e-visit.     She will call tomorrow for appointment at [place] office if no better.

## 2016-10-03 ENCOUNTER — Encounter: Payer: Self-pay | Admitting: Family Medicine

## 2016-10-03 ENCOUNTER — Ambulatory Visit (INDEPENDENT_AMBULATORY_CARE_PROVIDER_SITE_OTHER): Payer: BC Managed Care – PPO | Admitting: Family Medicine

## 2016-10-03 VITALS — BP 138/90 | HR 70 | Temp 97.9°F | Resp 16 | Wt 219.0 lb

## 2016-10-03 DIAGNOSIS — J019 Acute sinusitis, unspecified: Secondary | ICD-10-CM | POA: Diagnosis not present

## 2016-10-03 DIAGNOSIS — H1033 Unspecified acute conjunctivitis, bilateral: Secondary | ICD-10-CM | POA: Diagnosis not present

## 2016-10-03 MED ORDER — NEOMYCIN-POLYMYXIN-HC 3.5-10000-1 OP SUSP: 4.0000 [drp] | Freq: Four times a day (QID) | OPHTHALMIC | 0 refills | Status: DC

## 2016-10-03 MED ORDER — AZITHROMYCIN 250 MG PO TABS: ORAL_TABLET | ORAL | 0 refills | Status: DC

## 2016-10-03 NOTE — Progress Notes (Signed)
Pre visit review using our clinic review tool, if applicable. No additional management support is needed unless otherwise documented below in the visit note. 

## 2016-10-03 NOTE — Progress Notes (Signed)
   Subjective:    Patient ID: Selena [name], female    DOB: [birth year], 50 y.o.   MRN: [id number]  HPI Here for 5 days of swollen, red eyes which are matted shut with green mucus every morning. She does not wear contacts. She has also developed sinus pressure and PND in the past 2 days. No fever. She has been using Ofloxacin eye drops for 3 days with no relief.    Review of Systems  Constitutional: Negative.   HENT: Positive for congestion, postnasal drip and sinus pressure. Negative for ear pain and sore throat.   Eyes: Positive for pain, discharge and redness. Negative for visual disturbance.  Respiratory: Negative.        Objective:   Physical Exam  Constitutional: She appears well-developed and well-nourished.  HENT:  Right Ear: External ear normal.  Left Ear: External ear normal.  Nose: Nose normal.  Mouth/Throat: Oropharynx is clear and moist. No oropharyngeal exudate.  Eyes: EOM are normal. Pupils are equal, round, and reactive to light.  Both conjunctivae are red, all the eyelids are puffy. Corneas are clear.   Neck: Neck supple. No thyromegaly present.  Pulmonary/Chest: Effort normal and breath sounds normal.  Lymphadenopathy:    She has no cervical adenopathy.          Assessment & Plan:  She has a conjunctivitis and an early sinusitis. Switch from Ofloxacin to Cortisporin eye drops and add a Zpack. Alysia Penna, MD

## 2016-10-09 ENCOUNTER — Encounter: Payer: Self-pay | Admitting: Family Medicine

## 2016-10-09 ENCOUNTER — Telehealth: Payer: Self-pay | Admitting: Internal Medicine

## 2016-10-09 NOTE — Telephone Encounter (Signed)
Patient advised of below and verbalized understanding.   We have no available appointments in the office today.   She will try and contact Dr Barbie Banner office or try and get into Saturday clinic at [facility].

## 2016-10-09 NOTE — Telephone Encounter (Signed)
Seen on 10/03/16 by Dr Sharlene Motts. She is  still having white drainage at corners of eyes and in the  lashes at night .  Eyes are dry in the am and are still red in the  of white of the eye.      Patient was prescribed  Neomycin Polymyxin -hydrocortisone which she is almost out of.   She has also finished Z pak .  Patient also complains of facial pain under eyes and nasal discharge when she blows her nose of green /blood . She has also left message with Dr Barbie Banner office.  Please advise.

## 2016-10-09 NOTE — Telephone Encounter (Signed)
I'm sorry, but  cannot treat without seeing.  She will need to see dr Sarajane Jews or  Go to Urgent Care as I have no appointments today.  If someone else in the office as an appoitment  Offer it to her

## 2016-10-09 NOTE — Telephone Encounter (Signed)
Pt called and stated that she has finished antibiotic that Dr. Sarajane Jews prescribed last Saturday, she is also almost out of eye drops. Her eyes are still seeping and still congested, and face swelling. Please advise, thank you!  Call pt [phone]

## 2016-10-10 ENCOUNTER — Ambulatory Visit (INDEPENDENT_AMBULATORY_CARE_PROVIDER_SITE_OTHER): Payer: BC Managed Care – PPO | Admitting: Family Medicine

## 2016-10-10 ENCOUNTER — Encounter: Payer: Self-pay | Admitting: Family Medicine

## 2016-10-10 VITALS — BP 100/70 | HR 94 | Temp 98.1°F | Resp 12 | Ht 62.0 in | Wt 222.0 lb

## 2016-10-10 DIAGNOSIS — H04123 Dry eye syndrome of bilateral lacrimal glands: Secondary | ICD-10-CM

## 2016-10-10 DIAGNOSIS — J309 Allergic rhinitis, unspecified: Secondary | ICD-10-CM | POA: Diagnosis not present

## 2016-10-10 MED ORDER — AZELASTINE HCL 0.1 % NA SOLN: 2.0000 | Freq: Two times a day (BID) | NASAL | 2 refills | Status: DC

## 2016-10-10 MED ORDER — PREDNISONE 20 MG PO TABS: 40.0000 mg | ORAL_TABLET | Freq: Every day | ORAL | 0 refills | Status: AC

## 2016-10-10 NOTE — Patient Instructions (Signed)
  Ms.Selena [name] I have seen you today for an acute visit.  1. Allergic rhinitis, unspecified chronicity, unspecified seasonality, unspecified trigger  - predniSONE (DELTASONE) 20 MG tablet; Take 2 tablets (40 mg total) by mouth daily with breakfast.  Dispense: 6 tablet; Refill: 0 - azelastine (ASTELIN) 0.1 % nasal spray; Place 2 sprays into both nostrils 2 (two) times daily. Use in each nostril as directed  Dispense: 30 mL; Refill: 2  2. 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  Steam inhalations helps with runny nose, nasal congestion, and may prevent sinus infections. Cough and nasal congestion could last a few days and sometimes weeks. Please follow in not any better in 1-2 weeks or if symptoms get worse.  Prednisone with food, breakfast ideally.   In general please monitor for signs of worsening symptoms and seek immediate medical attention if any concerning/warning symptom as we discussed.   If symptoms are not resolved in a few 2-3 weeks you should schedule a follow up appointment with your doctor, before if needed.

## 2016-10-10 NOTE — Progress Notes (Signed)
HPI:  ACUTE VISIT  Chief Complaint  Patient presents with  . Sinus Problem    x 3 weeks, finished Z-pak  . Nasal Congestion    [name] is a 51 y.o.female here today complaining of about 3 weeks of respiratory symptoms.  Symptoms started with bilateral periocular erythema/edema and conjunctival erythema while she was on the beach. According to pt, Rx for conjunctivitis was called in by her PCP. These symptoms resolved but still feels eyes "funny", denies eye pain.  She was seen 10/03/16 and Dx with sinusitis, treated with Z pak, which she completed.  She is concerned because still feeling "stopped up." "Little" bitemporal headache.   Mild productive cough in the morning, denies hemoptysis. + Nasal congestion, rhinorrhea,and post nasal drainage.  She has not noted chest pain, dyspnea, or wheezing.  No Hx of overseas recent travel. No sick contact. No known insect bite.  Hx of allergies: Yes, she is on Nasonex nasal spray. Hx of asthma, she has not had an exacerbation in a while.  OTC medications for this problem: None.  Hx of dry eye synd, she follows with eye care provider. She denies visual changes or conjunctival erythema. She is on Restasis eye drops.   Symptoms otherwise stable.     Review of Systems  Constitutional: Negative for appetite change, chills, fatigue and fever.  HENT: Positive for congestion, postnasal drip, rhinorrhea and sinus pressure. Negative for ear pain, facial swelling, mouth sores, nosebleeds, sore throat, trouble swallowing and voice change.   Eyes: Positive for itching. Negative for photophobia, pain, discharge, redness and visual disturbance.  Respiratory: Positive for cough. Negative for shortness of breath and wheezing.   Cardiovascular: Negative for chest pain.  Gastrointestinal: Negative for abdominal pain, nausea and vomiting.  Musculoskeletal: Negative for myalgias and neck pain.  Skin: Negative for rash.    Allergic/Immunologic: Positive for environmental allergies.  Neurological: Positive for headaches. Negative for syncope and weakness.  Hematological: Negative for adenopathy. Does not bruise/bleed easily.  Psychiatric/Behavioral: Negative for confusion. The patient is nervous/anxious.       Current Outpatient Prescriptions on File Prior to Visit  Medication Sig Dispense Refill  . cetirizine (ZYRTEC) 10 MG tablet Take 10 mg by mouth daily.      . clonazePAM (KLONOPIN) 1 MG tablet Take 1 tablet (1 mg total) by mouth at bedtime. 30 tablet 3  . diazepam (VALIUM) 10 MG tablet Take 1 tablet (10 mg total) by mouth daily as needed for anxiety. And muscle spasm 30 tablet 3  . diclofenac (VOLTAREN) 75 MG EC tablet Take 1 tablet (75 mg total) by mouth 2 (two) times daily. 60 tablet 1  . FLUoxetine (PROZAC) 20 MG tablet TAKE 2 TABLETS (40 MG TOTAL) BY MOUTH DAILY. 60 tablet 2  . fluticasone (FLONASE) 50 MCG/ACT nasal spray Place 2 sprays into both nostrils daily. 16 g 2  . ibuprofen (ADVIL,MOTRIN) 800 MG tablet Take 1 tablet (800 mg total) by mouth every 8 (eight) hours as needed. 30 tablet 0  . ketorolac (TORADOL) 10 MG tablet Take 1 tablet (10 mg total) by mouth every 6 (six) hours as needed. 20 tablet 0  . levonorgestrel (MIRENA) 20 MCG/24HR IUD 1 Intra Uterine Device (1 each total) by Intrauterine route once. 1 each 0  . pramipexole (MIRAPEX) 0.125 MG tablet 1 tablet daily at bedtime,  Increase weekly as needed 90 tablet 3  . rizatriptan (MAXALT) 10 MG tablet TAKE 1 TABLET (10 MG TOTAL) BY MOUTH  AS NEEDED FOR MIGRAINE. MAY REPEAT IN 2 HOURS IF NEEDED 10 tablet 5  . topiramate (TOPAMAX) 50 MG tablet TAKE 2 TABLETS (100 MG TOTAL) BY MOUTH DAILY AT BEDTIME FOR MIGRAINE PREVENTION 60 tablet 3  . [DISCONTINUED] omeprazole (PRILOSEC OTC) 20 MG tablet Take 1 tablet (20 mg total) by mouth daily. 30 tablet 11   No current facility-administered medications on file prior to visit.      Past Medical  History:  Diagnosis Date  . Anemia, iron deficiency   . Anxiety   . Asthma   . Colon polyps 2014  . Depression   . Eating disorder    Binge eating  . Fibromyalgia   . Gallstones   . Hyperlipidemia   . Hypertension    Allergies  Allergen Reactions  . Augmentin [Amoxicillin-Pot Clavulanate] Diarrhea    Can take regular amoxicillin with no issues  . Eggs Or Egg-Derived Products   . Sulfonamide Derivatives     REACTION: Itchy    Social History   Social History  . Marital status: Married    Spouse name: N/A  . Number of children: 1  . Years of education: N/A   Occupational History  . 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 Smoking status: Never Smoker  . Smokeless tobacco: Never Used  . Alcohol use Yes     Comment: 4 Times a month  . Drug use: No  . Sexual activity: Yes    Birth control/ protection: IUD   Other Topics Concern  . None   Social History Narrative   2 Step children; asthma, ADHD    Vitals:   10/10/16 1053  BP: 100/70  Pulse: 94  Resp: 12  Temp: 98.1 F (36.7 C)   O2 sat 97% at [facility].   Body mass index is 40.6 kg/m.    Physical Exam  Nursing note and vitals reviewed. Constitutional: She is oriented to person, place, and time. She appears well-developed. She does not appear ill. No distress.  HENT:  Head: Atraumatic.  Right Ear: Tympanic membrane, external ear and ear canal normal.  Left Ear: Tympanic membrane, external ear and ear canal normal.  Nose: Septal deviation present. No mucosal edema. Right sinus exhibits no maxillary sinus tenderness and no frontal sinus tenderness. Left sinus exhibits no maxillary sinus tenderness and no frontal sinus tenderness.  Mouth/Throat: Oropharynx is clear and moist and mucous membranes are normal.  Nasal voice. Mild post nasal drainage.   Eyes: Conjunctivae and EOM are normal. Pupils are equal, round, and reactive to light.  Neck: No muscular tenderness present. No  edema and no erythema present.  Cardiovascular: Normal rate and regular rhythm.   No murmur heard. Respiratory: Effort normal and breath sounds normal. No stridor. No respiratory distress.  Lymphadenopathy:       Head (right side): No submandibular adenopathy present.       Head (left side): No submandibular adenopathy present.    She has no cervical adenopathy.  Neurological: She is alert and oriented to person, place, and time. She has normal strength. No cranial nerve deficit. Gait normal.  Skin: Skin is warm. No rash noted. No erythema.  Psychiatric: Her speech is normal. Her mood appears anxious.  Well groomed, good eye contact.      ASSESSMENT AND PLAN:     Selena [name] was seen today for sinus problem and nasal congestion.  Diagnoses and all orders for this visit:  Allergic rhinitis, unspecified chronicity, unspecified  seasonality, unspecified trigger -     predniSONE (DELTASONE) 20 MG tablet; Take 2 tablets (40 mg total) by mouth daily with breakfast. -     azelastine (ASTELIN) 0.1 % nasal spray; Place 2 sprays into both nostrils 2 (two) times daily. 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  I explained Selena [name] that symptomatic treatment is usually recommended in this case, so I do not think more abx is needed at this time. Instructed to monitor for signs of complications. Prednisone side effects discussed. Continue Nasonex nasal spray. Today Astelin nasal spray added.  I also explained that cough and nasal congestion can last a few days and sometimes weeks. F/U as needed.  -In regard to dry eye, instructed to follow up with eye care provider. OTC antihistaminics may aggravate eye dryness,so no recommended today. Clearly instructed about warning signs.       -Selena [name] was advised to return or notify a doctor immediately if symptoms worsen or  persist or new concerns arise, she voices understanding.       Selena [name] G. Martinique, MD  [facility]. [place] office.

## 2016-10-10 NOTE — Progress Notes (Signed)
Pre visit review using our clinic review tool, if applicable. No additional management support is needed unless otherwise documented below in the visit note. 

## 2016-10-11 NOTE — Telephone Encounter (Signed)
She was seen in the Saturday clinic yesterday

## 2016-10-20 ENCOUNTER — Other Ambulatory Visit: Payer: Self-pay

## 2016-10-20 DIAGNOSIS — R0982 Postnasal drip: Secondary | ICD-10-CM

## 2016-10-20 MED ORDER — FLUTICASONE PROPIONATE 50 MCG/ACT NA SUSP: 2.0000 | Freq: Every day | NASAL | 2 refills | Status: DC

## 2016-10-20 NOTE — Telephone Encounter (Signed)
Medication has been refilled.

## 2016-10-28 ENCOUNTER — Other Ambulatory Visit: Payer: Self-pay | Admitting: Internal Medicine

## 2016-10-28 NOTE — Telephone Encounter (Signed)
Refilled 06/22/16 with 3 refills. last seen 06/22/16. Pt does not have a future appt. Please advise?

## 2016-10-28 NOTE — Telephone Encounter (Signed)
Needs ov every 3 months.   Please schedule and warn patient. 30 day refill given

## 2016-10-29 NOTE — Telephone Encounter (Signed)
Patient notified of Rx sent to family. 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

## 2016-11-09 ENCOUNTER — Ambulatory Visit (INDEPENDENT_AMBULATORY_CARE_PROVIDER_SITE_OTHER): Payer: BC Managed Care – PPO | Admitting: Internal Medicine

## 2016-11-09 DIAGNOSIS — H1013 Acute atopic conjunctivitis, bilateral: Secondary | ICD-10-CM

## 2016-11-09 DIAGNOSIS — F411 Generalized anxiety disorder: Secondary | ICD-10-CM | POA: Diagnosis not present

## 2016-11-09 DIAGNOSIS — J309 Allergic rhinitis, unspecified: Secondary | ICD-10-CM

## 2016-11-09 MED ORDER — LIRAGLUTIDE -WEIGHT MANAGEMENT 18 MG/3ML ~~LOC~~ SOPN: 0.6000 mg | PEN_INJECTOR | Freq: Every day | SUBCUTANEOUS | 3 refills | Status: DC

## 2016-11-09 MED ORDER — OSELTAMIVIR PHOSPHATE 75 MG PO CAPS: 75.0000 mg | ORAL_CAPSULE | Freq: Every day | ORAL | 0 refills | Status: DC

## 2016-11-09 MED ORDER — DIAZEPAM 10 MG PO TABS: 10.0000 mg | ORAL_TABLET | Freq: Every day | ORAL | 3 refills | Status: DC | PRN

## 2016-11-09 MED ORDER — CLONAZEPAM 1 MG PO TABS: 1.0000 mg | ORAL_TABLET | Freq: Every day | ORAL | 5 refills | Status: DC

## 2016-11-09 MED ORDER — FLUOXETINE HCL 60 MG PO TABS: 60.0000 mg | ORAL_TABLET | Freq: Every day | ORAL | 0 refills | Status: DC

## 2016-11-09 MED ORDER — DIAZEPAM 10 MG PO TABS
10.0000 mg | ORAL_TABLET | Freq: Every day | ORAL | 3 refills | Status: DC | PRN
Start: 2016-11-09 — End: 2017-05-17

## 2016-11-09 MED ORDER — TOPIRAMATE 100 MG PO TABS: 100.0000 mg | ORAL_TABLET | Freq: Every day | ORAL | 3 refills | Status: DC

## 2016-11-09 NOTE — Progress Notes (Signed)
Pre visit review using our clinic review tool, if applicable. No additional management support is needed unless otherwise documented below in the visit note. 

## 2016-11-09 NOTE — Progress Notes (Signed)
Subjective:  Patient ID: Selena [name], female    DOB: [birth year]  Age: 51 y.o. MRN: [id number]  CC: Diagnoses of Allergic conjunctivitis of both eyes and rhinitis and Generalized anxiety disorder were pertinent to this visit.  HPI [name] presents for follow up on GAD,  Obesity .  Trial of saxenda was started but stopped due to insurance nonpayment. Thinks she was on it for a month ("1 box")  But that ended two months ago.   Stopped it because she got sick,  Then    Weight gain of 7 lbs noted .  Prior enrollment in a [employer] 1-2 YEAR.S  LSOT 15 LBS  SINCE STOPPING THE SAXENDA SHE IS HUNGRY ALL THE TIME   CONTINUES TO HAVE HOARSENESS,  Left eye  Drainage, clear,  Was treated for bacterial conjunctivitiis, with ofloxacin for days  Then At the Saturday Clinic dec 30 changed to cortisporin eye drops,  And a z pack   In Jan 6 ,D added oral steroids and topical antihistamines and nasal steroids.  Then developed fbags under eyes, and went to eye doctor.   HER TEAR DUCTS ARE open,  But both  Are still draining.    Currently using flonase , restasis, (per eye doctor optometrist)   And systane gel for eyes.    Has had allergy testing,  Allergic to dust  Trees  and grass,  Not dogs or cats.   .   New dog added recently Nicole Kindred) chihauah since Oct/Nov   Egg allergy   Outpatient Medications Prior to Visit  Medication Sig Dispense Refill  . cetirizine (ZYRTEC) 10 MG tablet Take 10 mg by mouth daily.      . diclofenac (VOLTAREN) 75 MG EC tablet Take 1 tablet (75 mg total) by mouth 2 (two) times daily. 60 tablet 1  . fluticasone (FLONASE) 50 MCG/ACT nasal spray Place 2 sprays into both nostrils daily. 16 g 2  . ibuprofen (ADVIL,MOTRIN) 800 MG tablet Take 1 tablet (800 mg total) by mouth every 8 (eight) hours as needed. 30 tablet 0  . levonorgestrel (MIRENA) 20 MCG/24HR IUD 1 Intra Uterine Device (1 each total) by Intrauterine route once. 1 each 0  . pramipexole  (MIRAPEX) 0.125 MG tablet 1 tablet daily at bedtime,  Increase weekly as needed 90 tablet 3  . rizatriptan (MAXALT) 10 MG tablet TAKE 1 TABLET (10 MG TOTAL) BY MOUTH AS NEEDED FOR MIGRAINE. MAY REPEAT IN 2 HOURS IF NEEDED 10 tablet 5  . clonazePAM (KLONOPIN) 1 MG tablet TAKE 1 TABLET BY MOUTH AT BEDTIME 30 tablet 0  . diazepam (VALIUM) 10 MG tablet Take 1 tablet (10 mg total) by mouth daily as needed for anxiety. And muscle spasm 30 tablet 3  . FLUoxetine (PROZAC) 20 MG tablet TAKE 2 TABLETS (40 MG TOTAL) BY MOUTH DAILY. 60 tablet 2  . topiramate (TOPAMAX) 50 MG tablet TAKE 2 TABLETS (100 MG TOTAL) BY MOUTH DAILY AT BEDTIME FOR MIGRAINE PREVENTION 60 tablet 3  . azelastine (ASTELIN) 0.1 % nasal spray Place 2 sprays into both nostrils 2 (two) times daily. Use in each nostril as directed (Patient not taking: Reported on 11/09/2016) 30 mL 2  . ketorolac (TORADOL) 10 MG tablet Take 1 tablet (10 mg total) by mouth every 6 (six) hours as needed. (Patient not taking: Reported on 11/09/2016) 20 tablet 0   No facility-administered medications prior to visit.     Review of Systems;  Patient denies headache, fevers, malaise, unintentional weight  loss, skin rash, eye pain, sinus congestion and sinus pain, sore throat, dysphagia,  hemoptysis , cough, dyspnea, wheezing, chest pain, palpitations, orthopnea, edema, abdominal pain, nausea, melena, diarrhea, constipation, flank pain, dysuria, hematuria, urinary  Frequency, nocturia, numbness, tingling, seizures,  Focal weakness, Loss of consciousness,  Tremor, insomnia, depression, anxiety, and suicidal ideation.      Objective:  BP 114/76   Pulse 92   Resp 16   Wt 229 lb (103.9 kg)   SpO2 98%   BMI 41.88 kg/m   BP Readings from Last 3 Encounters:  11/09/16 114/76  10/10/16 100/70  10/03/16 138/90    Wt Readings from Last 3 Encounters:  11/09/16 229 lb (103.9 kg)  10/10/16 222 lb (100.7 kg)  10/03/16 219 lb (99.3 kg)    General appearance: alert,  cooperative and appears stated age Ears: normal TM's and external ear canals both ears Throat: lips, mucosa, and tongue normal; teeth and gums normal Neck: no adenopathy, no carotid bruit, supple, symmetrical, trachea midline and thyroid not enlarged, symmetric, no tenderness/mass/nodules Back: symmetric, no curvature. ROM normal. No CVA tenderness. Lungs: clear to auscultation bilaterally Heart: regular rate and rhythm, S1, S2 normal, no murmur, click, rub or gallop Abdomen: soft, non-tender; bowel sounds normal; no masses,  no organomegaly Pulses: 2+ and symmetric Skin: Skin color, texture, turgor normal. No rashes or lesions Lymph nodes: Cervical, supraclavicular, and axillary nodes normal.  Lab Results  Component Value Date   HGBA1C 5.5 05/21/2016   HGBA1C 5.8 04/16/2014   HGBA1C 5.7 07/24/2013    Lab Results  Component Value Date   CREATININE 0.77 08/20/2016   CREATININE 0.82 08/18/2016   CREATININE 0.89 05/21/2016    Lab Results  Component Value Date   WBC 9.9 08/18/2016   HGB 13.8 08/18/2016   HCT 40.8 08/18/2016   PLT 324.0 08/18/2016   GLUCOSE 79 08/20/2016   CHOL 229 (H) 04/26/2015   TRIG 166 (H) 05/21/2016   HDL 42 (L) 05/21/2016   LDLDIRECT 171 (H) 05/21/2016   LDLCALC 154 (H) 04/16/2014   ALT 28 08/20/2016   AST 16 08/20/2016   NA 138 08/20/2016   K 3.8 08/20/2016   CL 108 08/20/2016   CREATININE 0.77 08/20/2016   BUN 12 08/20/2016   CO2 23 08/20/2016   TSH 0.99 10/29/2015   INR 1.0 07/13/2008   HGBA1C 5.5 05/21/2016   MICROALBUR 0.50 06/17/2012    Mr Cervical Spine Wo Contrast  Result Date: 08/23/2015 CLINICAL DATA:  51 year old female with right greater than left cervical neck pain, right face numbness radiating to the right arm. Symptoms for 6 weeks with no known injury. Initial encounter. EXAM: MRI CERVICAL SPINE WITHOUT CONTRAST TECHNIQUE: Multiplanar, multisequence MR imaging of the cervical spine was performed. No intravenous contrast was  administered. COMPARISON:  [facility] cervical spine radiographs 07/27/2013. [facility] Brain MRI 01/12/2011. FINDINGS: Cervical vertebral height and alignment stable from the 2014 radiographs with mild reversal of lordosis, and trace anterolisthesis at both C2-C3 and C3-C4. There is associated upper cervical facet degeneration greater on the right (see below). No marrow edema or evidence of acute osseous abnormality. Cervicomedullary junction is within normal limits. No spinal cord signal abnormality identified. Negative paraspinal soft tissues. C2-C3: Mild to moderate facet hypertrophy greater on the right. Minimal disc bulge. Mild uncovertebral hypertrophy. No spinal stenosis. Mild right C3 foraminal stenosis. C3-C4: Moderate to severe facet hypertrophy on the right, mild on the left. Fluid in the right facet joint which is capacious (series 8,  image 25 and series 9, image 2). No surrounding inflammation. Mild uncovertebral hypertrophy. Mild disc bulge. Borderline to mild spinal stenosis. No left foraminal stenosis. Moderate right C4 foraminal stenosis. C4-C5: Trace anterolisthesis at this level also. Moderate facet hypertrophy greater on the right. Minimal disc bulge. No spinal stenosis. Mild if any right side C5 foraminal stenosis. C5-C6:  Mild disc bulge.  No stenosis. C6-C7: Mild to moderate facet hypertrophy greater on the left. Mild left C7 foraminal stenosis. C7-T1: Moderate bilateral facet hypertrophy. Mild bilateral C8 foraminal stenosis. No upper thoracic spinal stenosis. IMPRESSION: 1. Dominant finding in the cervical spine is facet arthropathy, maximal on the right at C3-C4 where facet joint fluid is noted but no other active inflammation. This facet disease occurs in the setting of chronic multilevel mild spondylolisthesis. 2. Associated moderate right C4 foraminal stenosis and up to mild spinal stenosis at C3-C4. Intermittent mild cervical foraminal stenosis elsewhere. Electronically  Signed   By: Genevie Ann M.D.   On: 08/23/2015 13:55    Assessment & Plan:   Problem List Items Addressed This Visit    Allergic conjunctivitis and rhinitis    Continue azelastine , flonase and restasis.  If no improvement, ent referral       Generalized anxiety disorder    Discussed increasing prozac dose to 60 mg for unmanaged symptoms.          I have changed Ms. Hinely's FLUoxetine to FLUoxetine HCl. I have also changed her topiramate. I am also having her start on oseltamivir and Liraglutide -Weight Management. Additionally, I am having her maintain her cetirizine, levonorgestrel, ibuprofen, rizatriptan, ketorolac, pramipexole, diclofenac, azelastine, fluticasone, clonazePAM, and diazepam.  Meds ordered this encounter  Medications  . DISCONTD: diazepam (VALIUM) 10 MG tablet    Sig: Take 1 tablet (10 mg total) by mouth daily as needed for anxiety. And muscle spasm    Dispense:  15 tablet    Refill:  3    PHARMACY THIS REPLACES PRIOR RX FOR CLONAZEPAM  . DISCONTD: clonazePAM (KLONOPIN) 1 MG tablet    Sig: Take 1 tablet (1 mg total) by mouth at bedtime.    Dispense:  30 tablet    Refill:  5    Refill for 30 days only  . topiramate (TOPAMAX) 100 MG tablet    Sig: Take 1 tablet (100 mg total) by mouth daily.    Dispense:  90 tablet    Refill:  3  . FLUoxetine 60 MG TABS    Sig: Take 60 mg by mouth daily.    Dispense:  90 tablet    Refill:  0  . oseltamivir (TAMIFLU) 75 MG capsule    Sig: Take 1 capsule (75 mg total) by mouth daily. For 10 days for post exposure prevention    Dispense:  10 capsule    Refill:  0  . Liraglutide -Weight Management (SAXENDA) 18 MG/3ML SOPN    Sig: Inject 0.6 mg into the skin daily. Increase dose weekly as follows: Week 2: 1.2 mg daily ; Week 3: 1.8 mg daily; Week 4: 2.4 mg daily    Dispense:  9 mL    Refill:  3  . clonazePAM (KLONOPIN) 1 MG tablet    Sig: Take 1 tablet (1 mg total) by mouth at bedtime.    Dispense:  30 tablet    Refill:  5    . diazepam (VALIUM) 10 MG tablet    Sig: Take 1 tablet (10 mg total) by mouth daily as needed  for anxiety. And muscle spasm    Dispense:  15 tablet    Refill:  3    Medications Discontinued During This Encounter  Medication Reason  . diazepam (VALIUM) 10 MG tablet Reorder  . clonazePAM (KLONOPIN) 1 MG tablet Reorder  . topiramate (TOPAMAX) 50 MG tablet Reorder  . FLUoxetine (PROZAC) 20 MG tablet Reorder  . clonazePAM (KLONOPIN) 1 MG tablet Reorder  . diazepam (VALIUM) 10 MG tablet Reorder   A total of 25 minutes of face to face time was spent with patient more than half of which was spent in counselling about the above mentioned conditions  and coordination of care   Follow-up: No Follow-up on file.   [name], MD

## 2016-11-09 NOTE — Patient Instructions (Addendum)
Resume the azelastine nasal spray and continue Flonase and Restasis (and systane)  If symptoms doe not improve,  ENT referral  I increased the prozac to 60 mg daily  You can resume saxenda once your sinus symptoms are better resolved   Keep the tamiflu prescription and start it if you are exposed to the flu

## 2016-11-10 NOTE — Assessment & Plan Note (Signed)
Discussed increasing prozac dose to 60 mg for unmanaged symptoms.

## 2016-11-10 NOTE — Assessment & Plan Note (Signed)
Continue azelastine , flonase and restasis.  If no improvement, ent referral

## 2016-11-18 ENCOUNTER — Encounter: Payer: Self-pay | Admitting: Internal Medicine

## 2016-11-19 ENCOUNTER — Telehealth: Payer: Self-pay | Admitting: Internal Medicine

## 2016-11-19 ENCOUNTER — Other Ambulatory Visit: Payer: Self-pay | Admitting: Internal Medicine

## 2016-11-19 NOTE — Telephone Encounter (Signed)
Filed PA for Saxenda today with [employer]. This PA is a reauthorization and patient has not lost at least 4 % of body weight in 16 weeks they may not re-authorize [email or web address] .

## 2016-11-20 MED ORDER — FLUOXETINE HCL 20 MG PO TABS: 60.0000 mg | ORAL_TABLET | Freq: Every day | ORAL | 3 refills | Status: DC

## 2016-11-23 ENCOUNTER — Encounter: Payer: Self-pay | Admitting: Internal Medicine

## 2016-11-23 DIAGNOSIS — R5383 Other fatigue: Secondary | ICD-10-CM

## 2016-11-26 NOTE — Telephone Encounter (Signed)
Rec fax stating Kirke Shaggy PA is approved from 11/20/16 until 11/20/17 as long as pt remains covered by the [employer] and there are no changes in her plan benefits.

## 2016-11-30 ENCOUNTER — Other Ambulatory Visit (INDEPENDENT_AMBULATORY_CARE_PROVIDER_SITE_OTHER): Payer: BC Managed Care – PPO

## 2016-11-30 DIAGNOSIS — R5383 Other fatigue: Secondary | ICD-10-CM | POA: Diagnosis not present

## 2016-11-30 LAB — CBC WITH DIFFERENTIAL/PLATELET
Basophils Absolute: 0.1 10*3/uL (ref 0.0–0.1)
Basophils Relative: 0.9 % (ref 0.0–3.0)
Eosinophils Absolute: 0.1 10*3/uL (ref 0.0–0.7)
Eosinophils Relative: 1 % (ref 0.0–5.0)
HCT: 41.2 % (ref 36.0–46.0)
Hemoglobin: 14 g/dL (ref 12.0–15.0)
Lymphocytes Relative: 34.4 % (ref 12.0–46.0)
Lymphs Abs: 3 10*3/uL (ref 0.7–4.0)
MCHC: 34 g/dL (ref 30.0–36.0)
MCV: 93.1 fl (ref 78.0–100.0)
Monocytes Absolute: 0.6 10*3/uL (ref 0.1–1.0)
Monocytes Relative: 6.6 % (ref 3.0–12.0)
Neutro Abs: 5 10*3/uL (ref 1.4–7.7)
Neutrophils Relative %: 57.1 % (ref 43.0–77.0)
Platelets: 324 10*3/uL (ref 150.0–400.0)
RBC: 4.43 Mil/uL (ref 3.87–5.11)
RDW: 12.9 % (ref 11.5–15.5)
WBC: 8.8 10*3/uL (ref 4.0–10.5)

## 2016-11-30 LAB — TSH: TSH: 1.18 u[IU]/mL (ref 0.35–4.50)

## 2016-12-02 ENCOUNTER — Encounter: Payer: Self-pay | Admitting: Internal Medicine

## 2016-12-31 ENCOUNTER — Telehealth: Payer: Self-pay | Admitting: Internal Medicine

## 2016-12-31 NOTE — Telephone Encounter (Signed)
Patient states she is having headache, nausea, vomiting, sore throat, congestion, cough and fever. .  She hasn't had flu shot due to allergic to eggs.  Advised patient that she would need to go to urgent care to be evaluated.

## 2016-12-31 NOTE — Telephone Encounter (Signed)
Pt called c/o flu like symptoms. Pt is having sore throat, productive cough, upset stomach. Pt is supposed to be going to the beach this weekend with family, one of the family members is pregnant. Pt would like to know if there is any way that we could test her for the flu. Please advise, thank you!  Call pt @ [phone]

## 2017-02-08 ENCOUNTER — Ambulatory Visit (INDEPENDENT_AMBULATORY_CARE_PROVIDER_SITE_OTHER): Payer: BC Managed Care – PPO | Admitting: Internal Medicine

## 2017-02-08 ENCOUNTER — Encounter: Payer: Self-pay | Admitting: Internal Medicine

## 2017-02-08 DIAGNOSIS — G4710 Hypersomnia, unspecified: Secondary | ICD-10-CM | POA: Diagnosis not present

## 2017-02-08 DIAGNOSIS — F341 Dysthymic disorder: Secondary | ICD-10-CM

## 2017-02-08 MED ORDER — BUPROPION HCL ER (SR) 100 MG PO TB12: 100.0000 mg | ORAL_TABLET | Freq: Two times a day (BID) | ORAL | 1 refills | Status: DC

## 2017-02-08 NOTE — Patient Instructions (Signed)
Start wellbutrin at 100 mg twice daily.  Take the dose in the morning  and your second dose at  or by 2 pm.    Give this regimen two weeks and my chart me   I agree with stopping saxenda AND USING WEIGHT WATCHERS (food budgeting)   Try using the low carb protein bars in the afternoon to avoid a sugar dump

## 2017-02-08 NOTE — Progress Notes (Signed)
Subjective:  Patient ID: Selena [name], female    DOB: March 15, 1966  Age: 51 y.o. MRN: [id number]  CC: Diagnoses of Hypersomnolence, DEPRESSION/ANXIETY, and Obesity, morbid ([employer]) were pertinent to this visit.  HPI [name] presents for folllow up on GAD.  rcently accpeted with a grant to [employer] for master'd degree in education   She tied saxenda for the second trial , had no improvement in appetite until the highest dose and gained weight due to eating to compensate for nausea.   Joined weight watchers last week  And has  3 lbs loss in one week.  Allowed 23 points daily.  Goal set at 150 ls  Still having difficulty with hypersomnia, despite only taking clonazepam for insomnia.  Not exercising .  Wakes up non groggy,  Starts getting tired and sleepy around 3:30 every day . Averaging 7.5 hours per  Night,  Occasionally hits the snooze.       Outpatient Medications Prior to Visit  Medication Sig Dispense Refill  . cetirizine (ZYRTEC) 10 MG tablet Take 10 mg by mouth daily.      . clonazePAM (KLONOPIN) 1 MG tablet Take 1 tablet (1 mg total) by mouth at bedtime. 30 tablet 5  . diazepam (VALIUM) 10 MG tablet Take 1 tablet (10 mg total) by mouth daily as needed for anxiety. And muscle spasm 15 tablet 3  . diclofenac (VOLTAREN) 75 MG EC tablet Take 1 tablet (75 mg total) by mouth 2 (two) times daily. 60 tablet 1  . FLUoxetine (PROZAC) 20 MG tablet Take 3 tablets (60 mg total) by mouth daily. 90 tablet 3  . fluticasone (FLONASE) 50 MCG/ACT nasal spray Place 2 sprays into both nostrils daily. 16 g 2  . ibuprofen (ADVIL,MOTRIN) 800 MG tablet Take 1 tablet (800 mg total) by mouth every 8 (eight) hours as needed. 30 tablet 0  . levonorgestrel (MIRENA) 20 MCG/24HR IUD 1 Intra Uterine Device (1 each total) by Intrauterine route once. 1 each 0  . pramipexole (MIRAPEX) 0.125 MG tablet 1 tablet daily at bedtime,  Increase weekly as needed 90 tablet 3  . rizatriptan (MAXALT) 10 MG tablet TAKE 1  TABLET (10 MG TOTAL) BY MOUTH AS NEEDED FOR MIGRAINE. MAY REPEAT IN 2 HOURS IF NEEDED 10 tablet 5  . topiramate (TOPAMAX) 100 MG tablet Take 1 tablet (100 mg total) by mouth daily. 90 tablet 3  . oseltamivir (TAMIFLU) 75 MG capsule Take 1 capsule (75 mg total) by mouth daily. For 10 days for post exposure prevention 10 capsule 0  . azelastine (ASTELIN) 0.1 % nasal spray Place 2 sprays into both nostrils 2 (two) times daily. Use in each nostril as directed (Patient not taking: Reported on 11/09/2016) 30 mL 2  . ketorolac (TORADOL) 10 MG tablet Take 1 tablet (10 mg total) by mouth every 6 (six) hours as needed. (Patient not taking: Reported on 11/09/2016) 20 tablet 0  . Liraglutide -Weight Management (SAXENDA) 18 MG/3ML SOPN Inject 0.6 mg into the skin daily. Increase dose weekly as follows: Week 2: 1.2 mg daily ; Week 3: 1.8 mg daily; Week 4: 2.4 mg daily (Patient not taking: Reported on 02/08/2017) 9 mL 3   No facility-administered medications prior to visit.     Review of Systems;  Patient denies headache, fevers, malaise, unintentional weight loss, skin rash, eye pain, sinus congestion and sinus pain, sore throat, dysphagia,  hemoptysis , cough, dyspnea, wheezing, chest pain, palpitations, orthopnea, edema, abdominal pain, nausea, melena, diarrhea, constipation, flank pain,  dysuria, hematuria, urinary  Frequency, nocturia, numbness, tingling, seizures,  Focal weakness, Loss of consciousness,  Tremor, insomnia, depression, anxiety, and suicidal ideation.      Objective:  BP 104/74 (BP Location: Left Arm, Patient Position: Sitting, Cuff Size: Large)   Pulse 78   Temp 97.9 F (36.6 C) (Oral)   Resp 15   Ht 5\' 2"  (1.575 m)   Wt 229 lb (103.9 kg)   SpO2 97%   BMI 41.88 kg/m   BP Readings from Last 3 Encounters:  02/08/17 104/74  11/09/16 114/76  10/10/16 100/70    Wt Readings from Last 3 Encounters:  02/08/17 229 lb (103.9 kg)  11/09/16 229 lb (103.9 kg)  10/10/16 222 lb (100.7 kg)     General appearance: alert, cooperative and appears stated age Ears: normal TM's and external ear canals both ears Throat: lips, mucosa, and tongue normal; teeth and gums normal Neck: no adenopathy, no carotid bruit, supple, symmetrical, trachea midline and thyroid not enlarged, symmetric, no tenderness/mass/nodules Back: symmetric, no curvature. ROM normal. No CVA tenderness. Lungs: clear to auscultation bilaterally Heart: regular rate and rhythm, S1, S2 normal, no murmur, click, rub or gallop Abdomen: soft, non-tender; bowel sounds normal; no masses,  no organomegaly Pulses: 2+ and symmetric Skin: Skin color, texture, turgor normal. No rashes or lesions Lymph nodes: Cervical, supraclavicular, and axillary nodes normal.  Lab Results  Component Value Date   HGBA1C 5.5 05/21/2016   HGBA1C 5.8 04/16/2014   HGBA1C 5.7 07/24/2013    Lab Results  Component Value Date   CREATININE 0.77 08/20/2016   CREATININE 0.82 08/18/2016   CREATININE 0.89 05/21/2016    Lab Results  Component Value Date   WBC 8.8 11/30/2016   HGB 14.0 11/30/2016   HCT 41.2 11/30/2016   PLT 324.0 11/30/2016   GLUCOSE 79 08/20/2016   CHOL 229 (H) 04/26/2015   TRIG 166 (H) 05/21/2016   HDL 42 (L) 05/21/2016   LDLDIRECT 171 (H) 05/21/2016   LDLCALC 154 (H) 04/16/2014   ALT 28 08/20/2016   AST 16 08/20/2016   NA 138 08/20/2016   K 3.8 08/20/2016   CL 108 08/20/2016   CREATININE 0.77 08/20/2016   BUN 12 08/20/2016   CO2 23 08/20/2016   TSH 1.18 11/30/2016   INR 1.0 07/13/2008   HGBA1C 5.5 05/21/2016   MICROALBUR 0.50 06/17/2012    Mr Cervical Spine Wo Contrast  Result Date: 08/23/2015 CLINICAL DATA:  50 year old female with right greater than left cervical neck pain, right face numbness radiating to the right arm. Symptoms for 6 weeks with no known injury. Initial encounter. EXAM: MRI CERVICAL SPINE WITHOUT CONTRAST TECHNIQUE: Multiplanar, multisequence MR imaging of the cervical spine was  performed. No intravenous contrast was administered. COMPARISON:  [facility] cervical spine radiographs 07/27/2013. [facility] Brain MRI 01/12/2011. FINDINGS: Cervical vertebral height and alignment stable from the 2014 radiographs with mild reversal of lordosis, and trace anterolisthesis at both C2-C3 and C3-C4. There is associated upper cervical facet degeneration greater on the right (see below). No marrow edema or evidence of acute osseous abnormality. Cervicomedullary junction is within normal limits. No spinal cord signal abnormality identified. Negative paraspinal soft tissues. C2-C3: Mild to moderate facet hypertrophy greater on the right. Minimal disc bulge. Mild uncovertebral hypertrophy. No spinal stenosis. Mild right C3 foraminal stenosis. C3-C4: Moderate to severe facet hypertrophy on the right, mild on the left. Fluid in the right facet joint which is capacious (series 8, image 25 and series 9, image 2).  No surrounding inflammation. Mild uncovertebral hypertrophy. Mild disc bulge. Borderline to mild spinal stenosis. No left foraminal stenosis. Moderate right C4 foraminal stenosis. C4-C5: Trace anterolisthesis at this level also. Moderate facet hypertrophy greater on the right. Minimal disc bulge. No spinal stenosis. Mild if any right side C5 foraminal stenosis. C5-C6:  Mild disc bulge.  No stenosis. C6-C7: Mild to moderate facet hypertrophy greater on the left. Mild left C7 foraminal stenosis. C7-T1: Moderate bilateral facet hypertrophy. Mild bilateral C8 foraminal stenosis. No upper thoracic spinal stenosis. IMPRESSION: 1. Dominant finding in the cervical spine is facet arthropathy, maximal on the right at C3-C4 where facet joint fluid is noted but no other active inflammation. This facet disease occurs in the setting of chronic multilevel mild spondylolisthesis. 2. Associated moderate right C4 foraminal stenosis and up to mild spinal stenosis at C3-C4. Intermittent mild cervical  foraminal stenosis elsewhere. Electronically Signed   By: Genevie Ann M.D.   On: 08/23/2015 13:55    Assessment & Plan:   Problem List Items Addressed This Visit    DEPRESSION/ANXIETY    Adding wellbutrin for depression,  Continue clonazepam for insomnia       Relevant Medications   buPROPion (WELLBUTRIN SR) 100 MG 12 hr tablet   Hypersomnolence    Adding wellbutrin twice daily starting with 100 mg dose       Obesity, morbid ([facility])    Failed saxenda trial .  Agree with weight watchers  Program.  Diet discussed        A total of 25 minutes of face to face time was spent with patient more than half of which was spent in counselling about the above mentioned conditions  and coordination of care   I have discontinued Ms. Noecker's ketorolac, azelastine, oseltamivir, and Liraglutide -Weight Management. I am also having her start on buPROPion. Additionally, I am having her maintain her cetirizine, levonorgestrel, ibuprofen, rizatriptan, pramipexole, diclofenac, fluticasone, topiramate, clonazePAM, diazepam, and FLUoxetine.  Meds ordered this encounter  Medications  . buPROPion (WELLBUTRIN SR) 100 MG 12 hr tablet    Sig: Take 1 tablet (100 mg total) by mouth 2 (two) times daily.    Dispense:  180 tablet    Refill:  1    Medications Discontinued During This Encounter  Medication Reason  . azelastine (ASTELIN) 0.1 % nasal spray Patient has not taken in last 30 days  . ketorolac (TORADOL) 10 MG tablet Patient has not taken in last 30 days  . Liraglutide -Weight Management (SAXENDA) 18 MG/3ML SOPN Patient has not taken in last 30 days  . oseltamivir (TAMIFLU) 75 MG capsule Therapy completed    Follow-up: Return in about 3 months (around 05/11/2017).   [name], MD

## 2017-02-08 NOTE — Progress Notes (Signed)
Pre visit review using our clinic review tool, if applicable. No additional management support is needed unless otherwise documented below in the visit note. 

## 2017-02-10 ENCOUNTER — Encounter: Payer: Self-pay | Admitting: Internal Medicine

## 2017-02-10 NOTE — Assessment & Plan Note (Signed)
Failed saxenda trial .  Agree with weight watchers  Program.  Diet discussed

## 2017-02-10 NOTE — Assessment & Plan Note (Signed)
Adding wellbutrin twice daily starting with 100 mg dose

## 2017-02-10 NOTE — Assessment & Plan Note (Signed)
Adding wellbutrin for depression,  Continue clonazepam for insomnia

## 2017-03-03 ENCOUNTER — Encounter: Payer: Self-pay | Admitting: Internal Medicine

## 2017-03-04 ENCOUNTER — Telehealth: Payer: Self-pay | Admitting: Internal Medicine

## 2017-03-04 NOTE — Telephone Encounter (Signed)
This got linked to the wrong chart.  It is for b12 injections but not sure who since there were several messages today about b12 injections

## 2017-03-04 NOTE — Telephone Encounter (Signed)
Weekly x 4 the monthly

## 2017-03-04 NOTE — Telephone Encounter (Signed)
Please advise I am confused? Is this for B12?

## 2017-03-05 NOTE — Telephone Encounter (Signed)
Noted, disregarded, thanks

## 2017-04-06 ENCOUNTER — Telehealth: Payer: Self-pay | Admitting: *Deleted

## 2017-04-06 NOTE — Telephone Encounter (Signed)
Called pt to get a little more information and the pt stated that it is not her leg that has been hurting it is her chest on the right side that has been hurting. The pt stated that she hasn't walked in so long and then she started back walking about three days ago. She stated that on the first day she walked for about 45 minutes in the morning trying to avoid the hottest part of the day, that she was walking and all of sudden she gotten the weird feeling on the right side of her chest. She stated that the pain felt like "a tightness almost like a lung tightness". She stated that it worried her so much that she almost quit walking when it happened. She stated that it did go away, but when she walked the next day it happened again. On the third day of walking she stated that she walked inside on the treadmill and she didn't have the pain in her chest. Pt is wondering if she needs to come in and be seen? Please advise.

## 2017-04-06 NOTE — Telephone Encounter (Signed)
Patient requested a call to discuss leg pain, she was advised by Dr. Derrel Nip to walk for exercise, however she right side pain while walking that goes away. Patient has concerns about this . This is happening each time she walks for exercise.  Pt contact [phone]

## 2017-04-06 NOTE — Telephone Encounter (Signed)
If it is NOT happening with walking on the treadmill , it is probably the heat index   I would NOT walk outside in this heat,  use th treadmill instead in a climate controlled environment.  If the pain becomes a problem when usnig the treadmill, then she needs to be seen

## 2017-04-06 NOTE — Telephone Encounter (Signed)
Spoke with pt and informed her of Dr. Lupita Dawn message below. The pt gave a verbal understanding,.

## 2017-04-18 ENCOUNTER — Other Ambulatory Visit: Payer: Self-pay | Admitting: Internal Medicine

## 2017-04-22 ENCOUNTER — Other Ambulatory Visit: Payer: Self-pay | Admitting: Internal Medicine

## 2017-05-10 ENCOUNTER — Other Ambulatory Visit: Payer: Self-pay | Admitting: Internal Medicine

## 2017-05-17 ENCOUNTER — Encounter: Payer: Self-pay | Admitting: Internal Medicine

## 2017-05-17 ENCOUNTER — Ambulatory Visit (INDEPENDENT_AMBULATORY_CARE_PROVIDER_SITE_OTHER): Payer: BC Managed Care – PPO | Admitting: Internal Medicine

## 2017-05-17 VITALS — BP 114/82 | HR 90 | Temp 98.3°F | Resp 15 | Ht 62.0 in | Wt 232.8 lb

## 2017-05-17 DIAGNOSIS — Z1231 Encounter for screening mammogram for malignant neoplasm of breast: Secondary | ICD-10-CM | POA: Diagnosis not present

## 2017-05-17 DIAGNOSIS — G2581 Restless legs syndrome: Secondary | ICD-10-CM | POA: Diagnosis not present

## 2017-05-17 DIAGNOSIS — F411 Generalized anxiety disorder: Secondary | ICD-10-CM | POA: Diagnosis not present

## 2017-05-17 DIAGNOSIS — F419 Anxiety disorder, unspecified: Secondary | ICD-10-CM | POA: Diagnosis not present

## 2017-05-17 DIAGNOSIS — E785 Hyperlipidemia, unspecified: Secondary | ICD-10-CM

## 2017-05-17 DIAGNOSIS — Z1239 Encounter for other screening for malignant neoplasm of breast: Secondary | ICD-10-CM

## 2017-05-17 DIAGNOSIS — R7301 Impaired fasting glucose: Secondary | ICD-10-CM | POA: Diagnosis not present

## 2017-05-17 DIAGNOSIS — F5105 Insomnia due to other mental disorder: Secondary | ICD-10-CM

## 2017-05-17 DIAGNOSIS — E1169 Type 2 diabetes mellitus with other specified complication: Secondary | ICD-10-CM | POA: Diagnosis not present

## 2017-05-17 LAB — COMPREHENSIVE METABOLIC PANEL
ALT: 16 U/L (ref 0–35)
AST: 14 U/L (ref 0–37)
Albumin: 4.3 g/dL (ref 3.5–5.2)
Alkaline Phosphatase: 56 U/L (ref 39–117)
BUN: 13 mg/dL (ref 6–23)
CO2: 24 mEq/L (ref 19–32)
Calcium: 8.9 mg/dL (ref 8.4–10.5)
Chloride: 107 mEq/L (ref 96–112)
Creatinine, Ser: 0.88 mg/dL (ref 0.40–1.20)
GFR: 72.56 mL/min (ref >60.00)
Glucose, Bld: 107 mg/dL — ABNORMAL HIGH (ref 70–99)
Potassium: 3.9 mEq/L (ref 3.5–5.1)
Sodium: 139 mEq/L (ref 135–145)
Total Bilirubin: 0.3 mg/dL (ref 0.2–1.2)
Total Protein: 6.5 g/dL (ref 6.0–8.3)

## 2017-05-17 LAB — HEMOGLOBIN A1C: Hgb A1c MFr Bld: 5.8 % (ref 4.6–6.5)

## 2017-05-17 LAB — LIPID PANEL
Cholesterol: 236 mg/dL — ABNORMAL HIGH (ref 0–200)
HDL: 53.4 mg/dL (ref >39.00)
LDL Cholesterol: 157 mg/dL — ABNORMAL HIGH (ref 0–99)
NonHDL: 182.79
Total CHOL/HDL Ratio: 4
Triglycerides: 128 mg/dL (ref 0.0–149.0)
VLDL: 25.6 mg/dL (ref 0.0–40.0)

## 2017-05-17 MED ORDER — DIAZEPAM 10 MG PO TABS: 10.0000 mg | ORAL_TABLET | Freq: Every day | ORAL | 3 refills | Status: DC | PRN

## 2017-05-17 MED ORDER — LORCASERIN HCL 10 MG PO TABS: 1.0000 | ORAL_TABLET | Freq: Every day | ORAL | 3 refills | Status: DC

## 2017-05-17 NOTE — Patient Instructions (Addendum)
I share your frustration about not being able to lose weight!   We discussed a trial of Belviq to help curb your appetite.    Find a "buddy" (sponsor) to call when you feel on the verge of blowing it with binging   I would like you to consider being referred to Dr Leafy Ro if you have not been successful at losing 12 lbs by your next visit in 3 months

## 2017-05-17 NOTE — Assessment & Plan Note (Signed)
Prior treatment failures with phentermine and Saxenda due to intolerance.  Needs appetite suppressant.  Discussed inreasing her water and fiber intake, intensifying her exercise into a 30 minute workout, and trial of Belviq.Marland Kitchen Also discussed referral to Dr Leafy Ro

## 2017-05-17 NOTE — Assessment & Plan Note (Signed)
Now managed with clonazepam and mirapex

## 2017-05-17 NOTE — Progress Notes (Signed)
Subjective:  Patient ID: Selena [name], female    DOB: November 28, 1965  Age: 51 y.o. MRN: [id number]  CC: The primary encounter diagnosis was Hyperlipidemia associated with type 2 diabetes mellitus ([name]). Diagnoses of Impaired fasting glucose, Restless legs syndrome, Morbid obesity ([name]), Insomnia secondary to anxiety, Generalized anxiety disorder, and Breast cancer screening were also pertinent to this visit.  HPI [name] presents for follow up on GAD, managed with prozac and clonazepam, morbid obesity  with recent Saxenda treatment failure and prior phentermine intolerance.  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  She admits to binge eating ("an entire bag of oreos" which she drove to the supermarket to get). She was walking daily during the summer an average of 60 minutes daily.  She is frustrated by her inability to follow a diet.  Over 20 minutes spent discussing her motivation, current mood,  And obstacles to weight loss. She is allowed 23 pts daily on the [facility] program.  She states that she feels  hungry all the time . She is disheartened bc her friend lost 25 lbs during the same period of time and "didn't need to lose the weight." She felt that her friend was "skliping meals and exercising too much."  She is apprehensive about seeing her friend back at work this week given the disparity in results they had.   GAD:  Using clonazepam and mirapex at bedtime. Rare use of valium .  Feels that her mood is stable.  Denies depression  Untreated symptoms of anxiety.  Sleeping well. Doing well in her classes.   Outpatient Medications Prior to Visit  Medication Sig Dispense Refill  . buPROPion (WELLBUTRIN SR) 100 MG 12 hr tablet Take 1 tablet (100 mg total) by mouth 2 (two) times daily. 180 tablet 1  . cetirizine (ZYRTEC) 10 MG tablet Take 10  mg by mouth daily.      . clonazePAM (KLONOPIN) 1 MG tablet TAKE 1 TABLET BY MOUTH AT BEDTIME 30 tablet 3  . FLUoxetine (PROZAC) 20 MG tablet TAKE 3 TABLETS (60 MG TOTAL) BY MOUTH DAILY. 90 tablet 3  . fluticasone (FLONASE) 50 MCG/ACT nasal spray Place 2 sprays into both nostrils daily. 16 g 2  . ibuprofen (ADVIL,MOTRIN) 800 MG tablet Take 1 tablet (800 mg total) by mouth every 8 (eight) hours as needed. 30 tablet 0  . levonorgestrel (MIRENA) 20 MCG/24HR IUD 1 Intra Uterine Device (1 each total) by Intrauterine route once. 1 each 0  . pramipexole (MIRAPEX) 0.125 MG tablet TAKE 1 TABLET BY MOUTH DAILY AT BEDTIME. INCREASE WEEKLY AS NEEDED 90 tablet 3  . rizatriptan (MAXALT) 10 MG tablet TAKE 1 TABLET (10 MG TOTAL) BY MOUTH AS NEEDED FOR MIGRAINE. MAY REPEAT IN 2 HOURS IF NEEDED 10 tablet 5  . topiramate (TOPAMAX) 100 MG tablet Take 1 tablet (100 mg total) by mouth daily. 90 tablet 3  . diazepam (VALIUM) 10 MG tablet Take 1 tablet (10 mg total) by mouth daily as needed for anxiety. And muscle spasm 15 tablet 3  . diclofenac (VOLTAREN) 75 MG EC tablet Take 1 tablet (75 mg total) by mouth 2 (two) times daily. (Patient not taking: Reported on 05/17/2017) 60 tablet 1   No facility-administered medications prior to visit.     Review of Systems;  Patient denies headache,  fevers, malaise, unintentional weight loss, skin rash, eye pain, sinus congestion and sinus pain, sore throat, dysphagia,  hemoptysis , cough, dyspnea, wheezing, chest pain, palpitations, orthopnea, edema, abdominal pain, nausea, melena, diarrhea, constipation, flank pain, dysuria, hematuria, urinary  Frequency, nocturia, numbness, tingling, seizures,  Focal weakness, Loss of consciousness,  Tremor, insomnia, depression, anxiety, and suicidal ideation.      Objective:  BP 114/82 (BP Location: Left Arm, Patient Position: Sitting, Cuff Size: Normal)   Pulse 90   Temp 98.3 F (36.8 C) (Oral)   Resp 15   Ht 5\' 2"  (1.575 m)   Wt 232 lb  12.8 oz (105.6 kg)   SpO2 94%   BMI 42.58 kg/m   BP Readings from Last 3 Encounters:  05/17/17 114/82  02/08/17 104/74  11/09/16 114/76    Wt Readings from Last 3 Encounters:  05/17/17 232 lb 12.8 oz (105.6 kg)  02/08/17 229 lb (103.9 kg)  11/09/16 229 lb (103.9 kg)    General appearance: alert, cooperative and appears stated age Ears: normal TM's and external ear canals both ears Throat: lips, mucosa, and tongue normal; teeth and gums normal Neck: no adenopathy, no carotid bruit, supple, symmetrical, trachea midline and thyroid not enlarged, symmetric, no tenderness/mass/nodules Back: symmetric, no curvature. ROM normal. No CVA tenderness. Lungs: clear to auscultation bilaterally Heart: regular rate and rhythm, S1, S2 normal, no murmur, click, rub or gallop Abdomen: soft, non-tender; bowel sounds normal; no masses,  no organomegaly Pulses: 2+ and symmetric Skin: Skin color, texture, turgor normal. No rashes or lesions Lymph nodes: Cervical, supraclavicular, and axillary nodes normal.  Lab Results  Component Value Date   HGBA1C 5.5 05/21/2016   HGBA1C 5.8 04/16/2014   HGBA1C 5.7 07/24/2013    Lab Results  Component Value Date   CREATININE 0.77 08/20/2016   CREATININE 0.82 08/18/2016   CREATININE 0.89 05/21/2016    Lab Results  Component Value Date   WBC 8.8 11/30/2016   HGB 14.0 11/30/2016   HCT 41.2 11/30/2016   PLT 324.0 11/30/2016   GLUCOSE 79 08/20/2016   CHOL 229 (H) 04/26/2015   TRIG 166 (H) 05/21/2016   HDL 42 (L) 05/21/2016   LDLDIRECT 171 (H) 05/21/2016   LDLCALC 154 (H) 04/16/2014   ALT 28 08/20/2016   AST 16 08/20/2016   NA 138 08/20/2016   K 3.8 08/20/2016   CL 108 08/20/2016   CREATININE 0.77 08/20/2016   BUN 12 08/20/2016   CO2 23 08/20/2016   TSH 1.18 11/30/2016   INR 1.0 07/13/2008   HGBA1C 5.5 05/21/2016   MICROALBUR 0.50 06/17/2012     Assessment & Plan:   Problem List Items Addressed This Visit    Restless legs syndrome     Having good results with  Mirapex. No changes today       Morbid obesity ([name])    Prior treatment failures with phentermine and Saxenda due to intolerance.  Needs appetite suppressant.  Discussed inreasing her water and fiber intake, intensifying her exercise into a 30 minute workout, and trial of Belviq.Marland Kitchen Also discussed referral to Dr Leafy Ro       Relevant Medications   Lorcaserin HCl 10 MG TABS   Insomnia secondary to anxiety    Now managed with clonazepam and mirapex      Relevant Medications   diazepam (VALIUM) 10 MG tablet   Generalized anxiety disorder    Controlled with prozac.  No changes today        Other Visit Diagnoses  Hyperlipidemia associated with type 2 diabetes mellitus ([place])    -  Primary   Relevant Orders   Lipid panel   Impaired fasting glucose       Relevant Orders   Comprehensive metabolic panel   Hemoglobin A1c   Breast cancer screening       Relevant Orders   MM SCREENING BREAST TOMO BILATERAL     A total of 25 minutes was spent with patient more than half of which was spent in counseling patient on the above mentioned issues , reviewing and explaining recent labs and imaging studies done, and coordination of care.  I have discontinued Ms. Cerritos's diclofenac. I am also having her start on Lorcaserin HCl. Additionally, I am having her maintain her cetirizine, levonorgestrel, ibuprofen, rizatriptan, fluticasone, topiramate, buPROPion, FLUoxetine, pramipexole, clonazePAM, and diazepam.  Meds ordered this encounter  Medications  . Lorcaserin HCl 10 MG TABS    Sig: Take 1 tablet by mouth daily before breakfast.    Dispense:  30 tablet    Refill:  3  . diazepam (VALIUM) 10 MG tablet    Sig: Take 1 tablet (10 mg total) by mouth daily as needed for anxiety. And muscle spasm    Dispense:  15 tablet    Refill:  3    Medications Discontinued During This Encounter  Medication Reason  . diclofenac (VOLTAREN) 75 MG EC tablet Patient has not taken in  last 30 days  . diazepam (VALIUM) 10 MG tablet Reorder    Follow-up: Return in about 3 months (around 08/17/2017).   [name], MD

## 2017-05-17 NOTE — Assessment & Plan Note (Signed)
Having good results with  Mirapex. No changes today

## 2017-05-17 NOTE — Assessment & Plan Note (Signed)
Controlled with prozac.  No changes today

## 2017-05-18 ENCOUNTER — Encounter: Payer: Self-pay | Admitting: Internal Medicine

## 2017-05-19 ENCOUNTER — Telehealth: Payer: Self-pay

## 2017-05-19 NOTE — Telephone Encounter (Signed)
Received a fax from [facility] stating that pt has requested a rx for belviq. I do not see this in the pt's current med list or in the historical med list.

## 2017-05-19 NOTE — Telephone Encounter (Signed)
The genreic for belviq is lorcaserin  It was prescribed at her recent visit for appetie suppression

## 2017-05-21 ENCOUNTER — Other Ambulatory Visit: Payer: Self-pay

## 2017-05-21 MED ORDER — LORCASERIN HCL 10 MG PO TABS: 1.0000 | ORAL_TABLET | Freq: Every day | ORAL | 3 refills | Status: DC

## 2017-05-21 NOTE — Telephone Encounter (Signed)
Medication was faxed to pharmacy

## 2017-05-26 ENCOUNTER — Encounter: Payer: Self-pay | Admitting: Internal Medicine

## 2017-05-28 ENCOUNTER — Telehealth: Payer: Self-pay | Admitting: *Deleted

## 2017-05-28 NOTE — Telephone Encounter (Signed)
[facility] has requested a prior authorization for Belviq

## 2017-05-31 NOTE — Telephone Encounter (Signed)
PA started on CoverMyMeds PA case ID [id number] .

## 2017-06-01 NOTE — Telephone Encounter (Signed)
Dr.Tullo pt

## 2017-06-01 NOTE — Telephone Encounter (Signed)
The patient returned your call and she is aware that her medication has been approved and the pharmacy is aware.

## 2017-06-01 NOTE — Telephone Encounter (Signed)
Medication has been approved. LMTCB to let the pt know, but also spoke with the pharmacy to let them know that it has been approved.

## 2017-06-16 ENCOUNTER — Encounter: Payer: Self-pay | Admitting: Internal Medicine

## 2017-06-17 ENCOUNTER — Other Ambulatory Visit: Payer: Self-pay | Admitting: Internal Medicine

## 2017-06-24 ENCOUNTER — Telehealth: Payer: Self-pay | Admitting: Internal Medicine

## 2017-06-24 ENCOUNTER — Emergency Department: Payer: BC Managed Care – PPO

## 2017-06-24 ENCOUNTER — Emergency Department
Admission: EM | Admit: 2017-06-24 | Discharge: 2017-06-24 | Disposition: A | Discharge status: Home / Self Care | Payer: BC Managed Care – PPO | Attending: Emergency Medicine | Admitting: Emergency Medicine

## 2017-06-24 DIAGNOSIS — I10 Essential (primary) hypertension: Secondary | ICD-10-CM | POA: Insufficient documentation

## 2017-06-24 DIAGNOSIS — Z79899 Other long term (current) drug therapy: Secondary | ICD-10-CM | POA: Diagnosis not present

## 2017-06-24 DIAGNOSIS — J45909 Unspecified asthma, uncomplicated: Secondary | ICD-10-CM | POA: Insufficient documentation

## 2017-06-24 DIAGNOSIS — R079 Chest pain, unspecified: Secondary | ICD-10-CM | POA: Diagnosis present

## 2017-06-24 DIAGNOSIS — M940 Chondrocostal junction syndrome [Tietze]: Secondary | ICD-10-CM | POA: Diagnosis not present

## 2017-06-24 DIAGNOSIS — R0789 Other chest pain: Secondary | ICD-10-CM | POA: Insufficient documentation

## 2017-06-24 HISTORY — DX: Chondrocostal junction syndrome (tietze): M94.0

## 2017-06-24 LAB — BASIC METABOLIC PANEL
Anion gap: 7 (ref 5–15)
BUN: 14 mg/dL (ref 6–20)
CO2: 22 mmol/L (ref 22–32)
Calcium: 9 mg/dL (ref 8.9–10.3)
Chloride: 110 mmol/L (ref 101–111)
Creatinine, Ser: 0.85 mg/dL (ref 0.44–1.00)
GFR calc Af Amer: >60 mL/min (ref >60)
GFR calc non Af Amer: >60 mL/min (ref >60)
Glucose, Bld: 114 mg/dL — ABNORMAL HIGH (ref 65–99)
Potassium: 3.2 mmol/L — ABNORMAL LOW (ref 3.5–5.1)
Sodium: 139 mmol/L (ref 135–145)

## 2017-06-24 LAB — CBC
HCT: 39.6 % (ref 35.0–47.0)
Hemoglobin: 13.7 g/dL (ref 12.0–16.0)
MCH: 32.4 pg (ref 26.0–34.0)
MCHC: 34.6 g/dL (ref 32.0–36.0)
MCV: 93.5 fL (ref 80.0–100.0)
Platelets: 313 10*3/uL (ref 150–440)
RBC: 4.24 MIL/uL (ref 3.80–5.20)
RDW: 13.1 % (ref 11.5–14.5)
WBC: 9.1 10*3/uL (ref 3.6–11.0)

## 2017-06-24 LAB — TROPONIN I: Troponin I: <0.03 ng/mL (ref <0.03)

## 2017-06-24 MED ORDER — PREDNISONE 10 MG PO TABS: ORAL_TABLET | ORAL | 0 refills | Status: DC

## 2017-06-24 NOTE — Discharge Instructions (Signed)

## 2017-06-24 NOTE — Telephone Encounter (Signed)
Patient Name: Selena [name] DOB: [birth year] Initial Comment Caller states she has chest pain on and off for 3 weeks, sometime short of breath, xfer from office for triage Nurse Assessment Nurse: Markus Daft, RN, Sherre Poot Date/Time Eilene Ghazi Time): 06/24/2017 9:48:14 AM Confirm and document reason for call. If symptomatic, describe symptoms. ---Caller states she has chest pain with breathing in and out, on and off for 3 weeks, sometime short of breath. She can still take a DB. Sweating with normal activities which is not normal for her. -- Seen by OBGYN doctor yesterday, not pregnant. She thinks she has costochondritis. She is taking Ibuprofen which helps. Does the patient have any new or worsening symptoms? ---Yes Will a triage be completed? ---Yes Related visit to physician within the last 2 weeks? ---No Does the PT have any chronic conditions? (i.e. diabetes, asthma, etc.) ---Yes List chronic conditions. ---Fibromyalgia Is the patient pregnant or possibly pregnant? (Ask all females between the ages of 48-55) ---No Is this a behavioral health or substance abuse call? ---No Guidelines Guideline Title Affirmed Question Affirmed Notes Chest Pain Visible sweat on face or sweat dripping down face Final Disposition User Call EMS [address] Now [place], [place], [place] Comments States it is never been this severe where it hurts in her upper back also. Rates CP now at 3-4/10 and dull pain. Able to take a deep breath. Tender to touch. Fit bit reading 130 BPM with walking which is unusual - noticed last week. Disagree/Comply: Disagree Disagree/Comply Reason: Disagree with instructions

## 2017-06-24 NOTE — Telephone Encounter (Signed)
Patient did not go to ED this am as advised. She says she wanted to talk with you first because she thinks that it is costochondritis. I advised patient that you were out of office until Monday and she should go to ED or urgent care to have EKG done today. Patient stated she would comply.

## 2017-06-24 NOTE — Telephone Encounter (Signed)
Pt called and stated that she went to gyn yesterday for a check up and mentioned that she has been having chest pain and SOB. Pt states that it has been on and off for 3 weeks. She also mentioned that she is sweating more easily. Pt does have costacondritis and not sure if symptoms of that or something else. Sent call to team health triage.  Call pt @ [phone]

## 2017-06-24 NOTE — Telephone Encounter (Signed)
fyi

## 2017-06-24 NOTE — ED Provider Notes (Signed)
[facility] Emergency Department Provider Note  ____________________________________________   First MD Initiated Contact with Patient 06/24/17 1724     (approximate)  I have reviewed the triage vital signs and the nursing notes.   HISTORY  Chief Complaint Chest Pain    HPI Selena [name] is a 51 y.o. female with past medical history as listed below and includes a history of costochondritis who presents for evaluation of about 3 weeks of persistent and somewhat worsening pain in the left upper chest wall that also radiates around to the left shoulder.  It started intermittently almost a month ago and got much worse after she did a CPR class in which she had to do chest compressions on a mannequin.  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  She has noticed pain in her left upper chest has gotten worse and is more or less constant although it is made worse with movement including movement of her body and movement of her left upper extremity.  The chest wall is sore to the touch although there is no rash or redness.  She denies fever/chills, shortness of breath, nausea, vomiting, cough, abdominal pain, and dysuria.  Nothing in particular makes it better and she has been taking ibuprofen because that helped prior episodes of costochondritis.  she does not use any form of exogenous estrogen, has had no recent immobilizations, does not have any active malignancies, and has had no recent surgeries.  There is no history of blood clots in her family or in her own personal history.  She does not smoke.  She does not have central back pain, just the pain in her left shoulder that is radiating around from the front. she does not have diabetes.    Past Medical History:  Diagnosis Date  . Anemia, iron deficiency   . Anxiety   . Asthma   . Colon polyps 2014  .  Costochondritis   . Depression   . Eating disorder    Binge eating  . Fibromyalgia   . Gallstones   . Hyperlipidemia   . Hypertension     Patient Active Problem List   Diagnosis Date Noted  . Allergic conjunctivitis and rhinitis 10/01/2016  . Atypical chest pain 12/22/2015  . Ataxia 09/05/2015  . Galactorrhea 09/05/2015  . Restless legs syndrome 04/27/2015  . Insomnia secondary to anxiety 12/22/2014  . IBS (irritable bowel syndrome) 04/21/2014  . Other and unspecified hyperlipidemia 04/21/2014  . Unspecified vitamin D deficiency 04/21/2014  . Encounter for preventive health examination 04/21/2014  . Cervicalgia 07/25/2013  . Morbid obesity ([place]) 07/25/2013  . Generalized anxiety disorder 07/25/2013  . 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 Snoring disorder 03/11/2013  . Fatty liver 08/20/2011  . Allergic rhinitis 04/24/2011  . 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 DEPRESSION/ANXIETY 04/09/2010  . KNEE PAIN, BILATERAL 04/01/2010  . Migraine without aura 05/30/2008  . HYPERTENSION 05/30/2008  . ASTHMA, EXERCISE INDUCED 05/30/2008  . PREMENSTRUAL DYSPHORIC SYNDROME 05/30/2008  . FIBROMYALGIA 05/30/2008    Past Surgical History:  Procedure Laterality Date  . CHOLECYSTECTOMY    . 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 GANGLION CYST EXCISION    . TONSILLECTOMY  1989    Prior to Admission medications   Medication Sig Start Date End Date Taking? Authorizing Provider  buPROPion (WELLBUTRIN SR) 100 MG 12 hr tablet Take 1 tablet (  100 mg total) by mouth 2 (two) times daily. 02/08/17   [name], MD  cetirizine (ZYRTEC) 10 MG tablet Take 10 mg by mouth daily.      [provider]  clonazePAM (KLONOPIN) 1 MG tablet TAKE 1 TABLET BY MOUTH AT BEDTIME 05/10/17   [name], MD  diazepam (VALIUM) 10 MG tablet Take 1 tablet (10 mg total) by mouth daily as needed for anxiety. And muscle spasm 05/17/17   [name], MD  FLUoxetine (PROZAC) 20 MG tablet  TAKE 3 TABLETS (60 MG TOTAL) BY MOUTH DAILY. 04/20/17   [name], MD  fluticasone (FLONASE) 50 MCG/ACT nasal spray Place 2 sprays into both nostrils daily. 10/20/16   Burnard Hawthorne, FNP  ibuprofen (ADVIL,MOTRIN) 800 MG tablet Take 1 tablet (800 mg total) by mouth every 8 (eight) hours as needed. 10/18/15   Leone Haven, MD  levonorgestrel (MIRENA) 20 MCG/24HR IUD 1 Intra Uterine Device (1 each total) by Intrauterine route once. 10/30/12   Janith Lima, MD  Lorcaserin HCl 10 MG TABS Take 1 tablet by mouth daily before breakfast. 05/21/17   [name], MD  pramipexole (MIRAPEX) 0.125 MG tablet TAKE 1 TABLET BY MOUTH DAILY AT BEDTIME. INCREASE WEEKLY AS NEEDED 04/22/17   [name], MD  predniSONE (DELTASONE) 10 MG tablet Take 6 tabs (60 mg) PO x 3 days, then take 4 tabs (40 mg) PO x 3 days, then take 2 tabs (20 mg) PO x 3 days, then take 1 tab (10 mg) PO x 3 days, then take 1/2 tab (5 mg) PO x 4 days. 06/24/17   Hinda Kehr, MD  rizatriptan (MAXALT) 10 MG tablet TAKE 1 TABLET (10 MG TOTAL) BY MOUTH AS NEEDED FOR MIGRAINE. MAY REPEAT IN 2 HOURS IF NEEDED 04/20/16   Thersa Salt G, DO  topiramate (TOPAMAX) 100 MG tablet Take 1 tablet (100 mg total) by mouth daily. 11/09/16   [name], MD    Allergies Augmentin [amoxicillin-pot clavulanate]; Eggs or egg-derived products; and Sulfonamide derivatives  Family History  Problem Relation Age of Onset  . Hyperlipidemia Mother   . Heart disease Mother        Atrial fibrilation  . Cancer Mother        ? Melanoma  . Diabetes Father   . Mental illness Father        Bipolar  . Cancer Sister        Clear cell sarcoma in leg  . Hypothyroidism Sister   . Hypertension Maternal Grandmother   . Heart disease Maternal Grandmother        Afib  . Arthritis Paternal Grandmother   . Colon cancer Neg Hx   . Rectal cancer Neg Hx     Social History Social History  Substance Use Topics  . Smoking status: Never Smoker  . Smokeless  tobacco: Never Used  . Alcohol use Yes     Comment: 4 Times a month    Review of Systems Constitutional: No fever/chills Eyes: No visual changes. ENT: No sore throat. Cardiovascular: persistent and worsening left-sided chest wall pain and tenderness for almost a month Respiratory: Denies shortness of breath. Gastrointestinal: No abdominal pain.  No nausea, no vomiting.  No diarrhea.  No constipation. Genitourinary: Negative for dysuria. Musculoskeletal: Negative for neck pain.  Negative for back pain. Integumentary: Negative for rash. Neurological: Negative for headaches, focal weakness or numbness.   ____________________________________________   PHYSICAL EXAM:  VITAL SIGNS:  ED Triage Vitals  Enc Vitals Group     BP 06/24/17 1619 127/79     Pulse Rate 06/24/17 1619 91     Resp 06/24/17 1619 18     Temp 06/24/17 1619 98.1 F (36.7 C)     Temp Source 06/24/17 1619 Oral     SpO2 06/24/17 1619 100 %     Weight 06/24/17 1620 106.1 kg (234 lb)     Height 06/24/17 1620 1.575 m (5\' 2" )     Head Circumference --      Peak Flow --      Pain Score 06/24/17 1619 4     Pain Loc --      Pain Edu? --      Excl. in [place]? --     Constitutional: Alert and oriented. Well appearing and in no acute distress. Eyes: Conjunctivae are normal.  Head: Atraumatic. Nose: No congestion/rhinnorhea. Mouth/Throat: Mucous membranes are moist. Neck: No stridor.  No meningeal signs.   Cardiovascular: Normal rate, regular rhythm. Good peripheral circulation. Grossly normal heart sounds. highly reproducible chest wall tenderness all throughout the left upper chest wall and also reproducible with range of motion of the left shoulder although less so. Respiratory: Normal respiratory effort.  No retractions. Lungs CTAB. Gastrointestinal: Soft and nontender. No distention.  Musculoskeletal: No lower extremity tenderness nor edema. No gross deformities of extremities. reproducible pain in the left chest wall  with range of motion of the left upper extremity.  No back pain Neurologic:  Normal speech and language. No gross focal neurologic deficits are appreciated.  Skin:  Skin is warm, dry and intact. No rash noted. Psychiatric: Mood and affect are normal. Speech and behavior are normal.  ____________________________________________   LABS (all labs ordered are listed, but only abnormal results are displayed)  Labs Reviewed  BASIC METABOLIC PANEL - Abnormal; Notable for the following:       Result Value   Potassium 3.2 (*)    Glucose, Bld 114 (*)    All other components within normal limits  CBC  TROPONIN I   ____________________________________________  EKG  ED ECG REPORT I, Aikeem Lilley, the attending physician, personally viewed and interpreted this ECG.  Date: 06/24/2017 EKG Time: 16:21 Rate: 88 Rhythm: normal sinus rhythm QRS Axis: normal Intervals: normal ST/T Wave abnormalities: inverted T waves in lead 3, otherwise unremarkable Narrative Interpretation: no evidence of acute ischemia  ____________________________________________  RADIOLOGY   Dg Chest 2 View  Result Date: 06/24/2017 CLINICAL DATA:  Chest pain for 3 weeks.  Left-sided pain. EXAM: CHEST  2 VIEW COMPARISON:  CT chest 07/14/2008 FINDINGS: The heart size and mediastinal contours are within normal limits. Both lungs are clear. The visualized skeletal structures are unremarkable. IMPRESSION: No active cardiopulmonary disease. Electronically Signed   By: Kathreen Devoid   On: 06/24/2017 16:45    ____________________________________________   PROCEDURES  Critical Care performed: No   Procedure(s) performed:   Procedures   ____________________________________________   INITIAL IMPRESSION / ASSESSMENT AND PLAN / ED COURSE  Pertinent labs & imaging results that were available during my care of the patient were reviewed by me and considered in my medical decision making (see chart for details).  the  patient is well-appearing and in no acute distress with highly reproducible chest wall tenderness. She is PERC negative and has a low-risk HEART score of 3.  We had an extensive discussion about the differential diagnosis, which includes osteomyelitis/discitis, pulmonary embolism, musculoskeletal chest wall pain, costochondritis, ACS,  pneumothorax, etc., but fortunately there is no signs of acute or emergent medical condition at this time.  I think most likely she is suffering from musculoskeletal pain and/or costochondritis.  There is no indication for further work up at this time.  I reviewed her prior medical record including her past medical history and personally reviewed her lab work which shows no concerning abnormalities.  Her chest x-ray also does not show any acute findings and her EKG is reassuring.  The patient is comfortable with the plan for NSAIDs and I will also give her a prednisone taper which she states has helped in the past.  She will follow up with her primary care provider.  I gave my usual and customary return precautions.      Clinical Course as of Jun 24 1754  Thu Jun 24, 2017  1708 Troponin I: <0.03 [CF]  1708 No acute abnormalities DG Chest 2 View [CF]    Clinical Course User Index [CF] Hinda Kehr, MD    ____________________________________________  FINAL CLINICAL IMPRESSION(S) / ED DIAGNOSES  Final diagnoses:  Chest wall pain  Costochondritis     MEDICATIONS GIVEN DURING THIS VISIT:  Medications - No data to display   NEW OUTPATIENT MEDICATIONS STARTED DURING THIS VISIT:  New Prescriptions   PREDNISONE (DELTASONE) 10 MG TABLET    Take 6 tabs (60 mg) PO x 3 days, then take 4 tabs (40 mg) PO x 3 days, then take 2 tabs (20 mg) PO x 3 days, then take 1 tab (10 mg) PO x 3 days, then take 1/2 tab (5 mg) PO x 4 days.    Modified Medications   No medications on file    Discontinued Medications   No medications on file     Note:  This document was  prepared using Dragon voice recognition software and may include unintentional dictation errors.    Hinda Kehr, MD 06/24/17 1755

## 2017-06-24 NOTE — ED Triage Notes (Signed)
Patient comes in with complaints of chest pain for 3 weeks since doing chest compressions on manikin.  Chest pain is on left side and radiates to back.  Patient stated that pain was worse with activity such as walking, but today has been constant and unchanged with activity.

## 2017-06-25 ENCOUNTER — Other Ambulatory Visit: Payer: Self-pay

## 2017-06-25 DIAGNOSIS — R0982 Postnasal drip: Secondary | ICD-10-CM

## 2017-06-25 MED ORDER — FLUTICASONE PROPIONATE 50 MCG/ACT NA SUSP: 2.0000 | Freq: Every day | NASAL | 2 refills | Status: DC

## 2017-06-30 NOTE — Telephone Encounter (Signed)
Error

## 2017-07-01 ENCOUNTER — Encounter: Payer: Self-pay | Admitting: Internal Medicine

## 2017-07-02 ENCOUNTER — Telehealth: Payer: Self-pay

## 2017-07-02 MED ORDER — CYCLOBENZAPRINE HCL 10 MG PO TABS: 10.0000 mg | ORAL_TABLET | Freq: Three times a day (TID) | ORAL | 0 refills | Status: DC | PRN

## 2017-07-02 NOTE — Telephone Encounter (Signed)
Patient is aware of appointment date/time & that her medication is at the pharmacy  Please schedule patient for 07/07/2017 4:30pm - ER follow up

## 2017-07-02 NOTE — Addendum Note (Signed)
Addended by: [name] on: 07/02/2017 12:23 PM   Modules accepted: Orders

## 2017-07-02 NOTE — Telephone Encounter (Addendum)
She is still on the prednisone ? The dose she was prescribed is 60 mg daily x 3 days,  Then 40 mg daily x  Days ,  Etc..  Add tylenol 1000 mg every 12 hours..  Try using a heating pad and I will call in a muscle relaxer to use  Every 8 hours or just in the evenings.  You can use a 4:30 appt next week  For ER follow up

## 2017-07-02 NOTE — Telephone Encounter (Signed)
Scheduled

## 2017-07-02 NOTE — Telephone Encounter (Signed)
Called patient regarding MyChart message from her this morning. Patient was seen in the ER for chest pain on 06/24/2017 and was told that it was costochondritis. She was put on on a prednisone taper and instructed to take ibuprofen. She felt better at first but now that her prednisone has tapered down she is feeling the tightness/pain in her chest and back again. She denies any kind of distress or any other symptoms. She was told by MD in the ER to call our office if symptoms return. Please advise.

## 2017-07-07 ENCOUNTER — Ambulatory Visit (INDEPENDENT_AMBULATORY_CARE_PROVIDER_SITE_OTHER): Payer: BC Managed Care – PPO | Admitting: Internal Medicine

## 2017-07-07 ENCOUNTER — Encounter: Payer: Self-pay | Admitting: Internal Medicine

## 2017-07-07 VITALS — BP 120/82 | HR 96 | Temp 98.1°F | Resp 16 | Ht 62.0 in | Wt 234.8 lb

## 2017-07-07 DIAGNOSIS — I471 Supraventricular tachycardia: Secondary | ICD-10-CM | POA: Diagnosis not present

## 2017-07-07 DIAGNOSIS — R0789 Other chest pain: Secondary | ICD-10-CM | POA: Diagnosis not present

## 2017-07-07 DIAGNOSIS — S29011A Strain of muscle and tendon of front wall of thorax, initial encounter: Secondary | ICD-10-CM | POA: Diagnosis not present

## 2017-07-07 NOTE — Patient Instructions (Addendum)
I am making 2 referrals:  Cardiology for the racing heart  PT for the persistent chest wall pain

## 2017-07-07 NOTE — Progress Notes (Signed)
Subjective:  Patient ID: Selena [name], female    DOB: [birth year]  Age: 51 y.o. MRN: [id number]  CC: The primary encounter diagnosis was SVT (supraventricular tachycardia) ([place]). Diagnoses of Muscle strain of chest wall, initial encounter and Atypical chest pain were also pertinent to this visit.  HPI [name] presents for ER follow up .  She was treated for costochondritis  On Sept 20th  When she presented with left upper chest wall pain that radiated to left shoulder .  Pain was aggravated by doing chest compressions on mannequin during a CPR class in the setting of deconditioning and decreased physical activity.   She became concerned because she started having diaphoresis .  She was treated with prednisone and her symptoms transiently improved . She reports that her symptoms also improved with use of a  muscle relaxer but too sedating to take during the day. She is taking motrin 600 mg twice daily  And muscle relaxer.   She remains concerned about her heart.  She reports episodes of Heart racing 3-4 times per week ,  Recorded by her FitBit as high as 130 bpm. Occurring at rest and with normal every day activities,  Lasting  less than 2-3 minutes. Denies feelings of presyncope, but has recurrent unprovoked diaphoresis several times daily,  not occurring during sleep.  The sweats are drenching ,  No cough,  No   Weight loss.  Her FitBit has recorded a rate of 131.    Reviewed Caffeine intake.  Not high.  .   ER notes ,  Labs,  EKG and chest x ray reviewed.     Outpatient Medications Prior to Visit  Medication Sig Dispense Refill  . buPROPion (WELLBUTRIN SR) 100 MG 12 hr tablet Take 1 tablet (100 mg total) by mouth 2 (two) times daily. 180 tablet 1  . cetirizine (ZYRTEC) 10 MG tablet Take 10 mg by mouth daily.      . clonazePAM (KLONOPIN) 1 MG tablet TAKE 1 TABLET BY MOUTH AT BEDTIME 30 tablet 3  . cyclobenzaprine (FLEXERIL) 10 MG tablet Take 1 tablet (10 mg total) by mouth 3  (three) times daily as needed for muscle spasms. 30 tablet 0  . diazepam (VALIUM) 10 MG tablet Take 1 tablet (10 mg total) by mouth daily as needed for anxiety. And muscle spasm 15 tablet 3  . FLUoxetine (PROZAC) 20 MG tablet TAKE 3 TABLETS (60 MG TOTAL) BY MOUTH DAILY. 90 tablet 3  . fluticasone (FLONASE) 50 MCG/ACT nasal spray Place 2 sprays into both nostrils daily. 16 g 2  . ibuprofen (ADVIL,MOTRIN) 800 MG tablet Take 1 tablet (800 mg total) by mouth every 8 (eight) hours as needed. 30 tablet 0  . levonorgestrel (MIRENA) 20 MCG/24HR IUD 1 Intra Uterine Device (1 each total) by Intrauterine route once. 1 each 0  . pramipexole (MIRAPEX) 0.125 MG tablet TAKE 1 TABLET BY MOUTH DAILY AT BEDTIME. INCREASE WEEKLY AS NEEDED 90 tablet 3  . predniSONE (DELTASONE) 10 MG tablet Take 6 tabs (60 mg) PO x 3 days, then take 4 tabs (40 mg) PO x 3 days, then take 2 tabs (20 mg) PO x 3 days, then take 1 tab (10 mg) PO x 3 days, then take 1/2 tab (5 mg) PO x 4 days. 41 tablet 0  . rizatriptan (MAXALT) 10 MG tablet TAKE 1 TABLET (10 MG TOTAL) BY MOUTH AS NEEDED FOR MIGRAINE. MAY REPEAT IN 2 HOURS IF NEEDED 10 tablet 5  . topiramate (TOPAMAX)  100 MG tablet Take 1 tablet (100 mg total) by mouth daily. 90 tablet 3  . Lorcaserin HCl 10 MG TABS Take 1 tablet by mouth daily before breakfast. (Patient not taking: Reported on 07/07/2017) 30 tablet 3   No facility-administered medications prior to visit.     Review of Systems;  Patient denies headache, fevers, malaise, unintentional weight loss, skin rash, eye pain, sinus congestion and sinus pain, sore throat, dysphagia,  hemoptysis , cough, dyspnea, wheezing, chest pain, palpitations, orthopnea, edema, abdominal pain, nausea, melena, diarrhea, constipation, flank pain, dysuria, hematuria, urinary  Frequency, nocturia, numbness, tingling, seizures,  Focal weakness, Loss of consciousness,  Tremor, insomnia, depression, anxiety, and suicidal ideation.      Objective:  BP  120/82 (BP Location: Left Arm, Patient Position: Sitting, Cuff Size: Normal)   Pulse 96   Temp 98.1 F (36.7 C) (Oral)   Resp 16   Ht 5\' 2"  (1.575 m)   Wt 234 lb 12.8 oz (106.5 kg)   SpO2 96%   BMI 42.95 kg/m   BP Readings from Last 3 Encounters:  07/07/17 120/82  06/24/17 120/74  05/17/17 114/82    Wt Readings from Last 3 Encounters:  07/07/17 234 lb 12.8 oz (106.5 kg)  06/24/17 234 lb (106.1 kg)  05/17/17 232 lb 12.8 oz (105.6 kg)    General appearance: alert, cooperative and appears stated age Ears: normal TM's and external ear canals both ears Throat: lips, mucosa, and tongue normal; teeth and gums normal Neck: no adenopathy, no carotid bruit, supple, symmetrical, trachea midline and thyroid not enlarged, symmetric, no tenderness/mass/nodules Back: symmetric, no curvature. ROM normal. No CVA tenderness. Lungs: clear to auscultation bilaterally Heart: regular rate and rhythm, S1, S2 normal, no murmur, click, rub or gallop Abdomen: soft, non-tender; bowel sounds normal; no masses,  no organomegaly Pulses: 2+ and symmetric Skin: Skin color, texture, turgor normal. No rashes or lesions Lymph nodes: Cervical, supraclavicular, and axillary nodes normal.  Lab Results  Component Value Date   HGBA1C 5.8 05/17/2017   HGBA1C 5.5 05/21/2016   HGBA1C 5.8 04/16/2014    Lab Results  Component Value Date   CREATININE 0.85 06/24/2017   CREATININE 0.88 05/17/2017   CREATININE 0.77 08/20/2016    Lab Results  Component Value Date   WBC 9.1 06/24/2017   HGB 13.7 06/24/2017   HCT 39.6 06/24/2017   PLT 313 06/24/2017   GLUCOSE 114 (H) 06/24/2017   CHOL 236 (H) 05/17/2017   TRIG 128.0 05/17/2017   HDL 53.40 05/17/2017   LDLDIRECT 171 (H) 05/21/2016   LDLCALC 157 (H) 05/17/2017   ALT 16 05/17/2017   AST 14 05/17/2017   NA 139 06/24/2017   K 3.2 (L) 06/24/2017   CL 110 06/24/2017   CREATININE 0.85 06/24/2017   BUN 14 06/24/2017   CO2 22 06/24/2017   TSH 1.18 11/30/2016     INR 1.0 07/13/2008   HGBA1C 5.8 05/17/2017   MICROALBUR 0.50 06/17/2012    Dg Chest 2 View  Result Date: 06/24/2017 CLINICAL DATA:  Chest pain for 3 weeks.  Left-sided pain. EXAM: CHEST  2 VIEW COMPARISON:  CT chest 07/14/2008 FINDINGS: The heart size and mediastinal contours are within normal limits. Both lungs are clear. The visualized skeletal structures are unremarkable. IMPRESSION: No active cardiopulmonary disease. Electronically Signed   By: Kathreen Devoid   On: 06/24/2017 16:45    Assessment & Plan:   Problem List Items Addressed This Visit    Atypical chest pain  Her symptoms are reproducible.  ER records reviewed,   her EKG and cardiac enzymes were normal.  Reassurance provided .  Continue treatment  for chest wall muscle strain and add PT referral for exercises and/or TENS unit       SVT (supraventricular tachycardia) ([place]) - Primary    Not captured during recent and current evaluation.  FitBit measured a HR of 131 per patient.  TSH was normal in February and she has had no weight loss or other symptoms to suggest hyperthyroidism.  Consider OHS given body habitus. Referral to cardiology for evaluation with ECHO and holter monitor.       Relevant Orders   Ambulatory referral to Cardiology    Other Visit Diagnoses    Muscle strain of chest wall, initial encounter       Relevant Orders   Ambulatory referral to Physical Therapy     A total of 25 minutes was spent with patient more than half of which was spent in counseling patient on the above mentioned issues , reviewing and explaining recent labs and imaging studies done, and coordination of care.  I am having Ms. Makela maintain her cetirizine, levonorgestrel, ibuprofen, rizatriptan, topiramate, buPROPion, FLUoxetine, pramipexole, clonazePAM, diazepam, Lorcaserin HCl, predniSONE, fluticasone, and cyclobenzaprine.  No orders of the defined types were placed in this encounter.   There are no discontinued  medications.  Follow-up: No Follow-up on file.   [name], MD

## 2017-07-10 DIAGNOSIS — I471 Supraventricular tachycardia: Secondary | ICD-10-CM | POA: Insufficient documentation

## 2017-07-10 NOTE — Assessment & Plan Note (Addendum)
Her symptoms are reproducible.  ER records reviewed,   her EKG and cardiac enzymes were normal.  Reassurance provided .  Continue treatment  for chest wall muscle strain and add PT referral for exercises and/or TENS unit

## 2017-07-10 NOTE — Assessment & Plan Note (Signed)
Not captured during recent and current evaluation.  FitBit measured a HR of 131 per patient.  TSH was normal in February and she has had no weight loss or other symptoms to suggest hyperthyroidism.  Consider OHS given body habitus. Referral to cardiology for evaluation with ECHO and holter monitor.

## 2017-07-14 ENCOUNTER — Other Ambulatory Visit: Payer: Self-pay | Admitting: Internal Medicine

## 2017-07-14 ENCOUNTER — Other Ambulatory Visit: Payer: Self-pay | Admitting: Family Medicine

## 2017-07-14 DIAGNOSIS — G43009 Migraine without aura, not intractable, without status migrainosus: Secondary | ICD-10-CM

## 2017-07-16 ENCOUNTER — Other Ambulatory Visit: Payer: Self-pay | Admitting: Internal Medicine

## 2017-07-16 ENCOUNTER — Encounter: Payer: Self-pay | Admitting: Internal Medicine

## 2017-07-16 MED ORDER — NYSTATIN 100000 UNIT/GM EX POWD: Freq: Two times a day (BID) | CUTANEOUS | 1 refills | Status: DC

## 2017-07-27 ENCOUNTER — Ambulatory Visit: Payer: BC Managed Care – PPO | Attending: Internal Medicine | Admitting: Physical Therapy

## 2017-07-27 ENCOUNTER — Encounter: Payer: Self-pay | Admitting: Physical Therapy

## 2017-07-27 DIAGNOSIS — M6281 Muscle weakness (generalized): Secondary | ICD-10-CM | POA: Diagnosis present

## 2017-07-27 DIAGNOSIS — M62838 Other muscle spasm: Secondary | ICD-10-CM | POA: Diagnosis present

## 2017-07-28 NOTE — Therapy (Signed)
[facility] [address], [place], [address] Phone: [phone]   Fax:  [phone]  Physical Therapy Evaluation  Patient Details  Name: Selena [name] MRN: [id number] Date of Birth: 06/30/1966 Referring Provider: Deborra Medina MD  Encounter Date: 07/27/2017      PT End of Session - 07/27/17 1700    Visit Number 1   Number of Visits 12   Date for PT Re-Evaluation 09/07/17   PT Start Time 3710   PT Stop Time 1615   PT Time Calculation (min) 60 min   Activity Tolerance Patient tolerated treatment well   Behavior During Therapy [facility] for tasks assessed/performed      Past Medical History:  Diagnosis Date  . Anemia, iron deficiency   . Anxiety   . Asthma   . Colon polyps 2014  . Costochondritis   . Depression   . Eating disorder    Binge eating  . Fibromyalgia   . Gallstones   . Hyperlipidemia   . Hypertension     Past Surgical History:  Procedure Laterality Date  . CHOLECYSTECTOMY    . 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 GANGLION CYST EXCISION    . TONSILLECTOMY  1989    There were no vitals filed for this visit.       Subjective Assessment - 07/27/17 1531    Subjective Patient reports tenderness in left side of chest in pectoral region, mostly sore constant since August 2018 following painting and CPR class   Pertinent History August 2018 began having left sided chest pain following painting and CPR and was treated with prednisone NSAIDs with improvement. her chest pain began to worsen following tapering off prednisone. She reports history of episodes of left sided chest pain with increased activity.   Limitations Walking;Lifting;Other (comment)  repetitive motion with left UE   Patient Stated Goals get rid of soreness, strengthen left side and UE to improve functional use left UE   Currently in Pain? Yes   Pain Score 2    Pain Location Chest  pectoral region   Pain  Orientation Left   Pain Descriptors / Indicators Aching;Sore   Pain Type Acute pain;Chronic pain   Pain Onset More than a month ago   Pain Frequency Constant  soreness   Aggravating Factors  using left UE for repetitive tasks such as painting   Pain Relieving Factors rest, medication   Effect of Pain on Daily Activities limits walking, use of left UE            [facility] PT Assessment - 07/27/17 1530      Assessment   Medical Diagnosis muscle strain of chest wall, initial encounter   Referring Provider Deborra Medina MD   Onset Date/Surgical Date 05/05/17   Hand Dominance Right   Prior Therapy none     Precautions   Precautions None     Balance Screen   Has the patient fallen in the past 6 months Yes  tripped over a toy on the floor   How many times? 1   Has the patient had a decrease in activity level because of a fear of falling?  No   Is the patient reluctant to leave their home because of a fear of falling?  No     Prior Function   Level of Independence Independent     Cognition   Overall Cognitive Status Within Functional Limits for tasks assessed  Observation/Other Assessments   Quick DASH  40% (0 = no self perceived disability)     Posture/Postural Control   Posture Comments left shoulder forward as compared to right, forward head posture     ROM / Strength   AROM / PROM / Strength AROM;Strength     AROM   Overall AROM Comments cervical spine flexion 70, extension 50, side bending 40 right, left; UE's shoulders flexion, rotations, abduction all WFL's without reproduction of symptoms     Strength   Overall Strength Comments bilateral UE's shoulder flexion, abduction, ER, IR and extension WFL with left UE decreased ER, abduction as compared to right     Palpation   Palpation comment left pectoral muscles with decreased muscle bulk, + spasms palpable throughout with tenderness elicited     Treatment: Manual therapy: 10 min. Goal: decrease spasms, improve  soft tissue elasticity, pain STM performed to left pectoral muscles with patient supine lying with superficial techniques  Therapeutic exercise: patient performed with instruction, demonstration, verbal and tactile cues of therapist: goal: improve QuickDash, independent with home program, strength Supine hook lying pectoral stretch: x 30 seconds each side Instructed in scapular retraction supine lying and standing at door frame Instructed in cervical retraction supine lying with towel roll under neck, performed 5 reps  patient response to treatment: improved soft tissue elasticity and improved flexibility in pectoral muscle left as noted with stretch. Patient demonstrated improved technique with moderate cuing for correct alignment and technique     Objective measurements completed on examination: See above findings.            PT Education - 07/27/17 1700    Education provided Yes   Education Details HEP; POC: posture correction scapular retraction, pectoral stretch supine with LTR   Person(s) Educated Patient   Methods Explanation;Demonstration;Verbal cues;Handout   Comprehension Verbalized understanding;Returned demonstration;Verbal cues required             PT Long Term Goals - 07/27/17 1700      PT LONG TERM GOAL #1   Title Patient will demonstrate improved function and decreased pain in shoulder as indicated by QuickDash score of <20%   Baseline QuickDash 40%   Status New   Target Date 09/07/17     PT LONG TERM GOAL #2   Title Patient will be independent with home program for pain control, exercises for flexibility and strength to allow transition to self management once discharged from physical therapy   Baseline no knowledge of appropriate pain control strategies and exercise progression    Status New   Target Date 09/07/17                Plan - 07/27/17 1700    Clinical Impression Statement Patient is a 51 year old right hand dominant female who  presents with acute onset of pain, spasms left pectoral muscle following repetitive use with painting, CPR training. She currently has limitaitons with ability to walk, use left UE and QuickDash score of 40% indicating moderate self perceived disability. She has decreased flexibility in left shoulder and pectoral muscles and will benefit from physical therapy intervention to address limitations in order to improve function and return to prior level of function.    History and Personal Factors relevant to plan of care: August 2018 onset of pain and spasms left side of chest. 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, recurrent chest pain with increased activity using left UE over the years   Clinical Presentation Stable  Clinical Presentation due to: improving pain, continues with spasms   Clinical Decision Making Low   Rehab Potential Good   Clinical Impairments Affecting Rehab Potential (+)motivated, age, prior level of function(-)recurrent condition, fibromyalgia   PT Frequency 2x / week   PT Duration 6 weeks   PT Treatment/Interventions Electrical Stimulation;Cryotherapy;Moist Heat;Ultrasound;Patient/family education;Neuromuscular re-education;Therapeutic exercise;Manual techniques;Dry needling   PT Next Visit Plan pain control, therapeutic exercise   PT Home Exercise Plan posture awareness, scapular retraction, cervical retraction   Consulted and Agree with Plan of Care Patient      Patient will benefit from skilled therapeutic intervention in order to improve the following deficits and impairments:  Decreased strength, Pain, Decreased activity tolerance, Impaired perceived functional ability, Impaired UE functional use, Increased muscle spasms  Visit Diagnosis: Other muscle spasm - Plan: PT plan of care cert/re-cert  Muscle weakness (generalized) - Plan: PT plan of care cert/re-cert     Problem List Patient Active Problem List   Diagnosis Date Noted  . SVT (supraventricular tachycardia)  ([place]) 07/10/2017  . Allergic conjunctivitis and rhinitis 10/01/2016  . Atypical chest pain 12/22/2015  . Galactorrhea 09/05/2015  . Restless legs syndrome 04/27/2015  . Insomnia secondary to anxiety 12/22/2014  . IBS (irritable bowel syndrome) 04/21/2014  . Other and unspecified hyperlipidemia 04/21/2014  . Unspecified vitamin D deficiency 04/21/2014  . Encounter for preventive health examination 04/21/2014  . Cervicalgia 07/25/2013  . Morbid obesity ([place]) 07/25/2013  . Generalized anxiety disorder 07/25/2013  . 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 Snoring disorder 03/11/2013  . Fatty liver 08/20/2011  . Allergic rhinitis 04/24/2011  . 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 DEPRESSION/ANXIETY 04/09/2010  . KNEE PAIN, BILATERAL 04/01/2010  . Migraine without aura 05/30/2008  . HYPERTENSION 05/30/2008  . ASTHMA, EXERCISE INDUCED 05/30/2008  . PREMENSTRUAL DYSPHORIC SYNDROME 05/30/2008  . FIBROMYALGIA 05/30/2008    Jomarie Longs PT 07/28/2017, 10:31 PM  [facility] [address] [place], [address] Phone: [phone]   Fax:  [phone]  Name: Selena [name] MRN: [id number] Date of Birth: July 03, 1966

## 2017-08-02 ENCOUNTER — Ambulatory Visit: Payer: BC Managed Care – PPO | Admitting: Physical Therapy

## 2017-08-02 ENCOUNTER — Encounter: Payer: Self-pay | Admitting: Physical Therapy

## 2017-08-02 DIAGNOSIS — M62838 Other muscle spasm: Secondary | ICD-10-CM

## 2017-08-02 DIAGNOSIS — M6281 Muscle weakness (generalized): Secondary | ICD-10-CM

## 2017-08-02 NOTE — Therapy (Signed)
[place] [facility] [facility] [address], [place], [address] Phone: [phone]   Fax:  [phone]  Physical Therapy Treatment  Patient Details  Name: Selena [name] MRN: [id number] Date of Birth: 07/01/1966 Referring Provider: Deborra Medina MD  Encounter Date: 08/02/2017      PT End of Session - 08/02/17 1530    Visit Number 2   Number of Visits 12   Date for PT Re-Evaluation 09/07/17   PT Start Time 1525   PT Stop Time 1557   PT Time Calculation (min) 32 min   Activity Tolerance Patient tolerated treatment well   Behavior During Therapy Selena [name] for tasks assessed/performed      Past Medical History:  Diagnosis Date  . Anemia, iron deficiency   . Anxiety   . Asthma   . Colon polyps 2014  . Costochondritis   . Depression   . Eating disorder    Binge eating  . Fibromyalgia   . Gallstones   . Hyperlipidemia   . Hypertension     Past Surgical History:  Procedure Laterality Date  . CHOLECYSTECTOMY    . 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 GANGLION CYST EXCISION    . TONSILLECTOMY  1989    There were no vitals filed for this visit.      Subjective Assessment - 08/02/17 1527    Subjective Patient reports overall feeling better following previous session and is compliant with home program as instructed. patient reports no pain today.   Pertinent History August 2018 began having left sided chest pain following painting and CPR and was treated with prednisone NSAIDs with improvement. her chest pain began to worsen following tapering off prednisone. She reports history of episodes of left sided chest pain with increased activity.   Limitations Walking;Lifting;Other (comment)  repetitive motion with left UE   Patient Stated Goals get rid of soreness, strengthen left side and UE to improve functional use left UE   Currently in Pain? Other (Comment)  left pectoral muscle with mild tenderness      Objective: Palpation: decreased muscle bulk left pectoral muscles, point tenderness multiple points in muscle belly Strength: decreased stability left shoulder with exercises  Treatment: Manual therapy: 10 min. Goal: decrease spasms, improve soft tissue elasticity, pain STM performed to left pectoral muscles with patient supine lying with superficial techniques  Therapeutic exercise: patient performed with instruction, demonstration, verbal and tactile cues of therapist: goal: improve QuickDash, independent with home program, strength Supine lying : Left and right shoulder circles in air with 2# weight x 10 CW/CCW Chest press bilaterally single arm x 10 reps each 3# weight Bilateral forward flexion overhead holding 3# between hands x 15 reps Standing at wall: pizza carry shoulder ER exercise with stabilization of scapulae, 15 reps  patient response to treatment: Improved soft tissue elasticity in pectoral muscles left by 50% following STM. Improved control of left UE with exercises with repetition and cuing for correct technique and alignment. No pain reported throughout session.             PT Education - 08/02/17 1529    Education provided Yes   Education Details HEP; added supine chest press, circles with stabilization, flexion bilateral overhead, pizza carry with UE's at wall   Person(s) Educated Patient   Methods Explanation;Demonstration;Verbal cues; handout   Comprehension Verbalized understanding;Returned demonstration;Verbal cues required             PT Long Term  Goals - 07/27/17 1700      PT LONG TERM GOAL #1   Title Patient will demonstrate improved function and decreased pain in shoulder as indicated by QuickDash score of <20%   Baseline QuickDash 40%   Status New   Target Date 09/07/17     PT LONG TERM GOAL #2   Title Patient will be independent with home program for pain control, exercises for flexibility and strength to allow transition to self  management once discharged from physical therapy   Baseline no knowledge of appropriate pain control strategies and exercise progression    Status New   Target Date 09/07/17               Plan - 08/02/17 1532    Clinical Impression Statement Patient demonstrates decreasing pain, spasms in left pectoral region and is able to progress exercises with guidance and demonstration and VC. She is compliant with home program and should continue to progress with additional physical therapy intervention.   Rehab Potential Good   Clinical Impairments Affecting Rehab Potential (+)motivated, age, prior level of function(-)recurrent condition, fibromyalgia   PT Frequency 2x / week   PT Duration 6 weeks   PT Treatment/Interventions Electrical Stimulation;Cryotherapy;Moist Heat;Ultrasound;Patient/family education;Neuromuscular re-education;Therapeutic exercise;Manual techniques;Dry needling   PT Next Visit Plan pain control, therapeutic exercise   PT Home Exercise Plan posture awareness, scapular retraction, cervical retraction      Patient will benefit from skilled therapeutic intervention in order to improve the following deficits and impairments:  Decreased strength, Pain, Decreased activity tolerance, Impaired perceived functional ability, Impaired UE functional use, Increased muscle spasms  Visit Diagnosis: Other muscle spasm  Muscle weakness (generalized)     Problem List Patient Active Problem List   Diagnosis Date Noted  . SVT (supraventricular tachycardia) ([place]) 07/10/2017  . Allergic conjunctivitis and rhinitis 10/01/2016  . Atypical chest pain 12/22/2015  . Galactorrhea 09/05/2015  . Restless legs syndrome 04/27/2015  . Insomnia secondary to anxiety 12/22/2014  . IBS (irritable bowel syndrome) 04/21/2014  . Other and unspecified hyperlipidemia 04/21/2014  . Unspecified vitamin D deficiency 04/21/2014  . Encounter for preventive health examination 04/21/2014  . Cervicalgia  07/25/2013  . Morbid obesity ([place]) 07/25/2013  . Generalized anxiety disorder 07/25/2013  . 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 Snoring disorder 03/11/2013  . Fatty liver 08/20/2011  . Allergic rhinitis 04/24/2011  . 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 DEPRESSION/ANXIETY 04/09/2010  . KNEE PAIN, BILATERAL 04/01/2010  . Migraine without aura 05/30/2008  . HYPERTENSION 05/30/2008  . ASTHMA, EXERCISE INDUCED 05/30/2008  . PREMENSTRUAL DYSPHORIC SYNDROME 05/30/2008  . FIBROMYALGIA 05/30/2008    Selena [name] PT 08/02/2017, 4:06 PM  [facility] [address] [place], [address] Phone: [phone]   Fax:  [phone]  Name: Selena [name] MRN: [id number] Date of Birth: 1966/05/13

## 2017-08-04 ENCOUNTER — Ambulatory Visit: Payer: BC Managed Care – PPO | Admitting: Physical Therapy

## 2017-08-04 ENCOUNTER — Encounter: Payer: Self-pay | Admitting: Physical Therapy

## 2017-08-04 DIAGNOSIS — M62838 Other muscle spasm: Secondary | ICD-10-CM

## 2017-08-04 DIAGNOSIS — M6281 Muscle weakness (generalized): Secondary | ICD-10-CM

## 2017-08-04 NOTE — Therapy (Signed)
[facility] [address] [place], [address] Phone: [phone]   Fax:  [phone]  Physical Therapy Treatment  Patient Details  Name: Selena [name] MRN: [id number] Date of Birth: 06-Nov-1965 Referring Provider: Deborra Medina MD  Encounter Date: 08/04/2017      PT End of Session - 08/04/17 1653    Visit Number 3   Number of Visits 12   Date for PT Re-Evaluation 09/07/17   PT Start Time 1648   PT Stop Time 1720   PT Time Calculation (min) 32 min   Activity Tolerance Patient tolerated treatment well   Behavior During Therapy [facility] for tasks assessed/performed      Past Medical History:  Diagnosis Date  . Anemia, iron deficiency   . Anxiety   . Asthma   . Colon polyps 2014  . Costochondritis   . Depression   . Eating disorder    Binge eating  . Fibromyalgia   . Gallstones   . Hyperlipidemia   . Hypertension     Past Surgical History:  Procedure Laterality Date  . CHOLECYSTECTOMY    . 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 GANGLION CYST EXCISION    . TONSILLECTOMY  1989    There were no vitals filed for this visit.      Subjective Assessment - 08/04/17 1650    Subjective No problems from last session.    Pertinent History August 2018 began having left sided chest pain following painting and CPR and was treated with prednisone NSAIDs with improvement. her chest pain began to worsen following tapering off prednisone. She reports history of episodes of left sided chest pain with increased activity.   Limitations Walking;Lifting;Other (comment)  repetitive motion with left UE   Patient Stated Goals get rid of soreness, strengthen left side and UE to improve functional use left UE   Currently in Pain? Other (Comment)  left tenderness pectoral muscle        Objective: Palpation: decreased muscle bulk left pectoral muscles, point tenderness multiple points in muscle belly,  improved from previous session   Treatment: Manual therapy: 10 min. Goal: decrease spasms, improve soft tissue elasticity, pain STM performed to left pectoral muscles with patient supine lying with superficial techniques  Therapeutic exercise: patient performed with instruction, demonstration, verbal and tactile cues of therapist: goal: improve QuickDash, independent with home program, strength Supine lying : Rhythmic stabilization left UE with UE at 90 degrees forward elevation and rotations with arm in neutral/scapular plane 2 x 10 reps each Left and right shoulder circles in air with 2# weight x 10 CW/CCW Chest press bilaterally single arm x 15 reps each 3# weight Bilateral forward flexion overhead holding 5# between hands x 15 reps Standing at wall: pizza carry shoulder ER exercise with stabilization of scapulae using yellow resistive band 15 reps  Patient response to treatment: Improved motor control with good technique. Improved soft tissue elasticity by 50% with significant decreased tenderness at end of session.              PT Education - 08/04/17 1653    Education provided Yes   Education Details re assessed home program   Person(s) Educated Patient   Methods Explanation;Demonstration;Verbal cues   Comprehension Verbalized understanding;Returned demonstration;Verbal cues required             PT Long Term Goals - 07/27/17 1700      PT LONG TERM GOAL #1  Title Patient will demonstrate improved function and decreased pain in shoulder as indicated by QuickDash score of <20%   Baseline QuickDash 40%   Status New   Target Date 09/07/17     PT LONG TERM GOAL #2   Title Patient will be independent with home program for pain control, exercises for flexibility and strength to allow transition to self management once discharged from physical therapy   Baseline no knowledge of appropriate pain control strategies and exercise progression    Status New   Target Date  09/07/17               Plan - 08/04/17 1654    Clinical Impression Statement Patient demonstrates decreasing pain and spasm with current treatment. progressing exercises with moderate guidance   Rehab Potential Good   Clinical Impairments Affecting Rehab Potential (+)motivated, age, prior level of function(-)recurrent condition, fibromyalgia   PT Frequency 2x / week   PT Duration 6 weeks   PT Treatment/Interventions Electrical Stimulation;Cryotherapy;Moist Heat;Ultrasound;Patient/family education;Neuromuscular re-education;Therapeutic exercise;Manual techniques;Dry needling   PT Next Visit Plan pain control, therapeutic exercise   PT Home Exercise Plan posture awareness, scapular retraction, cervical retraction      Patient will benefit from skilled therapeutic intervention in order to improve the following deficits and impairments:  Decreased strength, Pain, Decreased activity tolerance, Impaired perceived functional ability, Impaired UE functional use, Increased muscle spasms  Visit Diagnosis: Other muscle spasm  Muscle weakness (generalized)     Problem List Patient Active Problem List   Diagnosis Date Noted  . SVT (supraventricular tachycardia) ([place]) 07/10/2017  . Allergic conjunctivitis and rhinitis 10/01/2016  . Atypical chest pain 12/22/2015  . Galactorrhea 09/05/2015  . Restless legs syndrome 04/27/2015  . Insomnia secondary to anxiety 12/22/2014  . IBS (irritable bowel syndrome) 04/21/2014  . Other and unspecified hyperlipidemia 04/21/2014  . Unspecified vitamin D deficiency 04/21/2014  . Encounter for preventive health examination 04/21/2014  . Cervicalgia 07/25/2013  . Morbid obesity ([place]) 07/25/2013  . Generalized anxiety disorder 07/25/2013  . 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 Snoring disorder 03/11/2013  . Fatty liver 08/20/2011  . Allergic rhinitis 04/24/2011  . 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 DEPRESSION/ANXIETY 04/09/2010  . KNEE PAIN,  BILATERAL 04/01/2010  . Migraine without aura 05/30/2008  . HYPERTENSION 05/30/2008  . ASTHMA, EXERCISE INDUCED 05/30/2008  . PREMENSTRUAL DYSPHORIC SYNDROME 05/30/2008  . FIBROMYALGIA 05/30/2008    Jomarie Longs PT 08/04/2017, 6:30 PM  [place] [facility] [address], [place], [address] Phone: [phone]   Fax:  [phone]  Name: Selena [name] MRN: [id number] Date of Birth: 1966/04/08

## 2017-08-09 ENCOUNTER — Ambulatory Visit: Payer: BC Managed Care – PPO | Attending: Internal Medicine | Admitting: Physical Therapy

## 2017-08-09 ENCOUNTER — Encounter: Payer: Self-pay | Admitting: Physical Therapy

## 2017-08-09 DIAGNOSIS — M6281 Muscle weakness (generalized): Secondary | ICD-10-CM | POA: Insufficient documentation

## 2017-08-09 DIAGNOSIS — M62838 Other muscle spasm: Secondary | ICD-10-CM | POA: Insufficient documentation

## 2017-08-09 NOTE — Therapy (Signed)
[facility] [address], [place], [address] Phone: [phone]   Fax:  [phone]  Physical Therapy Treatment  Patient Details  Name: Selena [name] MRN: [id number] Date of Birth: 24-Aug-1966 Referring Provider: Deborra Medina MD   Encounter Date: 08/09/2017  PT End of Session - 08/09/17 1615    Visit Number  4    Number of Visits  12    Date for PT Re-Evaluation  09/07/17    PT Start Time  3710    PT Stop Time  1613    PT Time Calculation (min)  35 min    Activity Tolerance  Patient tolerated treatment well    Behavior During Therapy  [facility] for tasks assessed/performed       Past Medical History:  Diagnosis Date  . Anemia, iron deficiency   . Anxiety   . Asthma   . Colon polyps 2014  . Costochondritis   . Depression   . Eating disorder    Binge eating  . Fibromyalgia   . Gallstones   . Hyperlipidemia   . Hypertension     Past Surgical History:  Procedure Laterality Date  . CHOLECYSTECTOMY    . 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 GANGLION CYST EXCISION    . TONSILLECTOMY  1989    There were no vitals filed for this visit.  Subjective Assessment - 08/09/17 1541    Subjective  Patient reports increased soreness and unsure of how to exercise when this occurs. (moist heat and work through 3 reps and if better continue)    Pertinent History  August 2018 began having left sided chest pain following painting and CPR and was treated with prednisone NSAIDs with improvement. her chest pain began to worsen following tapering off prednisone. She reports history of episodes of left sided chest pain with increased activity.    Limitations  Walking;Lifting;Other (comment) repetitive motion with left UE   repetitive motion with left UE   Patient Stated Goals  get rid of soreness, strengthen left side and UE to improve functional use left UE    Currently in Pain?  Yes    Pain Score  2      Pain Location  Chest    Pain Orientation  Left    Pain Descriptors / Indicators  Aching;Sore    Pain Onset  More than a month ago    Pain Frequency  Constant       Objective: Palpation: decreased muscle bulk left pectoral muscles, point tenderness multiple points in muscle belly, improved from previous session   Treatment: Manual therapy: 5 min. Goal: decrease spasms, improve soft tissue elasticity, pain STM performed to left pectoral muscles with patient supine lying with superficial techniques  Therapeutic exercise: patient performed with instruction, demonstration, verbal and tactile cues of therapist: goal: improve QuickDash, independent with home program, strength Supine lying : Rhythmic stabilization left UE with UE at 90 degrees forward elevation and rotations with arm in neutral/scapular plane 3 x 10 reps each  Modalities: Electrical stimulation: Russian stim. 10/10 cycle applied (2) electrodes to left shoulder girdle lower trapezius and rhomboid muscles with patient seated and left UE supported on pillow; goal muscle re education High volt estim.clincial program for muscle spasms  (2) electrodes applied to left shoulder anterior and posterior aspect with intensity to tolerance with patient in seated position with left UE supported on pillow goal: pain, spasms  Patient response to treatment: Patient  reported improved tenderness, less tender and better understanding of exercises following session. No increased pain with modified exercises.     PT Education - 08/09/17 1543    Education provided  Yes    Education Details  re assessed home program, modification to avoid increased soreness     Person(s) Educated  Patient    Methods  Explanation;Demonstration;Verbal cues    Comprehension  Verbalized understanding;Returned demonstration;Verbal cues required          PT Long Term Goals - 07/27/17 1700      PT LONG TERM GOAL #1   Title  Patient will demonstrate  improved function and decreased pain in shoulder as indicated by QuickDash score of <20%    Baseline  QuickDash 40%    Status  New    Target Date  09/07/17      PT LONG TERM GOAL #2   Title  Patient will be independent with home program for pain control, exercises for flexibility and strength to allow transition to self management once discharged from physical therapy    Baseline  no knowledge of appropriate pain control strategies and exercise progression     Status  New    Target Date  09/07/17            Plan - 08/09/17 1620    Clinical Impression Statement  Patient demonstrated improved ability to perform exercises with moderate cuing and guidance as to how to modify exercises and perform with good technique. she is progressing steadily with goals and should continue to progress with additional physical therapy intervention.     Rehab Potential  Good    Clinical Impairments Affecting Rehab Potential  (+)motivated, age, prior level of function(-)recurrent condition, fibromyalgia    PT Frequency  2x / week    PT Duration  6 weeks    PT Treatment/Interventions  Electrical Stimulation;Cryotherapy;Moist Heat;Ultrasound;Patient/family education;Neuromuscular re-education;Therapeutic exercise;Manual techniques;Dry needling    PT Next Visit Plan  pain control, therapeutic exercise    PT Home Exercise Plan  posture awareness, scapular retraction, cervical retraction       Patient will benefit from skilled therapeutic intervention in order to improve the following deficits and impairments:  Decreased strength, Pain, Decreased activity tolerance, Impaired perceived functional ability, Impaired UE functional use, Increased muscle spasms  Visit Diagnosis: Other muscle spasm  Muscle weakness (generalized)     Problem List Patient Active Problem List   Diagnosis Date Noted  . SVT (supraventricular tachycardia) ([place]) 07/10/2017  . Allergic conjunctivitis and rhinitis 10/01/2016  .  Atypical chest pain 12/22/2015  . Galactorrhea 09/05/2015  . Restless legs syndrome 04/27/2015  . Insomnia secondary to anxiety 12/22/2014  . IBS (irritable bowel syndrome) 04/21/2014  . Other and unspecified hyperlipidemia 04/21/2014  . Unspecified vitamin D deficiency 04/21/2014  . Encounter for preventive health examination 04/21/2014  . Cervicalgia 07/25/2013  . Morbid obesity ([place]) 07/25/2013  . Generalized anxiety disorder 07/25/2013  . 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 Snoring disorder 03/11/2013  . Fatty liver 08/20/2011  . Allergic rhinitis 04/24/2011  . 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 DEPRESSION/ANXIETY 04/09/2010  . KNEE PAIN, BILATERAL 04/01/2010  . Migraine without aura 05/30/2008  . HYPERTENSION 05/30/2008  . ASTHMA, EXERCISE INDUCED 05/30/2008  . PREMENSTRUAL DYSPHORIC SYNDROME 05/30/2008  . FIBROMYALGIA 05/30/2008    Jomarie Longs PT 08/10/2017, 5:12 PM  [facility] [address], [place], [address] Phone: [phone]   Fax:  [phone]  Name:  Selena [name] MRN: [id number] Date of Birth: 27-May-1966

## 2017-08-10 ENCOUNTER — Other Ambulatory Visit: Payer: Self-pay | Admitting: Internal Medicine

## 2017-08-11 ENCOUNTER — Encounter: Payer: Self-pay | Admitting: Physical Therapy

## 2017-08-11 ENCOUNTER — Ambulatory Visit: Payer: BC Managed Care – PPO | Admitting: Physical Therapy

## 2017-08-11 DIAGNOSIS — M62838 Other muscle spasm: Secondary | ICD-10-CM | POA: Diagnosis not present

## 2017-08-11 DIAGNOSIS — M6281 Muscle weakness (generalized): Secondary | ICD-10-CM

## 2017-08-11 NOTE — Therapy (Signed)
[facility] [address] [place], [address] Phone: [phone]   Fax:  [phone]  Physical Therapy Treatment  Patient Details  Name: Selena [name] MRN: [id number] Date of Birth: 08-09-1966 Referring Provider: Deborra Medina MD   Encounter Date: 08/11/2017  PT End of Session - 08/11/17 1552    Visit Number  5    Number of Visits  12    Date for PT Re-Evaluation  09/07/17    PT Start Time  1324    PT Stop Time  1600    PT Time Calculation (min)  45 min    Activity Tolerance  Patient tolerated treatment well    Behavior During Therapy  [facility] for tasks assessed/performed       Past Medical History:  Diagnosis Date  . Anemia, iron deficiency   . Anxiety   . Asthma   . Colon polyps 2014  . Costochondritis   . Depression   . Eating disorder    Binge eating  . Fibromyalgia   . Gallstones   . Hyperlipidemia   . Hypertension     Past Surgical History:  Procedure Laterality Date  . CHOLECYSTECTOMY    . 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 GANGLION CYST EXCISION    . TONSILLECTOMY  1989    There were no vitals filed for this visit.  Subjective Assessment - 08/11/17 1526    Subjective  Patient reports no pain in her left shoulder /chest area today    Pertinent History  August 2018 began having left sided chest pain following painting and CPR and was treated with prednisone NSAIDs with improvement. her chest pain began to worsen following tapering off prednisone. She reports history of episodes of left sided chest pain with increased activity.    Limitations  Walking;Lifting;Other (comment) repetitive motion with left UE   repetitive motion with left UE   Patient Stated Goals  get rid of soreness, strengthen left side and UE to improve functional use left UE    Pain Onset  --         Objective: Palpation: decreased muscle bulk left pectoral muscles, point tenderness multiple  points in muscle belly, improved from previous session   Treatment:  Therapeutic exercise: patient performed with instruction, demonstration, verbal and tactile cues of therapist: goal: improve QuickDash, independent with home program, strength Supine lying : Rhythmic stabilizationleft UE with UE at 90 degrees forward elevation and rotations with arm in neutral/scapular plane 3 x 10 reps each Chest press with 3# weights bilateral 2 x 15 1# weights for circles CW/CCW 2 sets 10 reps Side lying right;  Left shoulder scapular control exercises: protraction, retraction with tactile cues x 10 Standing scapular retraction with resistive band x 10  Modalities: Electrical stimulation: Russian stim. 10/10 cycle applied (2) electrodes to left shoulder girdle lower trapezius and rhomboid muscles with patient seated and left UE supported on pillow; goal muscle re education High volt estim.clincial program for muscle spasms  (2) electrodes applied to left shoulder anterior and posterior aspect with intensity to tolerance with patient in seated position with left UE supported on pillow goal: pain, spasms Moist heat applied to same for pain control; no adverse reaction noted  Patient response to treatment:Patient demonstrated improved technique with exercises with minimal cuing and demonstration. No increased pain with exercises. Improved scapular retraction with estim. And decreased tenderness left shoulder following estim.    PT Education - 08/11/17  57    Education provided  Yes    Education Details  re assessed exercises     Person(s) Educated  Patient    Methods  Explanation;Demonstration;Verbal cues    Comprehension  Verbalized understanding;Returned demonstration;Verbal cues required          PT Long Term Goals - 07/27/17 1700      PT LONG TERM GOAL #1   Title  Patient will demonstrate improved function and decreased pain in shoulder as indicated by QuickDash score of <20%     Baseline  QuickDash 40%    Status  New    Target Date  09/07/17      PT LONG TERM GOAL #2   Title  Patient will be independent with home program for pain control, exercises for flexibility and strength to allow transition to self management once discharged from physical therapy    Baseline  no knowledge of appropriate pain control strategies and exercise progression     Status  New    Target Date  09/07/17            Plan - 08/11/17 1552    Clinical Impression Statement  Patient is progressing steadily with exercises and goals for strength and function. she is improving knowledge of appropriate exercise progression and modification and should continue to improve with additional physical therapy intervention.     Rehab Potential  Good    Clinical Impairments Affecting Rehab Potential  (+)motivated, age, prior level of function(-)recurrent condition, fibromyalgia    PT Frequency  2x / week    PT Duration  6 weeks    PT Treatment/Interventions  Electrical Stimulation;Cryotherapy;Moist Heat;Ultrasound;Patient/family education;Neuromuscular re-education;Therapeutic exercise;Manual techniques;Dry needling    PT Next Visit Plan  pain control, therapeutic exercise    PT Home Exercise Plan  posture awareness, scapular retraction, cervical retraction, shoulder chest press, ER with supinated forearms, ER with neutral position        Patient will benefit from skilled therapeutic intervention in order to improve the following deficits and impairments:  Decreased strength, Pain, Decreased activity tolerance, Impaired perceived functional ability, Impaired UE functional use, Increased muscle spasms  Visit Diagnosis: Other muscle spasm  Muscle weakness (generalized)     Problem List Patient Active Problem List   Diagnosis Date Noted  . SVT (supraventricular tachycardia) ([place]) 07/10/2017  . Allergic conjunctivitis and rhinitis 10/01/2016  . Atypical chest pain 12/22/2015  . Galactorrhea  09/05/2015  . Restless legs syndrome 04/27/2015  . Insomnia secondary to anxiety 12/22/2014  . IBS (irritable bowel syndrome) 04/21/2014  . Other and unspecified hyperlipidemia 04/21/2014  . Unspecified vitamin D deficiency 04/21/2014  . Encounter for preventive health examination 04/21/2014  . Cervicalgia 07/25/2013  . Morbid obesity ([place]) 07/25/2013  . Generalized anxiety disorder 07/25/2013  . 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 Snoring disorder 03/11/2013  . Fatty liver 08/20/2011  . Allergic rhinitis 04/24/2011  . 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 DEPRESSION/ANXIETY 04/09/2010  . KNEE PAIN, BILATERAL 04/01/2010  . Migraine without aura 05/30/2008  . HYPERTENSION 05/30/2008  . ASTHMA, EXERCISE INDUCED 05/30/2008  . PREMENSTRUAL DYSPHORIC SYNDROME 05/30/2008  . FIBROMYALGIA 05/30/2008    Jomarie Longs PT 08/11/2017, 6:28 PM  [facility] [address] [place], [address] Phone: [phone]   Fax:  [phone]  Name: Julianna Vanwagner MRN: [id number] Date of Birth: August 05, 1966

## 2017-08-13 ENCOUNTER — Telehealth: Payer: Self-pay

## 2017-08-13 NOTE — Telephone Encounter (Signed)
Can you please reschedule patient.

## 2017-08-13 NOTE — Telephone Encounter (Signed)
Copied from [place] [id number]. Topic: Quick Communication - See Telephone Encounter >> Aug 13, 2017 10:51 AM Ether Griffins B wrote: CRM for notification. See Telephone encounter for:  Pt was just seen by tullo and had a follow up scheduled for 11/14 pt needed to cancel due to mother having surgery that day and wasn't sure since she was just  seen by tullo if she still needs the follow up appt.  08/13/17.

## 2017-08-16 ENCOUNTER — Ambulatory Visit: Payer: BC Managed Care – PPO | Admitting: Physical Therapy

## 2017-08-16 ENCOUNTER — Encounter: Payer: Self-pay | Admitting: Physical Therapy

## 2017-08-16 ENCOUNTER — Other Ambulatory Visit: Payer: Self-pay

## 2017-08-16 DIAGNOSIS — M6281 Muscle weakness (generalized): Secondary | ICD-10-CM

## 2017-08-16 DIAGNOSIS — M62838 Other muscle spasm: Secondary | ICD-10-CM | POA: Diagnosis not present

## 2017-08-16 NOTE — Therapy (Signed)
[facility] [address] [place], [address] Phone: [phone]   Fax:  [phone]  Physical Therapy Treatment  Patient Details  Name: Selena [name] MRN: [id number] Date of Birth: Dec 04, 1965 Referring Provider: Deborra Medina MD   Encounter Date: 08/16/2017  PT End of Session - 08/16/17 1609    Visit Number  6    Number of Visits  12    Date for PT Re-Evaluation  09/07/17    PT Start Time  7858    PT Stop Time  1607    PT Time Calculation (min)  50 min    Activity Tolerance  Patient tolerated treatment well    Behavior During Therapy  [facility] for tasks assessed/performed       Past Medical History:  Diagnosis Date  . Anemia, iron deficiency   . Anxiety   . Asthma   . Colon polyps 2014  . Costochondritis   . Depression   . Eating disorder    Binge eating  . Fibromyalgia   . Gallstones   . Hyperlipidemia   . Hypertension     Past Surgical History:  Procedure Laterality Date  . CHOLECYSTECTOMY    . 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 GANGLION CYST EXCISION    . TONSILLECTOMY  1989    There were no vitals filed for this visit.  Subjective Assessment - 08/16/17 1522    Subjective  Patient reports doing well with left shoulder, no concerns today. Patient reports she was a little sore following previous session.    Pertinent History  August 2018 began having left sided chest pain following painting and CPR and was treated with prednisone NSAIDs with improvement. her chest pain began to worsen following tapering off prednisone. She reports history of episodes of left sided chest pain with increased activity.    Limitations  Walking;Lifting;Other (comment) repetitive motion with left UE    Patient Stated Goals  get rid of soreness, strengthen left side and UE to improve functional use left UE    Currently in Pain?  No/denies       Objective: Palpation: decreased muscle bulk  left pectoral muscles, point tenderness multiple points in muscle belly, improved from previous session Posture: mild winging left scapula, rounded shoulder   Treatment: Therapeutic exercise: patient performed with instruction, demonstration, verbal and tactile cues of therapist: goal: improve QuickDash, independent with home program, strength Supine lying : Rhythmic stabilizationleft UE with UE at 90 degrees forward elevation and rotations with arm in neutral/scapular plane3x 10 reps each 2# weights for circles CW/CCW 2 sets 10 reps OMEGA  Standing straight arm pull downs 15# x 15 Seated scapular rows 15# bilateral x 15, single 5# x 15 each Seated chest press x 15 short arc with 5#, then  through short arc with more controlled slower motion x 15 reps with 10#  Manual therapy: 8 min. Goal:muscle spasm  STM 8 min.superficial techniques with patient supine lying left pectoral muscles   Modalities: Electrical stimulation: 15 min. Russian stim. 10/10 cycle applied (2) electrodes toleft shoulder girdle lower trapezius and rhomboid muscleswith patient seated and left UEsupported on pillow; goal muscle re education High volt estim.clincial program for muscle spasms (2) electrodes applied to left shoulder anterior and posterior aspect withintensity to tolerance with patient in seatedposition with left UE supported on pillow goal: pain, spasms Moist heat applied to same for pain control; no adverse reaction noted  Patient response to  treatment: Patient demonstrated improved technique and motor control with exercises with moderate VC and demonstration, repetition. No increased pain with exercises. Improved scapular retraction with estim. Decreased soreness reported in left shoulder following estim.       PT Education - 08/16/17 1615    Education provided  Yes    Education Details  exercise instruction for strengthening    Person(s) Educated  Patient    Methods   Explanation;Demonstration;Verbal cues    Comprehension  Verbalized understanding;Returned demonstration;Verbal cues required          PT Long Term Goals - 07/27/17 1700      PT LONG TERM GOAL #1   Title  Patient will demonstrate improved function and decreased pain in shoulder as indicated by QuickDash score of <20%    Baseline  QuickDash 40%    Status  New    Target Date  09/07/17      PT LONG TERM GOAL #2   Title  Patient will be independent with home program for pain control, exercises for flexibility and strength to allow transition to self management once discharged from physical therapy    Baseline  no knowledge of appropriate pain control strategies and exercise progression     Status  New    Target Date  09/07/17            Plan - 08/16/17 1609    Clinical Impression Statement  Patient continues to demonstrate improvement with strength and endurance with progressive strenghtening exercises and minimal cuing and demonstration for correct technique.    Rehab Potential  Good    Clinical Impairments Affecting Rehab Potential  (+)motivated, age, prior level of function(-)recurrent condition, fibromyalgia    PT Frequency  2x / week    PT Duration  6 weeks    PT Treatment/Interventions  Electrical Stimulation;Cryotherapy;Moist Heat;Ultrasound;Patient/family education;Neuromuscular re-education;Therapeutic exercise;Manual techniques;Dry needling    PT Next Visit Plan  pain control, therapeutic exercise    PT Home Exercise Plan  posture awareness, scapular retraction, cervical retraction, shoulder chest press, ER with supinated forearms, ER with neutral position        Patient will benefit from skilled therapeutic intervention in order to improve the following deficits and impairments:  Decreased strength, Pain, Decreased activity tolerance, Impaired perceived functional ability, Impaired UE functional use, Increased muscle spasms  Visit Diagnosis: Other muscle  spasm  Muscle weakness (generalized)     Problem List Patient Active Problem List   Diagnosis Date Noted  . SVT (supraventricular tachycardia) ([place]) 07/10/2017  . Allergic conjunctivitis and rhinitis 10/01/2016  . Atypical chest pain 12/22/2015  . Galactorrhea 09/05/2015  . Restless legs syndrome 04/27/2015  . Insomnia secondary to anxiety 12/22/2014  . IBS (irritable bowel syndrome) 04/21/2014  . Other and unspecified hyperlipidemia 04/21/2014  . Unspecified vitamin D deficiency 04/21/2014  . Encounter for preventive health examination 04/21/2014  . Cervicalgia 07/25/2013  . Morbid obesity ([place]) 07/25/2013  . Generalized anxiety disorder 07/25/2013  . 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 Snoring disorder 03/11/2013  . Fatty liver 08/20/2011  . Allergic rhinitis 04/24/2011  . 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 DEPRESSION/ANXIETY 04/09/2010  . KNEE PAIN, BILATERAL 04/01/2010  . Migraine without aura 05/30/2008  . HYPERTENSION 05/30/2008  . ASTHMA, EXERCISE INDUCED 05/30/2008  . PREMENSTRUAL DYSPHORIC SYNDROME 05/30/2008  . FIBROMYALGIA 05/30/2008    Jomarie Longs PT 08/17/2017, 6:20 PM   [facility] [address], [place], [address] Phone: [phone]  Fax:  [phone]  Name: Selena [name] MRN: [id number] Date of Birth: 03-31-1966

## 2017-08-18 ENCOUNTER — Ambulatory Visit: Payer: BC Managed Care – PPO | Admitting: Internal Medicine

## 2017-08-21 ENCOUNTER — Encounter: Payer: Self-pay | Admitting: Internal Medicine

## 2017-08-23 ENCOUNTER — Encounter: Payer: BC Managed Care – PPO | Admitting: Physical Therapy

## 2017-08-23 NOTE — Addendum Note (Signed)
Addended by: [name] on: 08/23/2017 05:45 PM   Modules accepted: Orders

## 2017-08-30 ENCOUNTER — Ambulatory Visit: Payer: BC Managed Care – PPO | Admitting: Physical Therapy

## 2017-08-30 ENCOUNTER — Encounter: Payer: Self-pay | Admitting: Physical Therapy

## 2017-08-30 DIAGNOSIS — M62838 Other muscle spasm: Secondary | ICD-10-CM

## 2017-08-30 DIAGNOSIS — M6281 Muscle weakness (generalized): Secondary | ICD-10-CM

## 2017-08-30 NOTE — Therapy (Signed)
[facility] [address], [place], [address] Phone: [phone]   Fax:  [phone]  Physical Therapy Treatment  Patient Details  Name: Selena [name] MRN: [id number] Date of Birth: [birth year] Referring Provider: Deborra Medina MD   Encounter Date: 08/30/2017  PT End of Session - 08/30/17 1756    Visit Number  7    Number of Visits  12    Date for PT Re-Evaluation  09/07/17    PT Start Time  9983    PT Stop Time  1821    PT Time Calculation (min)  30 min    Activity Tolerance  Patient tolerated treatment well    Behavior During Therapy  [facility] for tasks assessed/performed       Past Medical History:  Diagnosis Date  . Anemia, iron deficiency   . Anxiety   . Asthma   . Colon polyps 2014  . Costochondritis   . Depression   . Eating disorder    Binge eating  . Fibromyalgia   . Gallstones   . Hyperlipidemia   . Hypertension     Past Surgical History:  Procedure Laterality Date  . CHOLECYSTECTOMY    . 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 GANGLION CYST EXCISION    . TONSILLECTOMY  1989    There were no vitals filed for this visit.  Subjective Assessment - 08/30/17 1755    Subjective  Patient reports she has been more active and has not exacerbated pain or symptoms in left shoulder/ chest.    Pertinent History  August 2018 began having left sided chest pain following painting and CPR and was treated with prednisone NSAIDs with improvement. her chest pain began to worsen following tapering off prednisone. She reports history of episodes of left sided chest pain with increased activity.    Limitations  Walking;Lifting;Other (comment) repetitive motion with left UE    Patient Stated Goals  get rid of soreness, strengthen left side and UE to improve functional use left UE    Currently in Pain?  No/denies       Objective: Palpation: decreased muscle bulk left pectoral muscles, point  tenderness multiple points in muscle belly, improved from previous session Posture: mild winging left scapula, rounded shoulder   Treatment:  Manual therapy: 8 min. Goal:muscle spasm  STM superficial techniques: patient supine lying performed to left pectoral muscles  Therapeutic exercise: patient performed with instruction, demonstration, verbal and tactile cues of therapist: goal: improve QuickDash, independent with home program, strength Supine lying : Rhythmic stabilizationleft UE with UE at 90 degrees forward elevation and rotations with arm in neutral/scapular plane3x 10 reps each 2# weight (dumbbell) for circles CW/CCW 1 set 10 reps Side lying right: left UE ER with 2# weight x 15 reps  OMEGA  Standing straight arm pull downs 15# x 15 Standing bilateral scapular rows 15# x 15 reps Singe arm rows standing 10# each UE x 10-15 reps with tactile cues, VC for correct alignment, technique to keep elbow close to trunk, focus on scapula control Seated chest press 2 x 15 short arc with 10#  Patient response to treatment: Patient demonstrated improved technique and motor control, scapula stability with exercises with verbal, tactile cues and repetition. No increased pain with exercises.     PT Education - 08/30/17 1756    Education provided  Yes    Education Details  exercise instruction for strengthening    Person(s) Educated  Patient    Methods  Explanation;Demonstration;Verbal cues    Comprehension  Returned demonstration;Verbal cues required;Verbalized understanding          PT Long Term Goals - 07/27/17 1700      PT LONG TERM GOAL #1   Title  Patient will demonstrate improved function and decreased pain in shoulder as indicated by QuickDash score of <20%    Baseline  QuickDash 40%    Status  New    Target Date  09/07/17      PT LONG TERM GOAL #2   Title  Patient will be independent with home program for pain control, exercises for flexibility and strength to  allow transition to self management once discharged from physical therapy    Baseline  no knowledge of appropriate pain control strategies and exercise progression     Status  New    Target Date  09/07/17            Plan - 08/30/17 1757    Clinical Impression Statement  Patient demonstrates good progress towards goals with no exacerbation of symptoms since previous visit 2 weeks ago. She continues with spasms and weakness left UE/ shoulder and will benefit from additional physical therapy intervention in order to transition to independent home program.   Rehab Potential  Good    Clinical Impairments Affecting Rehab Potential  (+)motivated, age, prior level of function(-)recurrent condition, fibromyalgia    PT Frequency  2x / week    PT Duration  6 weeks    PT Treatment/Interventions  Electrical Stimulation;Cryotherapy;Moist Heat;Ultrasound;Patient/family education;Neuromuscular re-education;Therapeutic exercise;Manual techniques;Dry needling    PT Next Visit Plan  pain control, therapeutic exercise    PT Home Exercise Plan  posture awareness, scapular retraction, cervical retraction, shoulder chest press, ER with supinated forearms, ER with neutral position        Patient will benefit from skilled therapeutic intervention in order to improve the following deficits and impairments:  Decreased strength, Pain, Decreased activity tolerance, Impaired perceived functional ability, Impaired UE functional use, Increased muscle spasms  Visit Diagnosis: Muscle weakness (generalized)  Other muscle spasm     Problem List Patient Active Problem List   Diagnosis Date Noted  . SVT (supraventricular tachycardia) ([facility]) 07/10/2017  . Allergic conjunctivitis and rhinitis 10/01/2016  . Atypical chest pain 12/22/2015  . Galactorrhea 09/05/2015  . Restless legs syndrome 04/27/2015  . Insomnia secondary to anxiety 12/22/2014  . IBS (irritable bowel syndrome) 04/21/2014  . Other and unspecified  hyperlipidemia 04/21/2014  . Unspecified vitamin D deficiency 04/21/2014  . Encounter for preventive health examination 04/21/2014  . Cervicalgia 07/25/2013  . Morbid obesity ([facility]) 07/25/2013  . Generalized anxiety disorder 07/25/2013  . 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 Snoring disorder 03/11/2013  . Fatty liver 08/20/2011  . Allergic rhinitis 04/24/2011  . 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 DEPRESSION/ANXIETY 04/09/2010  . KNEE PAIN, BILATERAL 04/01/2010  . Migraine without aura 05/30/2008  . HYPERTENSION 05/30/2008  . ASTHMA, EXERCISE INDUCED 05/30/2008  . PREMENSTRUAL DYSPHORIC SYNDROME 05/30/2008  . FIBROMYALGIA 05/30/2008    Jomarie Longs PT 08/31/2017, 10:16 PM  [facility] [address] [place], [address] Phone: [phone]   Fax:  [phone]  Name: Selena [name] MRN: [id number] Date of Birth: 1966-01-25

## 2017-08-31 ENCOUNTER — Telehealth: Payer: Self-pay

## 2017-08-31 NOTE — Telephone Encounter (Signed)
PA for [employer]vi[employer] has been submitted on covermymeds.

## 2017-09-01 ENCOUNTER — Ambulatory Visit: Payer: BC Managed Care – PPO | Admitting: Physical Therapy

## 2017-09-01 ENCOUNTER — Other Ambulatory Visit: Payer: Self-pay

## 2017-09-01 ENCOUNTER — Encounter: Payer: Self-pay | Admitting: Physical Therapy

## 2017-09-01 DIAGNOSIS — M62838 Other muscle spasm: Secondary | ICD-10-CM | POA: Diagnosis not present

## 2017-09-01 DIAGNOSIS — M6281 Muscle weakness (generalized): Secondary | ICD-10-CM

## 2017-09-01 NOTE — Therapy (Addendum)
[facility] [address], [place], [address] Phone: [phone]   Fax:  [phone]  Physical Therapy Treatment  Patient Details  Name: Selena [name] MRN: [id number] Date of Birth: Dec 24, 1965 Referring Provider: Deborra Medina MD   Encounter Date: 09/01/2017  PT End of Session - 09/01/17 1526     Visit Number  8    Number of Visits  12    Date for PT Re-Evaluation  09/07/17    PT Start Time  2620    PT Stop Time  1559    PT Time Calculation (min)  33 min    Activity Tolerance  Patient tolerated treatment well    Behavior During Therapy  [facility] for tasks assessed/performed        Past Medical History:  Diagnosis Date   Anemia, iron deficiency    Anxiety    Asthma    Colon polyps 2014   Costochondritis    Depression    Eating disorder    Binge eating   Fibromyalgia    Gallstones    Hyperlipidemia    Hypertension     Past Surgical History:  Procedure Laterality Date   CHOLECYSTECTOMY     EXPLORATORY LAPAROTOMY  2000   for infertility   GANGLION CYST EXCISION     TONSILLECTOMY  1989    There were no vitals filed for this visit.  Subjective Assessment - 09/01/17 1526     Subjective  Pt arrived late, limiting session. Pt reports she was sore after last session but is feeling well today.  Pt has been completing her HEP 2x/wk without questions or concerns.      Pertinent History  August 2018 began having left sided chest pain following painting and CPR and was treated with prednisone NSAIDs with improvement. her chest pain began to worsen following tapering off prednisone. She reports history of episodes of left sided chest pain with increased activity.    Limitations  Walking;Lifting;Other (comment) repetitive motion with left UE    Patient Stated Goals  get rid of soreness, strengthen left side and UE to improve functional use left UE    Currently in Pain?  Yes    Pain Score  2     Pain  Location  -- L pec    Pain Orientation  Left    Pain Descriptors / Indicators  Sore        TREATMENT   Manual therapy:  STM superficial techniques:?patient supine lying performed to?left pectoral muscles     Therapeutic exercise: patient performed with instruction, demonstration, verbal and tactile cues of therapist: goal: improve QuickDash, independent with home program, strength  Supine:  Rhythmic stabilization with 2# left UE with UE at 90 degrees forward elevation and rotations with arm in neutral/scapular plane?3?x 20 seconds each  3# weight?(dumbbell)?for circles CW/CCW 1?set 10 reps  Side lying right: left UE ER with 2# weight x 15 reps  Gentle doorway pec stretch x30 seconds each side  Serratus punch onto ball on wall and moving in circles CW/CCW 1 set 10 reps each direction  Seated serratus punches with RTB 2x15  OMEGA  Standing LUE arm pull downs 15# x 15  Standing bilateral scapular rows 15# x 15 reps, repeated x15 with 20#       PT Education - 09/01/17 1525     Education provided  Yes    Education Details  Exercise technique    [employer]) Educated  Patient  Methods  Explanation;Demonstration;Verbal cues    Comprehension  Verbalized understanding;Returned demonstration;Verbal cues required;Need further instruction           PT Long Term Goals - 07/27/17 1700       PT LONG TERM GOAL #1   Title  Patient will demonstrate improved function and decreased pain in shoulder as indicated by QuickDash score of <20%    Baseline  QuickDash 40%    Status  New    Target Date  09/07/17      PT LONG TERM GOAL #2   Title  Patient will be independent with home program for pain control, exercises for flexibility and strength to allow transition to self management once discharged from physical therapy    Baseline  no knowledge of appropriate pain control strategies and exercise progression     Status  New    Target Date  09/07/17             Plan - 09/01/17  1547     Clinical Impression Statement  Pt reports overall improvement in symptoms since starting therapy.  She responded well to interventions this session with progression of strengthening exercises.  She will benefit from continued skilled PT interventions for decreased pain and soreness and improved QOL.     Rehab Potential  Good    Clinical Impairments Affecting Rehab Potential  (+)motivated, age, prior level of function(-)recurrent condition, fibromyalgia    PT Frequency  2x / week    PT Duration  6 weeks    PT Treatment/Interventions  Electrical Stimulation;Cryotherapy;Moist Heat;Ultrasound;Patient/family education;Neuromuscular re-education;Therapeutic exercise;Manual techniques;Dry needling    PT Next Visit Plan  pain control, therapeutic exercise    PT Home Exercise Plan  posture awareness, scapular retraction, cervical retraction, shoulder chest press, ER with supinated forearms, ER with neutral position         Patient will benefit from skilled therapeutic intervention in order to improve the following deficits and impairments:  Decreased strength, Pain, Decreased activity tolerance, Impaired perceived functional ability, Impaired UE functional use, Increased muscle spasms  Visit Diagnosis: Muscle weakness (generalized)  Other muscle spasm     Problem List Patient Active Problem List   Diagnosis Date Noted   SVT (supraventricular tachycardia) ([facility]) 07/10/2017   Allergic conjunctivitis and rhinitis 10/01/2016   Atypical chest pain 12/22/2015   Galactorrhea 09/05/2015   Restless legs syndrome 04/27/2015   Insomnia secondary to anxiety 12/22/2014   IBS (irritable bowel syndrome) 04/21/2014   Other and unspecified hyperlipidemia 04/21/2014   Unspecified vitamin D deficiency 04/21/2014   Encounter for preventive health examination 04/21/2014   Cervicalgia 07/25/2013   Morbid obesity ([facility]) 07/25/2013   Generalized anxiety disorder 07/25/2013   Bruxism, sleep-related  04/08/2013   Snoring disorder 03/11/2013   Fatty liver 08/20/2011   Allergic rhinitis 04/24/2011   PITUITARY ADENOMA, 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   DEPRESSION/ANXIETY 04/09/2010   KNEE PAIN, BILATERAL 04/01/2010   Migraine without aura 05/30/2008   HYPERTENSION 05/30/2008   ASTHMA, EXERCISE INDUCED 05/30/2008   PREMENSTRUAL DYSPHORIC SYNDROME 05/30/2008   FIBROMYALGIA 05/30/2008    Collie Siad PT, DPT 09/01/2017, 3:56 PM  [facility] [address], [place], [address] Phone: [phone]   Fax:  [phone]  Name: Selena [name] MRN: [id number] Date of Birth: September 06, 1966    PHYSICAL THERAPY DISCHARGE SUMMARY  Visits from Start of Care: 8  Plan: Patient agrees to discharge.  Patient goals were  partially met. Patient is being discharged due  to - not returning since last visit.    Lyndee Hensen, PT, DPT 8:36 AM  07/07/22

## 2017-09-07 ENCOUNTER — Ambulatory Visit: Payer: BC Managed Care – PPO | Admitting: Physical Therapy

## 2017-09-10 ENCOUNTER — Other Ambulatory Visit (HOSPITAL_COMMUNITY): Payer: Self-pay | Admitting: Surgery

## 2017-09-13 ENCOUNTER — Ambulatory Visit: Payer: BC Managed Care – PPO | Admitting: Physical Therapy

## 2017-09-14 ENCOUNTER — Ambulatory Visit: Payer: BC Managed Care – PPO | Admitting: Cardiovascular Disease

## 2017-09-14 ENCOUNTER — Other Ambulatory Visit: Payer: Self-pay | Admitting: Internal Medicine

## 2017-09-15 ENCOUNTER — Other Ambulatory Visit: Payer: Self-pay | Admitting: Surgery

## 2017-09-17 ENCOUNTER — Encounter: Payer: Self-pay | Admitting: Internal Medicine

## 2017-09-20 ENCOUNTER — Encounter: Payer: Self-pay | Admitting: Internal Medicine

## 2017-09-20 ENCOUNTER — Ambulatory Visit: Payer: BC Managed Care – PPO | Admitting: Internal Medicine

## 2017-09-20 VITALS — BP 102/66 | HR 92 | Temp 97.9°F | Resp 15 | Ht 62.0 in | Wt 237.8 lb

## 2017-09-20 DIAGNOSIS — K5901 Slow transit constipation: Secondary | ICD-10-CM | POA: Diagnosis not present

## 2017-09-20 MED ORDER — LACTULOSE 10 GM/15ML PO SOLN: 20.0000 g | Freq: Three times a day (TID) | ORAL | 0 refills | Status: DC

## 2017-09-20 MED ORDER — PEG 3350-KCL-NA BICARB-NACL 420 G PO SOLR: ORAL | 0 refills | Status: DC

## 2017-09-20 NOTE — Progress Notes (Signed)
Subjective:  Patient ID: Selena [name], female    DOB: 1965-12-30  Age: 51 y.o. MRN: [id number]  CC: The primary encounter diagnosis was Slow transit constipation. A diagnosis of Constipation by delayed colonic transit was also pertinent to this visit.  HPI [name] presents for evaluation of new onset constipation , bloating and a feeling of incomplete defecation attributed to hemorrhoids  History :  Had Rectal burning and pain and scant amount of blood during a normal bowel movement. Felt an external hemorrhoid,  Borrowed some hemorrhoid cream , took miralax, correctol for a few days ,  Soaked in warm water.  And the the bleeding finally stopped. Several days later developed abd distension and constipation , despite taking the miralax and correctol.  . Stools have been narrow caliber and long which is new for her (has IBS diarrhea) and feels uncomfortable in both inguinal areas.  And has not seen any bleeding in 2 weeks.   Last colonoscopy report from  2014 reviewed:  no hemorrhoids mentioned .    Outpatient Medications Prior to Visit  Medication Sig Dispense Refill  . buPROPion (WELLBUTRIN SR) 100 MG 12 hr tablet TAKE 1 TABLET BY MOUTH TWICE A DAY 180 tablet 1  . cetirizine (ZYRTEC) 10 MG tablet Take 10 mg by mouth daily.      . clonazePAM (KLONOPIN) 1 MG tablet TAKE 1 TABLET BY MOUTH AT BEDTIME 30 tablet 3  . cyclobenzaprine (FLEXERIL) 10 MG tablet Take 1 tablet (10 mg total) by mouth 3 (three) times daily as needed for muscle spasms. 30 tablet 0  . diazepam (VALIUM) 10 MG tablet TAKE 1 TABLET BY MOUTH DAILY AS NEEDED FOR ANXIETY AND MUSCLE SPASMS 15 tablet 0  . FLUoxetine (PROZAC) 20 MG tablet TAKE 3 TABLETS (60 MG TOTAL) BY MOUTH DAILY. 90 tablet 3  . fluticasone (FLONASE) 50 MCG/ACT nasal spray Place 2 sprays into both nostrils daily. 16 g 2  . ibuprofen (ADVIL,MOTRIN) 800 MG tablet Take 1 tablet (800 mg total) by mouth every 8 (eight) hours as needed. 30 tablet 0  .  levonorgestrel (MIRENA) 20 MCG/24HR IUD 1 Intra Uterine Device (1 each total) by Intrauterine route once. 1 each 0  . nystatin (MYCOSTATIN/NYSTOP) powder Apply topically 2 (two) times daily. To affected area 15 g 1  . omeprazole (PRILOSEC OTC) 20 MG tablet Take 20 mg by mouth daily.    . pramipexole (MIRAPEX) 0.125 MG tablet TAKE 1 TABLET BY MOUTH DAILY AT BEDTIME. INCREASE WEEKLY AS NEEDED 90 tablet 3  . rizatriptan (MAXALT) 10 MG tablet TAKE 1 TABLET (10 MG TOTAL) BY MOUTH AS NEEDED FOR MIGRAINE. MAY REPEAT IN 2 HOURS IF NEEDED 10 tablet 2  . topiramate (TOPAMAX) 100 MG tablet Take 1 tablet (100 mg total) by mouth daily. 90 tablet 3  . Lorcaserin HCl 10 MG TABS Take 1 tablet by mouth daily before breakfast. (Patient not taking: Reported on 07/27/2017) 30 tablet 3  . predniSONE (DELTASONE) 10 MG tablet Take 6 tabs (60 mg) PO x 3 days, then take 4 tabs (40 mg) PO x 3 days, then take 2 tabs (20 mg) PO x 3 days, then take 1 tab (10 mg) PO x 3 days, then take 1/2 tab (5 mg) PO x 4 days. (Patient not taking: Reported on 07/27/2017) 41 tablet 0   No facility-administered medications prior to visit.     Review of Systems;  Patient denies headache, fevers, malaise, unintentional weight loss, skin rash, eye pain, sinus congestion  and sinus pain, sore throat, dysphagia,  hemoptysis , cough, dyspnea, wheezing, chest pain, palpitations, orthopnea, edema, abdominal pain, nausea, melena, diarrhea, constipation, flank pain, dysuria, hematuria, urinary  Frequency, nocturia, numbness, tingling, seizures,  Focal weakness, Loss of consciousness,  Tremor, insomnia, depression, anxiety, and suicidal ideation.      Objective:  BP 102/66 (BP Location: Left Arm, Patient Position: Sitting, Cuff Size: Large)   Pulse 92   Temp 97.9 F (36.6 C) (Oral)   Resp 15   Ht 5\' 2"  (1.575 m)   Wt 237 lb 12.8 oz (107.9 kg)   SpO2 98%   BMI 43.49 kg/m   BP Readings from Last 3 Encounters:  09/20/17 102/66  07/07/17 120/82    06/24/17 120/74    Wt Readings from Last 3 Encounters:  09/20/17 237 lb 12.8 oz (107.9 kg)  07/07/17 234 lb 12.8 oz (106.5 kg)  06/24/17 234 lb (106.1 kg)    General appearance: alert, cooperative and appears stated age Neck: no adenopathy, no carotid bruit, supple, symmetrical, trachea midline and thyroid not enlarged, symmetric, no tenderness/mass/nodules Back: symmetric, no curvature. ROM normal. No CVA tenderness. Lungs: clear to auscultation bilaterally Heart: regular rate and rhythm, S1, S2 normal, no murmur, click, rub or gallop Abdomen: soft, non-tender; bowel sounds normal; no masses,  no organomegaly Pulses: 2+ and symmetric Skin: Skin color, texture, turgor normal. No rashes or lesions Lymph nodes: Cervical, supraclavicular, and axillary nodes normal. Rectal: internal hemorrhoids, non thrombosed,  Stool in vault  Lab Results  Component Value Date   HGBA1C 5.8 05/17/2017   HGBA1C 5.5 05/21/2016   HGBA1C 5.8 04/16/2014    Lab Results  Component Value Date   CREATININE 0.95 09/20/2017   CREATININE 0.85 06/24/2017   CREATININE 0.88 05/17/2017    Lab Results  Component Value Date   WBC 9.1 06/24/2017   HGB 13.7 06/24/2017   HCT 39.6 06/24/2017   PLT 313 06/24/2017   GLUCOSE 78 09/20/2017   CHOL 236 (H) 05/17/2017   TRIG 128.0 05/17/2017   HDL 53.40 05/17/2017   LDLDIRECT 171 (H) 05/21/2016   LDLCALC 157 (H) 05/17/2017   ALT 16 05/17/2017   AST 14 05/17/2017   NA 136 09/20/2017   K 3.8 09/20/2017   CL 103 09/20/2017   CREATININE 0.95 09/20/2017   BUN 16 09/20/2017   CO2 23 09/20/2017   TSH 1.18 09/20/2017   INR 1.0 07/13/2008   HGBA1C 5.8 05/17/2017   MICROALBUR 0.50 06/17/2012    Dg Chest 2 View  Result Date: 06/24/2017 CLINICAL DATA:  Chest pain for 3 weeks.  Left-sided pain. EXAM: CHEST  2 VIEW COMPARISON:  CT chest 07/14/2008 FINDINGS: The heart size and mediastinal contours are within normal limits. Both lungs are clear. The visualized  skeletal structures are unremarkable. IMPRESSION: No active cardiopulmonary disease. Electronically Signed   By: Kathreen Devoid   On: 06/24/2017 16:45    Assessment & Plan:   Problem List Items Addressed This Visit    Constipation by delayed colonic transit    Trial of lactulose,  Followed by GoLytely if no success. Magnesiu, Lytes and thyroid  are all normal.   Lab Results  Component Value Date   TSH 1.18 09/20/2017   Lab Results  Component Value Date   NA 136 09/20/2017   K 3.8 09/20/2017   CL 103 09/20/2017   CO2 23 09/20/2017   .lastma        Other Visit Diagnoses    Slow transit constipation    -  Primary   Relevant Orders   TSH (Completed)   Basic metabolic panel (Completed)   Magnesium (Completed)      I have discontinued [name] S[place]ersheim's Lorcaserin HCl and predniSONE. I am also having her start on lactulose and polyethylene glycol-electrolytes. Additionally, I am having her maintain her cetirizine, levonorgestrel, ibuprofen, topiramate, pramipexole, clonazePAM, fluticasone, cyclobenzaprine, rizatriptan, diazepam, nystatin, buPROPion, FLUoxetine, and omeprazole.  Meds ordered this encounter  Medications  . lactulose (CHRONULAC) 10 GM/15ML solution    Sig: Take 30 mLs (20 g total) by mouth 3 (three) times daily. For relief of constipation    Dispense:  236 mL    Refill:  0  . polyethylene glycol-electrolytes (NULYTELY/GOLYTELY) 420 g solution    Sig: Drink an 8 ounce glass every 30 minutes until constipation is relieved    Dispense:  4000 mL    Refill:  0    Medications Discontinued During This Encounter  Medication Reason  . Lorcaserin HCl 10 MG TABS Patient has not taken in last 30 days  . predniSONE (DELTASONE) 10 MG tablet Completed Course    Follow-up: No Follow-up on file.   [name], MD

## 2017-09-20 NOTE — Telephone Encounter (Signed)
Pt has an appt with Dr. Derrel Nip today at 4:00pm.

## 2017-09-20 NOTE — Patient Instructions (Addendum)
   I am prescribing a "cathartic" laxative called Lactulose to use a maximum of 3 times to relieve your constipation  If no improvement/resolution of symptoms,  Fill the NuLytely and drinks several glasses of it over a period of a few hours

## 2017-09-21 ENCOUNTER — Encounter: Payer: Self-pay | Admitting: Internal Medicine

## 2017-09-21 ENCOUNTER — Telehealth: Payer: Self-pay | Admitting: Internal Medicine

## 2017-09-21 DIAGNOSIS — K5901 Slow transit constipation: Secondary | ICD-10-CM | POA: Insufficient documentation

## 2017-09-21 LAB — BASIC METABOLIC PANEL
BUN: 16 mg/dL (ref 6–23)
CO2: 23 mEq/L (ref 19–32)
Calcium: 9 mg/dL (ref 8.4–10.5)
Chloride: 103 mEq/L (ref 96–112)
Creatinine, Ser: 0.95 mg/dL (ref 0.40–1.20)
GFR: 66.33 mL/min (ref >60.00)
Glucose, Bld: 78 mg/dL (ref 70–99)
Potassium: 3.8 mEq/L (ref 3.5–5.1)
Sodium: 136 mEq/L (ref 135–145)

## 2017-09-21 LAB — TSH: TSH: 1.18 u[IU]/mL (ref 0.35–4.50)

## 2017-09-21 LAB — MAGNESIUM: Magnesium: 2 mg/dL (ref 1.5–2.5)

## 2017-09-21 NOTE — Telephone Encounter (Signed)
Copied from [facility] [phone]. Topic: Quick Communication - Rx Refill/Question >> Sep 21, 2017  4:13 PM Selena [name] wrote: Pt called to get a refill of her klonopin, contact pt or pharmacy if needed

## 2017-09-21 NOTE — Telephone Encounter (Signed)
Please advise 

## 2017-09-21 NOTE — Assessment & Plan Note (Addendum)
Trial of lactulose,  Followed by GoLytely if no success. Magnesiu, Lytes and thyroid  are all normal.   Lab Results  Component Value Date   TSH 1.18 09/20/2017   Lab Results  Component Value Date   NA 136 09/20/2017   K 3.8 09/20/2017   CL 103 09/20/2017   CO2 23 09/20/2017   .lastma

## 2017-09-24 ENCOUNTER — Other Ambulatory Visit: Payer: Self-pay

## 2017-09-24 MED ORDER — CLONAZEPAM 1 MG PO TABS: 1.0000 mg | ORAL_TABLET | Freq: Every day | ORAL | 3 refills | Status: DC

## 2017-09-24 NOTE — Telephone Encounter (Signed)
Refilled: 05/10/2017 Last OV: 09/20/2017 Next OV: not scheduled

## 2017-09-24 NOTE — Telephone Encounter (Signed)
Refill request sent to Dr. Derrel Nip

## 2017-09-27 ENCOUNTER — Ambulatory Visit
Admission: RE | Admit: 2017-09-27 | Discharge: 2017-09-27 | Disposition: A | Discharge status: Home / Self Care | Payer: BC Managed Care – PPO | Source: Ambulatory Visit | Attending: Surgery | Admitting: Surgery

## 2017-09-27 DIAGNOSIS — K219 Gastro-esophageal reflux disease without esophagitis: Secondary | ICD-10-CM | POA: Insufficient documentation

## 2017-09-27 DIAGNOSIS — I471 Supraventricular tachycardia: Secondary | ICD-10-CM

## 2017-10-04 ENCOUNTER — Encounter: Payer: BC Managed Care – PPO | Attending: Surgery | Admitting: Registered"

## 2017-10-04 ENCOUNTER — Encounter: Payer: Self-pay | Admitting: Registered"

## 2017-10-04 DIAGNOSIS — Z713 Dietary counseling and surveillance: Secondary | ICD-10-CM | POA: Diagnosis not present

## 2017-10-04 DIAGNOSIS — Z6841 Body Mass Index (BMI) 40.0 and over, adult: Secondary | ICD-10-CM | POA: Insufficient documentation

## 2017-10-04 DIAGNOSIS — E669 Obesity, unspecified: Secondary | ICD-10-CM

## 2017-10-04 NOTE — Progress Notes (Signed)
Pre-Op Assessment Visit:  Pre-Operative Sleeve gastrectomy/RYGB Surgery  Medical Nutrition Therapy:  Appt start time: 10:57  End time:  12:00  Patient was seen on 10/04/2017 for Pre-Operative Nutrition Assessment. Assessment and letter of approval faxed to [facility] Bariatric Surgery Program coordinator on 10/04/2017.   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 Pt states she wants to have sleeve gastrectomy but surgeon wants her to have RYGB. Pt states she has fibromyalgia. Pt states sister had surgery years ago and has stretched her pouch back out; regained weight. Pt states she has gym access in her neighborhood. Pt states she can easily eat a lot of her favorite candy while watching tv at night.   Pt is unsure of how many visits she needs with [place] prior to surgery.    24 hr Dietary Recall: First Meal: Muscle Milk shake  Snack: apple Second Meal: [place] pepperoni, sugar-free pudding,  unsweetened applesauce  Snack: banana Third Meal: chili  Snack: cherry hershey's kisses Beverages: diet coke, sparkling water, green tea, water  Encouraged to engage in 75 minutes of moderate physical activity including cardiovascular and weight baring weekly  Handouts given during visit include:  . Pre-Op Goals . Bariatric Surgery Protein Shakes . Vitamin and Mineral Recommendations  During the appointment today the following Pre-Op Goals were reviewed with the patient: . Track your food and beverage: MyFitness Pal or Baritastic App . Make healthy food choices . Begin to limit portion sizes . Limited concentrated sugars and fried foods . Keep fat/sugar in the single digits per serving on          food labels . Practice CHEWING your food   (aim for 30 chews per bite or until applesauce consistency) . Practice not drinking 15 minutes before, during, and 30 minutes after each meal/snack . Avoid all carbonated beverages  . Avoid/limit caffeinated beverages  . Avoid all sugar-sweetened beverages . Avoid alcohol . Consume 3 meals per day; eat every 3-5 hours . Make a list of non-food related activities . Aim for 64-100 ounces of FLUID daily  . Aim for at least 60-80 grams of PROTEIN daily . Look for a liquid protein source that contain ?15 g protein and ?5 g carbohydrate  (ex: shakes, drinks, shots) . Physical activity is an important part of a healthy lifestyle so keep it moving!  Follow diet recommendations listed below Energy and Macronutrient Recommendations: Calories: 1800 Carbohydrate: 200 Protein: 135 Fat: 50  Demonstrated degree of understanding via:  Teach Back   Teaching Method Utilized:  Visual Auditory Hands on  Barriers to learning/adherence to lifestyle change: none  Patient to call the Nutrition and Diabetes Education Services to enroll in Pre-Op and Post-Op Nutrition Education when surgery date is scheduled.

## 2017-10-14 ENCOUNTER — Encounter: Payer: Self-pay | Admitting: Internal Medicine

## 2017-10-18 ENCOUNTER — Ambulatory Visit: Payer: Self-pay | Admitting: *Deleted

## 2017-10-18 MED ORDER — OSELTAMIVIR PHOSPHATE 75 MG PO CAPS: 75.0000 mg | ORAL_CAPSULE | Freq: Every day | ORAL | 0 refills | Status: DC

## 2017-10-18 NOTE — Telephone Encounter (Signed)
Please advise 

## 2017-10-18 NOTE — Telephone Encounter (Signed)
Sending  Tamiflu for her to take to prevent infection one tablet daily for ten days

## 2017-10-18 NOTE — Telephone Encounter (Signed)
   Reason for Disposition . [1] Caller requesting NON-URGENT health information AND [2] PCP's office is the best resource  Answer Assessment - Initial Assessment Questions 1. 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 She has some cold symptoms- but has not gotten the flu yet- can she get Tamiflu called in? ( patient was unable to get flu shot- allergy to eggs) She uses [facility].  Protocols used: INFORMATION ONLY CALL-A-AH

## 2017-10-19 ENCOUNTER — Other Ambulatory Visit: Payer: Self-pay

## 2017-10-19 MED ORDER — OSELTAMIVIR PHOSPHATE 75 MG PO CAPS: 75.0000 mg | ORAL_CAPSULE | Freq: Every day | ORAL | 0 refills | Status: DC

## 2017-10-21 NOTE — Telephone Encounter (Signed)
Spoke with pt and she stated that she started taking the tamiflu yesterday.

## 2017-10-27 ENCOUNTER — Ambulatory Visit: Payer: BC Managed Care – PPO | Admitting: Cardiovascular Disease

## 2017-10-27 ENCOUNTER — Encounter: Payer: Self-pay | Admitting: Cardiovascular Disease

## 2017-10-27 VITALS — BP 110/82 | HR 90 | Ht 62.0 in | Wt 238.5 lb

## 2017-10-27 DIAGNOSIS — E782 Mixed hyperlipidemia: Secondary | ICD-10-CM

## 2017-10-27 DIAGNOSIS — M797 Fibromyalgia: Secondary | ICD-10-CM | POA: Diagnosis not present

## 2017-10-27 DIAGNOSIS — I479 Paroxysmal tachycardia, unspecified: Secondary | ICD-10-CM

## 2017-10-27 DIAGNOSIS — R0789 Other chest pain: Secondary | ICD-10-CM | POA: Diagnosis not present

## 2017-10-27 NOTE — Patient Instructions (Signed)
Medication Instructions:   No medication changes made  Labwork:  No new labs needed  Testing/Procedures:  No further testing at this time   Follow-Up: It was a pleasure seeing you in the office today. Please call us if you have new issues that need to be addressed before your next appt.  [phone]  Your physician wants you to follow-up in:  As needed  If you need a refill on your cardiac medications before your next appointment, please call your pharmacy.     

## 2017-10-27 NOTE — Progress Notes (Signed)
Cardiology Office Note  Date:  10/27/2017   ID:  Selena [name], DOB 1966/03/14, MRN [id number]  PCP:  [name], MD   Chief Complaint  Patient presents with  . other    Consult placed by Dr.  Derrel Nip for SVT ,  chest pain. Meds reviewed verbally with pt.    HPI:  Selena [name] is a 52 y.o. female with past medical history of chronic chest pain, Pleurisy/chostrochondritis Morbid obesity Tachycardia episodes Who presents by referral from Dr. Derrel Nip for consultation of her tachycardia and chest pain  She reports chest pain symptoms dating back to when she was a teenager Prior workup has been extensive Reports she has been diagnosed with pleurisy/costochondritis Seems to wax and wane  Had several bad episodes in September 2018 Likely triggered by doing a CPR course Was seen in the emergency room 06/24/2017, Treated with prednisone taper Long history of being on NSAIDs Now takes Valium, Flexeril, ibuprofen as needed  Other symptoms of sweating, tachycardia May through August 2018 Symptoms would last for several minutes at a time Reports that she never sweats and family noticed that she was perspiring Reports having heart rate up to 150 bpm on her watch She did appreciate some palpitations Symptoms seem to resolve without intervention, no episodes September through January of this year  She has been evaluated for gastric bypass surgery Contemplating doing it this summer Reports she has tried numerous diets unsuccessfully She feels her weight is affecting her health  EKG personally reviewed by myself on todays visit Shows normal sinus rhythm rate 90 bpm no significant ST or T-wave changes Significant change from previous EKGs  Family history of atrial fibrillation   PMH:   has a past medical history of Anemia, iron deficiency, Anxiety, Asthma, Colon polyps (2014), Costochondritis, Depression, Eating disorder, Fibromyalgia, Gallstones, Heart murmur,  Hyperlipidemia, and Hypertension.  PSH:    Past Surgical History:  Procedure Laterality Date  . CHOLECYSTECTOMY    . 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 GANGLION CYST EXCISION    . TONSILLECTOMY  1989    Current Outpatient Medications  Medication Sig Dispense Refill  . buPROPion (WELLBUTRIN SR) 100 MG 12 hr tablet TAKE 1 TABLET BY MOUTH TWICE A DAY 180 tablet 1  . cetirizine (ZYRTEC) 10 MG tablet Take 10 mg by mouth daily.      . clonazePAM (KLONOPIN) 1 MG tablet Take 1 tablet (1 mg total) by mouth at bedtime. 30 tablet 3  . cyclobenzaprine (FLEXERIL) 10 MG tablet Take 1 tablet (10 mg total) by mouth 3 (three) times daily as needed for muscle spasms. 30 tablet 0  . diazepam (VALIUM) 10 MG tablet TAKE 1 TABLET BY MOUTH DAILY AS NEEDED FOR ANXIETY AND MUSCLE SPASMS 15 tablet 0  . FLUoxetine (PROZAC) 20 MG tablet TAKE 3 TABLETS (60 MG TOTAL) BY MOUTH DAILY. 90 tablet 3  . fluticasone (FLONASE) 50 MCG/ACT nasal spray Place 2 sprays into both nostrils daily. 16 g 2  . ibuprofen (ADVIL,MOTRIN) 800 MG tablet Take 1 tablet (800 mg total) by mouth every 8 (eight) hours as needed. 30 tablet 0  . lactulose (CHRONULAC) 10 GM/15ML solution Take 30 mLs (20 g total) by mouth 3 (three) times daily. For relief of constipation 236 mL 0  . levonorgestrel (MIRENA) 20 MCG/24HR IUD 1 Intra Uterine Device (1 each total) by Intrauterine route once. 1 each 0  . omeprazole (PRILOSEC OTC) 20 MG tablet Take 20 mg by mouth daily.    Marland Kitchen  oseltamivir (TAMIFLU) 75 MG capsule Take 1 capsule (75 mg total) by mouth daily. 10 capsule 0  . pramipexole (MIRAPEX) 0.125 MG tablet TAKE 1 TABLET BY MOUTH DAILY AT BEDTIME. INCREASE WEEKLY AS NEEDED 90 tablet 3  . rizatriptan (MAXALT) 10 MG tablet TAKE 1 TABLET (10 MG TOTAL) BY MOUTH AS NEEDED FOR MIGRAINE. MAY REPEAT IN 2 HOURS IF NEEDED 10 tablet 2  . topiramate (TOPAMAX) 100 MG tablet Take 1 tablet (100 mg total) by mouth daily. 90 tablet 3   No current  facility-administered medications for this visit.      Allergies:   Fish allergy; Augmentin [amoxicillin-pot clavulanate]; Eggs or egg-derived products; and Sulfonamide derivatives   Social History:  The patient  reports that  has never smoked. she has never used smokeless tobacco. She reports that she drinks alcohol. She reports that she does not use drugs.   Family History:   family history includes Arrhythmia in her maternal grandfather, maternal grandmother, and mother; Arthritis in her paternal grandmother; Cancer in her mother and sister; Diabetes in her father; Heart disease in her maternal grandmother and mother; Hyperlipidemia in her mother; Hypertension in her maternal grandmother; Hypothyroidism in her sister; Mental illness in her father.    Review of Systems: Review of Systems  Constitutional: Negative.   Respiratory: Negative.   Cardiovascular: Positive for chest pain.       Tachycardia  Gastrointestinal: Negative.   Musculoskeletal: Negative.   Neurological: Negative.   Psychiatric/Behavioral: Negative.   All other systems reviewed and are negative.    PHYSICAL EXAM: VS:  BP 110/82 (BP Location: Right Arm, Patient Position: Sitting, Cuff Size: Large)   Pulse 90   Ht 5\' 2"  (1.575 m)   Wt 238 lb 8 oz (108.2 kg)   BMI 43.62 kg/m  , BMI Body mass index is 43.62 kg/m. GEN: Well nourished, well developed, in no acute distress  HEENT: normal  Neck: no JVD, carotid bruits, or masses Cardiac: RRR; no murmurs, rubs, or gallops,no edema  Respiratory:  clear to auscultation bilaterally, normal work of breathing GI: soft, nontender, nondistended, + BS MS: no deformity or atrophy  Skin: warm and dry, no rash Neuro:  Strength and sensation are intact Psych: euthymic mood, full affect     Recent Labs: 05/17/2017: ALT 16 06/24/2017: Hemoglobin 13.7; Platelets 313 09/20/2017: BUN 16; Creatinine, Ser 0.95; Magnesium 2.0; Potassium 3.8; Sodium 136; TSH 1.18    Lipid  Panel Lab Results  Component Value Date   CHOL 236 (H) 05/17/2017   HDL 53.40 05/17/2017   LDLCALC 157 (H) 05/17/2017   TRIG 128.0 05/17/2017      Wt Readings from Last 3 Encounters:  10/27/17 238 lb 8 oz (108.2 kg)  10/04/17 235 lb (106.6 kg)  09/20/17 237 lb 12.8 oz (107.9 kg)       ASSESSMENT AND PLAN:  Paroxysmal tachycardia (HCC) Symptoms over the summer 2018 concerning for atrial tachycardia, unable to exclude SVT. Seem to be associated with diaphoresis Lasting 2-3 minutes at a time Unclear trigger. Denies having any stressors at that time As she's not had any episodes for several months, we will not order an event monitor.  Long discussion concerning various treatment options if symptoms recur Recommended she call us if she has frequent episodes, we would order event monitor, consider beta blockers   Atypical chest pain - Plan: EKG 12-Lead  long discussion concerning her symptoms, dating back many years, atypical in nature . Recommended she try to avoid activities  that might aggravate her chest pain/chest wall  Water aerobics and swimming might be therapeutic , light stretching   Mixed hyperlipidemia  problems with statins in the past, reports having elevated liver function test  Talked about zetia  Fibromyalgia  reports her symptoms are stable   Morbid obesity ([place]) Long discussion concerning her struggle whether to proceed with gastric bypass surgery are not . Benefits would include improved hypertension, cholesterol, sugars, orthopedic problems . No further cardiac workup needed if she proceeds with surgery   Disposition:   F/U  As needed   Total encounter time more than 60 minutes  Greater than 50% was spent in counseling and coordination of care with the patient   Patient was seen in consultation for Dr. Derrel Nip and will be referred back to her office for ongoing care of the issues detailed above    Orders Placed This Encounter  Procedures  . EKG 12-Lead      Signed, Esmond Plants, M.D., Ph.D. 10/27/2017  [place], [place]

## 2017-11-01 ENCOUNTER — Ambulatory Visit: Payer: Self-pay

## 2017-11-01 NOTE — Telephone Encounter (Signed)
Pt is calling to report new onset (today) of right cheek tingling and numbness. Pt states it comes and goes and is present at the time of the triage call. Pt states she has been evaluated by a neurologist and was imaged before and states was ok. Pt states that eating and drinking stimulates it to occur.  Pt also having left cheek facial twitching. Pt states it is occurring with more frequency (8-9 times per day). She stated that it comes and goes. Pt states smile is symmetrical. Denies numbness, tingling or weakness to either arm, leg of the body.  Protocol referring pt to go to [facility] or PCP triage. Called PCP office and spoke with Elpidio Galea who advises to send pt to [facility]. Pt states she will go to [facility]. Reason for Disposition . [1] Tingling (e.g., pins and needles) of the face, arm / hand, or leg / foot on one side of the body AND [2] present now  Answer Assessment - Initial Assessment Questions 1. SYMPTOM: "What is the main symptom you are concerned about?" (e.g., weakness, numbness)     Right facial numbness and weakness as well as left facial twitching 2. ONSET: "When did this start?" (minutes, hours, days; while sleeping)     Left facial twitch 3-4 weeks ago and today began right cheek tingling and numbness 3. LAST NORMAL: "When was the last time you were normal (no symptoms)?"     5 weeks ago 4. PATTERN "Does this come and go, or has it been constant since it started?"  "Is it present now?"     Comes and goes. Left facial twitch is happening more frequently 8-9 x per day. The right cheek tingling and numbness comes and goes but stays for a longer period 5. CARDIAC SYMPTOMS: "Have you had any of the following symptoms: chest pain, difficulty breathing, palpitations?"     No chest pain, difficulty breathing or palpitations 6. NEUROLOGIC SYMPTOMS: "Have you had any of the following symptoms: headache, dizziness, vision loss, double vision, changes in speech, unsteady on your feet?"  C/o headache (has a h/o migraines that weather changes affect) 2 out of 10 now and is located above ears and across top of head. No dizziness, vision loss or double vision, changes in speech, no unsteadiness on feet 7. OTHER SYMPTOMS: "Do you have any other symptoms?"     no 8. PREGNANCY: "Is there any chance you are pregnant?" "When was your last menstrual period?"     N/a has a murena IUD  Protocols used: NEUROLOGIC DEFICIT-A-AH

## 2017-11-01 NOTE — Telephone Encounter (Signed)
FYI

## 2017-11-02 ENCOUNTER — Telehealth: Payer: Self-pay | Admitting: Neurology

## 2017-11-02 ENCOUNTER — Encounter: Payer: Self-pay | Admitting: Physical Therapy

## 2017-11-02 NOTE — Telephone Encounter (Signed)
FYI.Marland KitchenMarland KitchenMarland KitchenPt stated she would go to Urgent Care

## 2017-11-02 NOTE — Telephone Encounter (Signed)
Pt hasnt seen doctor in about 2 years but is just wanting to let us know that she has picked up a facial tick and numbness(has came back). Pt has called PCP but cant be seen for awhile and is just wanting to talk to RN to see if there is anything that can be done to help. Please call to discuss

## 2017-11-02 NOTE — Telephone Encounter (Signed)
Per Dr. Felecia Shelling we really need to see patient to advise. Patient is agreeable and we have scheduled an appt for 01/03/18. She is going to keep her OV with her PCP next week as well.

## 2017-11-12 ENCOUNTER — Encounter: Payer: Self-pay | Admitting: Internal Medicine

## 2017-11-12 ENCOUNTER — Ambulatory Visit: Payer: BC Managed Care – PPO | Admitting: Internal Medicine

## 2017-11-12 VITALS — BP 118/70 | HR 91 | Temp 98.1°F | Resp 18 | Ht 62.0 in | Wt 242.1 lb

## 2017-11-12 DIAGNOSIS — K5901 Slow transit constipation: Secondary | ICD-10-CM | POA: Diagnosis not present

## 2017-11-12 DIAGNOSIS — R253 Fasciculation: Secondary | ICD-10-CM | POA: Diagnosis not present

## 2017-11-12 DIAGNOSIS — F411 Generalized anxiety disorder: Secondary | ICD-10-CM | POA: Diagnosis not present

## 2017-11-12 DIAGNOSIS — F959 Tic disorder, unspecified: Secondary | ICD-10-CM | POA: Diagnosis not present

## 2017-11-12 MED ORDER — CLONAZEPAM 0.5 MG PO TABS: ORAL_TABLET | ORAL | 1 refills | Status: DC

## 2017-11-12 NOTE — Progress Notes (Signed)
Pre-visit discussion using our clinic review tool. No additional management support is needed unless otherwise documented below in the visit note.  

## 2017-11-12 NOTE — Progress Notes (Signed)
Subjective:  Patient ID: Selena [name], female    DOB: [birth year]  Age: 52 y.o. MRN: [id number]  CC: The primary encounter diagnosis was Muscle twitch. Diagnoses of Generalized anxiety disorder, Facial tic, and Constipation by delayed colonic transit were also pertinent to this visit.  HPI Selena [name] presents for 6 week history of intermittent facial tic involving the crease at the left corner of her mouth mouth.  The twitch initially occurred just a few times per day,  But has been occurring more frequently ,  Up to 6 t 7 tims per day for the last 3 weeks.  It occurs spontaneously,  bt she is also  able to trigger it by pursing her lips.   Also has been having a recurrence of the right sided facial  parasthesia (described  as a pickling feeling ) on right side of face that resolved last year with use of mouth guard.  The  symptom is intermittent .  Has seen her hdentist,  Mouthguard is in good position not worn.  Valium has not helped   Patient  Has been seen every 2-3 months for various neurologic, psychiatric  and somatic complaints.  She Has not seen a neurologist in several years, but has an appointment in march.  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  The texts  occur en the evenings as well, when she is  trying to attend graduate school classes.   Still constipated and passing blood with stools once a week.  Mild pain .  Taking miralax prn    Outpatient Medications Prior to Visit  Medication Sig Dispense Refill  . cetirizine (ZYRTEC) 10 MG tablet Take 10 mg by mouth daily.      . cyclobenzaprine  (FLEXERIL) 10 MG tablet Take 1 tablet (10 mg total) by mouth 3 (three) times daily as needed for muscle spasms. 30 tablet 0  . diazepam (VALIUM) 10 MG tablet TAKE 1 TABLET BY MOUTH DAILY AS NEEDED FOR ANXIETY AND MUSCLE SPASMS 15 tablet 0  . FLUoxetine (PROZAC) 20 MG tablet TAKE 3 TABLETS (60 MG TOTAL) BY MOUTH DAILY. 90 tablet 3  . fluticasone (FLONASE) 50 MCG/ACT nasal spray Place 2 sprays into both nostrils daily. 16 g 2  . ibuprofen (ADVIL,MOTRIN) 800 MG tablet Take 1 tablet (800 mg total) by mouth every 8 (eight) hours as needed. 30 tablet 0  . levonorgestrel (MIRENA) 20 MCG/24HR IUD 1 Intra Uterine Device (1 each total) by Intrauterine route once. 1 each 0  . omeprazole (PRILOSEC OTC) 20 MG tablet Take 20 mg by mouth daily.    Marland Kitchen oseltamivir (TAMIFLU) 75 MG capsule Take 1 capsule (75 mg total) by mouth daily. 10 capsule 0  . pramipexole (MIRAPEX) 0.125 MG tablet TAKE 1 TABLET BY MOUTH DAILY AT BEDTIME. INCREASE WEEKLY AS NEEDED 90 tablet 3  . rizatriptan (MAXALT) 10 MG tablet TAKE 1 TABLET (10 MG TOTAL) BY MOUTH AS NEEDED FOR MIGRAINE. MAY REPEAT IN 2 HOURS IF NEEDED 10 tablet 2  . topiramate (TOPAMAX) 100 MG tablet Take 1 tablet (100 mg total) by mouth daily. [place]  tablet 3  . clonazePAM (KLONOPIN) 1 MG tablet Take 1 tablet (1 mg total) by mouth at bedtime. 30 tablet 3  . lactulose (CHRONULAC) 10 GM/15ML solution Take 30 mLs (20 g total) by mouth 3 (three) times daily. For relief of constipation (Patient not taking: Reported on 11/12/2017) 236 mL 0  . buPROPion (WELLBUTRIN SR) 100 MG 12 hr tablet TAKE 1 TABLET BY MOUTH TWICE A DAY (Patient not taking: Reported on 11/12/2017) 180 tablet 1   No facility-administered medications prior to visit.     Review of Systems;  Patient denies headache, fevers, malaise, unintentional weight loss, skin rash, eye pain, sinus congestion and sinus pain, sore throat, dysphagia,  hemoptysis , cough, dyspnea, wheezing, chest pain, palpitations, orthopnea, edema,  abdominal pain, nausea, melena, diarrhea, constipation, flank pain, dysuria, hematuria, urinary  Frequency, nocturia, numbness, tingling, seizures,  Focal weakness, Loss of consciousness,  Tremor, insomnia, depression, anxiety, and suicidal ideation.      Objective:  BP 118/70 (BP Location: Left Arm, Patient Position: Sitting, Cuff Size: Large)   Pulse 91   Temp 98.1 F (36.7 C) (Oral)   Resp 18   Ht 5\' 2"  (1.575 m)   Wt 242 lb 2 oz (109.8 kg)   SpO2 98%   BMI 44.29 kg/m   BP Readings from Last 3 Encounters:  11/12/17 118/70  10/27/17 110/82  09/20/17 102/66    Wt Readings from Last 3 Encounters:  11/12/17 242 lb 2 oz (109.8 kg)  10/27/17 238 lb 8 oz (108.2 kg)  10/04/17 235 lb (106.6 kg)    General appearance: alert, cooperative and appears stated age Neck: no adenopathy, no carotid bruit, supple, symmetrical, trachea midline and thyroid not enlarged, symmetric, no tenderness/mass/nodules Back: symmetric, no curvature. ROM normal. No CVA tenderness. Lungs: clear to auscultation bilaterally Heart: regular rate and rhythm, S1, S2 normal, no murmur, click, rub or gallop Abdomen: soft, non-tender; bowel sounds normal; no masses,  no organomegaly Pulses: 2+ and symmetric Neuro: CNs 2-12 intact. DTRs 2+/4 in biceps, brachioradialis, patellars and achilles. Muscle strength 5/5 in upper and lower exremities. Fine resting tremor bilaterally both hands cerebellar function normal. Romberg negative.  No pronator drift.   Gait normal.   Lab Results  Component Value Date   HGBA1C 5.8 05/17/2017   HGBA1C 5.5 05/21/2016   HGBA1C 5.8 04/16/2014    Lab Results  Component Value Date   CREATININE 0.86 11/12/2017   CREATININE 0.95 09/20/2017   CREATININE 0.85 06/24/2017    Lab Results  Component Value Date   WBC 9.1 06/24/2017   HGB 13.7 06/24/2017   HCT 39.6 06/24/2017   PLT 313 06/24/2017   GLUCOSE 83 11/12/2017   CHOL 236 (H) 05/17/2017   TRIG 128.0 05/17/2017   HDL 53.40  05/17/2017   LDLDIRECT 171 (H) 05/21/2016   LDLCALC 157 (H) 05/17/2017   ALT 16 05/17/2017   AST 14 05/17/2017   NA 137 11/12/2017   K 3.7 11/12/2017   CL 107 11/12/2017   CREATININE 0.86 11/12/2017   BUN 16 11/12/2017   CO2 19 (L) 11/12/2017   TSH 1.18 09/20/2017   INR 1.0 07/13/2008   HGBA1C 5.8 05/17/2017   MICROALBUR 0.50 06/17/2012     Assessment & Plan:   Problem List Items Addressed This Visit    Generalized anxiety disorder    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  Medication changes today include adding 0.25 mg clonazepam to morning .  Continue prozac 60 mg daily and  1 mg clonazepam at bedtime for chronic insomnia       Relevant Orders   Ambulatory referral to Psychology   Facial tic    DDX includes hypocalcemia, tic doloreaux , ALS,  Other neurologic degenerative processes, all of which are unlikely given the absence of concurrent dysphagia, dysarthria, weight loss and limb weakness.  She is already taking topamax 100 mg daily for headache prevention as well as mirapex at bedtime for RLs.  Consider polypharmacy vs anxiety.  No new medsor imaging studies were ordered  today except 0. 25 mg clonazepam in the am.  Follow up with neurology       Relevant Medications   clonazePAM (KLONOPIN) 0.5 MG tablet   Constipation by delayed colonic transit    Previously managed with lactulose , but recurrentl.   Lytes and thyroid  are all normal.  Recommend daily use of miralax and a stool softener.   Lab Results  Component Value Date   TSH 1.18 09/20/2017   Lab Results  Component Value Date   NA 137 11/12/2017   K 3.7 11/12/2017   CL 107 11/12/2017   CO2 19 (L) 11/12/2017   .lastma        Other Visit Diagnoses    Muscle twitch    -  Primary   Relevant Orders   Basic Metabolic Panel  (BMET) (Completed)   Magnesium (Completed)      I have discontinued [name] Pracht's buPROPion and clonazePAM. I am also having her start on clonazePAM. Additionally, I am having her maintain her cetirizine, levonorgestrel, ibuprofen, topiramate, pramipexole, fluticasone, cyclobenzaprine, rizatriptan, diazepam, FLUoxetine, omeprazole, lactulose, and oseltamivir.  Meds ordered this encounter  Medications  . clonazePAM (KLONOPIN) 0.5 MG tablet    Sig: 1/2 tablet in morning  2 tablets at night    Dispense:  75 tablet    Refill:  1    Medications Discontinued During This Encounter  Medication Reason  . buPROPion (WELLBUTRIN SR) 100 MG 12 hr tablet   . clonazePAM (KLONOPIN) 1 MG tablet     Follow-up: No Follow-up on file.   [name], MD

## 2017-11-12 NOTE — Patient Instructions (Addendum)
I think your facial tic (muscle twitch)  is due to your stress level at work,  But I am checking your electrolytes today  I recommend a trial of 0.25 clonazepam for morning use and continue 1 mg at bedtime if needed (new rx written )    For your rectal bleeding and constipation :  Take the miralax and a stool softener 100 mg every night

## 2017-11-13 LAB — BASIC METABOLIC PANEL
BUN: 16 mg/dL (ref 7–25)
CO2: 19 mmol/L — ABNORMAL LOW (ref 20–32)
Calcium: 8.6 mg/dL (ref 8.6–10.2)
Chloride: 107 mmol/L (ref 98–110)
Creat: 0.86 mg/dL (ref 0.50–1.10)
Glucose, Bld: 83 mg/dL (ref 65–99)
Potassium: 3.7 mmol/L (ref 3.5–5.3)
Sodium: 137 mmol/L (ref 135–146)

## 2017-11-13 LAB — MAGNESIUM: Magnesium: 2.1 mg/dL (ref 1.5–2.5)

## 2017-11-14 ENCOUNTER — Encounter: Payer: Self-pay | Admitting: Internal Medicine

## 2017-11-14 DIAGNOSIS — F959 Tic disorder, unspecified: Secondary | ICD-10-CM | POA: Insufficient documentation

## 2017-11-14 NOTE — Assessment & Plan Note (Addendum)
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  Medication changes today include adding 0.25 mg clonazepam to morning .  Continue prozac 60 mg daily and  1 mg clonazepam at bedtime for chronic insomnia

## 2017-11-14 NOTE — Assessment & Plan Note (Signed)
Previously managed with lactulose , but recurrentl.   Lytes and thyroid  are all normal.  Recommend daily use of miralax and a stool softener.   Lab Results  Component Value Date   TSH 1.18 09/20/2017   Lab Results  Component Value Date   NA 137 11/12/2017   K 3.7 11/12/2017   CL 107 11/12/2017   CO2 19 (L) 11/12/2017   .lastma

## 2017-11-14 NOTE — Assessment & Plan Note (Signed)
DDX includes hypocalcemia, tic doloreaux , ALS,  Other neurologic degenerative processes, all of which are unlikely given the absence of concurrent dysphagia, dysarthria, weight loss and limb weakness.  She is already taking topamax 100 mg daily for headache prevention as well as mirapex at bedtime for RLs.  Consider polypharmacy vs anxiety.  No new medsor imaging studies were ordered  today except 0. 25 mg clonazepam in the am.  Follow up with neurology

## 2017-11-16 ENCOUNTER — Telehealth: Payer: Self-pay

## 2017-11-16 NOTE — Telephone Encounter (Signed)
Copied from [place]. Topic: General - Other >> Nov 16, 2017 10:47 AM Bennye Alm wrote: Marlane Hatcher a case manager with [employer] called wanting to let PCP know that Starlyn has enrolled in case mgt and now has a nurse to work with her and offer B/H, Pharm, Dietician and SW services. Doesn't change appts or anything but have to let PCP know she's enrolled in case mgt and if PCP would like for pt to be educated in something specifically, they are able to do that. Best call back # is [phone] or [phone] ext [phone]

## 2017-11-16 NOTE — Telephone Encounter (Signed)
Yes, I would like to refer her to dietician/nutrition for obesity management,

## 2017-11-16 NOTE — Telephone Encounter (Signed)
FYI

## 2017-11-18 NOTE — Telephone Encounter (Signed)
Spoke with Selena [name] and informed her that Dr. Derrel Nip would like for the pt to be referred for dietician/nutrition for obesity management. Selena [name] stated that she would get the referral done this afternoon.

## 2017-11-23 ENCOUNTER — Telehealth: Payer: Self-pay | Admitting: Internal Medicine

## 2017-11-23 NOTE — Telephone Encounter (Signed)
Copied from [place] [phone]. Topic: Quick Communication - See Telephone Encounter >> Nov 23, 2017  8:46 AM Synthia Innocent wrote: CRM for notification. See Telephone encounter for:  [employer] my [employer] calling. Requesting PA on Saxenda, Ref# [id number] 11/23/17.

## 2017-11-23 NOTE — Telephone Encounter (Signed)
Spoke with covermymeds and informed them that we are no longer needing the PA for saxenda due to change in therapy for the pt.

## 2017-11-23 NOTE — Telephone Encounter (Signed)
Cover my meds, Evan call back [phone]

## 2017-11-24 ENCOUNTER — Other Ambulatory Visit: Payer: Self-pay | Admitting: Internal Medicine

## 2017-11-26 ENCOUNTER — Other Ambulatory Visit: Payer: Self-pay | Admitting: Internal Medicine

## 2017-12-01 ENCOUNTER — Encounter (INDEPENDENT_AMBULATORY_CARE_PROVIDER_SITE_OTHER): Payer: BC Managed Care – PPO

## 2017-12-03 ENCOUNTER — Ambulatory Visit: Payer: BC Managed Care – PPO | Admitting: Psychology

## 2018-01-03 ENCOUNTER — Ambulatory Visit: Payer: Self-pay | Admitting: Neurology

## 2018-01-03 ENCOUNTER — Encounter: Payer: BC Managed Care – PPO | Attending: Surgery | Admitting: Skilled Nursing Facility1

## 2018-01-03 DIAGNOSIS — Z713 Dietary counseling and surveillance: Secondary | ICD-10-CM | POA: Insufficient documentation

## 2018-01-03 DIAGNOSIS — Z6841 Body Mass Index (BMI) 40.0 and over, adult: Secondary | ICD-10-CM | POA: Diagnosis not present

## 2018-01-04 ENCOUNTER — Encounter: Payer: Self-pay | Admitting: Skilled Nursing Facility1

## 2018-01-04 NOTE — Progress Notes (Signed)
Pre-Operative Nutrition Class:  Appt start time: 7829   End time:  1830.  Patient was seen on 01/03/2018 for Pre-Operative Bariatric Surgery Education at [facility].   Surgery date:  Surgery type: sleeve Start weight at [facility]: 235 Weight today: 242.6  Samples given per MNT protocol. Patient educated on appropriate usage: Bariatric Advantage Multivitamin Lot # [id number] Exp: 07/19  Bariatric Advantage Calcium  Lot # [id number] Exp: sep-05-2018  Renee Pain Protein Shake Lot # [id number] Exp: oct-13-2019  The following the learning objectives were met by the patient during this course:  Identify Pre-Op Dietary Goals and will begin 2 weeks pre-operatively  Identify appropriate sources of fluids and proteins   State protein recommendations and appropriate sources pre and post-operatively  Identify Post-Operative Dietary Goals and will follow for 2 weeks post-operatively  Identify appropriate multivitamin and calcium sources  Describe the need for physical activity post-operatively and will follow MD recommendations  State when to call healthcare provider regarding medication questions or post-operative complications  Handouts given during class include:  Pre-Op Bariatric Surgery Diet Handout  Protein Shake Handout  Post-Op Bariatric Surgery Nutrition Handout  BELT Program Information Flyer  Support Group Information Flyer  [facility] Bariatric Supplements Price List  Follow-Up Plan: [facility] for diet advancement per MD.

## 2018-01-12 ENCOUNTER — Other Ambulatory Visit: Payer: Self-pay

## 2018-01-12 ENCOUNTER — Ambulatory Visit: Payer: BC Managed Care – PPO | Admitting: Internal Medicine

## 2018-01-12 ENCOUNTER — Encounter: Payer: Self-pay | Admitting: Internal Medicine

## 2018-01-12 DIAGNOSIS — F959 Tic disorder, unspecified: Secondary | ICD-10-CM | POA: Diagnosis not present

## 2018-01-12 DIAGNOSIS — F329 Major depressive disorder, single episode, unspecified: Secondary | ICD-10-CM | POA: Diagnosis not present

## 2018-01-12 DIAGNOSIS — F411 Generalized anxiety disorder: Secondary | ICD-10-CM

## 2018-01-12 DIAGNOSIS — Z01818 Encounter for other preprocedural examination: Secondary | ICD-10-CM

## 2018-01-12 DIAGNOSIS — F32A Depression, unspecified: Secondary | ICD-10-CM

## 2018-01-12 MED ORDER — CLONAZEPAM 1 MG PO TABS: 1.0000 mg | ORAL_TABLET | Freq: Every day | ORAL | 5 refills | Status: DC

## 2018-01-12 MED ORDER — BUPROPION HCL 100 MG PO TABS: 100.0000 mg | ORAL_TABLET | Freq: Two times a day (BID) | ORAL | 0 refills | Status: DC

## 2018-01-12 NOTE — Patient Instructions (Addendum)
Start the wellbutrin at 100 mg twice daily for 2 weeks  After one week reduce prozac to 40 mg daily  After two weeks reduce prozac to 20 mg daily  After 3 weeks stop prozac completely

## 2018-01-12 NOTE — Progress Notes (Signed)
Subjective:  Patient ID: Selena [name], female    DOB: [birth year]  Age: 52 y.o. MRN: [id number]  CC: Diagnoses of Facial tic, Generalized anxiety disorder, Morbid obesity ([place]), Preoperative evaluation to rule out surgical contraindication, and Chronic depressive disorder were pertinent to this visit.  HPI [name] presents for follow up on GAD and obesity.    She notes that her previously reported facial tic almost completely resolved until this week.  She has been  seeing a new  psychologist weekly , who recommended a change in SSRI  Given her persistent depression despite taking  60mg  prozac daily .  She has reduced her use of clonazepam  to 1 mg at night only.  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     Obesity:  She has decided to have bariatric surgery. [facility] scheduled for April 22    Outpatient Medications Prior to Visit  Medication Sig Dispense Refill  . cetirizine (ZYRTEC) 10 MG tablet Take 10 mg by mouth daily as needed for allergies.     . diazepam (VALIUM) 10 MG tablet TAKE 1 TABLET BY MOUTH DAILY AS NEEDED FOR ANXIETY AND MUSCLE SPASMS 15 tablet 0  . FLUoxetine (PROZAC) 20 MG tablet TAKE 3 TABLETS (60 MG TOTAL) BY MOUTH DAILY. (Patient taking differently: Take 20-60 mg by mouth See admin instructions. Tapering dose down--beginning 4/15 start taking 2 tablets in the morning, then 04/22 decrease to 1 tablet (20 mg) in the morning, then stop & begin take 2 tablets twice daily of Wellbutrin.) 90 tablet 1  . fluticasone (FLONASE) 50 MCG/ACT nasal spray Place 2 sprays into both nostrils daily. (Patient taking differently: Place 2 sprays into both nostrils daily as needed for allergies (stuffy nose). ) 16 g 2  . lactulose (CHRONULAC) 10 GM/15ML solution Take 30 mLs (20 g total) by mouth 3 (three) times daily. For relief of constipation (Patient not taking: Reported on 11/12/2017) 236 mL 0  .  levonorgestrel (MIRENA) 20 MCG/24HR IUD 1 Intra Uterine Device (1 each total) by Intrauterine route once. 1 each 0  . omeprazole (PRILOSEC OTC) 20 MG tablet Take 20 mg by mouth daily before breakfast.     . oseltamivir (TAMIFLU) 75 MG capsule Take 1 capsule (75 mg total) by mouth daily. (Patient not taking: Reported on 01/12/2018) 10 capsule 0  . pramipexole (MIRAPEX) 0.125 MG tablet TAKE 1 TABLET BY MOUTH DAILY AT BEDTIME. INCREASE WEEKLY AS NEEDED (Patient taking differently: TAKE 1 TABLET (0.125) BY MOUTH DAILY AT BEDTIME) 90 tablet 3  . rizatriptan (MAXALT) 10 MG tablet TAKE 1 TABLET (10 MG TOTAL) BY MOUTH AS NEEDED FOR MIGRAINE. MAY REPEAT IN 2 HOURS IF NEEDED 10 tablet 2  . topiramate (TOPAMAX) 100 MG tablet TAKE 1 TABLET (100 MG TOTAL) BY MOUTH DAILY. (Patient taking differently: Take 100 mg by mouth at bedtime. ) 90 tablet 1  . clonazePAM (KLONOPIN) 0.5 MG tablet 1/2 tablet in morning  2 tablets at night 75 tablet 1  . cyclobenzaprine (FLEXERIL) 10 MG tablet Take 1 tablet (10 mg total) by mouth 3 (three) times daily as needed for muscle spasms. (Patient not taking: Reported on 01/12/2018) 30 tablet 0  . ibuprofen (ADVIL,MOTRIN) 800 MG tablet Take 1 tablet (800 mg total) by mouth every 8 (eight) hours as needed. (Patient not taking: Reported on 01/12/2018) 30 tablet 0   No facility-administered medications prior to visit.  Review of Systems;  Patient denies headache, fevers, malaise, unintentional weight loss, skin rash, eye pain, sinus congestion and sinus pain, sore throat, dysphagia,  hemoptysis , cough, dyspnea, wheezing, chest pain, palpitations, orthopnea, edema, abdominal pain, nausea, melena, diarrhea, constipation, flank pain, dysuria, hematuria, urinary  Frequency, nocturia, numbness, tingling, seizures,  Focal weakness, Loss of consciousness,  Tremor, insomnia, depression, anxiety, and suicidal ideation.      Objective:  BP 118/76 (BP Location: Left Arm, Patient Position:  Sitting, Cuff Size: Large)   Pulse 93   Temp 98 F (36.7 C)   Wt 239 lb 6.4 oz (108.6 kg)   BMI 43.79 kg/m   BP Readings from Last 3 Encounters:  01/12/18 118/76  11/12/17 118/70  10/27/17 110/82    Wt Readings from Last 3 Encounters:  01/12/18 239 lb 6.4 oz (108.6 kg)  01/04/18 242 lb 9.6 oz (110 kg)  11/12/17 242 lb 2 oz (109.8 kg)    General appearance: alert, cooperative and appears stated age Ears: normal TM's and external ear canals both ears Throat: lips, mucosa, and tongue normal; teeth and gums normal Neck: no adenopathy, no carotid bruit, supple, symmetrical, trachea midline and thyroid not enlarged, symmetric, no tenderness/mass/nodules Back: symmetric, no curvature. ROM normal. No CVA tenderness. Lungs: clear to auscultation bilaterally Heart: regular rate and rhythm, S1, S2 normal, no murmur, click, rub or gallop Abdomen: soft, non-tender; bowel sounds normal; no masses,  no organomegaly Pulses: 2+ and symmetric Skin: Skin color, texture, turgor normal. No rashes or lesions Lymph nodes: Cervical, supraclavicular, and axillary nodes normal.  Lab Results  Component Value Date   HGBA1C 5.8 05/17/2017   HGBA1C 5.5 05/21/2016   HGBA1C 5.8 04/16/2014    Lab Results  Component Value Date   CREATININE 0.86 11/12/2017   CREATININE 0.95 09/20/2017   CREATININE 0.85 06/24/2017    Lab Results  Component Value Date   WBC 9.1 06/24/2017   HGB 13.7 06/24/2017   HCT 39.6 06/24/2017   PLT 313 06/24/2017   GLUCOSE 83 11/12/2017   CHOL 236 (H) 05/17/2017   TRIG 128.0 05/17/2017   HDL 53.40 05/17/2017   LDLDIRECT 171 (H) 05/21/2016   LDLCALC 157 (H) 05/17/2017   ALT 16 05/17/2017   AST 14 05/17/2017   NA 137 11/12/2017   K 3.7 11/12/2017   CL 107 11/12/2017   CREATININE 0.86 11/12/2017   BUN 16 11/12/2017   CO2 19 (L) 11/12/2017   TSH 1.18 09/20/2017   INR 1.0 07/13/2008   HGBA1C 5.8 05/17/2017   MICROALBUR 0.50 06/17/2012    Dg Chest 2 View  Result  Date: 09/27/2017 CLINICAL DATA:  Morbid obesity.  Preoperative bariatric surgery EXAM: CHEST  2 VIEW COMPARISON:  June 24, 2017 FINDINGS: Lungs are clear. The heart size and pulmonary vascularity are normal. No adenopathy. No bone lesions. IMPRESSION: No edema or consolidation. Electronically Signed   By: Lowella Grip III M.D.   On: 09/27/2017 09:59   Dg Duanne Limerick  W/kub  Result Date: 09/27/2017 CLINICAL DATA:  Pre bariatric testing for gastric bypass. EXAM: UPPER GI SERIES WITH KUB TECHNIQUE: After obtaining a scout radiograph a routine upper GI series was performed using thin and high density barium. FLUOROSCOPY TIME:  Fluoroscopy Time:  0.8 minutes Radiation Exposure Index (if provided by the fluoroscopic device): 14 mGy Number of Acquired Spot Images: 0 COMPARISON:  None. FINDINGS: KUB: There is no bowel dilatation to suggest obstruction. There is no evidence of pneumoperitoneum, portal venous gas or pneumatosis. There  are no pathologic calcifications along the expected course of the ureters. Prior cholecystectomy. The osseous structures are unremarkable. Upper GI: Examination of the esophagus demonstrated normal esophageal motility. Normal esophageal morphology without evidence of esophagitis or ulceration. No esophageal stricture, diverticula, or mass lesion. No evidence of hiatal hernia. Moderate gastroesophageal reflux. Examination of the stomach demonstrated normal rugal folds and areae gastricae. The gastric mucosa appeared unremarkable without evidence of ulceration, scarring, or mass lesion. Gastric motility and emptying was normal. Fluoroscopic examination of the duodenum demonstrates normal motility and morphology without evidence of ulceration or mass lesion. At the end of the examination a 13 mm barium tablet was administered which transited through the esophagus and esophagogastric junction without delay. IMPRESSION: 1. Moderate gastroesophageal reflux. 2. Otherwise normal upper GI. 3. No  esophageal stricture. At the end of the examination a 13 mm barium tablet was administered which transited through the esophagus and esophagogastric junction without delay. Electronically Signed   By: Kathreen Devoid   On: 09/27/2017 10:50    Assessment & Plan:   Problem List Items Addressed This Visit    Preoperative evaluation to rule out surgical contraindication    Patient  is considered to be at low risk  For perioperative complications  Based on today's exam and the absence of diabetes, heart disease and OSA despite her history of snoring       Morbid obesity ([place])    Prior treatment failures with phentermine and Saxenda due to intolerance.  Seh has bee approved for bariatric surgery and is scheduled for April 22      Generalized anxiety disorder    Continue clonazepam for insomnia.  Changing prozac to wellbutrin for concurrent depression       Relevant Medications   buPROPion (WELLBUTRIN) 100 MG tablet   Facial tic    Minimal, aggravated by anxiety.       Relevant Medications   buPROPion (WELLBUTRIN) 100 MG tablet   clonazePAM (KLONOPIN) 1 MG tablet   Chronic depressive disorder    Trial of wellbutrin with dose titration planned in 2 weeks.       Relevant Medications   buPROPion (WELLBUTRIN) 100 MG tablet      I have changed Baylee Bumpus's clonazePAM. I am also having her start on buPROPion. Additionally, I am having her maintain her cetirizine, levonorgestrel, pramipexole, fluticasone, rizatriptan, diazepam, omeprazole, lactulose, oseltamivir, topiramate, and FLUoxetine.  Meds ordered this encounter  Medications  . buPROPion (WELLBUTRIN) 100 MG tablet    Sig: Take 1 tablet (100 mg total) by mouth 2 (two) times daily. For 2 weeks,  Then increase to 2 tablets twice daily    Dispense:  90 tablet    Refill:  0  . clonazePAM (KLONOPIN) 1 MG tablet    Sig: Take 1 tablet (1 mg total) by mouth at bedtime. 1/2 tablet in morning  2 tablets at night    Dispense:  30  tablet    Refill:  5    Medications Discontinued During This Encounter  Medication Reason  . clonazePAM (KLONOPIN) 0.5 MG tablet     Follow-up: No follow-ups on file.   [name], MD

## 2018-01-14 ENCOUNTER — Telehealth: Payer: Self-pay | Admitting: Registered"

## 2018-01-14 ENCOUNTER — Telehealth: Payer: Self-pay | Admitting: Internal Medicine

## 2018-01-14 ENCOUNTER — Encounter: Payer: Self-pay | Admitting: Internal Medicine

## 2018-01-14 NOTE — Telephone Encounter (Signed)
Copied from [place] [phone]. Topic: Quick Communication - Rx Refill/Question >> Jan 14, 2018  5:52 PM Marin Olp L wrote: Medication: clonazePAM (KLONOPIN) 1 MG tablet  Has the patient contacted their pharmacy? Yes.   (Agent: If no, request that the patient contact the pharmacy for the refill.) Preferred Pharmacy (with phone number or street name): [id number] [place], [address] [place] [address] Phone: [phone] Fax: [phone] Agent: Please be advised that RX refills may take up to 3 business days. We ask that you follow-up with your pharmacy.  Pharmacy needs clarification on instructions.

## 2018-01-15 ENCOUNTER — Other Ambulatory Visit: Payer: Self-pay | Admitting: Surgery

## 2018-01-15 DIAGNOSIS — F32A Depression, unspecified: Secondary | ICD-10-CM | POA: Insufficient documentation

## 2018-01-15 DIAGNOSIS — F329 Major depressive disorder, single episode, unspecified: Secondary | ICD-10-CM | POA: Insufficient documentation

## 2018-01-15 NOTE — Assessment & Plan Note (Signed)
Minimal, aggravated by anxiety.

## 2018-01-15 NOTE — Assessment & Plan Note (Signed)
Trial of wellbutrin with dose titration planned in 2 weeks.

## 2018-01-15 NOTE — Assessment & Plan Note (Signed)
Continue clonazepam for insomnia.  Changing prozac to wellbutrin for concurrent depression

## 2018-01-15 NOTE — Assessment & Plan Note (Signed)
Prior treatment failures with phentermine and Saxenda due to intolerance.  Seh has bee approved for bariatric surgery and is scheduled for April 22

## 2018-01-15 NOTE — Assessment & Plan Note (Signed)
Patient  is considered to be at low risk  For perioperative complications  Based on today's exam and the absence of diabetes, heart disease and OSA despite her history of snoring

## 2018-01-17 ENCOUNTER — Other Ambulatory Visit: Payer: Self-pay | Admitting: Internal Medicine

## 2018-01-17 MED ORDER — CLONAZEPAM 1 MG PO TABS: 1.0000 mg | ORAL_TABLET | Freq: Every day | ORAL | 5 refills | Status: DC

## 2018-01-17 NOTE — Telephone Encounter (Signed)
Please advise 

## 2018-01-18 NOTE — Progress Notes (Signed)
LOV DR Helene Kelp TULLO 01-12-18 Epic   LOV CARDIOLOGY DR Ida Rogue 10-27-17 Epic   EKG 10-27-17 Epic   CXR 09-27-17 Epic

## 2018-01-18 NOTE — Telephone Encounter (Signed)
Spoke with pharmacy and they stated that they are not sure what is needing clarification on this prescription. Pharmacy tech stated that he had all the information needed to fill this rx.

## 2018-01-18 NOTE — Patient Instructions (Addendum)
Selena [name]  01/18/2018   Your procedure is scheduled on: 01-24-18   Report to [facility] Main  Entrance    Report to admitting at 5:30AM    Call this number if you have problems the morning of surgery [phone]     Remember: NO SOLID FOOD AFTER 600 PM THE NIGHT BEFORE YOUR SURGERY. YOU MAY DRINK CLEAR FLUIDS. MORNING OF SURGERY DRINK:  1SHAKE BEFORE YOU LEAVE HOME, DRINK ALL OF THE SHAKE AT ONE TIME. THE SHAKE YOU DRINK BEFORE YOU LEAVE HOME WILL BE THE LAST FLUIDS YOU DRINK BEFORE SURGERY.     Take these medicines the morning of surgery with A SIP OF WATER: BUPROPION(WELLBUTRIN), OMEPRAZOLE, DIAZEPAM IF NEEDED, ZYRTEC IF NEEDED; fluoxetine                                 You may not have any metal on your body including hair pins and              piercings  Do not wear jewelry, make-up, lotions, powders or perfumes, deodorant             Do not wear nail polish.  Do not shave  48 hours prior to surgery.     Do not bring valuables to the hospital. [place].  Contacts, dentures or bridgework may not be worn into surgery.  Leave suitcase in the car. After surgery it may be brought to your room.                 Please read over the following fact sheets you were given: _____________________________________________________________________    PAIN IS EXPECTED AFTER SURGERY AND WILL NOT BE COMPLETELY ELIMINATED. AMBULATION AND TYLENOL WILL HELP REDUCE INCISIONAL AND GAS PAIN. MOVEMENT IS KEY!  YOU ARE EXPECTED TO BE OUT OF BED WITHIN 4 HOURS OF ADMISSION TO YOUR PATIENT ROOM.  SITTING IN THE RECLINER THROUGHOUT THE DAY IS IMPORTANT FOR DRINKING FLUIDS AND MOVING GAS THROUGHOUT THE GI TRACT.  COMPRESSION STOCKINGS SHOULD BE WORN [place] UNLESS YOU ARE WALKING.   INCENTIVE SPIROMETER SHOULD BE USED EVERY HOUR WHILE AWAKE TO DECREASE POST-OPERATIVE COMPLICATIONS SUCH AS PNEUMONIA.  WHEN  DISCHARGED HOME, IT IS IMPORTANT TO CONTINUE TO WALK EVERY HOUR AND USE THE INCENTIVE SPIROMETER EVERY HOUR.        [facility] - Preparing for Surgery Before surgery, you can play an important role.  Because skin is not sterile, your skin needs to be as free of germs as possible.  You can reduce the number of germs on your skin by washing with CHG (chlorahexidine gluconate) soap before surgery.  CHG is an antiseptic cleaner which kills germs and bonds with the skin to continue killing germs even after washing. Please DO NOT use if you have an allergy to CHG or antibacterial soaps.  If your skin becomes reddened/irritated stop using the CHG and inform your nurse when you arrive at Short Stay. Do not shave (including legs and underarms) for at least 48 hours prior to the first CHG shower.  You may shave your face/neck. Please follow these instructions carefully:  1.  Shower with CHG Soap the night before surgery and the  morning of Surgery.  2.  If you choose to wash your hair, wash your hair first as  usual with your  normal  shampoo.  3.  After you shampoo, rinse your hair and body thoroughly to remove the  shampoo.                           4.  Use CHG as you would any other liquid soap.  You can apply chg directly  to the skin and wash                       Gently with a scrungie or clean washcloth.  5.  Apply the CHG Soap to your body ONLY FROM THE NECK DOWN.   Do not use on face/ open                           Wound or open sores. Avoid contact with eyes, ears mouth and genitals (private parts).                       Wash face,  Genitals (private parts) with your normal soap.             6.  Wash thoroughly, paying special attention to the area where your surgery  will be performed.  7.  Thoroughly rinse your body with warm water from the neck down.  8.  DO NOT shower/wash with your normal soap after using and rinsing off  the CHG Soap.                9.  Pat yourself dry with a clean  towel.            10.  Wear clean pajamas.            11.  Place clean sheets on your bed the night of your first shower and do not  sleep with pets. Day of Surgery : Do not apply any lotions/deodorants the morning of surgery.  Please wear clean clothes to the hospital/surgery center.  FAILURE TO FOLLOW THESE INSTRUCTIONS MAY RESULT IN THE CANCELLATION OF YOUR SURGERY PATIENT SIGNATURE_________________________________  NURSE SIGNATURE__________________________________  ________________________________________________________________________   Adam Phenix  An incentive spirometer is a tool that can help keep your lungs clear and active. This tool measures how well you are filling your lungs with each breath. Taking long deep breaths may help reverse or decrease the chance of developing breathing (pulmonary) problems (especially infection) following:  A long period of time when you are unable to move or be active. BEFORE THE PROCEDURE   If the spirometer includes an indicator to show your best effort, your nurse or respiratory therapist will set it to a desired goal.  If possible, sit up straight or lean slightly forward. Try not to slouch.  Hold the incentive spirometer in an upright position. INSTRUCTIONS FOR USE  1. Sit on the edge of your bed if possible, or sit up as far as you can in bed or on a chair. 2. Hold the incentive spirometer in an upright position. 3. Breathe out normally. 4. Place the mouthpiece in your mouth and seal your lips tightly around it. 5. Breathe in slowly and as deeply as possible, raising the piston or the ball toward the top of the column. 6. Hold your breath for 3-5 seconds or for as long as possible. Allow the piston or ball to fall to the bottom of the column. 7. Remove the mouthpiece from  your mouth and breathe out normally. 8. Rest for a few seconds and repeat Steps 1 through 7 at least 10 times every 1-2 hours when you are awake. Take your  time and take a few normal breaths between deep breaths. 9. The spirometer may include an indicator to show your best effort. Use the indicator as a goal to work toward during each repetition. 10. After each set of 10 deep breaths, practice coughing to be sure your lungs are clear. If you have an incision (the cut made at the time of surgery), support your incision when coughing by placing a pillow or rolled up towels firmly against it. Once you are able to get out of bed, walk around indoors and cough well. You may stop using the incentive spirometer when instructed by your caregiver.  RISKS AND COMPLICATIONS  Take your time so you do not get dizzy or light-headed.  If you are in pain, you may need to take or ask for pain medication before doing incentive spirometry. It is harder to take a deep breath if you are having pain. AFTER USE  Rest and breathe slowly and easily.  It can be helpful to keep track of a log of your progress. Your caregiver can provide you with a simple table to help with this. If you are using the spirometer at home, follow these instructions: [place] IF:   You are having difficultly using the spirometer.  You have trouble using the spirometer as often as instructed.  Your pain medication is not giving enough relief while using the spirometer.  You develop fever of 100.5 F (38.1 C) or higher. SEEK IMMEDIATE MEDICAL CARE IF:   You cough up bloody sputum that had not been present before.  You develop fever of 102 F (38.9 C) or greater.  You develop worsening pain at or near the incision site. MAKE SURE YOU:   Understand these instructions.  Will watch your condition.  Will get help right away if you are not doing well or get worse. Document Released: 02/01/2007 Document Revised: 12/14/2011 Document Reviewed: 04/04/2007 ExitCare Patient Information 2014 ExitCare,  [place].   ________________________________________________________________________                   CLEAR LIQUID DIET   Foods Allowed                                                                     Foods Excluded  Coffee and tea, regular and decaf                             liquids that you cannot  Plain Jell-O in any flavor                                             see through such as: Fruit ices (not with fruit pulp)  milk, soups, orange juice  Iced Popsicles                                    All solid food Carbonated beverages, regular and diet                                    Cranberry, grape and apple juices Sports drinks like Gatorade Lightly seasoned clear broth or consume(fat free) Sugar, honey syrup  Sample Menu Breakfast                                Lunch                                     Supper Cranberry juice                    Beef broth                            Chicken broth Jell-O                                     Grape juice                           Apple juice Coffee or tea                        Jell-O                                      Popsicle                                                Coffee or tea                        Coffee or tea  _____________________________________________________________________

## 2018-01-19 ENCOUNTER — Encounter (HOSPITAL_COMMUNITY): Payer: Self-pay

## 2018-01-19 ENCOUNTER — Other Ambulatory Visit: Payer: Self-pay

## 2018-01-19 ENCOUNTER — Encounter (HOSPITAL_COMMUNITY)
Admission: RE | Admit: 2018-01-19 | Discharge: 2018-01-19 | Disposition: A | Discharge status: Home / Self Care | Payer: BC Managed Care – PPO | Source: Ambulatory Visit | Attending: Surgery | Admitting: Surgery

## 2018-01-19 DIAGNOSIS — E669 Obesity, unspecified: Secondary | ICD-10-CM | POA: Insufficient documentation

## 2018-01-19 DIAGNOSIS — Z01812 Encounter for preprocedural laboratory examination: Secondary | ICD-10-CM | POA: Diagnosis present

## 2018-01-19 DIAGNOSIS — K219 Gastro-esophageal reflux disease without esophagitis: Secondary | ICD-10-CM | POA: Insufficient documentation

## 2018-01-19 HISTORY — DX: Other complications of anesthesia, initial encounter: T88.59XA

## 2018-01-19 HISTORY — DX: Adverse effect of unspecified anesthetic, initial encounter: T41.45XA

## 2018-01-19 LAB — CBC WITH DIFFERENTIAL/PLATELET
Basophils Absolute: 0.1 10*3/uL (ref 0.0–0.1)
Basophils Relative: 1 %
Eosinophils Absolute: 0.1 10*3/uL (ref 0.0–0.7)
Eosinophils Relative: 1 %
HCT: 40.3 % (ref 36.0–46.0)
Hemoglobin: 13.5 g/dL (ref 12.0–15.0)
Lymphocytes Relative: 31 %
Lymphs Abs: 2.7 10*3/uL (ref 0.7–4.0)
MCH: 30.9 pg (ref 26.0–34.0)
MCHC: 33.5 g/dL (ref 30.0–36.0)
MCV: 92.2 fL (ref 78.0–100.0)
Monocytes Absolute: 0.5 10*3/uL (ref 0.1–1.0)
Monocytes Relative: 5 %
Neutro Abs: 5.3 10*3/uL (ref 1.7–7.7)
Neutrophils Relative %: 62 %
Platelets: 353 10*3/uL (ref 150–400)
RBC: 4.37 MIL/uL (ref 3.87–5.11)
RDW: 12.6 % (ref 11.5–15.5)
WBC: 8.7 10*3/uL (ref 4.0–10.5)

## 2018-01-19 LAB — COMPREHENSIVE METABOLIC PANEL
ALT: 51 U/L (ref 14–54)
AST: 31 U/L (ref 15–41)
Albumin: 4.1 g/dL (ref 3.5–5.0)
Alkaline Phosphatase: 59 U/L (ref 38–126)
Anion gap: 9 (ref 5–15)
BUN: 19 mg/dL (ref 6–20)
CO2: 20 mmol/L — ABNORMAL LOW (ref 22–32)
Calcium: 9.3 mg/dL (ref 8.9–10.3)
Chloride: 106 mmol/L (ref 101–111)
Creatinine, Ser: 0.85 mg/dL (ref 0.44–1.00)
GFR calc Af Amer: >60 mL/min (ref >60)
GFR calc non Af Amer: >60 mL/min (ref >60)
Glucose, Bld: 90 mg/dL (ref 65–99)
Potassium: 3.6 mmol/L (ref 3.5–5.1)
Sodium: 135 mmol/L (ref 135–145)
Total Bilirubin: 0.6 mg/dL (ref 0.3–1.2)
Total Protein: 7.5 g/dL (ref 6.5–8.1)

## 2018-01-20 LAB — ABO/RH: ABO/RH(D): A POS

## 2018-01-22 NOTE — Anesthesia Preprocedure Evaluation (Addendum)
Anesthesia Evaluation  Patient identified by MRN, date of birth, ID band Patient awake    Reviewed: Allergy & Precautions, H&P , NPO status , Patient's Chart, lab work & pertinent test results  Airway Mallampati: II  TM Distance: >3 FB Neck ROM: Full    Dental no notable dental hx. (+) Teeth Intact, Dental Advisory Given   Pulmonary asthma ,    Pulmonary exam normal breath sounds clear to auscultation       Cardiovascular Exercise Tolerance: Good hypertension, On Medications  Rhythm:Regular Rate:Normal     Neuro/Psych  Headaches, Anxiety Depression negative psych ROS   GI/Hepatic negative GI ROS, Neg liver ROS,   Endo/Other  Morbid obesity  Renal/GU negative Renal ROS  negative genitourinary   Musculoskeletal  (+) Fibromyalgia -  Abdominal   Peds  Hematology  (+) anemia ,   Anesthesia Other Findings   Reproductive/Obstetrics negative OB ROS                            Anesthesia Physical Anesthesia Plan  ASA: III  Anesthesia Plan: General   Post-op Pain Management:    Induction: Intravenous  PONV Risk Score and Plan: 4 or greater and Ondansetron, Dexamethasone and Midazolam  Airway Management Planned: Oral ETT  Additional Equipment:   Intra-op Plan:   Post-operative Plan: Extubation in OR  Informed Consent: I have reviewed the patients History and Physical, chart, labs and discussed the procedure including the risks, benefits and alternatives for the proposed anesthesia with the patient or authorized representative who has indicated his/her understanding and acceptance.   Dental advisory given  Plan Discussed with: CRNA  Anesthesia Plan Comments:         Anesthesia Quick Evaluation

## 2018-01-23 NOTE — H&P (Signed)
Selena [name]  Documented: 01/14/2018 4:24 PM  Location: [facility]  Patient #: [id number]  DOB: [birth year]  Divorced / Language: Cleophus Molt / Race: White  Female      History of Present Illness Rodman Key B. Hassell Done MD; 01/14/2018 4:53 PM)  The patient is a 52 year old female who presents for a bariatric surgery evaluation. Ms. Ryer has had good rapport with Dr. Ardath Sax and is on the preop diet. Her weight today is 236 and her BMI is 43. Her UGI showed reflux but no hiatal hernia so we may look for occult HH and fix it when we are there. She is set for her roux Y gastric bypass on April 22.       Allergies Malachi Bonds, CMA; 01/14/2018 4:25 PM)  Sulfa Antibiotics     Medication History Malachi Bonds, CMA; 01/14/2018 4:25 PM)  DiazePAM (10MG  Tablet, Oral) Active.  Rizatriptan Benzoate (10MG  Tablet, Oral) Active.  Topiramate (100MG  Tablet, Oral) Active.  Pramipexole Dihydrochloride (0.125MG  Tablet, Oral) Active.  BuPROPion HCl ER (SR) (100MG  Tablet ER 12HR, Oral) Active.  FLUoxetine HCl (20MG  Tablet, Oral) Active.  Omeprazole (20MG  Capsule DR, Oral) Active.  Medications Reconciled    Vitals (Chemira Jones CMA; 01/14/2018 4:25 PM)  01/14/2018 4:25 PM  Weight: 236.8 lb Height: 62in  Body Surface Area: 2.05 m Body Mass Index: 43.31 kg/m   Temp.: 98.25F(Oral)   BP: 120/74 (Sitting, Left Arm, Standard)              Physical Exam (Randolf Sansoucie B. Hassell Done MD; 01/14/2018 4:54 PM)  Eldridge Dace unremarkaable Neck supple Cardiovascular  SR without murmurs or gallops  Chest clear     Abdomen : only prior lap chole post partum      Assessment & Plan Rodman Key B. Hassell Done MD; 01/14/2018 4:54 PM)  MORBID OBESITY, UNSPECIFIED OBESITY TYPE (E66.01) BMI 46 For roux en Y gastric bypass surgery.  Informed consent obtain in the office and questions will be answered the morning of surgery.  Story: Two weeks prior to surgery  Go on the  extremely low carb liquid diet  One week prior to surgery  No aspirin products. Tylenol is acceptable  Stop smoking  24 hours prior to surgery  No alcoholic beverages  Report fever greater than 100.5 or excessive nasal drainage suggesting infection  Continue bariatric preop diet  Perform bowel prep if ordered  Do not eat or drink anything after midnight the night before surgery  Do not take any medications except those instructed by the anesthesiologist  Morning of surgery  Please arrive at the hospital at least 2 hours before your scheduled surgery time.  No makeup, fingernail polish or jewelry  Bring insurance cards with you  Bring your CPAP mask if you use this  Impression: Ms. Detter is a well informed patient that has tried multimodality weight loss without success including behavior modification, drugs, exercise, and caloric restriction. She is a good candidate for roux en Y gastric bypass. Will proceed April 22.  Current Plans  Started OxyCODONE HCl 5 MG/5ML Oral Solution, 5-10 Milliliter every four hours, as needed, 120 Milliliter, 01/14/2018, No Refill.  Started Pantoprazole Sodium 40 MG Oral Tablet Delayed Release, 1 (one) Tablet daily, #30, 01/14/2018, Ref. x2.  Started Ondansetron 4 MG Oral Tablet Disintegrating, 1 (one) Tablet every eight hours, as needed, #20, 01/14/2018, No Refill.   Matt B. Hassell Done, MD, FACS

## 2018-01-24 ENCOUNTER — Inpatient Hospital Stay (HOSPITAL_COMMUNITY): Payer: BC Managed Care – PPO | Admitting: Anesthesiology

## 2018-01-24 ENCOUNTER — Other Ambulatory Visit: Payer: Self-pay

## 2018-01-24 ENCOUNTER — Inpatient Hospital Stay (HOSPITAL_COMMUNITY)
Admission: RE | Admit: 2018-01-24 | Discharge: 2018-01-25 | DRG: 621 | Disposition: A | Discharge status: Home / Self Care | Payer: BC Managed Care – PPO | Source: Ambulatory Visit | Attending: Surgery | Admitting: Surgery

## 2018-01-24 ENCOUNTER — Encounter (HOSPITAL_COMMUNITY)
Admission: RE | Disposition: A | Discharge status: Home / Self Care | Payer: Self-pay | Source: Ambulatory Visit | Attending: Surgery

## 2018-01-24 ENCOUNTER — Encounter (HOSPITAL_COMMUNITY): Payer: Self-pay

## 2018-01-24 DIAGNOSIS — J45909 Unspecified asthma, uncomplicated: Secondary | ICD-10-CM | POA: Diagnosis present

## 2018-01-24 DIAGNOSIS — I10 Essential (primary) hypertension: Secondary | ICD-10-CM | POA: Diagnosis present

## 2018-01-24 DIAGNOSIS — Z79899 Other long term (current) drug therapy: Secondary | ICD-10-CM

## 2018-01-24 DIAGNOSIS — M797 Fibromyalgia: Secondary | ICD-10-CM | POA: Diagnosis present

## 2018-01-24 DIAGNOSIS — Z6841 Body Mass Index (BMI) 40.0 and over, adult: Secondary | ICD-10-CM | POA: Diagnosis not present

## 2018-01-24 DIAGNOSIS — Z9884 Bariatric surgery status: Secondary | ICD-10-CM

## 2018-01-24 DIAGNOSIS — K219 Gastro-esophageal reflux disease without esophagitis: Secondary | ICD-10-CM | POA: Diagnosis present

## 2018-01-24 HISTORY — PX: GASTRIC ROUX-EN-Y: SHX5262

## 2018-01-24 LAB — CBC
HCT: 39.8 % (ref 36.0–46.0)
Hemoglobin: 13.3 g/dL (ref 12.0–15.0)
MCH: 31.3 pg (ref 26.0–34.0)
MCHC: 33.4 g/dL (ref 30.0–36.0)
MCV: 93.6 fL (ref 78.0–100.0)
Platelets: 280 10*3/uL (ref 150–400)
RBC: 4.25 MIL/uL (ref 3.87–5.11)
RDW: 13 % (ref 11.5–15.5)
WBC: 15.1 10*3/uL — ABNORMAL HIGH (ref 4.0–10.5)

## 2018-01-24 LAB — TYPE AND SCREEN
ABO/RH(D): A POS
Antibody Screen: NEGATIVE

## 2018-01-24 LAB — CREATININE, SERUM
Creatinine, Ser: 0.9 mg/dL (ref 0.44–1.00)
GFR calc Af Amer: >60 mL/min (ref >60)
GFR calc non Af Amer: >60 mL/min (ref >60)

## 2018-01-24 LAB — PREGNANCY, URINE: Preg Test, Ur: NEGATIVE

## 2018-01-24 SURGERY — LAPAROSCOPIC ROUX-EN-Y GASTRIC BYPASS WITH UPPER ENDOSCOPY
Anesthesia: General | Site: Abdomen

## 2018-01-24 MED ORDER — KETAMINE HCL 10 MG/ML IJ SOLN
INTRAMUSCULAR | Status: AC
  Filled 2018-01-24: qty 1

## 2018-01-24 MED ORDER — MIDAZOLAM HCL 2 MG/2ML IJ SOLN
INTRAMUSCULAR | Status: AC
  Filled 2018-01-24: qty 2

## 2018-01-24 MED ORDER — MORPHINE SULFATE (PF) 2 MG/ML IV SOLN: 1.0000 mg | INTRAVENOUS | Status: DC | PRN

## 2018-01-24 MED ORDER — HEPARIN SODIUM (PORCINE) 5000 UNIT/ML IJ SOLN
5000.0000 [IU] | Freq: Three times a day (TID) | INTRAMUSCULAR | Status: DC
  Administered 2018-01-24 – 2018-01-25 (×4): 5000 [IU] via SUBCUTANEOUS
  Filled 2018-01-24 (×4): qty 1

## 2018-01-24 MED ORDER — FENTANYL CITRATE (PF) 250 MCG/5ML IJ SOLN
INTRAMUSCULAR | Status: AC
  Filled 2018-01-24: qty 5

## 2018-01-24 MED ORDER — OXYCODONE HCL 5 MG/5ML PO SOLN: 5.0000 mg | ORAL | Status: DC | PRN

## 2018-01-24 MED ORDER — APREPITANT 40 MG PO CAPS
40.0000 mg | ORAL_CAPSULE | ORAL | Status: AC
  Administered 2018-01-24: 40 mg via ORAL
  Filled 2018-01-24: qty 1

## 2018-01-24 MED ORDER — CHLORHEXIDINE GLUCONATE CLOTH 2 % EX PADS: 6.0000 | MEDICATED_PAD | Freq: Once | CUTANEOUS | Status: DC

## 2018-01-24 MED ORDER — GABAPENTIN 300 MG PO CAPS
300.0000 mg | ORAL_CAPSULE | ORAL | Status: AC
  Administered 2018-01-24: 300 mg via ORAL
  Filled 2018-01-24: qty 1

## 2018-01-24 MED ORDER — ONDANSETRON HCL 4 MG/2ML IJ SOLN
INTRAMUSCULAR | Status: AC
  Filled 2018-01-24: qty 2

## 2018-01-24 MED ORDER — MIDAZOLAM HCL 5 MG/5ML IJ SOLN
INTRAMUSCULAR | Status: DC | PRN
  Administered 2018-01-24 (×2): 1 mg via INTRAVENOUS

## 2018-01-24 MED ORDER — SUGAMMADEX SODIUM 500 MG/5ML IV SOLN
INTRAVENOUS | Status: AC
  Filled 2018-01-24: qty 5

## 2018-01-24 MED ORDER — EPHEDRINE SULFATE-NACL 50-0.9 MG/10ML-% IV SOSY
PREFILLED_SYRINGE | INTRAVENOUS | Status: DC | PRN
  Administered 2018-01-24: 15 mg via INTRAVENOUS
  Administered 2018-01-24: 10 mg via INTRAVENOUS

## 2018-01-24 MED ORDER — CEFOTETAN DISODIUM-DEXTROSE 2-2.08 GM-%(50ML) IV SOLR
2.0000 g | INTRAVENOUS | Status: AC
  Administered 2018-01-24: 2 g via INTRAVENOUS
  Filled 2018-01-24: qty 50

## 2018-01-24 MED ORDER — LIDOCAINE 2% (20 MG/ML) 5 ML SYRINGE
INTRAMUSCULAR | Status: DC | PRN
  Administered 2018-01-24: 1.5 mg/kg/h via INTRAVENOUS

## 2018-01-24 MED ORDER — PROMETHAZINE HCL 25 MG/ML IJ SOLN
INTRAMUSCULAR | Status: AC
  Filled 2018-01-24: qty 1

## 2018-01-24 MED ORDER — BUPIVACAINE LIPOSOME 1.3 % IJ SUSP
20.0000 mL | Freq: Once | INTRAMUSCULAR | Status: AC
  Administered 2018-01-24: 20 mL
  Filled 2018-01-24: qty 20

## 2018-01-24 MED ORDER — SIMETHICONE 80 MG PO CHEW
80.0000 mg | CHEWABLE_TABLET | Freq: Four times a day (QID) | ORAL | Status: DC | PRN
  Administered 2018-01-25: 80 mg via ORAL
  Filled 2018-01-24: qty 1

## 2018-01-24 MED ORDER — SCOPOLAMINE 1 MG/3DAYS TD PT72
1.0000 | MEDICATED_PATCH | TRANSDERMAL | Status: DC
  Administered 2018-01-24: 1.5 mg via TRANSDERMAL
  Filled 2018-01-24: qty 1

## 2018-01-24 MED ORDER — ACETAMINOPHEN 160 MG/5ML PO SOLN
650.0000 mg | Freq: Four times a day (QID) | ORAL | Status: DC
  Administered 2018-01-24 – 2018-01-25 (×4): 650 mg via ORAL
  Filled 2018-01-24 (×4): qty 20.3

## 2018-01-24 MED ORDER — ACETAMINOPHEN 500 MG PO TABS
1000.0000 mg | ORAL_TABLET | ORAL | Status: AC
  Administered 2018-01-24: 1000 mg via ORAL
  Filled 2018-01-24: qty 2

## 2018-01-24 MED ORDER — PROPOFOL 10 MG/ML IV BOLUS
INTRAVENOUS | Status: DC | PRN
  Administered 2018-01-24: 150 mg via INTRAVENOUS

## 2018-01-24 MED ORDER — LACTATED RINGERS IR SOLN
Status: DC | PRN
  Administered 2018-01-24: 1000 mL

## 2018-01-24 MED ORDER — SODIUM CHLORIDE 0.9 % IJ SOLN
INTRAMUSCULAR | Status: DC | PRN
  Administered 2018-01-24: 10 mL

## 2018-01-24 MED ORDER — SUCCINYLCHOLINE CHLORIDE 200 MG/10ML IV SOSY
PREFILLED_SYRINGE | INTRAVENOUS | Status: AC
  Filled 2018-01-24: qty 10

## 2018-01-24 MED ORDER — DEXAMETHASONE SODIUM PHOSPHATE 4 MG/ML IJ SOLN: 4.0000 mg | INTRAMUSCULAR | Status: DC

## 2018-01-24 MED ORDER — FAMOTIDINE IN NACL 20-0.9 MG/50ML-% IV SOLN
20.0000 mg | Freq: Two times a day (BID) | INTRAVENOUS | Status: DC
  Administered 2018-01-24 – 2018-01-25 (×3): 20 mg via INTRAVENOUS
  Filled 2018-01-24 (×3): qty 50

## 2018-01-24 MED ORDER — HYDROMORPHONE HCL 1 MG/ML IJ SOLN
INTRAMUSCULAR | Status: AC
  Filled 2018-01-24: qty 1

## 2018-01-24 MED ORDER — LIDOCAINE HCL 2 % IJ SOLN
INTRAMUSCULAR | Status: AC
  Filled 2018-01-24: qty 20

## 2018-01-24 MED ORDER — HYDROMORPHONE HCL 1 MG/ML IJ SOLN: 0.2500 mg | INTRAMUSCULAR | Status: DC | PRN

## 2018-01-24 MED ORDER — PROMETHAZINE HCL 25 MG/ML IJ SOLN
6.2500 mg | INTRAMUSCULAR | Status: DC | PRN
  Administered 2018-01-24: 6.25 mg via INTRAVENOUS

## 2018-01-24 MED ORDER — 0.9 % SODIUM CHLORIDE (POUR BTL) OPTIME
TOPICAL | Status: DC | PRN
  Administered 2018-01-24: 1000 mL

## 2018-01-24 MED ORDER — ONDANSETRON HCL 4 MG/2ML IJ SOLN: 4.0000 mg | INTRAMUSCULAR | Status: DC | PRN

## 2018-01-24 MED ORDER — ONDANSETRON HCL 4 MG/2ML IJ SOLN
INTRAMUSCULAR | Status: DC | PRN
  Administered 2018-01-24: 4 mg via INTRAVENOUS

## 2018-01-24 MED ORDER — LIDOCAINE 2% (20 MG/ML) 5 ML SYRINGE
INTRAMUSCULAR | Status: AC
  Filled 2018-01-24: qty 5

## 2018-01-24 MED ORDER — PREMIER PROTEIN SHAKE
2.0000 [oz_av] | ORAL | Status: DC
  Administered 2018-01-25: 2 [oz_av] via ORAL

## 2018-01-24 MED ORDER — SODIUM CHLORIDE 0.9 % IJ SOLN
INTRAMUSCULAR | Status: AC
  Filled 2018-01-24: qty 10

## 2018-01-24 MED ORDER — PROPOFOL 10 MG/ML IV BOLUS
INTRAVENOUS | Status: AC
  Filled 2018-01-24: qty 40

## 2018-01-24 MED ORDER — ROCURONIUM BROMIDE 50 MG/5ML IV SOSY
PREFILLED_SYRINGE | INTRAVENOUS | Status: DC | PRN
  Administered 2018-01-24: 10 mg via INTRAVENOUS
  Administered 2018-01-24: 50 mg via INTRAVENOUS
  Administered 2018-01-24 (×2): 10 mg via INTRAVENOUS

## 2018-01-24 MED ORDER — LACTATED RINGERS IV SOLN
INTRAVENOUS | Status: DC
  Administered 2018-01-24: 10:00:00 via INTRAVENOUS
  Administered 2018-01-24: 1000 mL via INTRAVENOUS
  Administered 2018-01-24: 11:00:00 via INTRAVENOUS

## 2018-01-24 MED ORDER — DEXAMETHASONE SODIUM PHOSPHATE 10 MG/ML IJ SOLN
INTRAMUSCULAR | Status: AC
  Filled 2018-01-24: qty 1

## 2018-01-24 MED ORDER — FENTANYL CITRATE (PF) 250 MCG/5ML IJ SOLN
INTRAMUSCULAR | Status: DC | PRN
  Administered 2018-01-24 (×5): 50 ug via INTRAVENOUS

## 2018-01-24 MED ORDER — CELECOXIB 200 MG PO CAPS
400.0000 mg | ORAL_CAPSULE | ORAL | Status: AC
  Administered 2018-01-24: 400 mg via ORAL
  Filled 2018-01-24: qty 2

## 2018-01-24 MED ORDER — KCL IN DEXTROSE-NACL 20-5-0.45 MEQ/L-%-% IV SOLN
INTRAVENOUS | Status: DC
  Administered 2018-01-24 – 2018-01-25 (×2): via INTRAVENOUS
  Filled 2018-01-24 (×2): qty 1000

## 2018-01-24 MED ORDER — GABAPENTIN 100 MG PO CAPS
200.0000 mg | ORAL_CAPSULE | Freq: Two times a day (BID) | ORAL | Status: DC
  Administered 2018-01-24 – 2018-01-25 (×3): 200 mg via ORAL
  Filled 2018-01-24 (×4): qty 2

## 2018-01-24 MED ORDER — EPHEDRINE 5 MG/ML INJ
INTRAVENOUS | Status: AC
  Filled 2018-01-24: qty 10

## 2018-01-24 MED ORDER — SUCCINYLCHOLINE CHLORIDE 200 MG/10ML IV SOSY
PREFILLED_SYRINGE | INTRAVENOUS | Status: DC | PRN
  Administered 2018-01-24: 100 mg via INTRAVENOUS

## 2018-01-24 MED ORDER — HYDRALAZINE HCL 20 MG/ML IJ SOLN: 10.0000 mg | INTRAMUSCULAR | Status: DC | PRN

## 2018-01-24 MED ORDER — ROCURONIUM BROMIDE 10 MG/ML (PF) SYRINGE
PREFILLED_SYRINGE | INTRAVENOUS | Status: AC
  Filled 2018-01-24: qty 5

## 2018-01-24 MED ORDER — TISSEEL VH 10 ML EX KIT
PACK | CUTANEOUS | Status: AC
  Filled 2018-01-24: qty 2

## 2018-01-24 MED ORDER — DEXAMETHASONE SODIUM PHOSPHATE 10 MG/ML IJ SOLN
INTRAMUSCULAR | Status: DC | PRN
  Administered 2018-01-24: 5 mg via INTRAVENOUS

## 2018-01-24 MED ORDER — LIDOCAINE 2% (20 MG/ML) 5 ML SYRINGE
INTRAMUSCULAR | Status: DC | PRN
  Administered 2018-01-24: 80 mg via INTRAVENOUS

## 2018-01-24 MED ORDER — TISSEEL VH 10 ML EX KIT
PACK | CUTANEOUS | Status: DC | PRN
  Administered 2018-01-24: 1

## 2018-01-24 MED ORDER — HEPARIN SODIUM (PORCINE) 5000 UNIT/ML IJ SOLN
5000.0000 [IU] | INTRAMUSCULAR | Status: AC
  Administered 2018-01-24: 5000 [IU] via SUBCUTANEOUS
  Filled 2018-01-24: qty 1

## 2018-01-24 MED ORDER — KETAMINE HCL 10 MG/ML IJ SOLN
INTRAMUSCULAR | Status: DC | PRN
  Administered 2018-01-24: 30 mg via INTRAVENOUS

## 2018-01-24 SURGICAL SUPPLY — 75 items
ADH SKN CLS APL DERMABOND .7 (GAUZE/BANDAGES/DRESSINGS) ×1
APL SRG 32X5 SNPLK LF DISP (MISCELLANEOUS) ×1
APPLIER CLIP ROT 10 11.4 M/L (STAPLE)
APPLIER CLIP ROT 13.4 12 LRG (CLIP)
APR CLP LRG 13.4X12 ROT 20 MLT (CLIP)
APR CLP MED LRG 11.4X10 (STAPLE)
BLADE SURG 15 STRL LF DISP TIS (BLADE) ×1 IMPLANT
BLADE SURG 15 STRL SS (BLADE) ×3
CABLE HIGH FREQUENCY MONO STRZ (ELECTRODE) ×2 IMPLANT
CLIP APPLIE ROT 10 11.4 M/L (STAPLE) IMPLANT
CLIP APPLIE ROT 13.4 12 LRG (CLIP) IMPLANT
CLIP SUT LAPRA TY ABSORB (SUTURE) ×5 IMPLANT
DERMABOND ADVANCED (GAUZE/BANDAGES/DRESSINGS) ×2
DERMABOND ADVANCED .7 DNX12 (GAUZE/BANDAGES/DRESSINGS) IMPLANT
DEVICE SUT QUICK LOAD TK 5 (STAPLE) IMPLANT
DEVICE SUT TI-KNOT TK 5X26 (MISCELLANEOUS) IMPLANT
DEVICE SUTURE ENDOST 10MM (ENDOMECHANICALS) ×3 IMPLANT
DEVICE TI KNOT TK5 (MISCELLANEOUS)
DISSECTOR BLUNT TIP ENDO 5MM (MISCELLANEOUS) IMPLANT
DRAIN PENROSE 18X1/4 LTX STRL (WOUND CARE) ×3 IMPLANT
GAUZE SPONGE 4X4 12PLY STRL (GAUZE/BANDAGES/DRESSINGS) IMPLANT
GAUZE SPONGE 4X4 16PLY XRAY LF (GAUZE/BANDAGES/DRESSINGS) ×3 IMPLANT
GLOVE BIOGEL M 8.0 STRL (GLOVE) ×3 IMPLANT
GOWN STRL REUS W/TWL XL LVL3 (GOWN DISPOSABLE) ×12 IMPLANT
HANDLE STAPLE EGIA 4 XL (STAPLE) ×3 IMPLANT
HOVERMATT SINGLE USE (MISCELLANEOUS) ×3 IMPLANT
KIT BASIN OR (CUSTOM PROCEDURE TRAY) ×3 IMPLANT
KIT GASTRIC LAVAGE 34FR ADT (SET/KITS/TRAYS/PACK) ×3 IMPLANT
MARKER SKIN DUAL TIP RULER LAB (MISCELLANEOUS) ×3 IMPLANT
NDL SPNL 22GX3.5 QUINCKE BK (NEEDLE) ×1 IMPLANT
NEEDLE SPNL 22GX3.5 QUINCKE BK (NEEDLE) ×3 IMPLANT
PACK CARDIOVASCULAR III (CUSTOM PROCEDURE TRAY) ×3 IMPLANT
QUICK LOAD TK 5 (STAPLE)
RELOAD EGIA 45 MED/THCK PURPLE (STAPLE) IMPLANT
RELOAD EGIA 45 TAN VASC (STAPLE) IMPLANT
RELOAD EGIA 60 MED/THCK PURPLE (STAPLE) IMPLANT
RELOAD EGIA 60 TAN VASC (STAPLE) IMPLANT
RELOAD ENDO STITCH 2.0 (ENDOMECHANICALS) ×30
RELOAD STAPLE 45 PURP MED/THCK (STAPLE) IMPLANT
RELOAD STAPLE 60 MED/THCK ART (STAPLE) IMPLANT
RELOAD SUT SNGL STCH ABSRB 2-0 (ENDOMECHANICALS) ×5 IMPLANT
RELOAD SUT SNGL STCH BLK 2-0 (ENDOMECHANICALS) ×4 IMPLANT
RELOAD TRI 45 ART MED THCK PUR (STAPLE) IMPLANT
RELOAD TRI 60 ART MED THCK PUR (STAPLE) ×4 IMPLANT
SCISSORS LAP 5X45 EPIX DISP (ENDOMECHANICALS) ×3 IMPLANT
SEALANT SURGICAL APPL DUAL CAN (MISCELLANEOUS) ×3 IMPLANT
SET BI-LUMEN FLTR TB AIRSEAL (TUBING) ×2 IMPLANT
SET IRRIG TUBING LAPAROSCOPIC (IRRIGATION / IRRIGATOR) ×3 IMPLANT
SHEARS HARMONIC ACE PLUS 45CM (MISCELLANEOUS) ×3 IMPLANT
SLEEVE ADV FIXATION 12X100MM (TROCAR) ×6 IMPLANT
SLEEVE ADV FIXATION 5X100MM (TROCAR) IMPLANT
SOLUTION ANTI FOG 6CC (MISCELLANEOUS) ×3 IMPLANT
STAPLER VISISTAT 35W (STAPLE) ×3 IMPLANT
SUT MNCRL AB 4-0 PS2 18 (SUTURE) ×8 IMPLANT
SUT RELOAD ENDO STITCH 2 48X1 (ENDOMECHANICALS) ×5
SUT RELOAD ENDO STITCH 2.0 (ENDOMECHANICALS) ×5
SUT SURGIDAC NAB ES-9 0 48 120 (SUTURE) IMPLANT
SUT VIC AB 2-0 SH 27 (SUTURE) ×3
SUT VIC AB 2-0 SH 27X BRD (SUTURE) ×1 IMPLANT
SUTURE RELOAD END STTCH 2 48X1 (ENDOMECHANICALS) ×5 IMPLANT
SUTURE RELOAD ENDO STITCH 2.0 (ENDOMECHANICALS) ×5 IMPLANT
SYR 10ML ECCENTRIC (SYRINGE) ×3 IMPLANT
SYR 20CC LL (SYRINGE) ×6 IMPLANT
SYR 50ML LL SCALE MARK (SYRINGE) ×3 IMPLANT
TOWEL OR 17X26 10 PK STRL BLUE (TOWEL DISPOSABLE) ×6 IMPLANT
TOWEL OR NON WOVEN STRL DISP B (DISPOSABLE) ×3 IMPLANT
TRAY FOLEY W/METER SILVER 16FR (SET/KITS/TRAYS/PACK) ×3 IMPLANT
TROCAR ADV FIXATION 12X100MM (TROCAR) ×3 IMPLANT
TROCAR ADV FIXATION 5X100MM (TROCAR) ×3 IMPLANT
TROCAR BLADELESS OPT 5 100 (ENDOMECHANICALS) ×3 IMPLANT
TROCAR XCEL 12X100 BLDLESS (ENDOMECHANICALS) ×3 IMPLANT
TUBE CALIBRATION LAPBAND (TUBING) ×2 IMPLANT
TUBING CONNECTING 10 (TUBING) ×4 IMPLANT
TUBING CONNECTING 10' (TUBING) ×2
TUBING ENDO SMARTCAP PENTAX (MISCELLANEOUS) ×3 IMPLANT

## 2018-01-24 NOTE — Op Note (Signed)
Upper GI endoscopy is performed at the completion of laparoscopic Roux-en-Y gastric bypass by Dr. Hassell Done. The Olympus video endoscope was inserted into the upper esophagus and then passed under direct vision to the EG junction. The small gastric pouch was insufflated with air while the gastric outlet was clamped under irrigation by the operating surgeon. There was no evidence of leak. The anastomosis was visualized and was patent. Suture and staple lines were intact and without bleeding. The pouch was tubular and measured 4-5 cm in length. At the completion of the procedure the pouch was desufflated and the scope withdrawn.

## 2018-01-24 NOTE — Progress Notes (Signed)
   01/24/18 1551  Mobility  Activity Ambulated in room;Ambulated in hall;Transferred:  Bed to chair  Range of Motion Active;All extremities  Level of Assistance Contact guard assist, steadying assist  Assistive Device None  Distance Ambulated (ft) 370 ft  Mobility Response Tolerated well

## 2018-01-24 NOTE — Op Note (Signed)
Surgeon: Althea Grimmer. Hassell Done, MD, FACS Asst:  Adonis Housekeeper, MD, FACS Anesthesia: General endotracheal Drains: None  Procedure: Laparoscopic Roux en Y gastric bypass with 40 cm BP limb and 100 cm Roux limb, antecolic, antegastric, candy cane to the left.  Closure of Peterson's defect. Upper endoscopy.   Description of Procedure:  The patient was taken to OR 2 at [facility] and given general anesthesia.  The abdomen was prepped with chlorohexidine and draped sterilely.  A time out was performed.  Standard port sites were used except the 8 mm Airseal was placed in the left upper quadrant.    The operation began by identifying the ligament of Treitz. I measured 40 cm downstream and divided the bowel with a 6 cm Covidian stapler brown.  I sutured a Penrose drain along the Roux limb end.  I measured a 1 meter (100 cm) Roux limb and then placed the distal bowels to the BP limb side by side and performed a stapled jejunojejunostomy. The common defect was closed from either end with 4-0 Vicryl using the Endo Stitch. The mesenteric defect was closed with a running 2-0 silk using the Endo Stitch. Tisseel was applied to the suture line.  The omentum was divided with the harmonic scalpel.  The balloon test was used to check for a hiatal hernia and none was noted  The Nathanson retractor was inserted in the left lateral segment of liver was retracted. The foregut dissection ensued.  A 5 cm pouch was constructed by measuring first and then dividing the pouch with multiple application of the Covidien purple load without TRS (x 2) and the remaining staples with TRS.    The Roux limb was then brought up with the candycane pointed left and a back row of sutures of 2-0 Vicryl were placed. I opened along the right side of each structure and inserted the 4.5 cm stapler to create the gastrojejunostomy. The common defect was closed from either end with 2-0 Vicryl and a second row was placed anterior to that the Ewald tube acting as a  stent across the anastomosis. The Penrose drain was removed. Peterson's defect was closed with 2-0 silk.   Endoscopy was performed by Dr Excell Seltzer and no bubbles were seen.  Tisseal applied.   A TAP block was placed with Exparel diluted to 30 cc and an injection was also made in the [place] site.  The incisions were closed with 4-0 Monocryl and Dermabond.    The patient was taken to the recovery room in satisfactory condition.  Matt B. Hassell Done, MD, FACS

## 2018-01-24 NOTE — Interval H&P Note (Signed)
History and Physical Interval Note:  01/24/2018 7:00 AM  [name]  has presented today for surgery, with the diagnosis of Morbid Obesity, HTN, HLD, GERD, NASH, SVT, Vit D Def  The various methods of treatment have been discussed with the patient and family. After consideration of risks, benefits and other options for treatment, the patient has consented to  Procedure(s): LAPAROSCOPIC ROUX-EN-Y GASTRIC BYPASS WITH UPPER ENDOSCOPY (N/A) as a surgical intervention .  The patient's history has been reviewed, patient examined, no change in status, stable for surgery.  I have reviewed the patient's chart and labs.  Questions were answered to the patient's satisfaction.     Selena [name]

## 2018-01-24 NOTE — Transfer of Care (Signed)
Immediate Anesthesia Transfer of Care Note  Patient: Selena [name]  Procedure(s) Performed: LAPAROSCOPIC ROUX-EN-Y GASTRIC BYPASS WITH UPPER ENDOSCOPY (N/A Abdomen)  Patient Location: PACU  Anesthesia Type:General  Level of Consciousness: awake, alert , oriented and patient cooperative  Airway & Oxygen Therapy: Patient Spontanous Breathing and Patient connected to face mask oxygen  Post-op Assessment: Report given to RN, Post -op Vital signs reviewed and stable and Patient moving all extremities X 4  Post vital signs: stable  Last Vitals:  Vitals Value Taken Time  BP 122/77 01/24/2018 11:00 AM  Temp    Pulse 99 01/24/2018 11:00 AM  Resp 16 01/24/2018 11:00 AM  SpO2 100 % 01/24/2018 11:00 AM  Vitals shown include unvalidated device data.  Last Pain:  Vitals:   01/24/18 5110  TempSrc:   PainSc: 0-No pain         Complications: No apparent anesthesia complications

## 2018-01-24 NOTE — Discharge Instructions (Signed)
° ° ° °GASTRIC BYPASS/SLEEVE ° Home Care Instructions ° ° These instructions are to help you care for yourself when you go home. ° °Call: If you have any problems. °• Call [phone] and ask for the surgeon on call °• If you need immediate help, come to the ER at [facility].  °• Tell the ER staff that you are a new post-op gastric bypass or gastric sleeve patient °  °Signs and symptoms to report: • Severe vomiting or nausea °o If you cannot keep down clear liquids for longer than 1 day, call your surgeon  °• Abdominal pain that does not get better after taking your pain medication °• Fever over 100.4° F with chills °• Heart beating over 100 beats a minute °• Shortness of breath at rest °• Chest pain °•  Redness, swelling, drainage, or foul odor at incision (surgical) sites °•  If your incisions open or pull apart °• Swelling or pain in calf (lower leg) °• Diarrhea (Loose bowel movements that happen often), frequent watery, uncontrolled bowel movements °• Constipation, (no bowel movements for 3 days) if this happens: Pick one °o Milk of Magnesia, 2 tablespoons by mouth, 3 times a day for 2 days if needed °o Stop taking Milk of Magnesia once you have a bowel movement °o Call your doctor if constipation continues °Or °o Miralax  (instead of Milk of Magnesia) following the label instructions °o Stop taking Miralax once you have a bowel movement °o Call your doctor if constipation continues °• Anything you think is not normal °  °Normal side effects after surgery: • Unable to sleep at night or unable to focus °• Irritability or moody °• Being tearful (crying) or depressed °These are common complaints, possibly related to your anesthesia medications that put you to sleep, stress of surgery, and change in lifestyle.  This usually goes away a few weeks after surgery.  If these feelings continue, call your primary care doctor. °  °Wound Care: You may have surgical glue, steri-strips, or staples over your incisions after  surgery °• Surgical glue:  Looks like a clear film over your incisions and will wear off a little at a time °• Steri-strips: Strips of tape over your incisions. You may notice a yellowish color on the skin under the steri-strips. This is used to make the   steri-strips stick better. Do not pull the steri-strips off - let them fall off °• Staples: Staples may be removed before you leave the hospital °o If you go home with staples, call [facility], [phone] at for an appointment with your surgeon’s nurse to have staples removed 10 days after surgery. °• Showering: You may shower two (2) days after your surgery unless your surgeon tells you differently °o Wash gently around incisions with warm soapy water, rinse well, and gently pat dry  °o No tub baths until staples are removed, steri-strips fall off or glue is gone.  °  °Medications: • Medications should be liquid or crushed if larger than the size of a dime °• Extended release pills (medication that release a little bit at a time through the day) should NOT be crushed or cut. (examples include XL, ER, DR, SR) °• Depending on the size and number of medications you take, you may need to space (take a few throughout the day)/change the time you take your medications so that you do not over-fill your pouch (smaller stomach) °• Make sure you follow-up with your primary care doctor to   make medication changes needed during rapid weight loss and life-style changes °• If you have diabetes, follow up with the doctor that orders your diabetes medication(s) within one week after surgery and check your blood sugar regularly. °• Do not drive while taking prescription pain medication  °• It is ok to take Tylenol by the bottle instructions with your pain medicine or instead of your pain medicine as needed.  DO NOT TAKE NSAIDS (EXAMPLES OF NSAIDS:  IBUPROFREN/ NAPROXEN)  °Diet:                    First 2 Weeks ° You will see the dietician t about two (2) weeks  after your surgery. The dietician will increase the types of foods you can eat if you are handling liquids well: °• If you have severe vomiting or nausea and cannot keep down clear liquids lasting longer than 1 day, call your surgeon @ ([phone]) °Protein Shake °• Drink at least 2 ounces of shake 5-6 times per day °• Each serving of protein shakes (usually 8 - 12 ounces) should have: °o 15 grams of protein  °o And no more than 5 grams of carbohydrate  °• Goal for protein each day: °o Men = 80 grams per day °o Women = 60 grams per day °• Protein powder may be added to fluids such as non-fat milk or Lactaid milk or unsweetened Soy/Almond milk (limit to 35 grams added protein powder per serving) ° °Hydration °• Slowly increase the amount of water and other clear liquids as tolerated (See Acceptable Fluids) °• Slowly increase the amount of protein shake as tolerated  °•  Sip fluids slowly and throughout the day.  Do not use straws. °• May use sugar substitutes in small amounts (no more than 6 - 8 packets per day; i.e. Splenda) ° °Fluid Goal °• The first goal is to drink at least 8 ounces of protein shake/drink per day (or as directed by the nutritionist); some examples of protein shakes are Syntrax Nectar, Adkins Advantage, EAS Edge HP, and Unjury. See handout from pre-op Bariatric Education Class: °o Slowly increase the amount of protein shake you drink as tolerated °o You may find it easier to slowly sip shakes throughout the day °o It is important to get your proteins in first °• Your fluid goal is to drink 64 - 100 ounces of fluid daily °o It may take a few weeks to build up to this °• 32 oz (or more) should be clear liquids  °And  °• 32 oz (or more) should be full liquids (see below for examples) °• Liquids should not contain sugar, caffeine, or carbonation ° °Clear Liquids: °• Water or Sugar-free flavored water (i.e. Fruit H2O, Propel) °• Decaffeinated coffee or tea (sugar-free) °• Crystal Lite, Wyler’s Lite,  Minute Maid Lite °• Sugar-free Jell-O °• Bouillon or broth °• Sugar-free Popsicle:   *Less than 20 calories each; Limit 1 per day ° °Full Liquids: °Protein Shakes/Drinks + 2 choices per day of other full liquids °• Full liquids must be: °o No More Than 15 grams of Carbs per serving  °o No More Than 3 grams of Fat per serving °• Strained low-fat cream soup (except Cream of Potato or Tomato) °• Non-Fat milk °• Fat-free Lactaid Milk °• Unsweetened Soy Or Unsweetened Almond Milk °• Low Sugar yogurt (Dannon Lite & Fit, Greek yogurt; Oikos Triple Zero; Chobani Simply 100; Yoplait 100 calorie Greek - No Fruit on the Bottom) ° °  °Vitamins   and Minerals • Start 1 day after surgery unless otherwise directed by your surgeon °• 2 Chewable Bariatric Specific Multivitamin / Multimineral Supplement with iron (Example: Bariatric Advantage Multi EA) °• Chewable Calcium with Vitamin D-3 °(Example: 3 Chewable Calcium Plus 600 with Vitamin D-3) °o Take 500 mg three (3) times a day for a total of 1500 mg each day °o Do not take all 3 doses of calcium at one time as it may cause constipation, and you can only absorb 500 mg  at a time  °o Do not mix multivitamins containing iron with calcium supplements; take 2 hours apart °• Menstruating women and those with a history of anemia (a blood disease that causes weakness) may need extra iron °o Talk with your doctor to see if you need more iron °• Do not stop taking or change any vitamins or minerals until you talk to your dietitian or surgeon °• Your Dietitian and/or surgeon must approve all vitamin and mineral supplements °  °Activity and Exercise: Limit your physical activity as instructed by your doctor.  It is important to continue walking at home.  During this time, use these guidelines: °• Do not lift anything greater than ten (10) pounds for at least two (2) weeks °• Do not go back to work or drive until your surgeon says you can °• You may have sex when you feel comfortable  °o It is  VERY important for female patients to use a reliable birth control method; fertility often increases after surgery  °o All hormonal birth control will be ineffective for 30 days after surgery due to medications given during surgery a barrier method must be used. °o Do not get pregnant for at least 18 months °• Start exercising as soon as your doctor tells you that you can °o Make sure your doctor approves any physical activity °• Start with a simple walking program °• Walk 5-15 minutes each day, 7 days per week.  °• Slowly increase until you are walking 30-45 minutes per day °Consider joining our BELT program. [phone] or email [email or web address] °  °Special Instructions Things to remember: °• Use your CPAP when sleeping if this applies to you ° °• [facility] has two free Bariatric Surgery Support Groups that meet monthly °o The 3rd Thursday of each month, 6 pm, [facility]  °o The 2nd Friday of each month, 11:45 am in the private dining room in the basement of [facility] °• It is very important to keep all follow up appointments with your surgeon, dietitian, primary care physician, and behavioral health practitioner °• Routine follow up schedule with your surgeon include appointments at 2-3 weeks, 6-8 weeks, 6 months, and 1 year at a minimum.  Your surgeon may request to see you more often.   °o After the first year, please follow up with your bariatric surgeon and dietitian at least once a year in order to maintain best weight loss results °[facility]: [phone] °[facility]: [phone] °Bariatric Nurse Coordinator: [phone] °  °   Reviewed and Endorsed  °by [facility] Patient Education Committee, June, 2016 °Edits Approved: Aug, 2018 ° ° ° °

## 2018-01-24 NOTE — Progress Notes (Signed)
   01/24/18 1525  Intake (mL)  P.O.  (water given)

## 2018-01-24 NOTE — Anesthesia Procedure Notes (Signed)
Procedure Name: Intubation Date/Time: 01/24/2018 7:38 AM Performed by: Maxwell Caul, CRNA Pre-anesthesia Checklist: Patient identified, Emergency Drugs available, Suction available and Patient being monitored Patient Re-evaluated:Patient Re-evaluated prior to induction Oxygen Delivery Method: Circle system utilized Preoxygenation: Pre-oxygenation with 100% oxygen Induction Type: IV induction Ventilation: Mask ventilation without difficulty Laryngoscope Size: Mac and 4 Grade View: Grade I Tube type: Oral Tube size: 7.5 mm Number of attempts: 1 Airway Equipment and Method: Stylet Placement Confirmation: ETT inserted through vocal cords under direct vision,  positive ETCO2 and breath sounds checked- equal and bilateral Secured at: 21 cm Tube secured with: Tape Dental Injury: Teeth and Oropharynx as per pre-operative assessment

## 2018-01-24 NOTE — Anesthesia Postprocedure Evaluation (Signed)
Anesthesia Post Note  Patient: Selena [name]  Procedure(s) Performed: LAPAROSCOPIC ROUX-EN-Y GASTRIC BYPASS WITH UPPER ENDOSCOPY (N/A Abdomen)     Patient location during evaluation: PACU Anesthesia Type: General Level of consciousness: awake and alert Pain management: pain level controlled Vital Signs Assessment: post-procedure vital signs reviewed and stable Respiratory status: spontaneous breathing, nonlabored ventilation and respiratory function stable Cardiovascular status: blood pressure returned to baseline and stable Postop Assessment: no apparent nausea or vomiting Anesthetic complications: no    Last Vitals:  Vitals:   01/24/18 1057 01/24/18 1200  BP: 132/65   Pulse: 99   Resp: 12   Temp: 36.5 C 36.5 C  SpO2: 100%     Last Pain:  Vitals:   01/24/18 1200  TempSrc:   PainSc: 4                  Desmund Elman,W. EDMOND

## 2018-01-25 ENCOUNTER — Encounter (HOSPITAL_COMMUNITY): Payer: Self-pay | Admitting: Surgery

## 2018-01-25 LAB — CBC WITH DIFFERENTIAL/PLATELET
Basophils Absolute: 0 10*3/uL (ref 0.0–0.1)
Basophils Relative: 0 %
Eosinophils Absolute: 0 10*3/uL (ref 0.0–0.7)
Eosinophils Relative: 0 %
HCT: 37.5 % (ref 36.0–46.0)
Hemoglobin: 12.6 g/dL (ref 12.0–15.0)
Lymphocytes Relative: 12 %
Lymphs Abs: 1.8 10*3/uL (ref 0.7–4.0)
MCH: 31.6 pg (ref 26.0–34.0)
MCHC: 33.6 g/dL (ref 30.0–36.0)
MCV: 94 fL (ref 78.0–100.0)
Monocytes Absolute: 0.8 10*3/uL (ref 0.1–1.0)
Monocytes Relative: 5 %
Neutro Abs: 12.8 10*3/uL — ABNORMAL HIGH (ref 1.7–7.7)
Neutrophils Relative %: 83 %
Platelets: 292 10*3/uL (ref 150–400)
RBC: 3.99 MIL/uL (ref 3.87–5.11)
RDW: 13 % (ref 11.5–15.5)
WBC: 15.4 10*3/uL — ABNORMAL HIGH (ref 4.0–10.5)

## 2018-01-25 NOTE — Progress Notes (Signed)
Patient drowsy but arousable.  VSS.  Provided support and encouragement to pt and pt's mother in room.  Encouraged pulmonary toilet, ambulation.  60cc ice/water given to pt; pt instructed to take small sips slowly. Pt voiced understanding.  All questions answered.  Will continue to monitor.

## 2018-01-25 NOTE — Progress Notes (Signed)
Pt alert, oriented, tolerating liquids.  D/C instructions given, all questions answered.  Pt was d/cd home with mom.

## 2018-01-25 NOTE — Plan of Care (Signed)
Nutrition Education Note  Received consult for diet education per DROP protocol.   Discussed 2 week post op diet with pt. Emphasized that liquids must be non carbonated, non caffeinated, and sugar free. Fluid goals discussed. Pt to follow up with outpatient bariatric RD for further diet progression after 2 weeks. Multivitamins and minerals also reviewed. Teach back method used, pt expressed understanding, expect good compliance.   Diet: First 2 Weeks  You will see the nutritionist about two (2) weeks after your surgery. The nutritionist will increase the types of foods you can eat if you are handling liquids well:  If you have severe vomiting or nausea and cannot handle clear liquids lasting longer than 1 day, call your surgeon  Protein Shake  Drink at least 2 ounces of shake 5-6 times per day  Each serving of protein shakes (usually 8 - 12 ounces) should have a minimum of:  15 grams of protein  And no more than 5 grams of carbohydrate  Goal for protein each day:  Men = 80 grams per day  Women = 60 grams per day  Protein powder may be added to fluids such as non-fat milk or Lactaid milk or Soy milk (limit to 35 grams added protein powder per serving)   Hydration  Slowly increase the amount of water and other clear liquids as tolerated (See Acceptable Fluids)  Slowly increase the amount of protein shake as tolerated  Sip fluids slowly and throughout the day  May use sugar substitutes in small amounts (no more than 6 - 8 packets per day; i.e. Splenda)   Fluid Goal  The first goal is to drink at least 8 ounces of protein shake/drink per day (or as directed by the nutritionist); some examples of protein shakes are Premier Protein, Syntrax Nectar, Adkins Advantage, EAS Edge HP, and Unjury. See handout from pre-op Bariatric Education Class:  Slowly increase the amount of protein shake you drink as tolerated  You may find it easier to slowly sip shakes throughout the day  It is important to  get your proteins in first  Your fluid goal is to drink 64 - 100 ounces of fluid daily  It may take a few weeks to build up to this  32 oz (or more) should be clear liquids  And  32 oz (or more) should be full liquids (see below for examples)  Liquids should not contain sugar, caffeine, or carbonation   Clear Liquids:  Water or Sugar-free flavored water (i.e. Fruit H2O, Propel)  Decaffeinated coffee or tea (sugar-free)  Crystal Lite, Wyler?s Lite, Minute Maid Lite  Sugar-free Jell-O  Bouillon or broth  Sugar-free Popsicle: *Less than 20 calories each; Limit 1 per day   Full Liquids:  Protein Shakes/Drinks + 2 choices per day of other full liquids  Full liquids must be:  No More Than 12 grams of Carbs per serving  No More Than 3 grams of Fat per serving  Strained low-fat cream soup  Non-Fat milk  Fat-free Lactaid Milk  Sugar-free yogurt (Dannon Lite & Fit, Greek yogurt, Oikos Zero)   Keir Viernes RD, LDN Clinical Nutrition Pager # - [phone]   

## 2018-01-25 NOTE — Discharge Summary (Signed)
Physician Discharge Summary  Patient ID: Selena [name] MRN: [id number] DOB/AGE: 1966/02/27 52 y.o.  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  She was started on clears and advanced to protein shakes and she was ready for discharge on Tuesday.    Consults: none  Significant Diagnostic Studies: none    Discharge Exam: Blood pressure 122/72, pulse 72, temperature 98 F (36.7 C), temperature source Oral, resp. rate 15, height 5\' 2"  (1.575 m), weight 111.8 kg (246 lb 7.6 oz), SpO2 98 %. Incisions healing OK  Disposition: Discharge disposition: 01-Home or Self Care       Discharge Instructions    Ambulate hourly while awake   Complete by:  As directed    Call MD for:  difficulty breathing, headache or visual disturbances   Complete by:  As directed    Call MD for:  persistant dizziness or light-headedness   Complete by:  As directed    Call MD for:  persistant nausea and vomiting   Complete by:  As directed    Call MD for:  redness, tenderness, or signs of infection (pain, swelling, redness, odor or green/yellow discharge around incision site)   Complete by:  As directed    Call MD for:  severe uncontrolled pain   Complete by:  As directed    Call MD for:  temperature >101 F   Complete by:  As directed    Diet bariatric full liquid   Complete by:  As directed    Discharge wound care:   Complete by:  As directed    May shower ad lib   Incentive spirometry   Complete by:  As directed    Perform hourly while awake     Allergies as of 01/25/2018      Reactions   Fish Allergy Nausea And Vomiting   Augmentin [amoxicillin-pot Clavulanate] Diarrhea   Can take regular amoxicillin with no issues Has  patient had a PCN reaction causing immediate rash, facial/tongue/throat swelling, SOB or lightheadedness with hypotension: No Has patient had a PCN reaction causing severe rash involving mucus membranes or skin necrosis: No Has patient had a PCN reaction that required hospitalization: No Has patient had a PCN reaction occurring within the last 10 years: Yes--but DIARRHEA ONLY If all of the above answers are "NO", then may proceed with Cephalosporin [place]   Eggs Or Egg-derived Products Diarrhea, Nausea And Vomiting   Sulfonamide Derivatives Itching      Medication List    STOP taking these medications   ibuprofen 200 MG tablet Commonly known as:  ADVIL,MOTRIN   lactulose 10 GM/15ML solution Commonly known as:  CHRONULAC   oseltamivir 75 MG capsule Commonly known as:  TAMIFLU     TAKE these medications   buPROPion 100 MG tablet Commonly known as:  WELLBUTRIN Take 1 tablet (100 mg total) by mouth 2 (two) times daily. For 2 weeks,  Then increase to 2 tablets twice daily   cetirizine 10 MG tablet Commonly known as:  ZYRTEC Take 10 mg by mouth daily as needed for allergies.   clonazePAM 1 MG tablet Commonly known as:  KLONOPIN Take 1 tablet (1 mg total) by mouth at bedtime. As needed for insomnia   diazepam 10  MG tablet Commonly known as:  VALIUM TAKE 1 TABLET BY MOUTH DAILY AS NEEDED FOR ANXIETY AND MUSCLE SPASMS   FLUoxetine 20 MG tablet Commonly known as:  PROZAC TAKE 3 TABLETS (60 MG TOTAL) BY MOUTH DAILY. What changed:    how much to take  when to take this  additional instructions   fluticasone 50 MCG/ACT nasal spray Commonly known as:  FLONASE Place 2 sprays into both nostrils daily. What changed:    when to take this  reasons to take this   MIRENA (52 MG) 20 MCG/24HR IUD Generic drug:  levonorgestrel 1 Intra Uterine Device (1 each total) by Intrauterine route once.   omeprazole 20 MG tablet Commonly known as:  PRILOSEC OTC Take 20 mg by mouth daily  before breakfast.   pramipexole 0.125 MG tablet Commonly known as:  MIRAPEX TAKE 1 TABLET BY MOUTH DAILY AT BEDTIME. INCREASE WEEKLY AS NEEDED What changed:  See the new instructions.   rizatriptan 10 MG tablet Commonly known as:  MAXALT TAKE 1 TABLET (10 MG TOTAL) BY MOUTH AS NEEDED FOR MIGRAINE. MAY REPEAT IN 2 HOURS IF NEEDED   topiramate 100 MG tablet Commonly known as:  TOPAMAX TAKE 1 TABLET (100 MG TOTAL) BY MOUTH DAILY. What changed:  when to take this            Discharge Care Instructions  (From admission, onward)        Start     Ordered   01/25/18 0000  Discharge wound care:    Comments:  May shower ad lib   01/25/18 1247     Follow-up Information    [facility] [place] Follow up on 02/10/2018.   Specialty:  General Surgery Why:  @ 9am with Dr. Carter Kitten information: [address] [place] [address] [phone]        [facility], [place] .   Specialty:  General Surgery Contact information: [address] [place] [address] [phone]           Signed: Pedro Earls 01/25/2018, 12:48 PM

## 2018-01-27 ENCOUNTER — Other Ambulatory Visit: Payer: Self-pay | Admitting: Internal Medicine

## 2018-01-31 ENCOUNTER — Telehealth (HOSPITAL_COMMUNITY): Payer: Self-pay

## 2018-01-31 NOTE — Telephone Encounter (Signed)
Patient called to discuss post bariatric surgery follow up questions.  See below:   1.  Tell me about your pain and pain management?using Tylenol only for pain control  2.  Let's talk about fluid intake.  How much total fluid are you taking in?has been getting over 45 ounces of fluid last couple of days  3.  How much protein have you taken in the last 2 days?20 grams of protein  4.  Have you had nausea?  Tell me about when have experienced nausea and what you did to help?reports no nausea  5.  Has the frequency or color changed with your urine?urinating frequently   6.  Tell me what your incisions look like?no problems  7.  Have you been passing gas? BM?Had BM  8.  If a problem or question were to arise who would you call?  Do you know contact numbers for [facility], [facility], and [facility]?aware of how to contact services  9.  How has the walking going?walking frequently back at work today  10.  How are your vitamins and calcium going?  How are you taking them?discussed how to take calcium 3x's per day in divided doses.  Also that chewable MVI recommended  Patient asked about yogurt, Phase II diet discussed with patient. Also discussed strategies to increase protein intake with use of [place] Yogurt and dairy products.  Patient had no further questions.

## 2018-02-04 ENCOUNTER — Other Ambulatory Visit: Payer: Self-pay | Admitting: Internal Medicine

## 2018-02-04 ENCOUNTER — Encounter: Payer: Self-pay | Admitting: Internal Medicine

## 2018-02-04 DIAGNOSIS — G43009 Migraine without aura, not intractable, without status migrainosus: Secondary | ICD-10-CM

## 2018-02-07 ENCOUNTER — Telehealth: Payer: Self-pay | Admitting: Skilled Nursing Facility1

## 2018-02-07 NOTE — Telephone Encounter (Signed)
Nope. J  Follow the front of your sheet and chew until applesauce consistency. Start will soft proteins foods. Call us if you have any issues tolerating.   From: Alessia [email or web address]  Sent: Monday, Feb 07, 2018 9:18 AM To: Sandie Ano [email or web address]> Subject: [External Email]RE: [External Email]Menu for week of pure food,  *Caution - External email - see footer for warnings* Don't I have to puree all this stuff    Sent from my [employer]  -------- Original message -------- From: "Scotece, Alexis" [email or web address]>  Date: 02/07/18 9:02 AM (GMT-05:00)  To: [name] [email or web address]>  Subject: RE: [External Email]Menu for week of pure food,   Hello [place],  Attached you will find the next advancement. Please keep in mind this handout is usually given with instruction so please keep your follow-up so we can guide you through your advancement and ensure you are tolerating your foods.  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 Thanks, [name]  Sent from my iPad WARNING: This email originated outside of [facility]. Even if this looks like a [employer], it is not. Do not provide your username, password, or any other personal information in response to this or any other email. [facility] will never ask you for your username or password via email. DO NOT CLICK  links or attachments unless you are positive the content is safe. If in doubt about the safety of this message, select the [employer] button, which forwards to IT Security.    NOTICE: This message may contain confidential information intended only for the recipient.  If you have received this communication in error, please notify the sender immediately by replying to the message and deleting it from your computer. WARNING: This email originated outside of [facility]. Even if this looks like a [employer], it is not. Do not provide your username, password, or any other personal information in response to this or any other email. [facility] will never ask you for your username or password via email. DO NOT CLICK links or attachments unless you are positive the content is safe. If in doubt about the safety of this message, select the [employer] Phishing button, which forwards to IT Security.

## 2018-02-07 NOTE — Telephone Encounter (Signed)
Hello Selena [name],  Attached you will find the next advancement. Please keep in mind this handout is usually given with instruction so please keep your follow-up so we can guide you through your advancement and ensure you are tolerating your foods.  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 Thanks, [name]  Sent from my iPad WARNING: This email originated outside of [facility]. Even if this looks like a [employer], it is not. Do not provide your username, password, or any other personal information in response to this or any other email. [name] will never ask you for your username or password via email. DO NOT CLICK links or attachments unless you are positive the content is safe. If in doubt about the safety of this message, select the [employer] button, which forwards to IT Security.

## 2018-02-08 ENCOUNTER — Ambulatory Visit: Payer: Self-pay

## 2018-02-13 ENCOUNTER — Other Ambulatory Visit: Payer: Self-pay | Admitting: Internal Medicine

## 2018-02-14 ENCOUNTER — Encounter: Payer: Self-pay | Admitting: Registered"

## 2018-02-14 ENCOUNTER — Encounter: Payer: BC Managed Care – PPO | Attending: Surgery | Admitting: Registered"

## 2018-02-14 DIAGNOSIS — E669 Obesity, unspecified: Secondary | ICD-10-CM

## 2018-02-14 DIAGNOSIS — Z713 Dietary counseling and surveillance: Secondary | ICD-10-CM | POA: Insufficient documentation

## 2018-02-14 DIAGNOSIS — Z6841 Body Mass Index (BMI) 40.0 and over, adult: Secondary | ICD-10-CM | POA: Insufficient documentation

## 2018-02-14 NOTE — Progress Notes (Signed)
Bariatric Class:  Appt start time: 4:30 end time:  5:21  2 Week Post-Operative Nutrition Class  Patient was seen on 02/14/2018 for Post-[facility]ative Nutrition [facility].   Surgery date: 01/24/2018 Surgery type: RYGB Start weight at [facility]: 235.0 Weight today: 218.4 Weight change: 16.6 lbs loss  Fluid intake: ~ 50 oz Protein intake: 30 (protein shake) +12 (greek yogurt) +7 (cheese) + ham (7g), low fat cream cheese (7g) = 63 grams. Pt states she gets gas in her chest sometimes and it is painful. Pt states she has diarrhea sometimes.  Pt states her son is graduating in 2 weeks. Pt states she has more energy, less knee pain, taking less naps during the day.   TANITA  BODY COMP RESULTS  02/14/2018   BMI (kg/m^2) 39.9   Fat Mass (lbs) 104.4   Fat Free Mass (lbs) 114.0   Total Body Water (lbs) 82.6    The following the learning objectives were met by the patient during this course:  Identifies Phase 3A (Soft, High Proteins) Dietary Goals and will begin from 2 weeks post-operatively to 2 months post-operatively  Identifies appropriate sources of fluids and proteins   States protein recommendations and appropriate sources post-operatively  Identifies the need for appropriate texture modifications, mastication, and bite sizes when consuming solids  Identifies appropriate multivitamin and calcium sources post-operatively  Describes the need for physical activity post-operatively and will follow MD recommendations  States when to call healthcare provider regarding medication questions or post-operative complications  Handouts given during class include:  Phase 3A: Soft, High Protein Diet Handout  Follow-Up Plan: Patient will follow-up at [facility] in 5 weeks for 2 month post-op nutrition visit for diet advancement per MD.

## 2018-03-08 ENCOUNTER — Other Ambulatory Visit (HOSPITAL_COMMUNITY): Payer: Self-pay | Admitting: Surgery

## 2018-03-08 DIAGNOSIS — R52 Pain, unspecified: Secondary | ICD-10-CM

## 2018-03-09 ENCOUNTER — Ambulatory Visit (HOSPITAL_COMMUNITY)
Admission: RE | Admit: 2018-03-09 | Discharge: 2018-03-09 | Disposition: A | Discharge status: Home / Self Care | Payer: BC Managed Care – PPO | Source: Ambulatory Visit | Attending: Internal Medicine | Admitting: Internal Medicine

## 2018-03-09 DIAGNOSIS — R52 Pain, unspecified: Secondary | ICD-10-CM | POA: Diagnosis not present

## 2018-03-09 DIAGNOSIS — M79604 Pain in right leg: Secondary | ICD-10-CM | POA: Diagnosis present

## 2018-03-09 DIAGNOSIS — M79605 Pain in left leg: Secondary | ICD-10-CM | POA: Insufficient documentation

## 2018-03-09 NOTE — Progress Notes (Signed)
LE venous duplex prelim: negative for DVT. Landry Mellow, RDMS, RVT  Called results to [place] at [facility].

## 2018-03-10 ENCOUNTER — Encounter: Payer: Self-pay | Admitting: Internal Medicine

## 2018-03-22 ENCOUNTER — Encounter: Payer: Self-pay | Admitting: Registered"

## 2018-03-22 ENCOUNTER — Encounter: Payer: BC Managed Care – PPO | Attending: Surgery | Admitting: Registered"

## 2018-03-22 ENCOUNTER — Ambulatory Visit (INDEPENDENT_AMBULATORY_CARE_PROVIDER_SITE_OTHER): Payer: BC Managed Care – PPO

## 2018-03-22 DIAGNOSIS — Z713 Dietary counseling and surveillance: Secondary | ICD-10-CM | POA: Diagnosis not present

## 2018-03-22 DIAGNOSIS — Z111 Encounter for screening for respiratory tuberculosis: Secondary | ICD-10-CM | POA: Diagnosis not present

## 2018-03-22 DIAGNOSIS — E669 Obesity, unspecified: Secondary | ICD-10-CM

## 2018-03-22 DIAGNOSIS — Z6841 Body Mass Index (BMI) 40.0 and over, adult: Secondary | ICD-10-CM | POA: Insufficient documentation

## 2018-03-22 NOTE — Patient Instructions (Addendum)
Goals:  Follow Phase 3B: High Protein + Non-Starchy Vegetables  Eat 3-6 small meals/snacks, every 3-5 hrs  Increase lean protein foods to meet 60g goal  Increase fluid intake to 64oz +  Avoid drinking 15 minutes before, during and 30 minutes after eating  Aim for >30 min of physical activity daily  Remember to chew well

## 2018-03-22 NOTE — Progress Notes (Signed)
Patient comes in today for a TB skin test. Placed in left forearm. Patient notified to return in 48-72 hours to have this skin test read.

## 2018-03-22 NOTE — Progress Notes (Signed)
Follow-up visit:  8 Weeks Post-Operative RYGB Surgery  Medical Nutrition Therapy:  Appt start time: 10:20 end time:  10:45.  Primary concerns today: Post-operative Bariatric Surgery Nutrition Management.  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 Pt states she is having a hard time with what to eat and what to do. Pt stats she sometimes gets the "brick" feeling in her chest after eating something new. Pt states she thinks her body does not like it or want it, causing discomfort. Pt states once when eating beef burger and cheese at [employer], immediately didn't like it. Pt states she also didn't tolerate [employer]- chili well. Pt states she knows that she eats super fast. Pt states she is in school, travels to [facility] 2x/week.  Pt states she was checked for blood clots and everything was good. Pt states her face swells when eating anything with sugar; reports having birthday cake her nieces birthday.    Preferred Learning Style:   No preference indicated   Learning Readiness:   Ready  Change in progress  24-hr recall: B (AM): 1-2 string cheese (6-12g) Snk (AM): sometimes almonds L (PM): protein shake (30g) Snk (PM): none  D (PM): protein shake (30g) or beans, cheese (21g) Snk (PM): none  Fluid intake:  flavored water, green tea, vitamin water zero; 64+ ounces  Estimated total protein intake: 60+ grams  Medications: See list Supplementation: Celebrate + 3 calcium supplements  Using straws: no Drinking while eating: no Having you been chewing well: sometimes Chewing/swallowing difficulties: no Changes in vision: no Changes to mood/headaches: no Hair loss/Cahnges to skin/Changes to nails: no, no, no Any difficulty focusing or concentrating: no Sweating: yes Dizziness/Lightheaded: sometimes Palpitations: no  Carbonated beverages: no N/V/D/C/GAS: yes with eggs, yes with eggs, no, no, no Abdominal Pain: no Dumping syndrome: no Last Lap-Band fill: N/A  Recent physical activity: Walking 40 min,  2x/week  Progress Towards Goal(s):  In progress.  Handouts given during visit include:  Phase IV: High protein + NS vegetables   Nutritional Diagnosis:  Pharr-3.3 Overweight/obesity related to past poor dietary habits and physical inactivity as evidenced by patient w/ recent RYGB surgery following dietary guidelines for continued weight loss.     Intervention:  Nutrition education and counseling. Pt was educated and counseled on the next phase of bariatric post-op diet. Pt was encouraged to pack snacks for days when traveling to and from class and reminded to chew at least 30 times per bite or to applesauce consistency.  Goals:  Follow Phase 3B: High Protein + Non-Starchy Vegetables  Eat 3-6 small meals/snacks, every 3-5 hrs  Increase lean protein foods to meet 60g goal  Increase fluid intake to 64oz +  Avoid drinking 15 minutes before, during  and 30 minutes after eating  Aim for >30 min of physical activity daily  Remember to chew well  Teaching Method Utilized:  Visual Auditory Hands on  Barriers to learning/adherence to lifestyle change: work-life balance  Demonstrated degree of understanding via:  Teach Back   Monitoring/Evaluation:  Dietary intake, exercise, lap band  fills, and body weight. Follow up in 4 months for 6 month post-op visit.

## 2018-03-24 ENCOUNTER — Ambulatory Visit: Payer: BC Managed Care – PPO

## 2018-03-24 ENCOUNTER — Encounter: Payer: Self-pay | Admitting: Gastroenterology

## 2018-03-24 DIAGNOSIS — Z111 Encounter for screening for respiratory tuberculosis: Secondary | ICD-10-CM

## 2018-03-24 LAB — TB SKIN TEST
Induration: 0 mm
TB Skin Test: NEGATIVE

## 2018-03-24 NOTE — Progress Notes (Signed)
Patient comes in today for a to have the TB skin test read which was placed on 03/22/2018. TB skin test is negative.

## 2018-03-28 ENCOUNTER — Other Ambulatory Visit: Payer: Self-pay | Admitting: Internal Medicine

## 2018-03-30 NOTE — Telephone Encounter (Signed)
Refilled: 07/15/2017 Last OV: 01/12/2018 Next OV: not scheduled

## 2018-03-31 NOTE — Telephone Encounter (Signed)
Printed, signed and faxed.  

## 2018-04-03 ENCOUNTER — Encounter: Payer: Self-pay | Admitting: Internal Medicine

## 2018-04-04 ENCOUNTER — Telehealth: Payer: Self-pay | Admitting: Gastroenterology

## 2018-04-04 NOTE — Telephone Encounter (Signed)
Pt received recall letter for colon. She had a gastric bypass surgery last April and wants to know if she should wait to have colon. Pls call her.

## 2018-04-04 NOTE — Telephone Encounter (Signed)
(  For documentation purposes: She had a small adenomatous polyp removed 02/2013)  Whenever she feels ready to do it.  It is routine polyp surveillance, so can definitely wait until later this year or early next year if she would prefer to wait.

## 2018-04-04 NOTE — Telephone Encounter (Signed)
Spoke to patient to let her know that she can certainly wait until she is ready either at the end of this year or early next. She will call back in a few days.

## 2018-04-04 NOTE — Telephone Encounter (Signed)
Routed to Dr. Danis. 

## 2018-04-05 ENCOUNTER — Other Ambulatory Visit: Payer: Self-pay | Admitting: Internal Medicine

## 2018-04-05 MED ORDER — FLUOXETINE HCL 20 MG PO TABS: 20.0000 mg | ORAL_TABLET | Freq: Every day | ORAL | 1 refills | Status: DC

## 2018-04-13 ENCOUNTER — Other Ambulatory Visit: Payer: Self-pay | Admitting: Internal Medicine

## 2018-04-24 ENCOUNTER — Other Ambulatory Visit: Payer: Self-pay | Admitting: Internal Medicine

## 2018-04-26 ENCOUNTER — Other Ambulatory Visit: Payer: Self-pay | Admitting: Internal Medicine

## 2018-04-27 ENCOUNTER — Encounter: Payer: Self-pay | Admitting: Internal Medicine

## 2018-05-02 ENCOUNTER — Ambulatory Visit: Payer: BC Managed Care – PPO | Admitting: Internal Medicine

## 2018-05-02 ENCOUNTER — Encounter: Payer: Self-pay | Admitting: Internal Medicine

## 2018-05-02 VITALS — BP 112/80 | HR 100 | Temp 97.8°F | Resp 15 | Ht 62.0 in | Wt 195.4 lb

## 2018-05-02 DIAGNOSIS — Z9884 Bariatric surgery status: Secondary | ICD-10-CM

## 2018-05-02 DIAGNOSIS — E559 Vitamin D deficiency, unspecified: Secondary | ICD-10-CM

## 2018-05-02 DIAGNOSIS — Z1211 Encounter for screening for malignant neoplasm of colon: Secondary | ICD-10-CM

## 2018-05-02 DIAGNOSIS — F419 Anxiety disorder, unspecified: Secondary | ICD-10-CM

## 2018-05-02 DIAGNOSIS — F5105 Insomnia due to other mental disorder: Secondary | ICD-10-CM

## 2018-05-02 DIAGNOSIS — K644 Residual hemorrhoidal skin tags: Secondary | ICD-10-CM

## 2018-05-02 DIAGNOSIS — Z9889 Other specified postprocedural states: Secondary | ICD-10-CM | POA: Insufficient documentation

## 2018-05-02 MED ORDER — DIBUCAINE 1 % RE OINT: 1.0000 "application " | TOPICAL_OINTMENT | RECTAL | 0 refills | Status: DC | PRN

## 2018-05-02 MED ORDER — HYDROCORTISONE ACE-PRAMOXINE 1-1 % RE FOAM: 1.0000 | Freq: Two times a day (BID) | RECTAL | 0 refills | Status: DC

## 2018-05-02 NOTE — Assessment & Plan Note (Signed)
Chronic,managed with clonazepam and mirapex

## 2018-05-02 NOTE — Progress Notes (Signed)
Subjective:  Patient ID: Selena [name], female    DOB: 01-06-1966  Age: 52 y.o. MRN: [id number]  CC: The primary encounter diagnosis was Colon cancer screening. 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   Selena [name] presents for follow up on multiple issues   1) painful external hemorrhoid,  Not thrombosed by exam .  Has tried sitz baths,  Stool softeners.    2) Obesity:  She has lost  51 lbs since bariatric surgery. By [facility] Surgeon Dr Hassell Done in [place]   Surgeon's goal is 120  Lbs , her goal is 135. Marland Kitchen  Art therapist  For management of  FOOD ADDICTION .  Patient happy with surgery but states that follow up was unsastifactriy and she "would never recommend [facility] to anyone" because she was discharged the following day and was never called after surgery for follow up.  At one point she called [facility] numerous times due to concern and fear due to an unforseen incisional opening and was never called back. By the time she was called for any follow up, she had already returned to work.  She has not been scheduled for any surveillance  labs.     She is exercising 3 days per week.  Appetite has not increased.  Following the dietary plan which is vegetables and lean proteins.  No fruit.  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 buPROPion (WELLBUTRIN) 100 MG tablet TAKE 1 TABLET BY MOUTH TWICE A DAY FOR 2 WEEKS, THEN TAKE 2 TABLETS TWICE A DAY 90 tablet 1  . calcium gluconate 500 MG tablet Take 1 tablet by mouth 3 (three) times daily.    . cetirizine (ZYRTEC) 10 MG tablet Take 10 mg by mouth daily  as needed for allergies.     . clonazePAM (KLONOPIN) 1 MG tablet Take 1 tablet (1 mg total) by mouth at bedtime. As needed for insomnia 30 tablet 5  . diazepam (VALIUM) 10 MG tablet TAKE 1 TABLET BY MOUTH EVERY DAY AS NEEDED FOR ANXIETY AND MUSCLE SPASMS 15 tablet 3  . FLUoxetine (PROZAC) 20 MG tablet Take 1 tablet (20 mg total) by mouth daily. 90 tablet 1  . fluticasone (FLONASE) 50 MCG/ACT nasal spray Place 2 sprays into both nostrils daily. (Patient taking differently: Place 2 sprays into both nostrils daily as needed for allergies (stuffy nose). ) 16 g 2  . levonorgestrel (MIRENA) 20 MCG/24HR IUD 1 Intra Uterine Device (1 each total) by Intrauterine route once. 1 each 0  . Multiple Vitamins-Iron (MULTI-VITAMIN/IRON PO) Take 1 tablet by mouth 2 (two) times daily.    Marland Kitchen omeprazole (PRILOSEC OTC) 20 MG tablet Take 20 mg by mouth daily before breakfast.     . pramipexole (MIRAPEX) 0.125 MG tablet TAKE 1 TABLET BY MOUTH DAILY AT BEDTIME. INCREASE WEEKLY AS NEEDED 90 tablet 1  . rizatriptan (MAXALT) 10 MG tablet TAKE 1 TABLET (10 MG TOTAL) BY MOUTH AS NEEDED FOR MIGRAINE. MAY REPEAT IN 2 HOURS IF NEEDED 10 tablet 2  . topiramate (TOPAMAX) 100 MG tablet TAKE 1 TABLET (100 MG TOTAL) BY MOUTH DAILY. (Patient taking differently: Take 100 mg by  mouth at bedtime. ) 90 tablet 1   No facility-administered medications prior to visit.     Review of Systems;  Patient denies headache, fevers, malaise, unintentional weight loss, skin rash, eye pain, sinus congestion and sinus pain, sore throat, dysphagia,  hemoptysis , cough, dyspnea, wheezing, chest pain, palpitations, orthopnea, edema, abdominal pain, nausea, melena, diarrhea, constipation, flank pain, dysuria, hematuria, urinary  Frequency, nocturia, numbness, tingling, seizures,  Focal weakness, Loss of consciousness,  Tremor, insomnia, depression, anxiety, and suicidal ideation.      Objective:  BP 112/80 (BP Location: Left Arm, Patient Position: Sitting,  Cuff Size: Normal)   Pulse 100   Temp 97.8 F (36.6 C) (Oral)   Resp 15   Ht 5\' 2"  (1.575 m)   Wt 195 lb 6.4 oz (88.6 kg)   SpO2 98%   BMI 35.74 kg/m   BP Readings from Last 3 Encounters:  05/02/18 112/80  01/25/18 122/72  01/19/18 132/88    Wt Readings from Last 3 Encounters:  05/02/18 195 lb 6.4 oz (88.6 kg)  03/22/18 203 lb 9.6 oz (92.4 kg)  02/14/18 218 lb 6.4 oz (99.1 kg)    General appearance: alert, cooperative and appears stated age Ears: normal TM's and external ear canals both ears Throat: lips, mucosa, and tongue normal; teeth and gums normal Neck: no adenopathy, no carotid bruit, supple, symmetrical, trachea midline and thyroid not enlarged, symmetric, no tenderness/mass/nodules Back: symmetric, no curvature. ROM normal. No CVA tenderness. Lungs: clear to auscultation bilaterally Heart: regular rate and rhythm, S1, S2 normal, no murmur, click, rub or gallop Abdomen: soft, non-tender; bowel sounds normal; no masses,  no organomegaly Pulses: 2+ and symmetric Skin: Skin color, texture, turgor normal. No rashes or lesions Lymph nodes: Cervical, supraclavicular, and axillary nodes normal. Rectal:  Small firm but not thrombosed external hemorrhoid,  Tender to palpation  Psych: affect normal, makes good eye contact. No fidgeting,  Smiles easily.  Denies suicidal thoughts   Lab Results  Component Value Date   HGBA1C 5.8 05/17/2017   HGBA1C 5.5 05/21/2016   HGBA1C 5.8 04/16/2014    Lab Results  Component Value Date   CREATININE 0.90 01/24/2018   CREATININE 0.85 01/19/2018   CREATININE 0.86 11/12/2017    Lab Results  Component Value Date   WBC 15.4 (H) 01/25/2018   HGB 12.6 01/25/2018   HCT 37.5 01/25/2018   PLT 292 01/25/2018   GLUCOSE 90 01/19/2018   CHOL 236 (H) 05/17/2017   TRIG 128.0 05/17/2017   HDL 53.40 05/17/2017   LDLDIRECT 171 (H) 05/21/2016   LDLCALC 157 (H) 05/17/2017   ALT 51 01/19/2018   AST 31 01/19/2018   NA 135 01/19/2018   K 3.6  01/19/2018   CL 106 01/19/2018   CREATININE 0.90 01/24/2018   BUN 19 01/19/2018   CO2 20 (L) 01/19/2018   TSH 1.18 09/20/2017   INR 1.0 07/13/2008   HGBA1C 5.8 05/17/2017   MICROALBUR 0.50 06/17/2012    No results found.  Assessment & Plan:   Problem List Items Addressed This Visit    Vitamin D deficiency    Pre existing , prior to bariatric surgery ,  Repeat  level pending        Relevant Orders   VITAMIN D 25 Hydroxy (Vit-D Deficiency, Fractures)   Prolapsed external hemorrhoids    Not thrombosed on exam today ,  Proctofoam and nupercainal advised.  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    continue counselling, will obtain follow up labs here including vitamin D,  ince  Vitamin  K and iron       Insomnia secondary to anxiety    Chronic,managed with clonazepam and mirapex       Other Visit Diagnoses    Colon cancer screening    -  Primary   Relevant Orders   Ambulatory referral to Gastroenterology   External hemorrhoids without complication       Relevant Orders   Ambulatory referral to Gastroenterology   S/P bariatric surgery       Relevant Orders   CBC with Differential/Platelet   Comprehensive metabolic panel   Vitamin J03   Zinc   Magnesium   Iron, TIBC and Ferritin Panel   Vitamin K1, Serum      A total of 25 minutes of face to face time was spent with patient more than half of which was spent in counselling about the above mentioned conditions  and coordination of care   I am having [name] Appleby start on hydrocortisone-pramoxine and dibucaine. I am also having her maintain her cetirizine, levonorgestrel, fluticasone, omeprazole, topiramate, clonazePAM, rizatriptan, buPROPion, diazepam, FLUoxetine, pramipexole, calcium gluconate, and Multiple Vitamins-Iron (MULTI-VITAMIN/IRON PO).  Meds ordered this  encounter  Medications  . hydrocortisone-pramoxine (PROCTOFOAM HC) rectal foam    Sig: Place 1 applicator rectally 2 (two) times daily.    Dispense:  10 g    Refill:  0  . dibucaine (NUPERCAINAL) 1 % OINT    Sig: Place 1 application rectally as needed for hemorrhoids.    Dispense:  28 g    Refill:  0    There are no discontinued medications.  Follow-up: Return in about 3 months (around 08/02/2018).   [name], MD

## 2018-05-02 NOTE — Assessment & Plan Note (Signed)
Pre existing , prior to bariatric surgery ,  Repeat  level pending

## 2018-05-02 NOTE — Assessment & Plan Note (Signed)
Not thrombosed on exam today ,  Proctofoam and nupercainal advised.  Referral to [facility] for definitive treatment

## 2018-05-02 NOTE — Patient Instructions (Addendum)
You look fantastic!  Now that you have time,  Get your exercise up to at least 4 days per week   Metamucil, citrucel or miralax ok to take daily  Nupercainal ointment as needed (may be OTC but rx sent)  proctofoam   internal/exgernal foam use twice daily  (may be otc but rx sent)     for hemorhoids and colonoscopy:  [facility] referral

## 2018-05-02 NOTE — Assessment & Plan Note (Signed)
51 lb weight loss thus far.    continue counselling, will obtain follow up labs here including vitamin D,  ince  Vitamin  K and iron

## 2018-05-06 ENCOUNTER — Other Ambulatory Visit (INDEPENDENT_AMBULATORY_CARE_PROVIDER_SITE_OTHER): Payer: BC Managed Care – PPO

## 2018-05-06 DIAGNOSIS — E559 Vitamin D deficiency, unspecified: Secondary | ICD-10-CM

## 2018-05-06 DIAGNOSIS — Z9884 Bariatric surgery status: Secondary | ICD-10-CM

## 2018-05-06 LAB — HEMOGLOBIN A1C: Hgb A1c MFr Bld: 5.7 % (ref 4.6–6.5)

## 2018-05-06 LAB — CBC WITH DIFFERENTIAL/PLATELET
Basophils Absolute: 0 10*3/uL (ref 0.0–0.1)
Basophils Relative: 0.7 % (ref 0.0–3.0)
Eosinophils Absolute: 0.2 10*3/uL (ref 0.0–0.7)
Eosinophils Relative: 4.4 % (ref 0.0–5.0)
HCT: 44 % (ref 36.0–46.0)
Hemoglobin: 14.9 g/dL (ref 12.0–15.0)
Lymphocytes Relative: 34.2 % (ref 12.0–46.0)
Lymphs Abs: 1.9 10*3/uL (ref 0.7–4.0)
MCHC: 33.8 g/dL (ref 30.0–36.0)
MCV: 95.9 fl (ref 78.0–100.0)
Monocytes Absolute: 0.3 10*3/uL (ref 0.1–1.0)
Monocytes Relative: 6 % (ref 3.0–12.0)
Neutro Abs: 3 10*3/uL (ref 1.4–7.7)
Neutrophils Relative %: 54.7 % (ref 43.0–77.0)
Platelets: 282 10*3/uL (ref 150.0–400.0)
RBC: 4.59 Mil/uL (ref 3.87–5.11)
RDW: 12.5 % (ref 11.5–15.5)
WBC: 5.5 10*3/uL (ref 4.0–10.5)

## 2018-05-06 LAB — LIPID PANEL
Cholesterol: 170 mg/dL (ref 0–200)
HDL: 39.4 mg/dL (ref >39.00)
LDL Cholesterol: 113 mg/dL — ABNORMAL HIGH (ref 0–99)
NonHDL: 130.53
Total CHOL/HDL Ratio: 4
Triglycerides: 88 mg/dL (ref 0.0–149.0)
VLDL: 17.6 mg/dL (ref 0.0–40.0)

## 2018-05-06 LAB — COMPREHENSIVE METABOLIC PANEL
ALT: 64 U/L — ABNORMAL HIGH (ref 0–35)
AST: 35 U/L (ref 0–37)
Albumin: 4.3 g/dL (ref 3.5–5.2)
Alkaline Phosphatase: 72 U/L (ref 39–117)
BUN: 11 mg/dL (ref 6–23)
CO2: 22 mEq/L (ref 19–32)
Calcium: 9.2 mg/dL (ref 8.4–10.5)
Chloride: 107 mEq/L (ref 96–112)
Creatinine, Ser: 0.89 mg/dL (ref 0.40–1.20)
GFR: 71.34 mL/min (ref >60.00)
Glucose, Bld: 113 mg/dL — ABNORMAL HIGH (ref 70–99)
Potassium: 3.6 mEq/L (ref 3.5–5.1)
Sodium: 137 mEq/L (ref 135–145)
Total Bilirubin: 0.4 mg/dL (ref 0.2–1.2)
Total Protein: 6.4 g/dL (ref 6.0–8.3)

## 2018-05-06 LAB — VITAMIN D 25 HYDROXY (VIT D DEFICIENCY, FRACTURES): VITD: 19.38 ng/mL — ABNORMAL LOW (ref 30.00–100.00)

## 2018-05-06 LAB — MAGNESIUM: Magnesium: 2.2 mg/dL (ref 1.5–2.5)

## 2018-05-06 LAB — VITAMIN B12: Vitamin B-12: 388 pg/mL (ref 211–911)

## 2018-05-07 ENCOUNTER — Other Ambulatory Visit: Payer: Self-pay | Admitting: Internal Medicine

## 2018-05-07 DIAGNOSIS — R748 Abnormal levels of other serum enzymes: Secondary | ICD-10-CM

## 2018-05-07 MED ORDER — ERGOCALCIFEROL 1.25 MG (50000 UT) PO CAPS: 50000.0000 [IU] | ORAL_CAPSULE | ORAL | 3 refills | Status: DC

## 2018-05-14 ENCOUNTER — Encounter: Payer: Self-pay | Admitting: Family Medicine

## 2018-05-14 ENCOUNTER — Ambulatory Visit: Payer: BC Managed Care – PPO | Admitting: Family Medicine

## 2018-05-14 VITALS — BP 124/80 | HR 99 | Temp 97.8°F | Resp 14 | Ht 62.0 in | Wt 195.0 lb

## 2018-05-14 DIAGNOSIS — T63461A Toxic effect of venom of wasps, accidental (unintentional), initial encounter: Secondary | ICD-10-CM | POA: Diagnosis not present

## 2018-05-14 DIAGNOSIS — T63441A Toxic effect of venom of bees, accidental (unintentional), initial encounter: Secondary | ICD-10-CM

## 2018-05-14 DIAGNOSIS — Z23 Encounter for immunization: Secondary | ICD-10-CM

## 2018-05-14 DIAGNOSIS — T63451A Toxic effect of venom of hornets, accidental (unintentional), initial encounter: Secondary | ICD-10-CM

## 2018-05-14 LAB — IRON,TIBC AND FERRITIN PANEL

## 2018-05-14 LAB — VITAMIN K1, SERUM: Vitamin K: <50 pg/mL — ABNORMAL LOW (ref 80–1160)

## 2018-05-14 LAB — ZINC: Zinc: 64 ug/dL (ref 60–130)

## 2018-05-14 MED ORDER — PREDNISONE 10 MG PO TABS: ORAL_TABLET | ORAL | 0 refills | Status: DC

## 2018-05-14 NOTE — Patient Instructions (Signed)
Please follow up if symptoms do not improve or as needed.   Take zyrtec 58m daily for the next week.  Use benadryl 262mevery 4-6 hours as needed for lip swelling, tingling or numbness.  Ice the lip off and on for the next 24 hours or so.  Go to ER for any swelling of the tongue or shortness of breath.  You may consider taking the prednisone IF the swelling worsens significantly over the next 24-48 hours; however, you may not need it. Your symptoms can last up to a week.   We updated your Tdap today as requested. We can repeat this in 10 years.   Bee, Wasp, or [employer] Bees, wasps, and hornets are part of a family of insects that can sting people. These stings can cause pain and inflammation, but they are usually not serious. However, some people may have an allergic reaction to a sting. This can cause the symptoms to be more severe. What increases the risk? You may be at a greater risk of getting stung if you:  Provoke a stinging insect by swatting or disturbing it.  Wear strong-smelling soaps, deodorants, or body sprays.  Spend time outdoors near gardens with flowers or fruit trees or in clothes that expose skin.  Eat or drink outside.  What are the signs or symptoms? Common symptoms of this condition include:  A red lump in the skin that sometimes has a tiny hole in the center. In some cases, a stinger may be in the center of the wound.  Pain and itching at the sting site.  Redness and swelling around the sting site. If you have an allergic reaction (localized allergic reaction), the swelling and redness may spread out from the sting site. In some cases, this reaction can continue to develop over the next 24-48 hours.  In rare cases, a person may have a severe allergic reaction (anaphylactic reaction) to a sting. Symptoms of an anaphylactic reaction may include:  Wheezing or difficulty breathing.  Raised, itchy, red patches on the skin (hives).  Nausea or  vomiting.  Abdominal cramping.  Diarrhea.  Tightness in the chest or chest pain.  Dizziness or fainting.  Redness of the face (flushing).  Hoarse voice.  Swollen tongue, lips, or face.  How is this diagnosed? This condition is usually diagnosed based on your symptoms and medical history as well as a physical exam. You may have an allergy test to determine if you are allergic to the substance that the insect injected during the sting (venom). How is this treated? If you were stung by a bee, the stinger and a small sac of venom may be in the wound. It is important to remove the stinger as soon as possible. You can do this by brushing across the wound with gauze, a fingernail, or a flat card such as a credit card. Removing the stinger can help reduce the severity of your body's reaction to the sting. Most stings can be treated with:  Icing to reduce swelling in the area.  Medicines (antihistamines) to treat itching or an allergic reaction.  Medicines to help reduce pain. These may be medicines that you take by mouth, or medicated creams or lotions that you apply to your skin.  Pay close attention to your symptoms after you have been stung. If possible, have someone stay with you to make sure you do not have an allergic reaction. If you have any signs of an allergic reaction, call your health care  provider. If you have ever had a severe allergic reaction, your health care provider may give you an inhaler or injectable medicine (epinephrine auto-injector) to use if necessary. Follow these instructions at home:  Wash the sting site 2-3 times each day with soap and water as told by your health care provider.  Apply or take over-the-counter and prescription medicines only as told by your health care provider.  If directed, apply ice to the sting area. ? Put ice in a plastic bag. ? Place a towel between your skin and the bag. ? Leave the ice on for 20 minutes, 2-3 times a day.  Do not  scratch the sting area.  If you had a severe allergic reaction to a sting, you may need: ? To wear a medical bracelet or necklace that lists the allergy. ? To learn when and how to use an anaphylaxis kit or epinephrine injection. Your family members and coworkers may also need to learn this. ? To carry an anaphylaxis kit or epinephrine injection with you at all times. How is this prevented?  Avoid swatting at stinging insects and disturbing insect nests.  Do not use fragrant soaps or lotions.  Wear shoes, pants, and long sleeves when spending time outdoors, especially in grassy areas where stinging insects are common.  Keep outdoor areas free from nests or hives.  Keep food and drink containers covered when eating outdoors.  Avoid working or sitting near [employer], if possible.  Wear gloves if you are gardening or working outdoors.  If an attack by a stinging insect or a swarm seems likely in the moment, move away from the area or find a barrier between you and the insect(s), such as a door. Contact a health care provider if:  Your symptoms do not get better in 2-3 days.  You have redness, swelling, or pain that spreads beyond the area of the sting.  You have a fever. Get help right away if: You have symptoms of a severe allergic reaction. These include:  Wheezing or difficulty breathing.  Tightness in the chest or chest pain.  Light-headedness or fainting.  Itchy, raised, red patches on the skin.  Nausea or vomiting.  Abdominal cramping.  Diarrhea.  A swollen tongue or lips, or trouble swallowing.  Dizziness or fainting.  Summary  Stings from bees, wasps, and hornets can cause pain and inflammation, but they are usually not serious. However, some people may have an allergic reaction to a sting. This can cause the symptoms to be more severe.  Pay close attention to your symptoms after you have been stung. If possible, have someone stay with you to make  sure you do not have an allergic reaction.  Call your health care provider if you have any signs of an allergic reaction. This information is not intended to replace advice given to you by your health care provider. Make sure you discuss any questions you have with your health care provider. Document Released: 09/21/2005 Document Revised: 11/26/2016 Document Reviewed: 11/26/2016 Elsevier Interactive Patient Education  [employer].

## 2018-05-14 NOTE — Progress Notes (Signed)
Subjective  CC:  Chief Complaint  Patient presents with  . Insect Bite    HPI: Selena [name] is a 52 y.o. female who presents to the office today to address the problems listed above in the chief complaint.  Was stung twice on left lower lip and chin today about 2 hours ago by a wasp. Had acute swelling. Some paresthesias. No tongue involvement. Breathing fine. Swelling is resolving after taking benadryl. No h/o angioedema. Wants tdap.    Assessment  1. Toxic reaction to hornets, wasps and bees, accidental or unintentional, initial encounter   2. Allergic reaction to bee sting   3. Need for Tdap vaccination      Plan   sting:  Education given. Localized allergic reaction. Treat with zyrtec and benadryl. Warning sxs reviewed. pred only if worsening.   tdap updated  Follow up:    Orders Placed This Encounter  Procedures  . Tdap vaccine greater than or equal to 7yo IM   Meds ordered this encounter  Medications  . predniSONE (DELTASONE) 10 MG tablet    Sig: Take 4 tabs qd x 2 days, 3 qd x 2 days, 2 qd x 2d, 1qd x 3 days    Dispense:  21 tablet    Refill:  0      I reviewed the patients updated PMH, FH, and SocHx.    Patient Active Problem List   Diagnosis Date Noted  . Prolapsed external hemorrhoids 05/02/2018  . 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 Chronic depressive disorder 01/15/2018  . 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 Paroxysmal tachycardia ([place]) 10/27/2017  . Constipation by delayed colonic transit 09/21/2017  . SVT (supraventricular tachycardia) ([place]) 07/10/2017  . Allergic conjunctivitis and rhinitis 10/01/2016  . Atypical chest pain 12/22/2015  . Restless legs syndrome 04/27/2015  . Insomnia secondary to anxiety 12/22/2014  . IBS (irritable bowel syndrome) 04/21/2014  . Hyperlipidemia 04/21/2014  . Vitamin D deficiency 04/21/2014  . Preoperative evaluation to rule out surgical contraindication 04/21/2014  . Cervicalgia 07/25/2013  . Morbid obesity  ([place]) 07/25/2013  . Generalized anxiety disorder 07/25/2013  . 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 Snoring disorder 03/11/2013  . Fatty liver 08/20/2011  . 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 KNEE PAIN, BILATERAL 04/01/2010  . Migraine without aura 05/30/2008  . HYPERTENSION 05/30/2008  . ASTHMA, EXERCISE INDUCED 05/30/2008  . PREMENSTRUAL DYSPHORIC SYNDROME 05/30/2008  . Fibromyalgia 05/30/2008   Current Meds  Medication Sig  . buPROPion (WELLBUTRIN) 100 MG tablet TAKE 1 TABLET BY MOUTH TWICE A DAY FOR 2 WEEKS, THEN TAKE 2 TABLETS TWICE A DAY  . calcium gluconate 500 MG tablet Take 1 tablet by mouth 3 (three) times daily.  . cetirizine (ZYRTEC) 10 MG tablet Take 10 mg by mouth daily as needed for allergies.   . clonazePAM (KLONOPIN) 1 MG tablet Take 1 tablet (1 mg total) by mouth at bedtime. As needed for insomnia  . diazepam (VALIUM) 10 MG tablet TAKE 1 TABLET BY MOUTH EVERY DAY AS NEEDED FOR ANXIETY AND MUSCLE SPASMS  . dibucaine (NUPERCAINAL) 1 % OINT Place 1 application rectally as needed for hemorrhoids.  . ergocalciferol (DRISDOL) 50000 units capsule Take 1 capsule (50,000 Units total) by mouth once a week.  Marland Kitchen FLUoxetine (PROZAC) 20 MG tablet Take 1 tablet (20 mg total) by mouth daily.  . fluticasone (FLONASE) 50 MCG/ACT nasal spray Place 2 sprays into both nostrils daily. (Patient taking differently: Place 2 sprays into both nostrils daily as needed for  allergies (stuffy nose). )  . hydrocortisone-pramoxine (PROCTOFOAM HC) rectal foam Place 1 applicator rectally 2 (two) times daily.  Marland Kitchen levonorgestrel (MIRENA) 20 MCG/24HR IUD 1 Intra Uterine Device (1 each total) by Intrauterine route once.  . Multiple Vitamins-Iron (MULTI-VITAMIN/IRON PO) Take 1 tablet by mouth 2 (two) times daily.  Marland Kitchen omeprazole (PRILOSEC OTC) 20 MG tablet Take 20 mg by mouth daily before breakfast.   . pramipexole (MIRAPEX) 0.125 MG tablet TAKE 1 TABLET BY MOUTH DAILY AT BEDTIME. INCREASE WEEKLY AS NEEDED    . rizatriptan (MAXALT) 10 MG tablet TAKE 1 TABLET (10 MG TOTAL) BY MOUTH AS NEEDED FOR MIGRAINE. MAY REPEAT IN 2 HOURS IF NEEDED  . topiramate (TOPAMAX) 100 MG tablet TAKE 1 TABLET (100 MG TOTAL) BY MOUTH DAILY. (Patient taking differently: Take 100 mg by mouth at bedtime. )    Allergies: Patient is allergic to fish allergy; augmentin [amoxicillin-pot clavulanate]; eggs or egg-derived products; and sulfonamide derivatives. Family History: Patient family history includes Arrhythmia in her maternal grandfather, maternal grandmother, and mother; Arthritis in her paternal grandmother; Cancer in her mother and sister; Diabetes in her father; Heart disease in her maternal grandmother and mother; Hyperlipidemia in her mother; Hypertension in her maternal grandmother; Hypothyroidism in her sister; Mental illness in her father. Social History:  Patient  reports that she has never smoked. She has never used smokeless tobacco. She reports that she drinks alcohol. She reports that she does not use drugs.  Review of Systems: Constitutional: Negative for fever malaise or anorexia Cardiovascular: negative for chest pain Respiratory: negative for SOB or persistent cough Gastrointestinal: negative for abdominal pain  Objective  Vitals: BP 124/80 (BP Location: Left Arm, Patient Position: Sitting, Cuff Size: Large)   Pulse 99   Temp 97.8 F (36.6 C) (Oral)   Resp 14   Ht 5\' 2"  (1.575 m)   Wt 195 lb (88.5 kg)   SpO2 96%   BMI 35.67 kg/m  General: no acute distress , A&Ox3 no respiratory distress, well appearing HEENT: PEERL, conjunctiva normal, Oropharynx moist,neck is supple, left lip and chin with bee sting sites and swelling. Normal tongue and posterior pharynx Cardiovascular:  RRR without murmur or gallop.  Respiratory:  Good breath sounds bilaterally, CTAB with normal respiratory effort Skin:  Warm, no rashes     Commons side effects, risks, benefits, and alternatives for medications and  treatment plan prescribed today were discussed, and the patient expressed understanding of the given instructions. Patient is instructed to call or message via MyChart if he/she has any questions or concerns regarding our treatment plan. No barriers to understanding were identified. We discussed Red Flag symptoms and signs in detail. Patient expressed understanding regarding what to do in case of urgent or emergency type symptoms.   Medication list was reconciled, printed and provided to the patient in AVS. Patient instructions and summary information was reviewed with the patient as documented in the AVS. This note was prepared with assistance of Dragon voice recognition software. Occasional wrong-word or sound-a-like substitutions may have occurred due to the inherent limitations of voice recognition software

## 2018-05-25 ENCOUNTER — Other Ambulatory Visit: Payer: Self-pay | Admitting: Internal Medicine

## 2018-05-30 ENCOUNTER — Ambulatory Visit: Payer: Self-pay | Admitting: Gastroenterology

## 2018-05-30 ENCOUNTER — Ambulatory Visit: Payer: BC Managed Care – PPO | Admitting: Gastroenterology

## 2018-05-30 ENCOUNTER — Encounter: Payer: Self-pay | Admitting: Gastroenterology

## 2018-05-30 VITALS — BP 100/70 | HR 72 | Ht 61.81 in | Wt 188.2 lb

## 2018-05-30 DIAGNOSIS — K5909 Other constipation: Secondary | ICD-10-CM

## 2018-05-30 DIAGNOSIS — K602 Anal fissure, unspecified: Secondary | ICD-10-CM

## 2018-05-30 MED ORDER — AMBULATORY NON FORMULARY MEDICATION: 1 refills | Status: DC

## 2018-05-30 NOTE — Progress Notes (Signed)
Start GI Progress Note  Chief Complaint: Rectal pain and bleeding  Subjective  History:  Arrington sees me for rectal pain and bleeding that has been going on for the last couple of months.  She developed constipation after gastric bypass several months ago.  She then had a period of severe anorectal pain and bleeding which primary care suspected was hemorrhoidal.  As she was scheduled to see [place] for a surveillance colonoscopy, she made an office appointment instead for evaluation.  She had a 3 mm tubular adenoma in May 2014 and is due for a surveillance colonoscopy sometime this year. Daksha started MiraLAX with relief of constipation and decrease in both pain and bleeding.  She still finds it uncomfortable to sit at times and there is pain with bowel movements, she has not had any bleeding for the last couple of weeks.  ROS: Cardiovascular:  no chest pain Respiratory: no dyspnea  The patient's Past Medical, Family and Social History were reviewed and are on file in the EMR. Past Medical History:  Diagnosis Date  . Anemia, iron deficiency   . Anxiety   . Asthma   . Colon polyps 2014  . Complication of anesthesia    per patient report , woke during endoscopy in 2014 ; no issues with subsequent colonscopy also  in 2014   . Costochondritis    srturggled wiht it since age 68 ; exacerbates with excessive movement of left arm; did phsycial therapy in 08-2017 and hasnt had issues with it since   . Depression   . Eating disorder    Binge eating  . Fibromyalgia   . Gallstones   . Heart murmur    at birth; resolved   . Hyperlipidemia   . Hypertension    was due to birth control  , " as soon as they took it out, it went away  "    Past Surgical History:  Procedure Laterality Date  . CHOLECYSTECTOMY    . COLONOSCOPY     [facility] dr Deatra Ina   . 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 GANGLION CYST EXCISION    . GASTRIC ROUX-EN-Y N/A 01/24/2018   Procedure: LAPAROSCOPIC ROUX-EN-Y GASTRIC BYPASS WITH UPPER ENDOSCOPY;  Surgeon: Johnathan Hausen, MD;  Location: [facility];  Service: General;  Laterality: N/A;  . TONSILLECTOMY  1989    Objective:  Med list reviewed  Current Outpatient Medications:  .  buPROPion (WELLBUTRIN) 100 MG tablet, TAKE 1 TABLET BY MOUTH TWICE A DAY FOR 2 WEEKS, THEN TAKE 2 TABLETS TWICE A DAY, Disp: 90 tablet, Rfl: 1 .  calcium gluconate 500 MG tablet, Take 1 tablet by mouth 3 (three) times daily., Disp: , Rfl:  .  cetirizine (ZYRTEC) 10 MG tablet, Take 10 mg by mouth daily as needed for allergies. , Disp: , Rfl:  .  clonazePAM (KLONOPIN) 1 MG tablet, Take 1 tablet (1 mg total) by mouth at bedtime. As needed for insomnia, Disp: 30 tablet, Rfl: 5 .  diazepam (VALIUM) 10 MG tablet, TAKE 1 TABLET BY MOUTH EVERY DAY AS NEEDED FOR ANXIETY AND MUSCLE SPASMS, Disp: 15 tablet, Rfl: 3 .  dibucaine (NUPERCAINAL) 1 % OINT, Place 1 application rectally as needed for hemorrhoids., Disp: 28 g, Rfl: 0 .  ergocalciferol (DRISDOL) 50000 units capsule, Take 1 capsule (50,000 Units total) by mouth once a week., Disp: 12 capsule, Rfl: 3 .  FLUoxetine (PROZAC) 20 MG tablet, Take 1 tablet (20 mg total) by  mouth daily., Disp: 90 tablet, Rfl: 1 .  fluticasone (FLONASE) 50 MCG/ACT nasal spray, Place 2 sprays into both nostrils daily. (Patient taking differently: Place 2 sprays into both nostrils daily as needed for allergies (stuffy nose). ), Disp: 16 g, Rfl: 2 .  hydrocortisone-pramoxine (PROCTOFOAM HC) rectal foam, Place 1 applicator rectally 2 (two) times daily. (Patient taking differently: Place 1 applicator rectally as needed. ), Disp: 10 g, Rfl: 0 .  levonorgestrel (MIRENA) 20 MCG/24HR IUD, 1 Intra Uterine Device (1 each total) by Intrauterine route once., Disp: 1 each, Rfl: 0 .  Multiple Vitamins-Iron (MULTI-VITAMIN/IRON PO), Take 1 tablet by mouth 2 (two) times daily., Disp: , Rfl:  .  omeprazole (PRILOSEC OTC) 20 MG tablet, Take 20 mg by  mouth daily before breakfast. , Disp: , Rfl:  .  pramipexole (MIRAPEX) 0.125 MG tablet, TAKE 1 TABLET BY MOUTH DAILY AT BEDTIME. INCREASE WEEKLY AS NEEDED, Disp: 90 tablet, Rfl: 1 .  rizatriptan (MAXALT) 10 MG tablet, TAKE 1 TABLET (10 MG TOTAL) BY MOUTH AS NEEDED FOR MIGRAINE. MAY REPEAT IN 2 HOURS IF NEEDED, Disp: 10 tablet, Rfl: 2 .  topiramate (TOPAMAX) 100 MG tablet, TAKE 1 TABLET (100 MG TOTAL) BY MOUTH DAILY., Disp: 90 tablet, Rfl: 1 .  AMBULATORY NON FORMULARY MEDICATION, Nitroglycerine ointment 0.125 %  Apply a pea sized amount internally three times daily. Dispense 30 GM zero refill, Disp: 30 g, Rfl: 1   Vital signs in last 24 hrs: Vitals:   05/30/18 1612  BP: 100/70  Pulse: 72    Physical Exam  Well-appearing  HEENT: sclera anicteric, oral mucosa moist without lesions  Neck: supple, no thyromegaly, JVD or lymphadenopathy  Cardiac: RRR without murmurs, S1S2 heard, no peripheral edema  Pulm: clear to auscultation bilaterally, normal RR and effort noted  Abdomen: soft, no tenderness, with active bowel sounds. No guarding or palpable hepatosplenomegaly.  Skin; warm and dry, no jaundice or rash Rectal: Sentinel pile and tender posterior anal fissure    CBC Latest Ref Rng & Units 05/06/2018 01/25/2018 01/24/2018  WBC 4.0 - 10.5 K/uL 5.5 15.4(H) 15.1(H)  Hemoglobin 12.0 - 15.0 g/dL 14.9 12.6 13.3  Hematocrit 36.0 - 46.0 % 44.0 37.5 39.8  Platelets 150.0 - 400.0 K/uL 282.0 292 280     @ASSESSMENTPLANBEGIN @ Assessment: Encounter Diagnoses  Name Primary?  Marland Kitchen Anal fissure Yes  . Chronic constipation    Constipation since gastric bypass, probably related to iron-containing multivitamin and decreasing dietary fiber.  It has led to an anal fissure causing pain and bleeding.  She will continue MiraLAX, adjusting dose as needed to have 1 or 2 soft BMs per day. Instructions given for lidocaine and nitroglycerin ointment 3 times daily Follow-up in about 6 weeks.  Defer  colonoscopy until healing of anal fissure.  Total time 20 minutes, over half spent face-to-face with patient in counseling and coordination of care.   Nelida Meuse III

## 2018-05-30 NOTE — Patient Instructions (Addendum)
If you are age 50 or older, your body mass index should be between 23-30. Your Body mass index is 34.64 kg/m. If this is out of the aforementioned range listed, please consider follow up with your Primary Care Provider.  If you are age 60 or younger, your body mass index should be between 19-25. Your Body mass index is 34.64 kg/m. If this is out of the aformentioned range listed, please consider follow up with your Primary Care Provider.    Patient Drug Education for Nitroglycerin Ointment  Nitroglycerin ointment (NTG) is used to help heal anal fissures. The ointment relaxes the smooth muscle around the anus and promotes blood flow which helps heal the fissure (tear). The NTG reduces anal canal pressure, which diminishes pain and spasm. We use a diluted concentration of NTG (.125%) compared to the 2% that is typically used for heart patients, and this is why you need to obtain the medication from a pharmacy which will compound your prescription.  The NTG ointment should be applied 3 times per day, or as directed.  A pea-sized drop should be placed on the tip of your finger and then gently placed inside the anus. The finger should be inserted 1/3 - 1/2 its length and may be covered with a plastic glove or finger cot. You may use Vaseline to help coat the finger or dilute the ointment. If you are advised to mix the NTG with steroid ointment, limit the steroids to one to two weeks.  The first few applications should be taken lying down, as mild light-headedness or a brief headache may occur.  It may take several weeks for the fissure to begin healing, and you will need to continue taking the medication after resolution of your symptoms.  It is important to continue the treatment for the entire time period - up to 3 months or as directed. It takes up to two years for the healing tissue to regain the normal skin strength. You will be advised to add fiber to your diet, increase water intake to 7-8 glasses  per day, take relaxing baths or sitz baths, and avoid prolonged sitting and straining on the commode. Local anesthetic ointment may be added.  Initially, the anal fissure is very inflamed, which allows more of the NTG to get into the blood. This allows for a higher incidence of the most common side effect - a headache. It is usually brief and mild, but may require Tylenol or Advil. You may dilute the NTG further with Vaseline to decrease the headaches. As the treatment progresses and the fissure begins to heal, the headaches will tend to dissipate. Other side effects include lightheadedness, flushing, dizziness, nervousness, nausea, and vomiting. If any of these side effects persist or worsen, notify us promptly. Stop using the NTG and notify us immediately if you develop the rare side effects of severe dizziness, fainting, fast/pounding heartbeat, paleness, sweating, blurred vision, dry mouth, dark urine, bluish lips/skin/nails, unusual tiredness, severe weakness, irregular heartbeat, seizures, or chest pain. Serious allergic reactions are unusual, but seek immediate medical attention if you develop a rash, swelling, dizziness, or trouble breathing.  Tell us if you are allergic to nitrates, have severe anemia, low blood pressure, dehydration, chronic heart failure, cardiomyopathy, recent heart attack, increased pressure in the brain, or exposure to nitrates while on the job. Do not use NTG while driving or working around machinery if you are drowsy, dizzy, have lightheadedness, or blurred vision. Limit alcoholic beverages. To minimize dizziness and lightheadedness, get  up slowly when rising from a sitting or lying position. The elderly may be more prone to dizziness and falling. While there are not adequate studies to confirm the safety of NTG in pregnant or breast feeding women, it has been used without incident so far. We recommend waiting at least one hour after applying the NTG ointment before breast  feeding.  Do not use NTG ointment if you are taking drugs for sexual problems [e.g., sildenafil (Viagra), tadalafil (Cialis), vardenafil (Levitra)]. Use caution before taking cough-and-cold products, diet aids, or NSAIDs preparations because they may contain ingredients that could increase your blood pressure, cause a fast heartbeat, or increase chest pain (e.g., pseudoephedrine, phenylephrine, chlorpheniramine, diphenhydramine, clemastine, ibuprofen, and naproxen). Tell us if you drink alcohol, take alteplase, migraine drugs (ergotamine), water pills/diuretics such as furosemide or hydrochlorothiazide, or other drugs for high blood pressure (beta blockers, calcium channel blockers, ACE inhibitors).  Store the NTG at room temperature and keep away from light and moisture. Close the container tightly after each use. Do not store in the bathroom. Keep away from children and pets. If you have any questions or problems please call us at  [phone].   It was a pleasure to see you today!  Dr. Loletha Carrow

## 2018-06-02 ENCOUNTER — Telehealth: Payer: Self-pay | Admitting: Gastroenterology

## 2018-06-02 NOTE — Telephone Encounter (Signed)
I am afraid pain medicine will make her more constipated, which will worsen the fissure.  She can and should use the Recticare as often as needed.    Nitroglycerin ointment three times daily to relieve spasm.  Please help arrange an appointment with surgery next week if they are available.

## 2018-06-02 NOTE — Telephone Encounter (Signed)
Called patient again, no answer. I left a message with Dr. Loletha Carrow' recommendations and will fax over referral to [place].

## 2018-06-02 NOTE — Telephone Encounter (Signed)
Tried to contact patient, no answer, wanted to know if she is using the NTG ointment. Message routed to Dr. Loletha Carrow.

## 2018-06-07 ENCOUNTER — Telehealth: Payer: Self-pay

## 2018-06-07 NOTE — Telephone Encounter (Signed)
Spoke to patient, she did have more rectal bleeding today. She denies any SOB, dizziness. She had been traveling over the holiday weekend and noticed the blood this morning. I had already placed a referral to [facility] for anal fissure, called and lvm for Judson Roch to see if they could get her worked in this week to be seen. Best contact for patient is her mobile number.

## 2018-06-07 NOTE — Telephone Encounter (Signed)
Updated information re: appointment.

## 2018-06-07 NOTE — Telephone Encounter (Signed)
-----   Message from Dow Adolph sent at 06/07/2018  2:13 PM EDT ----- [facility] called and patient is scheduled to see Dr. Hassell Done in the [facility] office on 06/09/18 @ 11:00.   Patient is aware of appt.

## 2018-06-15 NOTE — Patient Instructions (Addendum)
Selena [name]  06/15/2018   Your procedure is scheduled on: 06/24/2018   Report to [facility] Main  Entrance              Report to admitting at    200pm    Call this number if you have problems the morning of surgery [phone]    Remember: Do not eat food  :After Midnight. May have clear liquids from 12 midnite until 1000am morning of surgery then nothing by mouth.      CLEAR LIQUID DIET   Foods Allowed                                                                     Foods Excluded  Coffee and tea, regular and decaf                             liquids that you cannot  Plain Jell-O in any flavor                                             see through such as: Fruit ices (not with fruit pulp)                                     milk, soups, orange juice  Iced Popsicles                                    All solid food Carbonated beverages, regular and diet                                    Cranberry, grape and apple juices Sports drinks like Gatorade Lightly seasoned clear broth or consume(fat free) Sugar, honey syrup  Sample Menu Breakfast                                Lunch                                     Supper Cranberry juice                    Beef broth                            Chicken broth Jell-O                                     Grape juice  Apple juice Coffee or tea                        Jell-O                                      Popsicle                                                Coffee or tea                        Coffee or tea  _____________________________________________________________________     Take these medicines the morning of surgery with A SIP OF WATER : Wellbutrin,  Zyrtec if needed, Valium if needed, Prozac, Flonase if needed,  Prilosec, Topamax :                                 You may not have any metal on your body including hair pins and              piercings  Do not  wear jewelry, make-up, lotions, powders or perfumes, deodorant             Do not wear nail polish.  Do not shave  48 hours prior to surgery.                Do not bring valuables to the hospital. [place].  Contacts, dentures or bridgework may not be worn into surgery. .     Patients discharged the day of surgery will not be allowed to drive home.  Name and phone number of your driver:  Special Instructions: N/A              Please read over the following fact sheets you were given: _____________________________________________________________________             [facility] - Preparing for Surgery Before surgery, you can play an important role.  Because skin is not sterile, your skin needs to be as free of germs as possible.  You can reduce the number of germs on your skin by washing with CHG (chlorahexidine gluconate) soap before surgery.  CHG is an antiseptic cleaner which kills germs and bonds with the skin to continue killing germs even after washing. Please DO NOT use if you have an allergy to CHG or antibacterial soaps.  If your skin becomes reddened/irritated stop using the CHG and inform your nurse when you arrive at Short Stay. Do not shave (including legs and underarms) for at least 48 hours prior to the first CHG shower.  You may shave your face/neck. Please follow these instructions carefully:  1.  Shower with CHG Soap the night before surgery and the  morning of Surgery.  2.  If you choose to wash your hair, wash your hair first as usual with your  normal  shampoo.  3.  After you shampoo, rinse your hair and body thoroughly to remove the  shampoo.  4.  Use CHG as you would any other liquid soap.  You can apply chg directly  to the skin and wash                       Gently with a scrungie or clean washcloth.  5.  Apply the CHG Soap to your body ONLY FROM THE NECK DOWN.   Do not use on face/ open                            Wound or open sores. Avoid contact with eyes, ears mouth and genitals (private parts).                       Wash face,  Genitals (private parts) with your normal soap.             6.  Wash thoroughly, paying special attention to the area where your surgery  will be performed.  7.  Thoroughly rinse your body with warm water from the neck down.  8.  DO NOT shower/wash with your normal soap after using and rinsing off  the CHG Soap.                9.  Pat yourself dry with a clean towel.            10.  Wear clean pajamas.            11.  Place clean sheets on your bed the night of your first shower and do not  sleep with pets. Day of Surgery : Do not apply any lotions/deodorants the morning of surgery.  Please wear clean clothes to the hospital/surgery center.  FAILURE TO FOLLOW THESE INSTRUCTIONS MAY RESULT IN THE CANCELLATION OF YOUR SURGERY PATIENT SIGNATURE_________________________________  NURSE SIGNATURE__________________________________  ________________________________________________________________________

## 2018-06-15 NOTE — Progress Notes (Signed)
EKg-10/27/17-epic  CXR-09/27/17-epic  HGBA1c- 05/06/18-epic

## 2018-06-15 NOTE — Progress Notes (Signed)
Need orders in epic.  Surgery on 06/24/18.  Proep on 06/16/2018 at 330pm.

## 2018-06-16 ENCOUNTER — Other Ambulatory Visit: Payer: Self-pay

## 2018-06-16 ENCOUNTER — Encounter (HOSPITAL_COMMUNITY)
Admission: RE | Admit: 2018-06-16 | Discharge: 2018-06-16 | Disposition: A | Discharge status: Home / Self Care | Payer: BC Managed Care – PPO | Source: Ambulatory Visit | Attending: Surgery | Admitting: Surgery

## 2018-06-16 ENCOUNTER — Encounter (HOSPITAL_COMMUNITY): Payer: Self-pay

## 2018-06-16 DIAGNOSIS — Z01812 Encounter for preprocedural laboratory examination: Secondary | ICD-10-CM | POA: Diagnosis present

## 2018-06-16 LAB — BASIC METABOLIC PANEL
Anion gap: 6 (ref 5–15)
BUN: 20 mg/dL (ref 6–20)
CO2: 23 mmol/L (ref 22–32)
Calcium: 9 mg/dL (ref 8.9–10.3)
Chloride: 111 mmol/L (ref 98–111)
Creatinine, Ser: 0.79 mg/dL (ref 0.44–1.00)
GFR calc Af Amer: >60 mL/min (ref >60)
GFR calc non Af Amer: >60 mL/min (ref >60)
Glucose, Bld: 94 mg/dL (ref 70–99)
Potassium: 3.3 mmol/L — ABNORMAL LOW (ref 3.5–5.1)
Sodium: 140 mmol/L (ref 135–145)

## 2018-06-16 LAB — CBC
HCT: 37.7 % (ref 36.0–46.0)
Hemoglobin: 13 g/dL (ref 12.0–15.0)
MCH: 31.5 pg (ref 26.0–34.0)
MCHC: 34.5 g/dL (ref 30.0–36.0)
MCV: 91.3 fL (ref 78.0–100.0)
Platelets: 316 10*3/uL (ref 150–400)
RBC: 4.13 MIL/uL (ref 3.87–5.11)
RDW: 12.8 % (ref 11.5–15.5)
WBC: 6.9 10*3/uL (ref 4.0–10.5)

## 2018-06-16 LAB — HCG, SERUM, QUALITATIVE: Preg, Serum: NEGATIVE

## 2018-06-16 LAB — GLUCOSE, CAPILLARY: Glucose-Capillary: 158 mg/dL — ABNORMAL HIGH (ref 70–99)

## 2018-06-22 ENCOUNTER — Other Ambulatory Visit: Payer: Self-pay | Admitting: Internal Medicine

## 2018-06-22 MED ORDER — FLUOXETINE HCL 20 MG PO TABS: 60.0000 mg | ORAL_TABLET | Freq: Every day | ORAL | 5 refills | Status: DC

## 2018-06-23 NOTE — Progress Notes (Signed)
No orders one day prior to surgery. Charge RN Bridget Hartshorn, RN requested orders from Dr. Hassell Done

## 2018-06-24 ENCOUNTER — Encounter (HOSPITAL_COMMUNITY): Payer: Self-pay | Admitting: *Deleted

## 2018-06-24 ENCOUNTER — Other Ambulatory Visit: Payer: Self-pay

## 2018-06-24 ENCOUNTER — Ambulatory Visit (HOSPITAL_COMMUNITY)
Admission: RE | Admit: 2018-06-24 | Discharge: 2018-06-24 | Disposition: A | Discharge status: Home / Self Care | Payer: BC Managed Care – PPO | Source: Ambulatory Visit | Attending: Surgery | Admitting: Surgery

## 2018-06-24 ENCOUNTER — Ambulatory Visit (HOSPITAL_COMMUNITY): Payer: BC Managed Care – PPO | Admitting: Anesthesiology

## 2018-06-24 ENCOUNTER — Encounter (HOSPITAL_COMMUNITY)
Admission: RE | Disposition: A | Discharge status: Home / Self Care | Payer: Self-pay | Source: Ambulatory Visit | Attending: Surgery

## 2018-06-24 DIAGNOSIS — E785 Hyperlipidemia, unspecified: Secondary | ICD-10-CM | POA: Insufficient documentation

## 2018-06-24 DIAGNOSIS — F411 Generalized anxiety disorder: Secondary | ICD-10-CM | POA: Diagnosis not present

## 2018-06-24 DIAGNOSIS — Z6833 Body mass index (BMI) 33.0-33.9, adult: Secondary | ICD-10-CM | POA: Insufficient documentation

## 2018-06-24 DIAGNOSIS — I10 Essential (primary) hypertension: Secondary | ICD-10-CM | POA: Insufficient documentation

## 2018-06-24 DIAGNOSIS — J45909 Unspecified asthma, uncomplicated: Secondary | ICD-10-CM | POA: Diagnosis not present

## 2018-06-24 DIAGNOSIS — Z9884 Bariatric surgery status: Secondary | ICD-10-CM | POA: Insufficient documentation

## 2018-06-24 DIAGNOSIS — F329 Major depressive disorder, single episode, unspecified: Secondary | ICD-10-CM | POA: Insufficient documentation

## 2018-06-24 DIAGNOSIS — K594 Anal spasm: Secondary | ICD-10-CM | POA: Insufficient documentation

## 2018-06-24 DIAGNOSIS — M797 Fibromyalgia: Secondary | ICD-10-CM | POA: Insufficient documentation

## 2018-06-24 DIAGNOSIS — I471 Supraventricular tachycardia: Secondary | ICD-10-CM | POA: Diagnosis not present

## 2018-06-24 DIAGNOSIS — K644 Residual hemorrhoidal skin tags: Secondary | ICD-10-CM | POA: Insufficient documentation

## 2018-06-24 DIAGNOSIS — K589 Irritable bowel syndrome without diarrhea: Secondary | ICD-10-CM | POA: Insufficient documentation

## 2018-06-24 DIAGNOSIS — G43909 Migraine, unspecified, not intractable, without status migrainosus: Secondary | ICD-10-CM | POA: Diagnosis not present

## 2018-06-24 HISTORY — PX: EVALUATION UNDER ANESTHESIA WITH HEMORRHOIDECTOMY: SHX5624

## 2018-06-24 HISTORY — PX: SPHINCTEROTOMY: SHX5279

## 2018-06-24 SURGERY — EXAM UNDER ANESTHESIA WITH HEMORRHOIDECTOMY
Anesthesia: General

## 2018-06-24 MED ORDER — HYDROCODONE-ACETAMINOPHEN 5-325 MG PO TABS: 1.0000 | ORAL_TABLET | Freq: Four times a day (QID) | ORAL | 0 refills | Status: DC | PRN

## 2018-06-24 MED ORDER — HYDROMORPHONE HCL 1 MG/ML IJ SOLN: 0.2500 mg | INTRAMUSCULAR | Status: DC | PRN

## 2018-06-24 MED ORDER — MIDAZOLAM HCL 2 MG/2ML IJ SOLN
INTRAMUSCULAR | Status: AC
  Filled 2018-06-24: qty 2

## 2018-06-24 MED ORDER — OXYCODONE HCL 5 MG/5ML PO SOLN
5.0000 mg | Freq: Once | ORAL | Status: DC | PRN
  Filled 2018-06-24: qty 5

## 2018-06-24 MED ORDER — LIDOCAINE 2% (20 MG/ML) 5 ML SYRINGE
INTRAMUSCULAR | Status: DC | PRN
  Administered 2018-06-24: 100 mg via INTRAVENOUS

## 2018-06-24 MED ORDER — ONDANSETRON HCL 4 MG/2ML IJ SOLN
INTRAMUSCULAR | Status: DC | PRN
  Administered 2018-06-24: 4 mg via INTRAVENOUS

## 2018-06-24 MED ORDER — CHLORHEXIDINE GLUCONATE CLOTH 2 % EX PADS: 6.0000 | MEDICATED_PAD | Freq: Once | CUTANEOUS | Status: DC

## 2018-06-24 MED ORDER — PROPOFOL 10 MG/ML IV BOLUS
INTRAVENOUS | Status: DC | PRN
  Administered 2018-06-24: 160 mg via INTRAVENOUS

## 2018-06-24 MED ORDER — MIDAZOLAM HCL 5 MG/5ML IJ SOLN
INTRAMUSCULAR | Status: DC | PRN
  Administered 2018-06-24: 2 mg via INTRAVENOUS

## 2018-06-24 MED ORDER — BUPIVACAINE LIPOSOME 1.3 % IJ SUSP
INTRAMUSCULAR | Status: DC | PRN
  Administered 2018-06-24: 20 mL

## 2018-06-24 MED ORDER — LIDOCAINE 2% (20 MG/ML) 5 ML SYRINGE
INTRAMUSCULAR | Status: AC
  Filled 2018-06-24: qty 5

## 2018-06-24 MED ORDER — OXYCODONE HCL 5 MG PO TABS: 5.0000 mg | ORAL_TABLET | Freq: Once | ORAL | Status: DC | PRN

## 2018-06-24 MED ORDER — LACTATED RINGERS IV SOLN
INTRAVENOUS | Status: DC
  Administered 2018-06-24: 15:00:00 via INTRAVENOUS

## 2018-06-24 MED ORDER — FENTANYL CITRATE (PF) 250 MCG/5ML IJ SOLN
INTRAMUSCULAR | Status: AC
  Filled 2018-06-24: qty 5

## 2018-06-24 MED ORDER — BUPIVACAINE LIPOSOME 1.3 % IJ SUSP
20.0000 mL | Freq: Once | INTRAMUSCULAR | Status: DC
  Filled 2018-06-24: qty 20

## 2018-06-24 MED ORDER — PROPOFOL 10 MG/ML IV BOLUS
INTRAVENOUS | Status: AC
  Filled 2018-06-24: qty 20

## 2018-06-24 MED ORDER — SODIUM CHLORIDE 0.9 % IV SOLN
2.0000 g | INTRAVENOUS | Status: AC
  Administered 2018-06-24: 2 g via INTRAVENOUS
  Filled 2018-06-24: qty 2

## 2018-06-24 MED ORDER — FENTANYL CITRATE (PF) 100 MCG/2ML IJ SOLN
INTRAMUSCULAR | Status: DC | PRN
  Administered 2018-06-24 (×2): 100 ug via INTRAVENOUS
  Administered 2018-06-24: 50 ug via INTRAVENOUS

## 2018-06-24 MED ORDER — PROMETHAZINE HCL 25 MG/ML IJ SOLN: 6.2500 mg | INTRAMUSCULAR | Status: DC | PRN

## 2018-06-24 MED ORDER — DEXAMETHASONE SODIUM PHOSPHATE 10 MG/ML IJ SOLN
INTRAMUSCULAR | Status: DC | PRN
  Administered 2018-06-24: 10 mg via INTRAVENOUS

## 2018-06-24 SURGICAL SUPPLY — 36 items
BLADE HEX COATED 2.75 (ELECTRODE) ×3 IMPLANT
BLADE SURG 15 STRL LF DISP TIS (BLADE) ×1 IMPLANT
BLADE SURG 15 STRL SS (BLADE) ×3
BRIEF STRETCH FOR OB PAD LRG (UNDERPADS AND DIAPERS) ×3 IMPLANT
COVER SURGICAL LIGHT HANDLE (MISCELLANEOUS) ×3 IMPLANT
DECANTER SPIKE VIAL GLASS SM (MISCELLANEOUS) ×3 IMPLANT
DRSG PAD ABDOMINAL 8X10 ST (GAUZE/BANDAGES/DRESSINGS) IMPLANT
ELECT PENCIL ROCKER SW 15FT (MISCELLANEOUS) ×3 IMPLANT
ELECT REM PT RETURN 15FT ADLT (MISCELLANEOUS) ×3 IMPLANT
GAUZE 4X4 16PLY RFD (DISPOSABLE) ×3 IMPLANT
GAUZE SPONGE 4X4 12PLY STRL (GAUZE/BANDAGES/DRESSINGS) ×3 IMPLANT
GLOVE BIOGEL M 8.0 STRL (GLOVE) ×3 IMPLANT
GLOVE BIOGEL PI IND STRL 7.0 (GLOVE) ×1 IMPLANT
GLOVE BIOGEL PI INDICATOR 7.0 (GLOVE) ×2
GOWN SPEC L4 XLG W/TWL (GOWN DISPOSABLE) ×3 IMPLANT
GOWN STRL REUS W/TWL LRG LVL3 (GOWN DISPOSABLE) ×3 IMPLANT
GOWN STRL REUS W/TWL XL LVL3 (GOWN DISPOSABLE) ×9 IMPLANT
KIT BASIN OR (CUSTOM PROCEDURE TRAY) ×3 IMPLANT
LUBRICANT JELLY K Y 4OZ (MISCELLANEOUS) ×3 IMPLANT
NDL HYPO 25X1 1.5 SAFETY (NEEDLE) ×1 IMPLANT
NDL SAFETY ECLIPSE 18X1.5 (NEEDLE) IMPLANT
NEEDLE HYPO 18GX1.5 SHARP (NEEDLE)
NEEDLE HYPO 22GX1.5 SAFETY (NEEDLE) ×3 IMPLANT
NEEDLE HYPO 25X1 1.5 SAFETY (NEEDLE) ×3 IMPLANT
NS IRRIG 1000ML POUR BTL (IV SOLUTION) ×3 IMPLANT
PACK LITHOTOMY IV (CUSTOM PROCEDURE TRAY) ×3 IMPLANT
SHEARS HARMONIC 9CM CVD (BLADE) IMPLANT
SPONGE SURGIFOAM ABS GEL 100 (HEMOSTASIS) IMPLANT
SPONGE SURGIFOAM ABS GEL 12-7 (HEMOSTASIS) IMPLANT
SUT CHROMIC 2 0 SH (SUTURE) IMPLANT
SUT CHROMIC 3 0 SH 27 (SUTURE) IMPLANT
SYR 20CC LL (SYRINGE) ×3 IMPLANT
SYR CONTROL 10ML LL (SYRINGE) ×3 IMPLANT
TOWEL OR 17X26 10 PK STRL BLUE (TOWEL DISPOSABLE) ×3 IMPLANT
UNDERPAD 30X30 (UNDERPADS AND DIAPERS) ×3 IMPLANT
YANKAUER SUCT BULB TIP 10FT TU (MISCELLANEOUS) ×3 IMPLANT

## 2018-06-24 NOTE — Anesthesia Preprocedure Evaluation (Addendum)
Anesthesia Evaluation  Patient identified by MRN, date of birth, ID band Patient awake    Reviewed: Allergy & Precautions, NPO status , Patient's Chart, lab work & pertinent test results  Airway Mallampati: II  TM Distance: >3 FB Neck ROM: Full    Dental no notable dental hx.    Pulmonary neg pulmonary ROS,    Pulmonary exam normal breath sounds clear to auscultation       Cardiovascular negative cardio ROS Normal cardiovascular exam Rhythm:Regular Rate:Normal     Neuro/Psych  Headaches, PSYCHIATRIC DISORDERS Anxiety Depression    GI/Hepatic Neg liver ROS, GERD  Medicated and Controlled,S/p GASTRIC ROUX-EN-Y   Endo/Other  negative endocrine ROS  Renal/GU negative Renal ROS     Musculoskeletal  (+) Fibromyalgia -  Abdominal (+) + obese,   Peds  Hematology negative hematology ROS (+)   Anesthesia Other Findings ANAL FISSURE WITH EXTERNAL HEMMORHOIDS  Reproductive/Obstetrics                            Anesthesia Physical Anesthesia Plan  ASA: II  Anesthesia Plan: General   Post-op Pain Management:    Induction: Intravenous  PONV Risk Score and Plan: 3 and Ondansetron, Dexamethasone, Midazolam and Treatment may vary due to age or medical condition  Airway Management Planned: LMA  Additional Equipment:   Intra-op Plan:   Post-operative Plan: Extubation in OR  Informed Consent: I have reviewed the patients History and Physical, chart, labs and discussed the procedure including the risks, benefits and alternatives for the proposed anesthesia with the patient or authorized representative who has indicated his/her understanding and acceptance.   Dental advisory given  Plan Discussed with: CRNA  Anesthesia Plan Comments:         Anesthesia Quick Evaluation

## 2018-06-24 NOTE — Discharge Instructions (Signed)
How to Take a Sitz Bath °A sitz bath is a warm water bath that is taken while you are sitting down. The water should only come up to your hips and should cover your buttocks. Your health care provider may recommend a sitz bath to help you: °· Clean the lower part of your body, including your genital area. °· With itching. °· With pain. °· With sore muscles or muscles that tighten or spasm. ° °How to take a sitz bath °Take 3-4 sitz baths per day or as told by your health care provider. °1. Partially fill a bathtub with warm water. You will only need the water to be deep enough to cover your hips and buttocks when you are sitting in it. °2. If your health care provider told you to put medicine in the water, follow the directions exactly. °3. Sit in the water and open the tub drain a little. °4. Turn on the warm water again to keep the tub at the correct level. Keep the water running constantly. °5. Soak in the water for 15-20 minutes or as told by your health care provider. °6. After the sitz bath, pat the affected area dry first. Do not rub it. °7. Be careful when you stand up after the sitz bath because you may feel dizzy. ° °Contact a health care provider if: °· Your symptoms get worse. Do not continue with sitz baths if your symptoms get worse. °· You have new symptoms. Do not continue with sitz baths until you talk with your health care provider. °This information is not intended to replace advice given to you by your health care provider. Make sure you discuss any questions you have with your health care provider. °Document Released: 06/13/2004 Document Revised: 02/19/2016 Document Reviewed: 09/19/2014 °Elsevier Interactive Patient Education © 2018 Elsevier Inc. ° °

## 2018-06-24 NOTE — Op Note (Signed)
Selena [name]  1966-02-18 20 Sept 2017    PCP:  [name], MD   Surgeon: Kaylyn Lim, MD, FACS  Asst:  none  Anes:  general  Preop Dx: Hx of proctalgia fugax and external hemorrhoids Postop Dx: same  Procedure: Excision of external hemorrhoids and left lateral internal sphincterotomy Location Surgery: [facility] 4 Complications: None noted  EBL:   minimal cc  Drains: none  Description of Procedure:  The patient was taken to OR 4 .  After anesthesia was administered and the patient was prepped  with Technicare in the dorsal lithotomy position and a timeout was performed.  The two external hemorrhoids at the 7 oclock position were excised with the Bovie.  EUA was performed and a left lateral internal sphincterotomy was performed on the distal 1 cm.    The patient tolerated the procedure well and was taken to the PACU in stable condition.     Matt B. Hassell Done, [place], [facility], [place]

## 2018-06-24 NOTE — Anesthesia Procedure Notes (Signed)
Procedure Name: LMA Insertion Date/Time: 06/24/2018 4:16 PM Performed by: Leland Staszewski D, CRNA Pre-anesthesia Checklist: Patient identified, Emergency Drugs available, Suction available and Patient being monitored Patient Re-evaluated:Patient Re-evaluated prior to induction Oxygen Delivery Method: Circle system utilized Preoxygenation: Pre-oxygenation with 100% oxygen Induction Type: IV induction Ventilation: Mask ventilation without difficulty LMA: LMA inserted LMA Size: 4.0 Tube type: Oral Number of attempts: 1 Placement Confirmation: ETT inserted through vocal cords under direct vision,  positive ETCO2 and breath sounds checked- equal and bilateral Tube secured with: Tape Dental Injury: Teeth and Oropharynx as per pre-operative assessment

## 2018-06-24 NOTE — Anesthesia Postprocedure Evaluation (Signed)
Anesthesia Post Note  Patient: Selena [name]  Procedure(s) Performed: EXAM UNDER ANESTHESIA WITH EXTERNAL HEMORRHOIDECTOMY (N/A ) LATERAL INTERNAL SPHINCTEROTOMY (N/A )     Patient location during evaluation: PACU Anesthesia Type: General Level of consciousness: awake and alert Pain management: pain level controlled Vital Signs Assessment: post-procedure vital signs reviewed and stable Respiratory status: spontaneous breathing, nonlabored ventilation, respiratory function stable and patient connected to nasal cannula oxygen Cardiovascular status: blood pressure returned to baseline and stable Postop Assessment: no apparent nausea or vomiting Anesthetic complications: no    Last Vitals:  Vitals:   06/24/18 1733 06/24/18 1800  BP:  107/72  Pulse:  87  Resp:  16  Temp: 36.9 C 36.9 C  SpO2:  98%    Last Pain:  Vitals:   06/24/18 1800  TempSrc:   PainSc: 0-No pain                 [name] [name]

## 2018-06-24 NOTE — H&P (Signed)
Chief Complaint:  Proctalgia fugax  History of Present Illness:  Selena [name] is an 52 y.o. female with painful hemorrhoid and proctalgia fugax.  For EUA and sphincterotomy.  Past Medical History:  Diagnosis Date  . Anemia, iron deficiency   . Anxiety   . Asthma   . Colon polyps 2014  . Complication of anesthesia    per patient report , woke during endoscopy in 2014 ; no issues with subsequent colonscopy also  in 2014   . Costochondritis    srturggled wiht it since age 12 ; exacerbates with excessive movement of left arm; did phsycial therapy in 08-2017 and hasnt had issues with it since   . Depression   . Eating disorder    Binge eating  . Fibromyalgia   . Gallstones   . Heart murmur    at birth; resolved   . Hyperlipidemia   . Hypertension    was due to birth control  , " as soon as they took it out, it went away  "     Past Surgical History:  Procedure Laterality Date  . CHOLECYSTECTOMY    . COLONOSCOPY     [facility] dr Deatra Ina   . 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 GANGLION CYST EXCISION    . GASTRIC ROUX-EN-Y N/A 01/24/2018   Procedure: LAPAROSCOPIC ROUX-EN-Y GASTRIC BYPASS WITH UPPER ENDOSCOPY;  Surgeon: Johnathan Hausen, MD;  Location: [facility] ORS;  Service: General;  Laterality: N/A;  . TONSILLECTOMY  1989    Current Facility-Administered Medications  Medication Dose Route Frequency Provider Last Rate Last Dose  . bupivacaine liposome (EXPAREL) 1.3 % injection 266 mg  20 mL Infiltration Once Johnathan Hausen, MD      . Derrill Memo ON 06/25/2018] cefoTEtan (CEFOTAN) 2 g in sodium chloride 0.9 % 100 mL IVPB  2 g Intravenous On Call to OR Johnathan Hausen, MD      . Chlorhexidine Gluconate Cloth 2 % PADS 6 each  6 each Topical Once Johnathan Hausen, MD       And  . Chlorhexidine Gluconate Cloth 2 % PADS 6 each  6 each Topical Once Johnathan Hausen, MD      . lactated ringers infusion   Intravenous Continuous Duane Boston, MD 50 mL/hr at 06/24/18 1450      Fish allergy; Augmentin [amoxicillin-pot clavulanate]; Eggs or egg-derived products; and Sulfonamide derivatives Family History  Problem Relation Age of Onset  . Hyperlipidemia Mother   . Heart disease Mother        Atrial fibrilation  . Cancer Mother        ? Melanoma  . Arrhythmia Mother   . Diabetes Father   . Mental illness Father        Bipolar  . Cancer Sister        Clear cell sarcoma in leg  . Hypothyroidism Sister   . Hypertension Maternal Grandmother   . Heart disease Maternal Grandmother        Afib  . Arrhythmia Maternal Grandmother   . Arthritis Paternal Grandmother   . Arrhythmia Maternal Grandfather   . Colon cancer Neg Hx   . Rectal cancer Neg Hx    Social History:   reports that she has never smoked. She has never used smokeless tobacco. She reports that she drinks alcohol. She reports that she does not use drugs.   REVIEW OF SYSTEMS : Negative except for prior bariatric surgery  Physical Exam:   Blood pressure 117/79, pulse 75,  temperature 98.2 F (36.8 C), temperature source Oral, resp. rate 16, height 5\' 2"  (1.575 m), weight 83.4 kg, SpO2 100 %. Body mass index is 33.62 kg/m.  Gen:  WDWN WF NAD  Neurological: Alert and oriented to person, place, and time. Motor and sensory function is grossly intact  Head: Normocephalic and atraumatic.  Eyes: Conjunctivae are normal. Pupils are equal, round, and reactive to light. No scleral icterus.  Neck: Normal range of motion. Neck supple. No tracheal deviation or thyromegaly present.  Cardiovascular:  SR without murmurs or gallops.  No carotid bruits Breast:  Not examined Respiratory: Effort normal.  No respiratory distress. No chest wall tenderness. Breath sounds normal.  No wheezes, rales or rhonchi.  Abdomen:  nontender GU:  External hemorrhoid Musculoskeletal: Normal range of motion. Extremities are nontender. No cyanosis, edema or clubbing noted Lymphadenopathy: No cervical, preauricular, postauricular  or axillary adenopathy is present Skin: Skin is warm and dry. No rash noted. No diaphoresis. No erythema. No pallor. Pscyh: Normal mood and affect. Behavior is normal. Judgment and thought content normal.   LABORATORY RESULTS: No results found for this or any previous visit (from the past 48 hour(s)).   RADIOLOGY RESULTS: No results found.  Problem List: Patient Active Problem List   Diagnosis Date Noted  . Prolapsed external hemorrhoids 05/02/2018  . 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 Chronic depressive disorder 01/15/2018  . 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 Paroxysmal tachycardia ([place]) 10/27/2017  . Constipation by delayed colonic transit 09/21/2017  . SVT (supraventricular tachycardia) ([place]) 07/10/2017  . Allergic conjunctivitis and rhinitis 10/01/2016  . Atypical chest pain 12/22/2015  . Restless legs syndrome 04/27/2015  . Insomnia secondary to anxiety 12/22/2014  . IBS (irritable bowel syndrome) 04/21/2014  . Hyperlipidemia 04/21/2014  . Vitamin D deficiency 04/21/2014  . Preoperative evaluation to rule out surgical contraindication 04/21/2014  . Cervicalgia 07/25/2013  . Morbid obesity ([place]) 07/25/2013  . Generalized anxiety disorder 07/25/2013  . 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 Snoring disorder 03/11/2013  . Fatty liver 08/20/2011  . 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 KNEE PAIN, BILATERAL 04/01/2010  . Migraine without aura 05/30/2008  . HYPERTENSION 05/30/2008  . ASTHMA, EXERCISE INDUCED 05/30/2008  . PREMENSTRUAL DYSPHORIC SYNDROME 05/30/2008  . Fibromyalgia 05/30/2008    Assessment & Plan: External hemorrhoid and proctalgia fugax    Matt B. Hassell Done, MD, [facility] [phone] beeper [phone]  06/24/2018 3:36 PM

## 2018-06-24 NOTE — Transfer of Care (Signed)
Immediate Anesthesia Transfer of Care Note  Patient: Selena [name]  Procedure(s) Performed: EXAM UNDER ANESTHESIA WITH EXTERNAL HEMORRHOIDECTOMY (N/A ) LATERAL INTERNAL SPHINCTEROTOMY (N/A )  Patient Location: PACU  Anesthesia Type:General  Level of Consciousness: awake, alert  and oriented  Airway & Oxygen Therapy: Patient Spontanous Breathing and Patient connected to face mask oxygen  Post-op Assessment: Report given to RN and Post -op Vital signs reviewed and stable  Post vital signs: Reviewed and stable  Last Vitals:  Vitals Value Taken Time  BP 110/48 06/24/2018  4:55 PM  Temp 36.6 C 06/24/2018  4:55 PM  Pulse 92 06/24/2018  4:55 PM  Resp 16 06/24/2018  4:55 PM  SpO2    Vitals shown include unvalidated device data.  Last Pain:  Vitals:   06/24/18 1441  TempSrc:   PainSc: 0-No pain      Patients Stated Pain Goal: 3 (35/24/81 8590)  Complications: No apparent anesthesia complications

## 2018-06-25 ENCOUNTER — Encounter (HOSPITAL_COMMUNITY): Payer: Self-pay | Admitting: Surgery

## 2018-07-07 ENCOUNTER — Other Ambulatory Visit: Payer: Self-pay | Admitting: Internal Medicine

## 2018-07-07 NOTE — Telephone Encounter (Signed)
Script  to increase to 2 tablets twice daily , ? To send in new script

## 2018-07-12 ENCOUNTER — Ambulatory Visit: Payer: Self-pay

## 2018-07-13 ENCOUNTER — Ambulatory Visit: Payer: Self-pay | Admitting: Gastroenterology

## 2018-07-15 ENCOUNTER — Telehealth: Payer: BC Managed Care – PPO | Admitting: Nurse Practitioner

## 2018-07-15 DIAGNOSIS — J0100 Acute maxillary sinusitis, unspecified: Secondary | ICD-10-CM

## 2018-07-15 MED ORDER — DOXYCYCLINE HYCLATE 100 MG PO TABS: 100.0000 mg | ORAL_TABLET | Freq: Two times a day (BID) | ORAL | 0 refills | Status: DC

## 2018-07-15 NOTE — Progress Notes (Signed)

## 2018-07-18 ENCOUNTER — Other Ambulatory Visit (INDEPENDENT_AMBULATORY_CARE_PROVIDER_SITE_OTHER): Payer: BC Managed Care – PPO

## 2018-07-18 DIAGNOSIS — R748 Abnormal levels of other serum enzymes: Secondary | ICD-10-CM | POA: Diagnosis not present

## 2018-07-19 LAB — HEPATIC FUNCTION PANEL
ALT: 79 U/L — ABNORMAL HIGH (ref 0–35)
AST: 41 U/L — ABNORMAL HIGH (ref 0–37)
Albumin: 4.3 g/dL (ref 3.5–5.2)
Alkaline Phosphatase: 89 U/L (ref 39–117)
Bilirubin, Direct: 0.1 mg/dL (ref 0.0–0.3)
Total Bilirubin: 0.4 mg/dL (ref 0.2–1.2)
Total Protein: 6.8 g/dL (ref 6.0–8.3)

## 2018-07-25 ENCOUNTER — Other Ambulatory Visit: Payer: Self-pay | Admitting: Internal Medicine

## 2018-07-25 NOTE — Telephone Encounter (Signed)
Refilled: 01/17/2018 Last OV: 05/02/2018 Next OV: 08/03/2018

## 2018-08-03 ENCOUNTER — Ambulatory Visit: Payer: BC Managed Care – PPO | Admitting: Internal Medicine

## 2018-08-03 ENCOUNTER — Encounter: Payer: Self-pay | Admitting: Internal Medicine

## 2018-08-03 VITALS — BP 100/66 | HR 73 | Temp 97.8°F | Resp 15 | Ht 62.0 in | Wt 176.4 lb

## 2018-08-03 DIAGNOSIS — K760 Fatty (change of) liver, not elsewhere classified: Secondary | ICD-10-CM

## 2018-08-03 DIAGNOSIS — R748 Abnormal levels of other serum enzymes: Secondary | ICD-10-CM

## 2018-08-03 DIAGNOSIS — F32A Depression, unspecified: Secondary | ICD-10-CM

## 2018-08-03 DIAGNOSIS — F329 Major depressive disorder, single episode, unspecified: Secondary | ICD-10-CM

## 2018-08-03 MED ORDER — ZOSTER VAC RECOMB ADJUVANTED 50 MCG/0.5ML IM SUSR: 0.5000 mL | Freq: Once | INTRAMUSCULAR | 1 refills | Status: AC

## 2018-08-03 NOTE — Patient Instructions (Addendum)
Return for Non fasting liver enzymes and hepatitis antiboy screen  Before Nov 29  Ultrasound  to be ordered   To evaluate your  liver texture  Limit  Tylenol to 1000 mg as needed (500 mg if daily )\

## 2018-08-03 NOTE — Progress Notes (Signed)
Subjective:  Patient ID: Selena [name], female    DOB: [birth year]  Age: 52 y.o. MRN: [id number]  CC: The primary encounter diagnosis was Elevated liver enzymes. Diagnoses of Fatty liver and Chronic depressive disorder were also pertinent to this visit.  HPI Particia [name] presents for follow up on elevated liver enzymes noted at last visit   Repeat enzymes last week were slightly higher than previously.  She reports that her tylelnol use is not more than 1000 mg twice weekly and she does not drink alcohol.  Denies NSAID use givne history of bariatric surgery.  Still losing weight. .  Denies nausea.   She feels generally well.  Her mood has improved since starting wellbutrin  And she has improved energy level. She is grieving the loss of her father who had been estranged from her and her sister by their step mother, but they decided to visit Selena [name] and be with him during his death on [facility]osp[facility].      Outpatient Medications Prior to Visit  Medication Sig Dispense Refill  . buPROPion (WELLBUTRIN) 100 MG tablet Take 2 tablets (200 mg total) by mouth 2 (two) times daily. 120 tablet 2  . calcium gluconate 500 MG tablet Take 1 tablet by mouth 2 (two) times daily.     . clonazePAM (KLONOPIN) 1 MG tablet TAKE 1 TABLET BY MOUTH AT BEDTIME AS NEEDED FOR SLEEP 30 tablet 0  . diazepam (VALIUM) 10 MG tablet TAKE 1 TABLET BY MOUTH EVERY DAY AS NEEDED FOR ANXIETY AND MUSCLE SPASMS (Patient taking differently: Take 10 mg by mouth daily as needed for anxiety (muscle spasms). ) 15 tablet 3  . docusate sodium (COLACE) 100 MG capsule Take 400 mg by mouth daily.    . ergocalciferol (DRISDOL) 50000 units capsule Take 1 capsule (50,000 Units total) by mouth once a week. 12 capsule 3  . FLUoxetine (PROZAC) 20 MG tablet Take 3 tablets (60 mg total) by mouth daily. 90 tablet 5  . levonorgestrel (MIRENA) 20 MCG/24HR IUD 1 Intra Uterine Device (1 each total) by Intrauterine route once. 1 each 0  . Multiple  Vitamins-Iron (MULTI-VITAMIN/IRON PO) Take 1 tablet by mouth daily.     Marland Kitchen omeprazole (PRILOSEC OTC) 20 MG tablet Take 20 mg by mouth daily before breakfast.     . pramipexole (MIRAPEX) 0.125 MG tablet TAKE 1 TABLET BY MOUTH DAILY AT BEDTIME. INCREASE WEEKLY AS NEEDED (Patient taking differently: Take 0.125 mg by mouth at bedtime. ) 90 tablet 1  . rizatriptan (MAXALT) 10 MG tablet TAKE 1 TABLET (10 MG TOTAL) BY MOUTH AS NEEDED FOR MIGRAINE. MAY REPEAT IN 2 HOURS IF NEEDED (Patient taking differently: Take 10 mg by mouth as needed. TAKE 1 TABLET (10 MG TOTAL) BY MOUTH AS NEEDED FOR MIGRAINE. MAY REPEAT IN 2 HOURS IF NEEDED) 10 tablet 2  . topiramate (TOPAMAX) 100 MG tablet TAKE 1 TABLET (100 MG TOTAL) BY MOUTH DAILY. (Patient taking differently: Take 100 mg by mouth at bedtime. ) 90 tablet 1  . AMBULATORY NON FORMULARY MEDICATION Nitroglycerine ointment 0.125 %  Apply a pea sized amount internally three times daily. Dispense 30 GM zero refill (Patient not taking: Reported on 08/03/2018) 30 g 1  . bisacodyl (DULCOLAX) 5 MG EC tablet Take 5 mg by mouth daily as needed for moderate constipation.    . dibucaine (NUPERCAINAL) 1 % OINT Place 1 application rectally as needed for hemorrhoids. (Patient not taking: Reported on 08/03/2018) 28 g 0  . doxycycline (VIBRA-TABS) 100 MG  tablet Take 1 tablet (100 mg total) by mouth 2 (two) times daily. 1 po bid (Patient not taking: Reported on 08/03/2018) 20 tablet 0  . fluticasone (FLONASE) 50 MCG/ACT nasal spray Place 2 sprays into both nostrils daily. (Patient not taking: Reported on 06/16/2018) 16 g 2  . HYDROcodone-acetaminophen (NORCO/VICODIN) 5-325 MG tablet Take 1 tablet by mouth every 6 (six) hours as needed for moderate pain. (Patient not taking: Reported on 08/03/2018) 20 tablet 0  . hydrocortisone-pramoxine (PROCTOFOAM HC) rectal foam Place 1 applicator rectally 2 (two) times daily. (Patient not taking: Reported on 06/16/2018) 10 g 0  . phenylephrine-shark liver  oil-mineral oil-petrolatum (PREPARATION H) 0.25-3-14-71.9 % rectal ointment Place 1 application rectally 2 (two) times daily as needed for hemorrhoids.     No facility-administered medications prior to visit.     Review of Systems;  Patient denies headache, fevers, malaise, unintentional weight loss, skin rash, eye pain, sinus congestion and sinus pain, sore throat, dysphagia,  hemoptysis , cough, dyspnea, wheezing, chest pain, palpitations, orthopnea, edema, abdominal pain, nausea, melena, diarrhea, constipation, flank pain, dysuria, hematuria, urinary  Frequency, nocturia, numbness, tingling, seizures,  Focal weakness, Loss of consciousness,  Tremor, insomnia, depression, anxiety, and suicidal ideation.      Objective:  BP 100/66 (BP Location: Left Arm, Patient Position: Sitting, Cuff Size: Normal)   Pulse 73   Temp 97.8 F (36.6 C) (Oral)   Resp 15   Ht 5\' 2"  (1.575 m)   Wt 176 lb 6.4 oz (80 kg)   SpO2 98%   BMI 32.26 kg/m   BP Readings from Last 3 Encounters:  08/03/18 100/66  06/24/18 107/72  06/16/18 114/76    Wt Readings from Last 3 Encounters:  08/03/18 176 lb 6.4 oz (80 kg)  06/24/18 183 lb 12.8 oz (83.4 kg)  06/16/18 183 lb 12.8 oz (83.4 kg)    General appearance: alert, cooperative and appears stated age Ears: normal TM's and external ear canals both ears Throat: lips, mucosa, and tongue normal; teeth and gums normal Neck: no adenopathy, no carotid bruit, supple, symmetrical, trachea midline and thyroid not enlarged, symmetric, no tenderness/mass/nodules Back: symmetric, no curvature. ROM normal. No CVA tenderness. Lungs: clear to auscultation bilaterally Heart: regular rate and rhythm, S1, S2 normal, no murmur, click, rub or gallop Abdomen: soft, non-tender; bowel sounds normal; no masses,  no organomegaly Pulses: 2+ and symmetric Skin: Skin color, texture, turgor normal. No rashes or lesions Lymph nodes: Cervical, supraclavicular, and axillary nodes  normal.  Lab Results  Component Value Date   HGBA1C 5.7 05/06/2018   HGBA1C 5.8 05/17/2017   HGBA1C 5.5 05/21/2016    Lab Results  Component Value Date   CREATININE 0.79 06/16/2018   CREATININE 0.89 05/06/2018   CREATININE 0.90 01/24/2018    Lab Results  Component Value Date   WBC 6.9 06/16/2018   HGB 13.0 06/16/2018   HCT 37.7 06/16/2018   PLT 316 06/16/2018   GLUCOSE 94 06/16/2018   CHOL 170 05/06/2018   TRIG 88.0 05/06/2018   HDL 39.40 05/06/2018   LDLDIRECT 171 (H) 05/21/2016   LDLCALC 113 (H) 05/06/2018   ALT 79 (H) 07/18/2018   AST 41 (H) 07/18/2018   NA 140 06/16/2018   K 3.3 (L) 06/16/2018   CL 111 06/16/2018   CREATININE 0.79 06/16/2018   BUN 20 06/16/2018   CO2 23 06/16/2018   TSH 1.18 09/20/2017   INR 1.0 07/13/2008   HGBA1C 5.7 05/06/2018   MICROALBUR 0.50 06/17/2012  No results found.  Assessment & Plan:   Problem List Items Addressed This Visit    Chronic depressive disorder    Improved symptomatology with addition of wellbutrin to prozac .   No changes today      Fatty liver    Presumed by prior ultrasound,  Screening labs and ultrasound ordered       Other Visit Diagnoses    Elevated liver enzymes    -  Primary   Relevant Orders   US Abdomen Limited RUQ   Hepatic function panel   Hepatitis C antibody   Hepatitis B Core Antibody, total   Hepatitis A Ab, Total   ANA   Anti-Smith antibody   Ferritin   Iron and TIBC   Mitochondrial antibodies     A total of 25 minutes of face to face time was spent with patient more than half of which was spent in counselling about the above mentioned conditions  and coordination of care    I have discontinued Dyani Champine's fluticasone, hydrocortisone-pramoxine, dibucaine, AMBULATORY NON FORMULARY MEDICATION, bisacodyl, phenylephrine-shark liver oil-mineral oil-petrolatum, HYDROcodone-acetaminophen, and doxycycline. I am also having her start on Zoster Vaccine Adjuvanted. Additionally, I am  having her maintain her levonorgestrel, omeprazole, rizatriptan, diazepam, pramipexole, calcium gluconate, Multiple Vitamins-Iron (MULTI-VITAMIN/IRON PO), ergocalciferol, topiramate, docusate sodium, FLUoxetine, buPROPion, and clonazePAM.  Meds ordered this encounter  Medications  . Zoster Vaccine Adjuvanted [facility]) injection    Sig: Inject 0.5 mLs into the muscle once for 1 dose.    Dispense:  1 each    Refill:  1    Medications Discontinued During This Encounter  Medication Reason  . AMBULATORY NON FORMULARY MEDICATION   . bisacodyl (DULCOLAX) 5 MG EC tablet Error  . dibucaine (NUPERCAINAL) 1 % OINT Error  . doxycycline (VIBRA-TABS) 100 MG tablet Error  . fluticasone (FLONASE) 50 MCG/ACT nasal spray Error  . HYDROcodone-acetaminophen (NORCO/VICODIN) 5-325 MG tablet Error  . hydrocortisone-pramoxine (PROCTOFOAM HC) rectal foam Error  . phenylephrine-shark liver oil-mineral oil-petrolatum (PREPARATION H) 0.25-3-14-71.9 % rectal ointment Error    Follow-up: No follow-ups on file.   [name], MD

## 2018-08-06 ENCOUNTER — Other Ambulatory Visit: Payer: Self-pay | Admitting: Internal Medicine

## 2018-08-06 NOTE — Assessment & Plan Note (Signed)
Presumed by prior ultrasound,  Screening labs and ultrasound ordered

## 2018-08-06 NOTE — Assessment & Plan Note (Addendum)
Improved symptomatology with addition of wellbutrin to prozac .   No changes today

## 2018-08-11 ENCOUNTER — Other Ambulatory Visit: Payer: Self-pay | Admitting: Internal Medicine

## 2018-08-11 DIAGNOSIS — G43009 Migraine without aura, not intractable, without status migrainosus: Secondary | ICD-10-CM

## 2018-08-11 NOTE — Telephone Encounter (Signed)
Last OV 08/03/2018   Last refilled 02/07/2018 disp 10 with 2 refills   Sent to PCP

## 2018-08-12 ENCOUNTER — Ambulatory Visit
Admission: RE | Admit: 2018-08-12 | Discharge: 2018-08-12 | Disposition: A | Discharge status: Home / Self Care | Payer: BC Managed Care – PPO | Source: Ambulatory Visit | Attending: Internal Medicine | Admitting: Internal Medicine

## 2018-08-12 ENCOUNTER — Telehealth: Payer: Self-pay

## 2018-08-12 DIAGNOSIS — Z9049 Acquired absence of other specified parts of digestive tract: Secondary | ICD-10-CM | POA: Insufficient documentation

## 2018-08-12 DIAGNOSIS — R748 Abnormal levels of other serum enzymes: Secondary | ICD-10-CM | POA: Insufficient documentation

## 2018-08-12 NOTE — Telephone Encounter (Signed)
Requesting ultrasound results. Pt has mychart.

## 2018-08-12 NOTE — Telephone Encounter (Signed)
Copied from [place] [id number]. Topic: Quick Communication - See Telephone Encounter >> Aug 12, 2018  4:18 PM Percell Belt A wrote: CRM for notification. See Telephone encounter for: 08/12/18.   Pt called in and would like to know if there was any way she could get her results of her liver ultrasound today?  She was a little worried and didn't want to wait though the weekend  **Please advise?**

## 2018-08-26 ENCOUNTER — Other Ambulatory Visit: Payer: Self-pay | Admitting: Internal Medicine

## 2018-08-31 ENCOUNTER — Other Ambulatory Visit: Payer: BC Managed Care – PPO

## 2018-08-31 DIAGNOSIS — R748 Abnormal levels of other serum enzymes: Secondary | ICD-10-CM

## 2018-08-31 NOTE — Addendum Note (Signed)
Addended by: Leeanne Rio on: 08/31/2018 02:08 PM   Modules accepted: Orders

## 2018-09-04 ENCOUNTER — Other Ambulatory Visit: Payer: Self-pay | Admitting: Internal Medicine

## 2018-09-04 DIAGNOSIS — R748 Abnormal levels of other serum enzymes: Secondary | ICD-10-CM

## 2018-09-05 ENCOUNTER — Telehealth: Payer: Self-pay | Admitting: Internal Medicine

## 2018-09-05 DIAGNOSIS — R7401 Elevation of levels of liver transaminase levels: Secondary | ICD-10-CM

## 2018-09-05 DIAGNOSIS — R740 Nonspecific elevation of levels of transaminase and lactic acid dehydrogenase [LDH]: Principal | ICD-10-CM

## 2018-09-05 NOTE — Telephone Encounter (Signed)
Ok,  I now understand!   .I did not realize that you are already established with [facility]  From prior procedures.m not for elevated liver enzymes.    Do not change gastroenterologists.  I will make a new referral to [facility] for the evaluation of the liver issue.

## 2018-09-05 NOTE — Telephone Encounter (Signed)
If you  already have an appointment with Dr Allen Norris in January, she does not need a second gastroenterologist . I was not aware that you were  already set up with  Dr Allen Norris when I made the new referral .   He is excellent and specializes in fatty liver and other liver issues    Regards,   Deborra Medina, MD

## 2018-09-05 NOTE — Telephone Encounter (Signed)
Copied from [place] [id number]. Topic: General - Other >> Sep 05, 2018  3:36 PM Selena [name], NT wrote: Reason for CRM: Patient called and would like to know if she is supposed to change Selena [name] , she has an upcoming appointment with Dr Allen Norris  in January , The scheduling coordinator was a little upset about the patient having 2 gastro drs ,The  patient  would like to know which gastro Dr Derrel Nip wants her see , she says she can answer  thru my chart .

## 2018-09-07 LAB — HEPATITIS C ANTIBODY
Hepatitis C Ab: NONREACTIVE
SIGNAL TO CUT-OFF: 0.01 (ref <1.00)

## 2018-09-07 LAB — IRON,TIBC AND FERRITIN PANEL
%SAT: 34 % (calc) (ref 16–45)
Ferritin: 96 ng/mL (ref 16–232)
Iron: 96 ug/dL (ref 45–160)
TIBC: 283 mcg/dL (calc) (ref 250–450)

## 2018-09-07 LAB — HEPATIC FUNCTION PANEL
AG Ratio: 1.8 (calc) (ref 1.0–2.5)
ALT: 88 U/L — ABNORMAL HIGH (ref 6–29)
AST: 58 U/L — ABNORMAL HIGH (ref 10–35)
Albumin: 3.9 g/dL (ref 3.6–5.1)
Alkaline phosphatase (APISO): 77 U/L (ref 33–130)
Bilirubin, Direct: 0.1 mg/dL (ref 0.0–0.2)
Globulin: 2.2 g/dL (calc) (ref 1.9–3.7)
Indirect Bilirubin: 0.2 mg/dL (calc) (ref 0.2–1.2)
Total Bilirubin: 0.3 mg/dL (ref 0.2–1.2)
Total Protein: 6.1 g/dL (ref 6.1–8.1)

## 2018-09-07 LAB — HEPATITIS B CORE ANTIBODY, TOTAL: Hep B Core Total Ab: NONREACTIVE

## 2018-09-07 LAB — MITOCHONDRIAL ANTIBODIES: Mitochondrial M2 Ab, IgG: <=20 U

## 2018-09-07 LAB — ANTI-SMITH ANTIBODY: ENA SM Ab Ser-aCnc: <1 AI

## 2018-09-07 LAB — ANTI-NUCLEAR AB-TITER (ANA TITER): ANA Titer 1: 1:40 {titer} — ABNORMAL HIGH

## 2018-09-07 LAB — HEPATITIS A ANTIBODY, TOTAL: Hepatitis A AB,Total: NONREACTIVE

## 2018-09-07 LAB — ANA: Anti Nuclear Antibody(ANA): POSITIVE — AB

## 2018-09-08 ENCOUNTER — Other Ambulatory Visit: Payer: Self-pay | Admitting: Internal Medicine

## 2018-09-08 MED ORDER — TRAMADOL HCL 50 MG PO TABS: 50.0000 mg | ORAL_TABLET | Freq: Four times a day (QID) | ORAL | 0 refills | Status: DC | PRN

## 2018-09-16 ENCOUNTER — Telehealth: Payer: Self-pay | Admitting: Gastroenterology

## 2018-09-16 NOTE — Telephone Encounter (Signed)
Pt wants to discuss some concerns about her lab results that she got back form primary doctor before her schedule visit 10/06/2018 with dr. Loletha Carrow.

## 2018-09-16 NOTE — Telephone Encounter (Signed)
Spoke with pt and she is concerned about some lab results, LFT's have increased. PCP mentioned to her she may need a liver bx and she is very anxious about having this done. Pts appt moved to 09/20/18@4pm  to a sooner appt, pt aware and very happy to be seen sooner.

## 2018-09-20 ENCOUNTER — Encounter: Payer: Self-pay | Admitting: Gastroenterology

## 2018-09-20 ENCOUNTER — Ambulatory Visit (INDEPENDENT_AMBULATORY_CARE_PROVIDER_SITE_OTHER): Payer: BC Managed Care – PPO | Admitting: Gastroenterology

## 2018-09-20 ENCOUNTER — Encounter

## 2018-09-20 ENCOUNTER — Other Ambulatory Visit (INDEPENDENT_AMBULATORY_CARE_PROVIDER_SITE_OTHER): Payer: BC Managed Care – PPO

## 2018-09-20 VITALS — BP 100/70 | HR 70 | Ht 62.0 in | Wt 164.0 lb

## 2018-09-20 DIAGNOSIS — R740 Nonspecific elevation of levels of transaminase and lactic acid dehydrogenase [LDH]: Secondary | ICD-10-CM

## 2018-09-20 DIAGNOSIS — R7401 Elevation of levels of liver transaminase levels: Secondary | ICD-10-CM

## 2018-09-20 LAB — HEPATIC FUNCTION PANEL
ALT: 68 U/L — ABNORMAL HIGH (ref 0–35)
AST: 31 U/L (ref 0–37)
Albumin: 4.1 g/dL (ref 3.5–5.2)
Alkaline Phosphatase: 80 U/L (ref 39–117)
Bilirubin, Direct: 0.1 mg/dL (ref 0.0–0.3)
Total Bilirubin: 0.3 mg/dL (ref 0.2–1.2)
Total Protein: 6.2 g/dL (ref 6.0–8.3)

## 2018-09-20 NOTE — Patient Instructions (Signed)
If you are age 50 or older, your body mass index should be between 23-30. Your Body mass index is 30 kg/m. If this is out of the aforementioned range listed, please consider follow up with your Primary Care Provider.  If you are age 59 or younger, your body mass index should be between 19-25. Your Body mass index is 30 kg/m. If this is out of the aformentioned range listed, please consider follow up with your Primary Care Provider.   Your provider has requested that you go to the basement level for lab work before leaving today. Press "B" on the elevator. The lab is located at the first door on the left as you exit the elevator.  It was a pleasure to see you today!  Dr. Loletha Carrow

## 2018-09-20 NOTE — Progress Notes (Signed)
[facility] Progress Note  Chief Complaint: Abnormal liver chemistries.  Subjective  History:  Selena [name] was sent by primary care for elevated transaminases that have gone up over the last 6 months.  It was first noticed in mid April on preop labs before her bariatric surgery.  At that time she had a mild elevation of ALT to 51.  She has had a mild increase in transaminases since then and primary care has been working it up.  Her only new medicine is a Biotin supplement she started earlier this year because of hair loss.  She does not have regular alcohol use and occasionally takes Tylenol. She does not take aspirin or NSAIDs because of her gastric bypass.  Since I last saw her for thrombosed external hemorrhoid and suspected anal fissure, she has had external hemorrhoidectomy and lateral sphincterotomy by Dr. Hassell Done in September with relief of those symptoms.  She lately had some feelings of swelling in the perianal area and was somewhat constipated over the last week or 2.  So I recommended she start low-dose MiraLAX today. ROS: Cardiovascular:  no chest pain Respiratory: no dyspnea Anxiety and depression, stable Insomnia Remainder of systems negative except as above The patient's Past Medical, Family and Social History were reviewed and are on file in the EMR.  Objective:  Med list reviewed  Current Outpatient Medications:  .  buPROPion (WELLBUTRIN) 100 MG tablet, Take 2 tablets (200 mg total) by mouth 2 (two) times daily., Disp: 120 tablet, Rfl: 2 .  calcium gluconate 500 MG tablet, Take 1 tablet by mouth 2 (two) times daily. , Disp: , Rfl:  .  clonazePAM (KLONOPIN) 1 MG tablet, TAKE 1 TABLET BY MOUTH EVERY DAY AT BEDTIME AS NEEDED FOR SLEEP, Disp: 30 tablet, Rfl: 5 .  diazepam (VALIUM) 10 MG tablet, TAKE 1 TABLET BY MOUTH EVERY DAY AS NEEDED FOR ANXIETY AND MUSCLE SPASMS (Patient taking differently: Take 10 mg by mouth daily as needed for anxiety (muscle spasms). ), Disp:  15 tablet, Rfl: 3 .  docusate sodium (COLACE) 100 MG capsule, Take 400 mg by mouth daily., Disp: , Rfl:  .  ergocalciferol (DRISDOL) 50000 units capsule, Take 1 capsule (50,000 Units total) by mouth once a week., Disp: 12 capsule, Rfl: 3 .  FLUoxetine (PROZAC) 20 MG tablet, TAKE 3 TABLETS BY MOUTH EVERY DAY, Disp: 270 tablet, Rfl: 2 .  levonorgestrel (MIRENA) 20 MCG/24HR IUD, 1 Intra Uterine Device (1 each total) by Intrauterine route once., Disp: 1 each, Rfl: 0 .  Multiple Vitamins-Iron (MULTI-VITAMIN/IRON PO), Take 1 tablet by mouth daily. , Disp: , Rfl:  .  omeprazole (PRILOSEC OTC) 20 MG tablet, Take 20 mg by mouth daily before breakfast. , Disp: , Rfl:  .  pramipexole (MIRAPEX) 0.125 MG tablet, TAKE 1 TABLET BY MOUTH DAILY AT BEDTIME. INCREASE WEEKLY AS NEEDED (Patient taking differently: Take 0.125 mg by mouth at bedtime. ), Disp: 90 tablet, Rfl: 1 .  rizatriptan (MAXALT) 10 MG tablet, TAKE 1 TABLET (10 MG TOTAL) BY MOUTH AS NEEDED FOR MIGRAINE. MAY REPEAT IN 2 HOURS IF NEEDED, Disp: 10 tablet, Rfl: 2 .  topiramate (TOPAMAX) 100 MG tablet, TAKE 1 TABLET (100 MG TOTAL) BY MOUTH DAILY. (Patient taking differently: Take 100 mg by mouth at bedtime. ), Disp: 90 tablet, Rfl: 1 .  traMADol (ULTRAM) 50 MG tablet, Take 1 tablet (50 mg total) by mouth every 6 (six) hours as needed., Disp: 30 tablet, Rfl: 0  Also on Biotin supplement  Vital signs in last 24 hrs: Vitals:   09/20/18 1544  BP: 100/70  Pulse: 70    Physical Exam  She is well appearing and accompanied by her mother.  HEENT: sclera anicteric, oral mucosa moist without lesions  Neck: supple, no thyromegaly, JVD or lymphadenopathy  Cardiac: RRR without murmurs, S1S2 heard, no peripheral edema  Pulm: clear to auscultation bilaterally, normal RR and effort noted  Abdomen: soft, no tenderness, with active bowel sounds. No guarding or palpable hepatosplenomegaly.  Skin; warm and dry, no jaundice or rash  Recent Labs:  CMP  Latest Ref Rng & Units 09/20/2018 08/31/2018 07/18/2018  Glucose 70 - 99 mg/dL - - -  BUN 6 - 20 mg/dL - - -  Creatinine 0.44 - 1.00 mg/dL - - -  Sodium 135 - 145 mmol/L - - -  Potassium 3.5 - 5.1 mmol/L - - -  Chloride 98 - 111 mmol/L - - -  CO2 22 - 32 mmol/L - - -  Calcium 8.9 - 10.3 mg/dL - - -  Total Protein 6.0 - 8.3 g/dL 6.2 6.1 6.8  Total Bilirubin 0.2 - 1.2 mg/dL 0.3 0.3 0.4  Alkaline Phos 39 - 117 U/L 80 - 89  AST 0 - 37 U/L 31 58(H) 41(H)  ALT 0 - 35 U/L 68(H) 88(H) 79(H)  LFTs from April 2019 as noted above and on file  HCV Ab neg HBV Core Ab neg No HBV Sag  HAV total Ab neg  Normal iron studies 08/31/18  ANA 1:40  Radiologic studies: 08/12/18  [place] normal, s/p chole   @ASSESSMENTPLANBEGIN @ Assessment: Encounter Diagnosis  Name Primary?  . Transaminitis Yes   Nonspecific mild ALT predominant transaminitis.  I do not think she has autoimmune hepatitis, iron studies were negative, viral hepatitis serologies negative.  She is on a supplement with which I am not familiar, so I do not know how commonly it might be implicated in elevated LFTs.  None of her other medications were new or apparent triggers for this.  No fatty liver was seen on [place]   Plan: Reassured her. LFTs today Hep B Surface antigen to be complete Stop Biotin for the time being in case it is related.   Total time 25 minutes, over half spent face-to-face with patient in counseling and coordination of care.   Nelida Meuse III

## 2018-09-21 LAB — HEPATITIS B SURFACE ANTIGEN: Hepatitis B Surface Ag: NONREACTIVE

## 2018-09-22 ENCOUNTER — Other Ambulatory Visit: Payer: Self-pay

## 2018-09-22 DIAGNOSIS — R7989 Other specified abnormal findings of blood chemistry: Secondary | ICD-10-CM

## 2018-09-22 DIAGNOSIS — R945 Abnormal results of liver function studies: Principal | ICD-10-CM

## 2018-09-29 ENCOUNTER — Other Ambulatory Visit: Payer: Self-pay | Admitting: Internal Medicine

## 2018-10-06 ENCOUNTER — Ambulatory Visit: Payer: Self-pay | Admitting: Gastroenterology

## 2018-10-09 ENCOUNTER — Other Ambulatory Visit: Payer: Self-pay | Admitting: Internal Medicine

## 2018-10-13 ENCOUNTER — Encounter

## 2018-10-13 ENCOUNTER — Ambulatory Visit: Payer: Self-pay | Admitting: Gastroenterology

## 2018-10-19 ENCOUNTER — Other Ambulatory Visit: Payer: Self-pay | Admitting: Internal Medicine

## 2018-10-19 MED ORDER — BUTALBITAL-APAP-CAFFEINE 50-325-40 MG PO TABS: 1.0000 | ORAL_TABLET | Freq: Four times a day (QID) | ORAL | 0 refills | Status: AC | PRN

## 2018-10-19 NOTE — Progress Notes (Signed)
cet

## 2018-10-27 ENCOUNTER — Ambulatory Visit: Payer: BC Managed Care – PPO | Admitting: Family Medicine

## 2018-10-27 ENCOUNTER — Encounter: Payer: Self-pay | Admitting: Family Medicine

## 2018-10-27 DIAGNOSIS — G43019 Migraine without aura, intractable, without status migrainosus: Secondary | ICD-10-CM

## 2018-10-27 MED ORDER — KETOROLAC TROMETHAMINE 60 MG/2ML IM SOLN
60.0000 mg | Freq: Once | INTRAMUSCULAR | Status: AC
  Administered 2018-10-27: 60 mg via INTRAMUSCULAR

## 2018-10-27 MED ORDER — RIZATRIPTAN BENZOATE 10 MG PO TABS: ORAL_TABLET | ORAL | 2 refills | Status: DC

## 2018-10-27 MED ORDER — PROMETHAZINE HCL 25 MG/ML IJ SOLN
25.0000 mg | Freq: Once | INTRAMUSCULAR | Status: AC
  Administered 2018-10-27: 25 mg via INTRAMUSCULAR

## 2018-10-27 NOTE — Progress Notes (Signed)
Subjective:    Patient ID: Selena [name], female    DOB: [birth year], 53 y.o.   MRN: [id number]  HPI   Patient presents to clinic complaining of a migraine headache that has been present almost every day for the past 3 weeks.  Patient states she usually will get 1 migraine per month, and her rizatriptan tends to help.  States every couple of years she will have an episode similar to this where she will have a long-term headache that just will not break with her regular medicines.  Patient also has tried Fioricet to see if this would help break headache without success.  Currently her headache is like a band around her forehead, will begin there in the mornings, and by end of day it will radiate down into the back of her head and top of neck.  Patient does take Topamax 100 mg every day for headache prevention.  Denies any loss of vision, denies any speech difficulty, denies facial droop or one-sided extremity weakness.  Patient Active Problem List   Diagnosis Date Noted  . Prolapsed external hemorrhoids 05/02/2018  . 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 Chronic depressive disorder 01/15/2018  . 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 Paroxysmal tachycardia ([place]) 10/27/2017  . Constipation by delayed colonic transit 09/21/2017  . SVT (supraventricular tachycardia) ([place]) 07/10/2017  . Allergic conjunctivitis and rhinitis 10/01/2016  . Atypical chest pain 12/22/2015  . Restless legs syndrome 04/27/2015  . Insomnia secondary to anxiety 12/22/2014  . IBS (irritable bowel syndrome) 04/21/2014  . Hyperlipidemia 04/21/2014  . Vitamin D deficiency 04/21/2014  . Preoperative evaluation to rule out surgical contraindication 04/21/2014  . Cervicalgia 07/25/2013  . Morbid obesity ([place]) 07/25/2013  . Generalized anxiety disorder 07/25/2013  . 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 Snoring disorder 03/11/2013  . Fatty liver 08/20/2011  . 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 KNEE PAIN, BILATERAL  04/01/2010  . Migraine without aura 05/30/2008  . HYPERTENSION 05/30/2008  . ASTHMA, EXERCISE INDUCED 05/30/2008  . PREMENSTRUAL DYSPHORIC SYNDROME 05/30/2008  . Fibromyalgia 05/30/2008   Social History   Tobacco Use  . Smoking status: Never Smoker  . Smokeless tobacco: Never Used  Substance Use Topics  . Alcohol use: Yes    Comment: occasional    Review of Systems  Constitutional: Negative for chills, fatigue and fever.  HENT: Negative for congestion, ear pain, sinus pain and sore throat.   Eyes: Negative.   Respiratory: Negative for cough, shortness of breath and wheezing.   Cardiovascular: Negative for chest pain, palpitations and leg swelling.  Gastrointestinal: Negative for abdominal pain, diarrhea, nausea and vomiting.  Genitourinary: Negative for dysuria, frequency and urgency.  Musculoskeletal: Negative for arthralgias and myalgias.  Skin: Negative for color change, pallor and rash.  Neurological: Negative for syncope, light-headedness. +migraine daily for 3 weeks.   Psychiatric/Behavioral: The patient is not nervous/anxious.       Objective:   Physical Exam Vitals signs and nursing note reviewed.  Constitutional:      General: She is not in acute distress.    Appearance: She is well-developed. She is not ill-appearing, toxic-appearing or diaphoretic.  HENT:     Head: Normocephalic and atraumatic.     Mouth/Throat:     Mouth: Mucous membranes are moist.  Eyes:     General: No visual field deficit. Neck:     Musculoskeletal: Normal range of motion and neck supple. No neck rigidity.  Cardiovascular:     Rate and Rhythm: Normal rate and  regular rhythm.  Pulmonary:     Effort: Pulmonary effort is normal. No respiratory distress.     Breath sounds: Normal breath sounds.  Lymphadenopathy:     Cervical: No cervical adenopathy.  Skin:    General: Skin is warm and dry.     Coloration: Skin is not pale.  Neurological:     Mental Status: She is alert and oriented  to person, place, and time.     Cranial Nerves: No cranial nerve deficit or facial asymmetry.     Motor: No weakness.     Coordination: Coordination normal.     Gait: Gait normal.     Comments: Smile symmetrical. Speech clear. Grips equal & strong. Can raise eyebrows, puff out cheeks, clench teeth without issue.   Psychiatric:        Mood and Affect: Mood normal.        Behavior: Behavior normal.    Vitals:   10/27/18 1610  BP: 112/84  Pulse: 79  Resp: 18  Temp: 98.2 F (36.8 C)  SpO2: 98%      Assessment & Plan:   Intractable migraine - patient will get IM Toradol 60 mg and IM Phenergan 25 mg x 1.  This combination usually works well to break her headache cycle.  Patient will continue using her Topamax daily, if she begins to have headaches more frequently we discussed possibility of increasing Topamax dose to 125 mg Or 150 mg/day, max Topamax dose per day is 200 mg.  Patient also given refill of her Rizatriptan to use as needed for migraine breakthrough.  If patient continues to have difficult migraines that are not being controlled with current medications, we can consider referral to neurology for further medication management.  Administrations This Visit    ketorolac (TORADOL) injection 60 mg    Admin Date 10/27/2018 Action Given Dose 60 mg Route Intramuscular Administered By Neta Ehlers, RMA       promethazine (PHENERGAN) injection 25 mg    Admin Date 10/27/2018 Action Given Dose 25 mg Route Intramuscular Administered By Neta Ehlers, RMA         Patient will follow-up as regularly planned with PCP.  She will make [place] aware if migraine headaches persist.

## 2018-10-27 NOTE — Patient Instructions (Signed)

## 2018-11-03 ENCOUNTER — Ambulatory Visit: Payer: Self-pay | Admitting: Internal Medicine

## 2018-11-20 ENCOUNTER — Other Ambulatory Visit: Payer: Self-pay | Admitting: Internal Medicine

## 2018-11-24 ENCOUNTER — Other Ambulatory Visit: Payer: Self-pay | Admitting: Internal Medicine

## 2018-12-12 ENCOUNTER — Other Ambulatory Visit: Payer: Self-pay | Admitting: Internal Medicine

## 2019-01-26 ENCOUNTER — Other Ambulatory Visit: Payer: Self-pay | Admitting: Family Medicine

## 2019-01-26 DIAGNOSIS — G43019 Migraine without aura, intractable, without status migrainosus: Secondary | ICD-10-CM

## 2019-03-08 ENCOUNTER — Other Ambulatory Visit: Payer: Self-pay | Admitting: Internal Medicine

## 2019-03-08 NOTE — Telephone Encounter (Signed)
Refilled: 08/26/2018 Last OV: 08/03/2018 Next OV: not scheduled

## 2019-03-20 ENCOUNTER — Other Ambulatory Visit: Payer: Self-pay | Admitting: Internal Medicine

## 2019-03-27 ENCOUNTER — Other Ambulatory Visit: Payer: Self-pay | Admitting: Internal Medicine

## 2019-03-27 IMAGING — US US ABDOMEN LIMITED
1 series · 14 of 25 positions shown · non-contrast
Comparison: February 17, 2013

CLINICAL DATA: Elevated liver enzymes.

EXAM:
ULTRASOUND ABDOMEN LIMITED RIGHT UPPER QUADRANT

[Series 1: us abdomen limited · 14 of 34 slices shown]
[im 1/34]
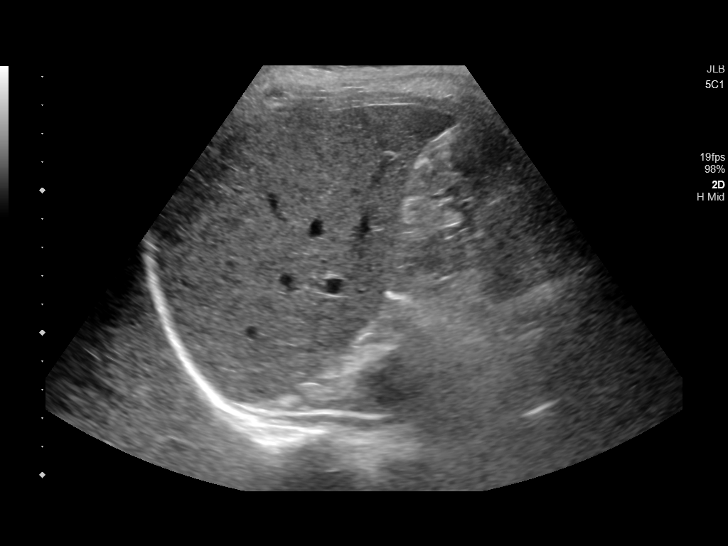
[im 3/34]
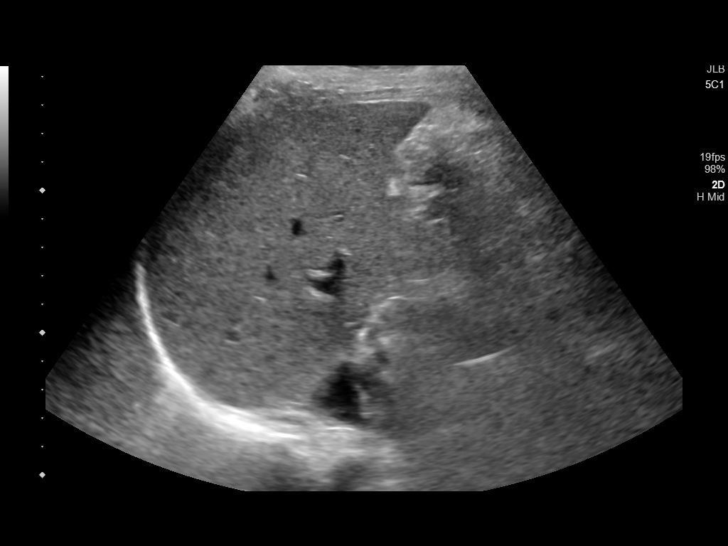
[im 6/34]
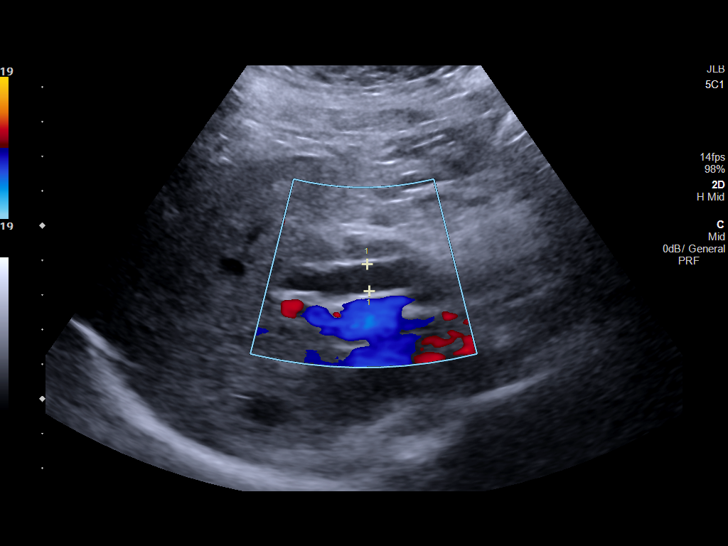
[im 9/34]
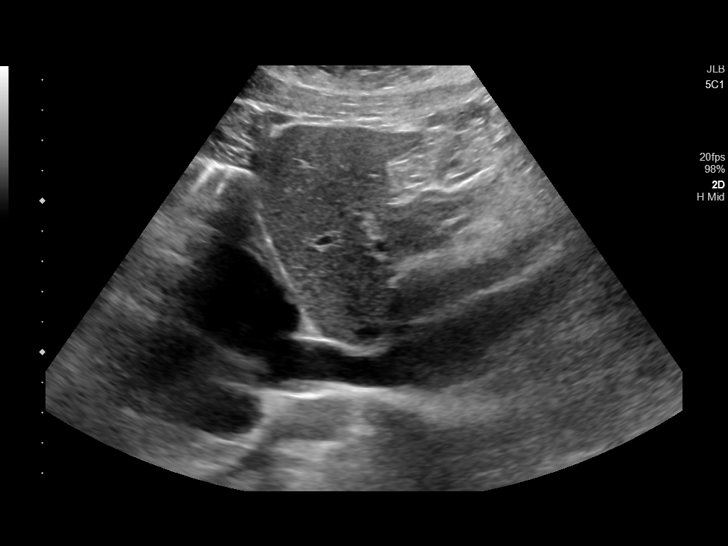
[im 12/34]
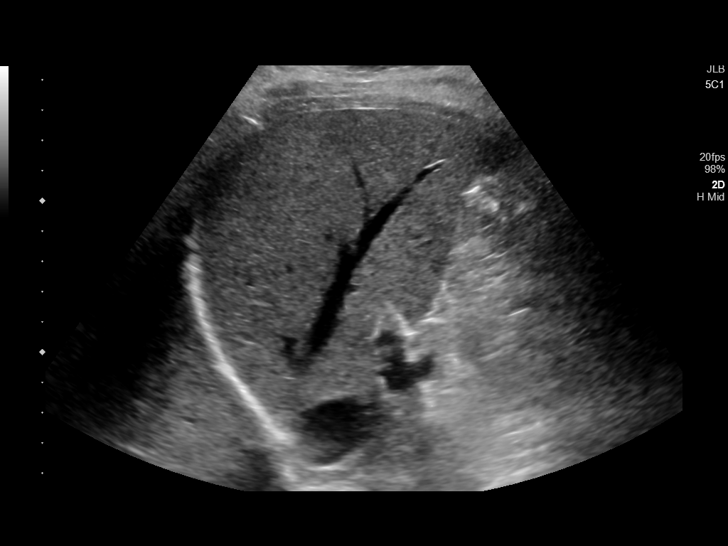
[im 13/34]
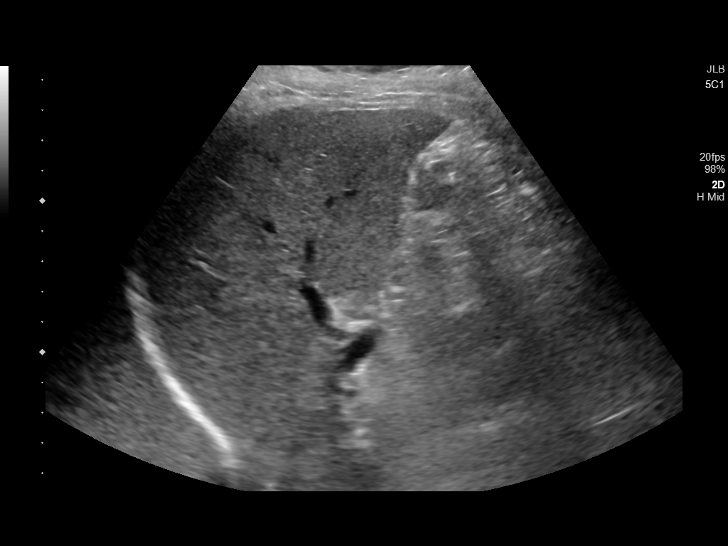
[im 16/34]
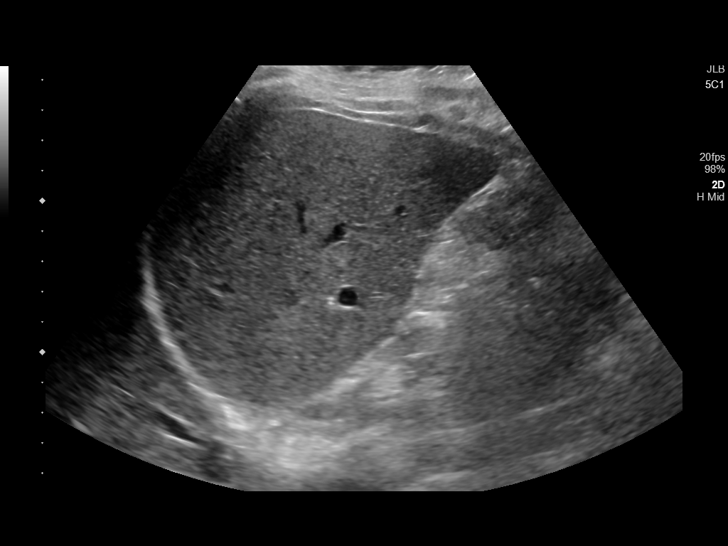
[im 18/34]
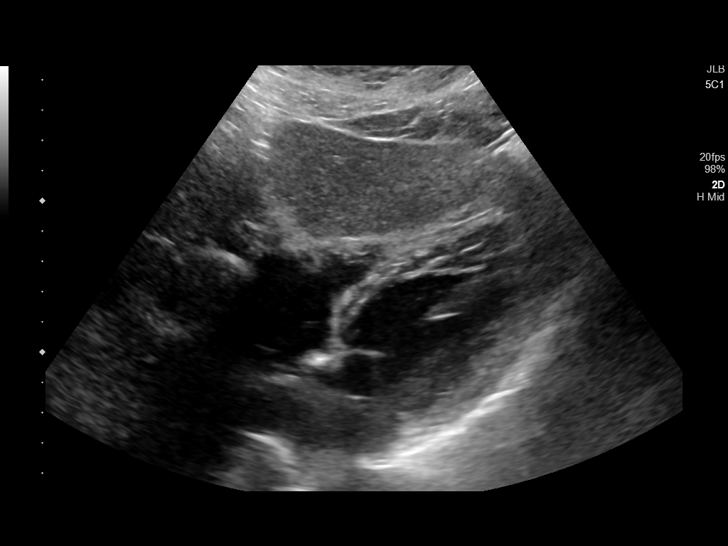
[im 21/34]
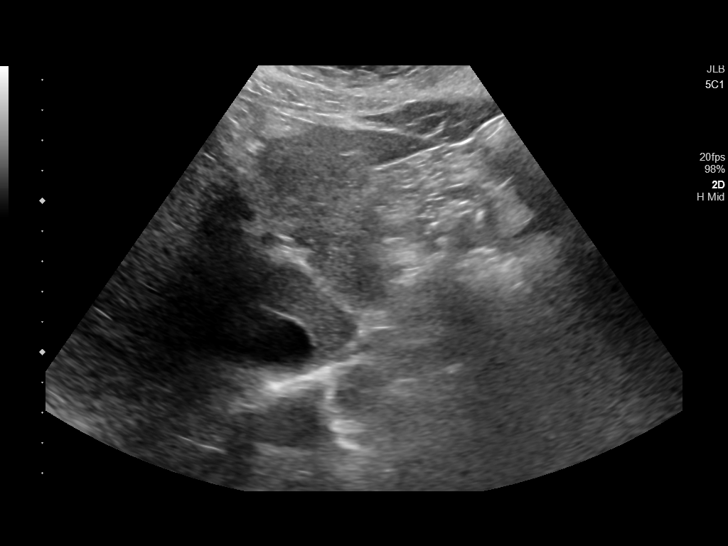
[im 23/34]
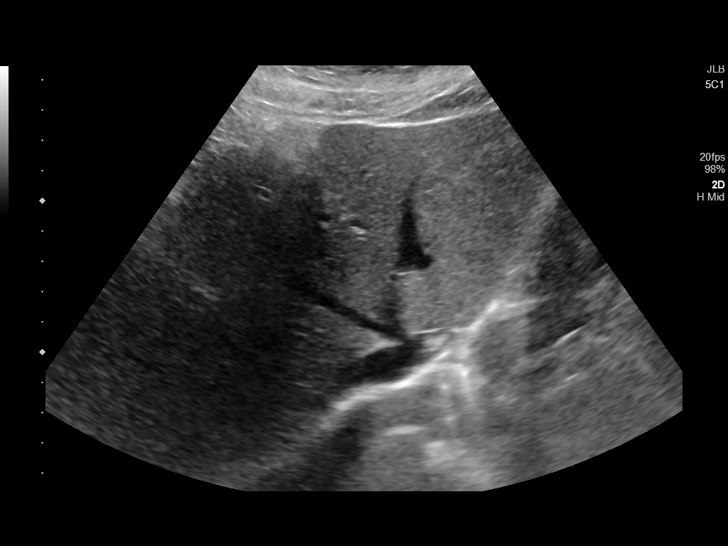
[im 25/34]
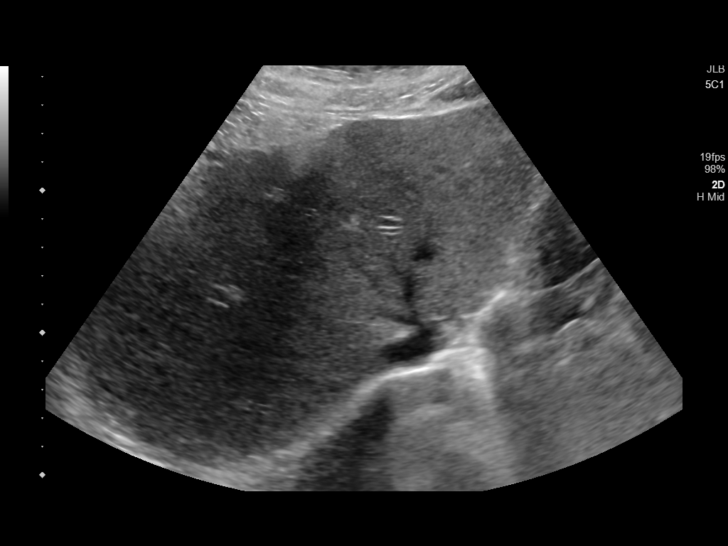
[im 28/34]
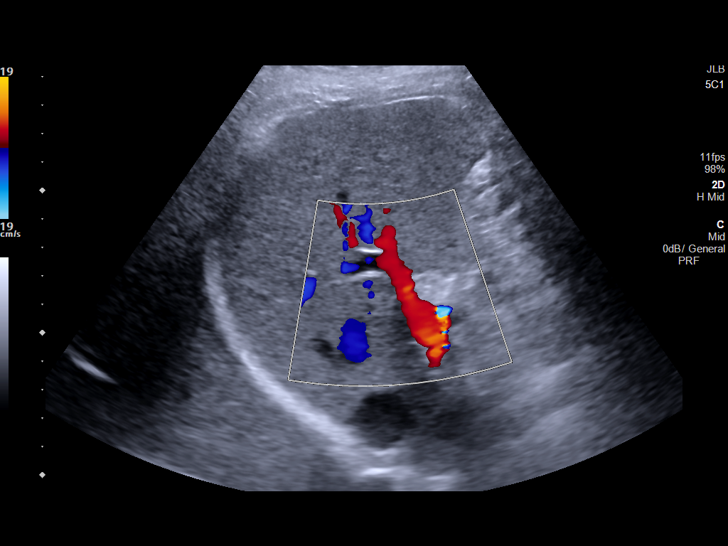
[im 31/34]
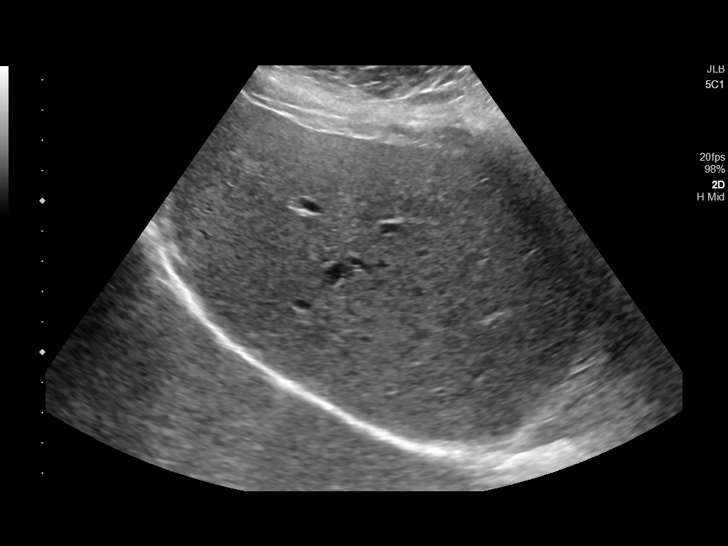
[im 34/34]
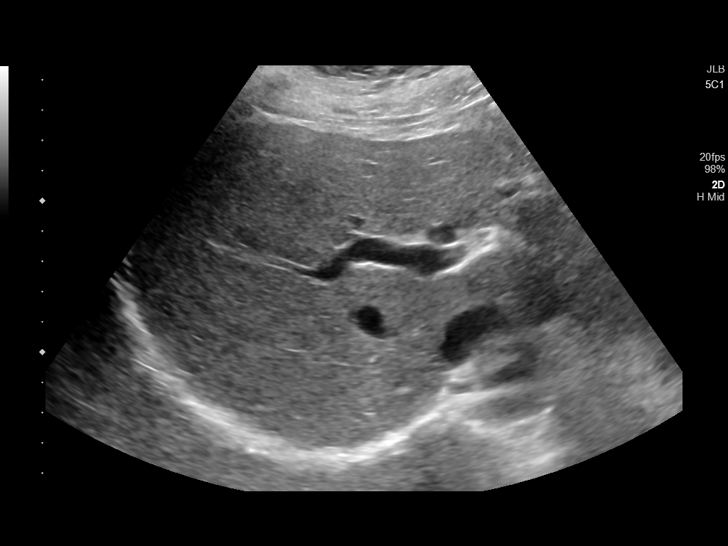

[14 of 25 positions shown; findings below may reference images not displayed]

FINDINGS: Gallbladder:

Surgically absent.

Common bile duct:

Diameter: 7.8 mm

Liver:

No focal lesion identified. Within normal limits in parenchymal
echogenicity. Portal vein is patent on color Doppler imaging with
normal direction of blood flow towards the liver.
IMPRESSION: Prior cholecystectomy. Liver is normal. No acute abnormality
identified.

## 2019-04-01 ENCOUNTER — Other Ambulatory Visit: Payer: Self-pay | Admitting: Internal Medicine

## 2019-04-01 DIAGNOSIS — K760 Fatty (change of) liver, not elsewhere classified: Secondary | ICD-10-CM

## 2019-04-05 ENCOUNTER — Other Ambulatory Visit (INDEPENDENT_AMBULATORY_CARE_PROVIDER_SITE_OTHER): Payer: BC Managed Care – PPO

## 2019-04-05 ENCOUNTER — Other Ambulatory Visit: Payer: Self-pay

## 2019-04-05 ENCOUNTER — Telehealth: Payer: Self-pay | Admitting: *Deleted

## 2019-04-05 DIAGNOSIS — E559 Vitamin D deficiency, unspecified: Secondary | ICD-10-CM

## 2019-04-05 DIAGNOSIS — K760 Fatty (change of) liver, not elsewhere classified: Secondary | ICD-10-CM | POA: Diagnosis not present

## 2019-04-05 LAB — COMPREHENSIVE METABOLIC PANEL
ALT: 66 U/L — ABNORMAL HIGH (ref 0–35)
AST: 39 U/L — ABNORMAL HIGH (ref 0–37)
Albumin: 4.2 g/dL (ref 3.5–5.2)
Alkaline Phosphatase: 70 U/L (ref 39–117)
BUN: 13 mg/dL (ref 6–23)
CO2: 23 mEq/L (ref 19–32)
Calcium: 8.9 mg/dL (ref 8.4–10.5)
Chloride: 108 mEq/L (ref 96–112)
Creatinine, Ser: 0.86 mg/dL (ref 0.40–1.20)
GFR: 69.57 mL/min (ref >60.00)
Glucose, Bld: 84 mg/dL (ref 70–99)
Potassium: 3.6 mEq/L (ref 3.5–5.1)
Sodium: 140 mEq/L (ref 135–145)
Total Bilirubin: 0.5 mg/dL (ref 0.2–1.2)
Total Protein: 6.5 g/dL (ref 6.0–8.3)

## 2019-04-05 LAB — LIPID PANEL
Cholesterol: 156 mg/dL (ref 0–200)
HDL: 56 mg/dL (ref >39.00)
LDL Cholesterol: 89 mg/dL (ref 0–99)
NonHDL: 100.38
Total CHOL/HDL Ratio: 3
Triglycerides: 59 mg/dL (ref 0.0–149.0)
VLDL: 11.8 mg/dL (ref 0.0–40.0)

## 2019-04-05 LAB — VITAMIN D 25 HYDROXY (VIT D DEFICIENCY, FRACTURES): VITD: 34.63 ng/mL (ref 30.00–100.00)

## 2019-04-05 NOTE — Telephone Encounter (Signed)
Pt came in for labs this morning & asked if her Vit D would be checked today. Pt informed that it was not ordered. Please place future order, if you are okay with adding it on.  Thanks

## 2019-04-05 NOTE — Addendum Note (Signed)
Addended by: Leeanne Rio on: 04/05/2019 11:15 AM   Modules accepted: Orders

## 2019-04-05 NOTE — Telephone Encounter (Signed)
Vit d added

## 2019-04-13 ENCOUNTER — Encounter: Payer: Self-pay | Admitting: Internal Medicine

## 2019-04-13 ENCOUNTER — Other Ambulatory Visit: Payer: Self-pay

## 2019-04-13 ENCOUNTER — Ambulatory Visit: Payer: BC Managed Care – PPO | Admitting: Internal Medicine

## 2019-04-13 ENCOUNTER — Ambulatory Visit (INDEPENDENT_AMBULATORY_CARE_PROVIDER_SITE_OTHER): Payer: BC Managed Care – PPO | Admitting: Internal Medicine

## 2019-04-13 DIAGNOSIS — Z9884 Bariatric surgery status: Secondary | ICD-10-CM | POA: Diagnosis not present

## 2019-04-13 DIAGNOSIS — F419 Anxiety disorder, unspecified: Secondary | ICD-10-CM | POA: Diagnosis not present

## 2019-04-13 DIAGNOSIS — F411 Generalized anxiety disorder: Secondary | ICD-10-CM

## 2019-04-13 DIAGNOSIS — K760 Fatty (change of) liver, not elsewhere classified: Secondary | ICD-10-CM

## 2019-04-13 DIAGNOSIS — Z9889 Other specified postprocedural states: Secondary | ICD-10-CM

## 2019-04-13 DIAGNOSIS — F5105 Insomnia due to other mental disorder: Secondary | ICD-10-CM

## 2019-04-13 MED ORDER — BUPROPION HCL 100 MG PO TABS: 100.0000 mg | ORAL_TABLET | Freq: Every day | ORAL | 1 refills | Status: DC

## 2019-04-13 MED ORDER — PAROXETINE HCL 30 MG PO TABS: 30.0000 mg | ORAL_TABLET | Freq: Every day | ORAL | 2 refills | Status: DC

## 2019-04-13 NOTE — Assessment & Plan Note (Addendum)
She has had a net weight loss of 105 LBS.  CURRENT WEIGHT 140 .  She is happy with her current weight despite a BMI > 25  does not feel the need to lose more.  She is not exercising.  The probability of longterm success in maintaining her current weight are diminished by her failurre to appreciate the importance or regular exercise.

## 2019-04-13 NOTE — Progress Notes (Signed)
Virtual Visit via [email or web address]  This visit type was conducted due to national recommendations for restrictions regarding the COVID-19 pandemic (e.g. social distancing).  This format is felt to be most appropriate for this patient at this time.  All issues noted in this document were discussed and addressed.  No physical exam was performed (except for noted visual exam findings with Video Visits).   I connected with@ on 04/13/19 at  9:30 AM EDT by a video enabled telemedicine application and verified that I am speaking with the correct person using two identifiers. Location patient: home Location provider: work or home office Persons participating in the virtual visit: patient, provider  I discussed the limitations, risks, security and privacy concerns of performing an evaluation and management service by telephone and the availability of in person appointments. I also discussed with the patient that there may be a patient responsible charge related to this service. The patient expressed understanding and agreed to proceed.  Reason for visit:   Follow up on chronic conditions including fatty liver with chronic AST/ALT elevation   HPI:  53 yr old female with history of obesity , transaminitis of uncertain etiology, exercise induced asthma,  GAD with insomnia and Fibromyalgia presents for follow up. Last seen in Oct 2019.   Obesity:  She is s/p lap gastric bypass In April 2019.  Wt loss to date  105 LBS.  CURRENT WEIGHT 140 LBS  Does not want to lose any more.  Not exercising .   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  she has noted Increased anxiety re school starting back vs not.  She has been using   Valium prn but does not feel it is helping.  She has been taking  100 mg wellbutrin daily  (intolerant of higher dose) and 60 mg prozac daily along with 1 mg clonazepam at bedtime  No prior trial of paxil.  Did not toelrate zoloft historically.  Talk therapy suspended due to [place]   She had a very rewarding year.   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  Son Lexicographer at [employer] anxious about  Him returning to school.  Son also has fatty liver but has lost 14 lbs by following her low carb diet in just  2 weeks     ROS: See pertinent positives and negatives per HPI.  Past Medical History:  Diagnosis Date  . Anemia, iron deficiency   . Anxiety   . Asthma   . Colon polyps 2014  . Complication of anesthesia    per patient report , woke during endoscopy in 2014 ; no issues with subsequent colonscopy also  in 2014   . Costochondritis    srturggled wiht it since age 56 ; exacerbates with excessive movement of left arm; did phsycial therapy in 08-2017 and hasnt had issues with it since   . Depression   . Eating disorder    Binge eating  . Fibromyalgia   . Gallstones   . Heart murmur    at birth; resolved   . Hyperlipidemia   . Hypertension    was due to birth control  , " as soon as they took it out, it went away  "     Past Surgical History:  Procedure Laterality Date  . CHOLECYSTECTOMY    . COLONOSCOPY     [facility] dr Deatra Ina   . EVALUATION UNDER ANESTHESIA WITH HEMORRHOIDECTOMY N/A 06/24/2018   Procedure: EXAM UNDER ANESTHESIA  WITH EXTERNAL HEMORRHOIDECTOMY;  Surgeon: Johnathan Hausen, MD;  Location: [facility];  Service: General;  Laterality: N/A;  . 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 GANGLION CYST EXCISION    . GASTRIC ROUX-EN-Y N/A 01/24/2018   Procedure: LAPAROSCOPIC ROUX-EN-Y GASTRIC BYPASS WITH UPPER ENDOSCOPY;  Surgeon: Johnathan Hausen, MD;  Location: [facility];  Service: General;  Laterality: N/A;  . SPHINCTEROTOMY N/A 06/24/2018   Procedure: LATERAL INTERNAL SPHINCTEROTOMY;  Surgeon: Johnathan Hausen, MD;  Location: [facility];  Service: General;  Laterality: N/A;  . TONSILLECTOMY  1989    Family History  Problem Relation Age of Onset  . Hyperlipidemia Mother   . Heart disease Mother        Atrial fibrilation  . Cancer Mother         ? Melanoma  . Atrial fibrillation Mother   . Diabetes Father   . Mental illness Father        Bipolar  . Leukemia Father 32       MDS< then leukemia   . Cancer Sister        Clear cell sarcoma in leg  . Hypothyroidism Sister   . Hypertension Maternal Grandmother   . Heart disease Maternal Grandmother        Afib  . Arrhythmia Maternal Grandmother   . Arthritis Paternal Grandmother   . Arrhythmia Maternal Grandfather   . Colon cancer Neg Hx   . Rectal cancer Neg Hx     SOCIAL HX:  reports that she has never smoked. She has never used smokeless tobacco. She reports current alcohol use. She reports that she does not use drugs.   Current Outpatient Medications:  .  buPROPion (WELLBUTRIN) 100 MG tablet, Take 1 tablet (100 mg total) by mouth daily., Disp: 90 tablet, Rfl: 1 .  butalbital-acetaminophen-caffeine (FIORICET, ESGIC) 50-325-40 MG tablet, Take 1-2 tablets by mouth every 6 (six) hours as needed for headache., Disp: 30 tablet, Rfl: 0 .  calcium gluconate 500 MG tablet, Take 1 tablet by mouth 2 (two) times daily. , Disp: , Rfl:  .  clonazePAM (KLONOPIN) 1 MG tablet, TAKE 1 TABLET BY MOUTH AT BEDTIME AS NEEDED FOR SLEEP, Disp: 30 tablet, Rfl: 1 .  diazepam (VALIUM) 10 MG tablet, TAKE 1 TABLET BY MOUTH DAILY AS NEEDED FOR ANXIETY/MUSCLE SPASMS, Disp: 15 tablet, Rfl: 5 .  levonorgestrel (MIRENA) 20 MCG/24HR IUD, 1 Intra Uterine Device (1 each total) by Intrauterine route once., Disp: 1 each, Rfl: 0 .  Multiple Vitamins-Iron (MULTI-VITAMIN/IRON PO), Take 1 tablet by mouth daily. , Disp: , Rfl:  .  omeprazole (PRILOSEC OTC) 20 MG tablet, Take 20 mg by mouth daily before breakfast. , Disp: , Rfl:  .  pramipexole (MIRAPEX) 0.125 MG tablet, TAKE 1 TABLET BY MOUTH DAILY AT BEDTIME. INCREASE WEEKLY AS NEEDED, Disp: 90 tablet, Rfl: 1 .  rizatriptan (MAXALT) 10 MG tablet, TAKE 1 TABLET BY MOUTH AS NEEDED FOR MIGRAINE. MAY REPEAT IN 2 HOURS IF NEEDED, Disp: 10 tablet, Rfl: 2 .  topiramate  (TOPAMAX) 100 MG tablet, TAKE 1 TABLET (100 MG TOTAL) BY MOUTH DAILY., Disp: 90 tablet, Rfl: 1 .  traMADol (ULTRAM) 50 MG tablet, Take 1 tablet (50 mg total) by mouth every 6 (six) hours as needed., Disp: 30 tablet, Rfl: 0 .  Vitamin D, Ergocalciferol, (DRISDOL) 1.25 MG (50000 UT) CAPS capsule, TAKE ONE CAPSULE BY MOUTH ONE TIME PER WEEK, Disp: 12 capsule, Rfl: 3 .  PARoxetine (PAXIL) 30 MG tablet, Take 1  tablet (30 mg total) by mouth daily., Disp: 30 tablet, Rfl: 2  EXAM:  VITALS per patient if applicable:  GENERAL: alert, oriented, appears well and in no acute distress  HEENT: atraumatic, conjunttiva clear, no obvious abnormalities on inspection of external nose and ears  NECK: normal movements of the head and neck  LUNGS: on inspection no signs of respiratory distress, breathing rate appears normal, no obvious gross SOB, gasping or wheezing  CV: no obvious cyanosis  MS: moves all visible extremities without noticeable abnormality  PSYCH/NEURO: pleasant and cooperative, no obvious depression or anxiety, speech and thought processing grossly intact  ASSESSMENT AND PLAN:  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 She has had a net weight loss of 105 LBS.  CURRENT WEIGHT 140 .  She is happy with her current weight despite a BMI > 25  does not feel the need to lose more.  She is not exercising.  The probability of longterm success in maintaining her current weight are diminished by her failurre to appreciate the importance or regular exercise.  Insomnia secondary to anxiety MANAGED CHRONICALLY WITH 1 MG CLONAZEPAM   Generalized anxiety disorder CHANGING PROZAC 60 MG TO PAXIL 30 MG  AS A ONE MONTH TRIAL.  She is advised to continue taking WELLUBTRIN 100 MG once daily  And  CAUTIONED TO AVOID REGULAR USE OF VALIUM,  HAS NOT USED MORE THAN 5 TIMES THIS SUMMER.   Fatty liver Presumed by prior ultrasound, not seen on more recent imaging.   She continues to have elevated ALT/AST .  She has had a GI  evaluation for cause,  No concerns raised.   Lab Results  Component Value Date   ALT 66 (H) 04/05/2019   AST 39 (H) 04/05/2019   ALKPHOS 70 04/05/2019   BILITOT 0.5 04/05/2019     H/O hemorrhoidectomy External hemorrhoidectomy done Sept 2019 by Dr Hassell Done    I discussed the assessment and treatment plan with the patient. The patient was provided an opportunity to ask questions and all were answered. The patient agreed with the plan and demonstrated an understanding of the instructions.   The patient was advised to call back or seek an in-person evaluation if the symptoms worsen or if the condition fails to improve as anticipated.  I provided 25 minutes of non-face-to-face time during this encounter.   [name], MD

## 2019-04-13 NOTE — Assessment & Plan Note (Signed)
MANAGED CHRONICALLY WITH 1 MG CLONAZEPAM

## 2019-04-13 NOTE — Assessment & Plan Note (Addendum)
CHANGING PROZAC 60 MG TO PAXIL 30 MG  AS A ONE MONTH TRIAL.  She is advised to continue taking WELLUBTRIN 100 MG once daily  And  CAUTIONED TO AVOID REGULAR USE OF VALIUM,  HAS NOT USED MORE THAN 5 TIMES THIS SUMMER.

## 2019-04-13 NOTE — Patient Instructions (Signed)
WE made a medication change today from prozac 60 mg daily to Paxil 30 mg daily. tka he paxil in the evening after dinner.  CONGRATULATIONS ON ALL OF YOUR ACHIEVEMENTS!!!!!

## 2019-04-15 NOTE — Assessment & Plan Note (Addendum)
Presumed by prior ultrasound, not seen on more recent imaging.   She continues to have elevated ALT/AST .  She has had a GI evaluation for cause,  No concerns raised.   Lab Results  Component Value Date   ALT 66 (H) 04/05/2019   AST 39 (H) 04/05/2019   ALKPHOS 70 04/05/2019   BILITOT 0.5 04/05/2019

## 2019-04-15 NOTE — Assessment & Plan Note (Signed)
External hemorrhoidectomy done Sept 2019 by Dr Hassell Done

## 2019-04-18 ENCOUNTER — Other Ambulatory Visit: Payer: Self-pay | Admitting: Family Medicine

## 2019-04-18 DIAGNOSIS — G43019 Migraine without aura, intractable, without status migrainosus: Secondary | ICD-10-CM

## 2019-05-01 DIAGNOSIS — G43019 Migraine without aura, intractable, without status migrainosus: Secondary | ICD-10-CM

## 2019-05-04 ENCOUNTER — Other Ambulatory Visit: Payer: Self-pay | Admitting: Internal Medicine

## 2019-05-04 MED ORDER — FLUOXETINE HCL 20 MG PO TABS: 60.0000 mg | ORAL_TABLET | Freq: Every day | ORAL | 3 refills | Status: DC

## 2019-05-05 MED ORDER — RIZATRIPTAN BENZOATE 10 MG PO TABS: ORAL_TABLET | ORAL | 2 refills | Status: DC

## 2019-05-05 NOTE — Addendum Note (Signed)
Addended by: Adair Laundry on: 05/05/2019 08:43 AM   Modules accepted: Orders

## 2019-05-12 ENCOUNTER — Other Ambulatory Visit: Payer: Self-pay | Admitting: Internal Medicine

## 2019-06-04 ENCOUNTER — Other Ambulatory Visit: Payer: Self-pay | Admitting: Internal Medicine

## 2019-06-05 ENCOUNTER — Other Ambulatory Visit: Payer: Self-pay | Admitting: Internal Medicine

## 2019-06-05 NOTE — Telephone Encounter (Signed)
Refilled: 11/28/2018 Last OV: 04/13/2019 Next OV: not scheduled

## 2019-06-06 NOTE — Telephone Encounter (Signed)
Refilled: 03/10/2019 Last OV: 04/13/2019 Next OV: not scheduled

## 2019-07-24 ENCOUNTER — Emergency Department (HOSPITAL_COMMUNITY)
Admission: EM | Admit: 2019-07-24 | Discharge: 2019-07-25 | Disposition: A | Discharge status: Home / Self Care | Payer: BC Managed Care – PPO | Attending: Emergency Medicine | Admitting: Emergency Medicine

## 2019-07-24 ENCOUNTER — Other Ambulatory Visit: Payer: Self-pay

## 2019-07-24 ENCOUNTER — Encounter (HOSPITAL_COMMUNITY): Payer: Self-pay | Admitting: Emergency Medicine

## 2019-07-24 DIAGNOSIS — I10 Essential (primary) hypertension: Secondary | ICD-10-CM | POA: Insufficient documentation

## 2019-07-24 DIAGNOSIS — R1011 Right upper quadrant pain: Secondary | ICD-10-CM

## 2019-07-24 DIAGNOSIS — Z79899 Other long term (current) drug therapy: Secondary | ICD-10-CM | POA: Insufficient documentation

## 2019-07-24 DIAGNOSIS — J45909 Unspecified asthma, uncomplicated: Secondary | ICD-10-CM | POA: Diagnosis not present

## 2019-07-24 DIAGNOSIS — Z9884 Bariatric surgery status: Secondary | ICD-10-CM | POA: Insufficient documentation

## 2019-07-24 LAB — CBC
HCT: 42.4 % (ref 36.0–46.0)
Hemoglobin: 14.1 g/dL (ref 12.0–15.0)
MCH: 31.8 pg (ref 26.0–34.0)
MCHC: 33.3 g/dL (ref 30.0–36.0)
MCV: 95.5 fL (ref 80.0–100.0)
Platelets: 269 10*3/uL (ref 150–400)
RBC: 4.44 MIL/uL (ref 3.87–5.11)
RDW: 11.8 % (ref 11.5–15.5)
WBC: 7.5 10*3/uL (ref 4.0–10.5)
nRBC: 0 % (ref 0.0–0.2)

## 2019-07-24 MED ORDER — SODIUM CHLORIDE 0.9% FLUSH
3.0000 mL | Freq: Once | INTRAVENOUS | Status: AC
  Administered 2019-07-25: 02:00:00 3 mL via INTRAVENOUS

## 2019-07-24 NOTE — ED Triage Notes (Signed)
Patient c/o constant right side abdominal pain since 1500 today. Reports one episode of vomiting upon eating. Hx gastric bypass.

## 2019-07-24 NOTE — ED Provider Notes (Signed)
[facility] DEPT Provider Note   CSN: [id number] Arrival date & time: 07/24/19  2157     History   Chief Complaint Chief Complaint  Patient presents with  . Abdominal Pain    HPI Selena [name] is a 53 y.o. female.     Patient is a 53 year old female with past medical history of SVT, anxiety, and prior gastric bypass approximately 2 years ago performed by Dr. Hassell Done.  She presents today for evaluation of abdominal pain.  This started approximately 3 PM.  The pain is located in the right upper quadrant and is constant.  Pain is gradually worsening.  She denies any nausea or vomiting.  She denies any diarrhea or constipation.  The history is provided by the patient.  Abdominal Pain Pain location:  RUQ and epigastric Pain quality: stabbing   Pain radiates to:  Does not radiate Pain severity:  Moderate Onset quality:  Sudden Duration:  8 hours Timing:  Constant Progression:  Worsening Chronicity:  New Relieved by:  Nothing Worsened by:  Nothing Ineffective treatments:  None tried Associated symptoms: no fever and no nausea     Past Medical History:  Diagnosis Date  . Anemia, iron deficiency   . Anxiety   . Asthma   . Colon polyps 2014  . Complication of anesthesia    per patient report , woke during endoscopy in 2014 ; no issues with subsequent colonscopy also  in 2014   . Costochondritis    srturggled wiht it since age 70 ; exacerbates with excessive movement of left arm; did phsycial therapy in 08-2017 and hasnt had issues with it since   . Depression   . Eating disorder    Binge eating  . Fibromyalgia   . Gallstones   . Heart murmur    at birth; resolved   . Hyperlipidemia   . Hypertension    was due to birth control  , " as soon as they took it out, it went away  "     Patient Active Problem List   Diagnosis Date Noted  . H/O hemorrhoidectomy 05/02/2018  . 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 Chronic depressive  disorder 01/15/2018  . 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 Paroxysmal tachycardia ([place]) 10/27/2017  . Constipation by delayed colonic transit 09/21/2017  . SVT (supraventricular tachycardia) ([place]) 07/10/2017  . Allergic conjunctivitis and rhinitis 10/01/2016  . Atypical chest pain 12/22/2015  . Restless legs syndrome 04/27/2015  . Insomnia secondary to anxiety 12/22/2014  . IBS (irritable bowel syndrome) 04/21/2014  . Hyperlipidemia 04/21/2014  . Vitamin D deficiency 04/21/2014  . Preoperative evaluation to rule out surgical contraindication 04/21/2014  . Cervicalgia 07/25/2013  . Generalized anxiety disorder 07/25/2013  . 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 Snoring disorder 03/11/2013  . Fatty liver 08/20/2011  . 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 KNEE PAIN, BILATERAL 04/01/2010  . Migraine without aura 05/30/2008  . HYPERTENSION 05/30/2008  . ASTHMA, EXERCISE INDUCED 05/30/2008  . PREMENSTRUAL DYSPHORIC SYNDROME 05/30/2008  . Fibromyalgia 05/30/2008    Past Surgical History:  Procedure Laterality Date  . CHOLECYSTECTOMY    . COLONOSCOPY     [facility] endoscopy dr Deatra Ina   . EVALUATION UNDER ANESTHESIA WITH HEMORRHOIDECTOMY N/A 06/24/2018   Procedure: EXAM UNDER ANESTHESIA WITH EXTERNAL HEMORRHOIDECTOMY;  Surgeon: Johnathan Hausen, MD;  Location: [facility];  Service: General;  Laterality: N/A;  . 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 GANGLION CYST EXCISION    . GASTRIC  ROUX-EN-Y N/A 01/24/2018   Procedure: LAPAROSCOPIC ROUX-EN-Y GASTRIC BYPASS WITH UPPER ENDOSCOPY;  Surgeon: Johnathan Hausen, MD;  Location: [facility];  Service: General;  Laterality: N/A;  . SPHINCTEROTOMY N/A 06/24/2018   Procedure: LATERAL INTERNAL SPHINCTEROTOMY;  Surgeon: Johnathan Hausen, MD;  Location: [facility];  Service: General;  Laterality: N/A;  . TONSILLECTOMY  1989     OB History   No obstetric history on file.      Home Medications    Prior to Admission medications   Medication Sig Start Date  End Date Taking? Authorizing Provider  buPROPion (WELLBUTRIN) 100 MG tablet Take 1 tablet (100 mg total) by mouth daily. 04/13/19   [name], MD  butalbital-acetaminophen-caffeine (FIORICET, ESGIC) [phone] MG tablet Take 1-2 tablets by mouth every 6 (six) hours as needed for headache. 10/19/18 10/19/19  [name], MD  calcium gluconate 500 MG tablet Take 1 tablet by mouth 2 (two) times daily.     [provider]  clonazePAM (KLONOPIN) 1 MG tablet TAKE 1 TABLET BY MOUTH EVERY DAY AT BEDTIME AS NEEDED FOR SLEEP 06/06/19   [name], MD  diazepam (VALIUM) 10 MG tablet TAKE 1 TABLET BY MOUTH DAILY AS NEEDED FOR ANXIETY/MUSCLE SPASMS 06/05/19   [name], MD  FLUoxetine (PROZAC) 20 MG tablet TAKE 3 TABLETS BY MOUTH EVERY DAY 05/12/19   [name], MD  levonorgestrel (MIRENA) 20 MCG/24HR IUD 1 Intra Uterine Device (1 each total) by Intrauterine route once. 10/30/12   Janith Lima, MD  Multiple Vitamins-Iron (MULTI-VITAMIN/IRON PO) Take 1 tablet by mouth daily.     [provider]  omeprazole (PRILOSEC OTC) 20 MG tablet Take 20 mg by mouth daily before breakfast.     [provider]  pramipexole (MIRAPEX) 0.125 MG tablet TAKE 1 TABLET BY MOUTH DAILY AT BEDTIME. INCREASE WEEKLY AS NEEDED 03/21/19   [name], MD  rizatriptan (MAXALT) 10 MG tablet May repeat in 2 hours if needed 05/05/19   [name], MD  topiramate (TOPAMAX) 100 MG tablet TAKE 1 TABLET BY MOUTH EVERY DAY 05/12/19   [name], MD  traMADol (ULTRAM) 50 MG tablet Take 1 tablet (50 mg total) by mouth every 6 (six) hours as needed. 09/08/18   [name], MD  Vitamin D, Ergocalciferol, (DRISDOL) 1.25 MG (50000 UT) CAPS capsule TAKE ONE CAPSULE BY MOUTH ONE TIME PER WEEK 03/28/19   [name], MD    Family History Family History  Problem Relation Age of Onset  . Hyperlipidemia Mother   . Heart disease Mother        Atrial fibrilation  . Cancer Mother        ? Melanoma   . Atrial fibrillation Mother   . Diabetes Father   . Mental illness Father        Bipolar  . Leukemia Father 30       MDS< then leukemia   . Cancer Sister        Clear cell sarcoma in leg  . Hypothyroidism Sister   . Hypertension Maternal Grandmother   . Heart disease Maternal Grandmother        Afib  . Arrhythmia Maternal Grandmother   . Arthritis Paternal Grandmother   . Arrhythmia Maternal Grandfather   . Colon cancer Neg Hx   . Rectal cancer Neg Hx     Social History Social History   Tobacco Use  . Smoking status: Never Smoker  . Smokeless  tobacco: Never Used  Substance Use Topics  . Alcohol use: Yes    Comment: occasional  . Drug use: No     Allergies   Fish allergy, Augmentin [amoxicillin-pot clavulanate], Eggs or egg-derived products, and Sulfonamide derivatives   Review of Systems Review of Systems  Constitutional: Negative for fever.  Gastrointestinal: Positive for abdominal pain. Negative for nausea.  All other systems reviewed and are negative.    Physical Exam Updated Vital Signs BP 118/67 (BP Location: Left Arm)   Pulse 68   Temp 98.5 F (36.9 C) (Oral)   Resp 16   Ht 5\' 2"  (1.575 m)   Wt 62.1 kg   SpO2 98%   BMI 25.06 kg/m   Physical Exam Vitals signs and nursing note reviewed.  Constitutional:      General: She is not in acute distress.    Appearance: She is well-developed. She is not diaphoretic.  HENT:     Head: Normocephalic and atraumatic.  Neck:     Musculoskeletal: Normal range of motion and neck supple.  Cardiovascular:     Rate and Rhythm: Normal rate and regular rhythm.     Heart sounds: No murmur. No friction rub. No gallop.   Pulmonary:     Effort: Pulmonary effort is normal. No respiratory distress.     Breath sounds: Normal breath sounds. No wheezing.  Abdominal:     General: Bowel sounds are normal. There is no distension.     Palpations: Abdomen is soft.     Tenderness: There is abdominal tenderness in the right  upper quadrant. There is no right CVA tenderness, left CVA tenderness, guarding or rebound.  Musculoskeletal: Normal range of motion.  Skin:    General: Skin is warm and dry.  Neurological:     Mental Status: She is alert and oriented to person, place, and time.      ED Treatments / Results  Labs (all labs ordered are listed, but only abnormal results are displayed) Labs Reviewed  CBC  LIPASE, BLOOD  COMPREHENSIVE METABOLIC PANEL  URINALYSIS, ROUTINE W REFLEX MICROSCOPIC  HCG, QUANTITATIVE, PREGNANCY    EKG None  Radiology No results found.  Procedures Procedures (including critical care time)  Medications Ordered in ED Medications  sodium chloride flush (NS) 0.9 % injection 3 mL (has no administration in time range)     Initial Impression / Assessment and Plan / ED Course  I have reviewed the triage vital signs and the nursing notes.  Pertinent labs & imaging results that were available during my care of the patient were reviewed by me and considered in my medical decision making (see chart for details).  Patient with history of gastric bypass presenting with complaints of abdominal pain in the right upper quadrant since approximately 3 PM.  The etiology of her pain is unclear as her laboratory studies and CT scan are all unremarkable.  The only significant finding is that of a moderate colonic stool burden.  This will be treated with magnesium citrate.  She will be prescribed a small quantity of pain medicine and is to follow-up if symptoms worsen.  Final Clinical Impressions(s) / ED Diagnoses   Final diagnoses:  None    ED Discharge Orders    None       Veryl Speak, MD 07/25/19 [phone]

## 2019-07-25 ENCOUNTER — Emergency Department (HOSPITAL_COMMUNITY): Payer: BC Managed Care – PPO

## 2019-07-25 ENCOUNTER — Ambulatory Visit (INDEPENDENT_AMBULATORY_CARE_PROVIDER_SITE_OTHER): Payer: BC Managed Care – PPO | Admitting: Internal Medicine

## 2019-07-25 ENCOUNTER — Telehealth: Payer: Self-pay | Admitting: Internal Medicine

## 2019-07-25 ENCOUNTER — Encounter (HOSPITAL_COMMUNITY): Payer: Self-pay

## 2019-07-25 ENCOUNTER — Ambulatory Visit: Payer: Self-pay | Admitting: *Deleted

## 2019-07-25 DIAGNOSIS — R1031 Right lower quadrant pain: Secondary | ICD-10-CM

## 2019-07-25 LAB — URINALYSIS, ROUTINE W REFLEX MICROSCOPIC
Bilirubin Urine: NEGATIVE
Glucose, UA: NEGATIVE mg/dL
Hgb urine dipstick: NEGATIVE
Ketones, ur: 5 mg/dL — AB
Leukocytes,Ua: NEGATIVE
Nitrite: NEGATIVE
Protein, ur: NEGATIVE mg/dL
Specific Gravity, Urine: 1.016 (ref 1.005–1.030)
pH: 5 (ref 5.0–8.0)

## 2019-07-25 LAB — COMPREHENSIVE METABOLIC PANEL
ALT: 81 U/L — ABNORMAL HIGH (ref 0–44)
AST: 40 U/L (ref 15–41)
Albumin: 4 g/dL (ref 3.5–5.0)
Alkaline Phosphatase: 62 U/L (ref 38–126)
Anion gap: 10 (ref 5–15)
BUN: 19 mg/dL (ref 6–20)
CO2: 22 mmol/L (ref 22–32)
Calcium: 9 mg/dL (ref 8.9–10.3)
Chloride: 108 mmol/L (ref 98–111)
Creatinine, Ser: 0.68 mg/dL (ref 0.44–1.00)
GFR calc Af Amer: >60 mL/min (ref >60)
GFR calc non Af Amer: >60 mL/min (ref >60)
Glucose, Bld: 96 mg/dL (ref 70–99)
Potassium: 3.8 mmol/L (ref 3.5–5.1)
Sodium: 140 mmol/L (ref 135–145)
Total Bilirubin: 0.5 mg/dL (ref 0.3–1.2)
Total Protein: 6.5 g/dL (ref 6.5–8.1)

## 2019-07-25 LAB — HCG, QUANTITATIVE, PREGNANCY: hCG, Beta Chain, Quant, S: 1 m[IU]/mL (ref <5)

## 2019-07-25 LAB — LIPASE, BLOOD: Lipase: 34 U/L (ref 11–51)

## 2019-07-25 MED ORDER — PEG 3350-KCL-NABCB-NACL-NASULF 236 G PO SOLR: ORAL | 0 refills | Status: DC

## 2019-07-25 MED ORDER — IOHEXOL 300 MG/ML  SOLN
100.0000 mL | Freq: Once | INTRAMUSCULAR | Status: AC | PRN
  Administered 2019-07-25: 02:00:00 100 mL via INTRAVENOUS

## 2019-07-25 MED ORDER — MORPHINE SULFATE (PF) 4 MG/ML IV SOLN
4.0000 mg | Freq: Once | INTRAVENOUS | Status: AC
  Administered 2019-07-25: 4 mg via INTRAVENOUS
  Filled 2019-07-25: qty 1

## 2019-07-25 MED ORDER — ONDANSETRON 4 MG PO TBDP: 4.0000 mg | ORAL_TABLET | Freq: Three times a day (TID) | ORAL | 0 refills | Status: DC | PRN

## 2019-07-25 MED ORDER — ONDANSETRON HCL 4 MG/2ML IJ SOLN
4.0000 mg | Freq: Once | INTRAMUSCULAR | Status: AC
  Administered 2019-07-25: 01:00:00 4 mg via INTRAVENOUS
  Filled 2019-07-25: qty 2

## 2019-07-25 MED ORDER — SODIUM CHLORIDE (PF) 0.9 % IJ SOLN
INTRAMUSCULAR | Status: AC
  Filled 2019-07-25: qty 50

## 2019-07-25 MED ORDER — HYDROCODONE-ACETAMINOPHEN 5-325 MG PO TABS: 2.0000 | ORAL_TABLET | ORAL | 0 refills | Status: DC | PRN

## 2019-07-25 MED ORDER — HYDROMORPHONE HCL 1 MG/ML IJ SOLN
1.0000 mg | Freq: Once | INTRAMUSCULAR | Status: AC
  Administered 2019-07-25: 1 mg via INTRAVENOUS
  Filled 2019-07-25: qty 1

## 2019-07-25 NOTE — Assessment & Plan Note (Signed)
Constipation suspected given normal CT , normal labs.  GoLytely bowel prep prescribed given lack of improvement overnight with miralax

## 2019-07-25 NOTE — Telephone Encounter (Signed)
Pt called stating that she was seen in the ED yesterday for abd pain. She was advised by her surgeon to go to [facility] and have a CT scan done of her abd. She has had gastric by pass surgery about 2 years ago.  Her labs and urine were ok per pt and the CT scan showed a large amount of stool in her colon, that was thought causing her pain. She was given pain med, advised to take miralax and an enema and also a stool softer. She had a small stool after the fleets enema. She is still in ;pain. She is advised to try a suppository also and drink lots of fluids. Requesting a call back for any other recommendation for her. She is advised that ;pain med will also slow down her bowel.  She voiced understanding. Routing to LB at [facility] for a call back today.  Reason for Disposition . Abdominal pain  Answer Assessment - Initial Assessment Questions 1. LOCATION: "Where does it hurt?"      On the right side abd 2. RADIATION: "Does the pain shoot anywhere else?" (e.g., chest, back)     Goes up towards the belly button 3. ONSET: "When did the pain begin?" (e.g., minutes, hours or days ago)      yesterday 4. SUDDEN: "Gradual or sudden onset?"     sudden 5. PATTERN "Does the pain come and go, or is it constant?"    - If constant: "Is it getting better, staying the same, or worsening?"      (Note: Constant means the pain never goes away completely; most serious pain is constant and it progresses)     - If intermittent: "How long does it last?" "Do you have pain now?"     (Note: Intermittent means the pain goes away completely between bouts)     constant 6. SEVERITY: "How bad is the pain?"  (e.g., Scale 1-10; mild, moderate, or severe)    - MILD (1-3): doesn't interfere with normal activities, abdomen soft and not tender to touch     - MODERATE (4-7): interferes with normal activities or awakens from sleep, tender to touch     - SEVERE (8-10): excruciating pain, doubled over, unable to do  any normal activities       Pain #7 7. RECURRENT SYMPTOM: "Have you ever had this type of abdominal pain before?" If so, ask: "When was the last time?" and "What happened that time?"      n/a 8. AGGRAVATING FACTORS: "Does anything seem to cause this pain?" (e.g., foods, stress, alcohol)     no 9. CARDIAC SYMPTOMS: "Do you have any of the following symptoms: chest pain, difficulty breathing, sweating, nausea?"     no 10. OTHER SYMPTOMS: "Do you have any other symptoms?" (e.g., fever, vomiting, diarrhea)       no 11. PREGNANCY: "Is there any chance you are pregnant?" "When was your last menstrual period?"       n/a  Protocols used: ABDOMINAL PAIN - UPPER-A-AH

## 2019-07-25 NOTE — Addendum Note (Signed)
Addended by: [name] on: 07/25/2019 02:48 PM   Modules accepted: Orders

## 2019-07-25 NOTE — Discharge Instructions (Addendum)
Hydrocodone as prescribed as needed for pain.  Magnesium citrate: Drink the entire 10 ounce bottle mixed with equal parts Sprite or Gatorade for relief of constipation.  This medication is available over-the-counter.  Follow-up with your primary doctor if not improving in the next few days, and return to the ER if you develop worsening pain, high fever, bloody stools, or other new and concerning symptoms.

## 2019-07-25 NOTE — Progress Notes (Signed)
Telephone  Note  This visit type was conducted due to national recommendations for restrictions regarding the COVID-19 pandemic (e.g. social distancing).  This format is felt to be most appropriate for this patient at this time.  All issues noted in this document were discussed and addressed.  No physical exam was performed (except for noted visual exam findings with Video Visits).   I connected with@ on 07/25/19 at  2:30 PM EDT by telephone and verified that I am speaking with the correct person using two identifiers. Location patient: home Location provider: work or home office Persons participating in the virtual visit: patient, provider  I discussed the limitations, risks, security and privacy concerns of performing an evaluation and management service by telephone and the availability of in person appointments. I also discussed with the patient that there may be a patient responsible charge related to this service. The patient expressed understanding and agreed to proceed.  Reason for visit: abdominal pain, nausea, constipation   HPI:   53 yr old female history of gastric bypass 2 years ago presents with 24 hours of dull aching but moderately severe right sided periumbilical abdominal pain pain acc'df by anorexia and one episode of post prandial vomiting that Occurred yesterday.  Sent to [facility] ED by bariatric surgeon to rule out hernia. .  Labs and CT with contrast done , results reviewed.  No evidence of pancreatitis, appendicitis,  Nephrolithiasis  Or diverticultis ,  Or SBO .  IUD in place.   Moderate colonic stool burden noted.   Marland Kitchen She was discharged home with dilaudid and miralax.  Had a small BM today after using Fleet's enema   The dull ache is returning after several hours of dilaudid induced pain free period.   ROS: See pertinent positives and negatives per HPI.  Past Medical History:  Diagnosis Date  . Anemia, iron deficiency   . Anxiety   . Asthma   . Colon polyps 2014   . Complication of anesthesia    per patient report , woke during endoscopy in 2014 ; no issues with subsequent colonscopy also  in 2014   . Costochondritis    srturggled wiht it since age 68 ; exacerbates with excessive movement of left arm; did phsycial therapy in 08-2017 and hasnt had issues with it since   . Depression   . Eating disorder    Binge eating  . Fibromyalgia   . Gallstones   . Heart murmur    at birth; resolved   . Hyperlipidemia   . Hypertension    was due to birth control  , " as soon as they took it out, it went away  "     Past Surgical History:  Procedure Laterality Date  . CHOLECYSTECTOMY    . COLONOSCOPY     [facility] dr Deatra Ina   . EVALUATION UNDER ANESTHESIA WITH HEMORRHOIDECTOMY N/A 06/24/2018   Procedure: EXAM UNDER ANESTHESIA WITH EXTERNAL HEMORRHOIDECTOMY;  Surgeon: Johnathan Hausen, MD;  Location: [facility];  Service: General;  Laterality: N/A;  . 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 GANGLION CYST EXCISION    . GASTRIC ROUX-EN-Y N/A 01/24/2018   Procedure: LAPAROSCOPIC ROUX-EN-Y GASTRIC BYPASS WITH UPPER ENDOSCOPY;  Surgeon: Johnathan Hausen, MD;  Location: [facility];  Service: General;  Laterality: N/A;  . SPHINCTEROTOMY N/A 06/24/2018   Procedure: LATERAL INTERNAL SPHINCTEROTOMY;  Surgeon: Johnathan Hausen, MD;  Location: [facility];  Service: General;  Laterality: N/A;  . [place]  Family History  Problem Relation Age of Onset  . Hyperlipidemia Mother   . Heart disease Mother        Atrial fibrilation  . Cancer Mother        ? Melanoma  . Atrial fibrillation Mother   . Diabetes Father   . Mental illness Father        Bipolar  . Leukemia Father 57       MDS< then leukemia   . Cancer Sister        Clear cell sarcoma in leg  . Hypothyroidism Sister   . Hypertension Maternal Grandmother   . Heart disease Maternal Grandmother        Afib  . Arrhythmia Maternal Grandmother   . Arthritis Paternal Grandmother   .  Arrhythmia Maternal Grandfather   . Colon cancer Neg Hx   . Rectal cancer Neg Hx     SOCIAL HX:  reports that she has never smoked. She has never used smokeless tobacco. She reports current alcohol use. She reports that she does not use drugs.   Current Outpatient Medications:  .  buPROPion (WELLBUTRIN) 100 MG tablet, Take 1 tablet (100 mg total) by mouth daily., Disp: 90 tablet, Rfl: 1 .  butalbital-acetaminophen-caffeine (FIORICET, ESGIC) 50-325-40 MG tablet, Take 1-2 tablets by mouth every 6 (six) hours as needed for headache., Disp: 30 tablet, Rfl: 0 .  calcium gluconate 500 MG tablet, Take 1 tablet by mouth 2 (two) times daily. , Disp: , Rfl:  .  clonazePAM (KLONOPIN) 1 MG tablet, TAKE 1 TABLET BY MOUTH EVERY DAY AT BEDTIME AS NEEDED FOR SLEEP, Disp: 30 tablet, Rfl: 1 .  diazepam (VALIUM) 10 MG tablet, TAKE 1 TABLET BY MOUTH DAILY AS NEEDED FOR ANXIETY/MUSCLE SPASMS, Disp: 15 tablet, Rfl: 5 .  FLUoxetine (PROZAC) 20 MG tablet, TAKE 3 TABLETS BY MOUTH EVERY DAY, Disp: 270 tablet, Rfl: 2 .  HYDROcodone-acetaminophen (NORCO) 5-325 MG tablet, Take 2 tablets by mouth every 4 (four) hours as needed., Disp: 10 tablet, Rfl: 0 .  levonorgestrel (MIRENA) 20 MCG/24HR IUD, 1 Intra Uterine Device (1 each total) by Intrauterine route once., Disp: 1 each, Rfl: 0 .  Multiple Vitamins-Iron (MULTI-VITAMIN/IRON PO), Take 1 tablet by mouth daily. , Disp: , Rfl:  .  omeprazole (PRILOSEC OTC) 20 MG tablet, Take 20 mg by mouth daily before breakfast. , Disp: , Rfl:  .  polyethylene glycol (GOLYTELY/NULYTELY) 236 g solution, Drink 8 to 12 ounces every 30 minutes until stool runs clear, Disp: 4000 mL, Rfl: 0 .  pramipexole (MIRAPEX) 0.125 MG tablet, TAKE 1 TABLET BY MOUTH DAILY AT BEDTIME. INCREASE WEEKLY AS NEEDED, Disp: 90 tablet, Rfl: 1 .  rizatriptan (MAXALT) 10 MG tablet, May repeat in 2 hours if needed, Disp: 10 tablet, Rfl: 2 .  topiramate (TOPAMAX) 100 MG tablet, TAKE 1 TABLET BY MOUTH EVERY DAY, Disp: 90  tablet, Rfl: 1 .  traMADol (ULTRAM) 50 MG tablet, Take 1 tablet (50 mg total) by mouth every 6 (six) hours as needed., Disp: 30 tablet, Rfl: 0 .  Vitamin D, Ergocalciferol, (DRISDOL) 1.25 MG (50000 UT) CAPS capsule, TAKE ONE CAPSULE BY MOUTH ONE TIME PER WEEK, Disp: 12 capsule, Rfl: 3 .  ondansetron (ZOFRAN ODT) 4 MG disintegrating tablet, Take 1 tablet (4 mg total) by mouth every 8 (eight) hours as needed for nausea or vomiting., Disp: 20 tablet, Rfl: 0  EXAM:   General impression: alert, cooperative and articulate.  No signs of being in distress  Lungs:  speech is fluent sentence length suggests that patient is not short of breath and not punctuated by cough, sneezing or sniffing. Marland Kitchen   Psych: affect normal.  speech is articulate and non pressured .  Denies suicidal thoughts   ASSESSMENT AND PLAN:  Discussed the following assessment and plan:  Abdominal pain, RLQ  Abdominal pain, RLQ Constipation suspected given normal CT , normal labs.  GoLytely bowel prep prescribed given lack of improvement overnight with miralax     I discussed the assessment and treatment plan with the patient. The patient was provided an opportunity to ask questions and all were answered. The patient agreed with the plan and demonstrated an understanding of the instructions.   The patient was advised to call back or seek an in-person evaluation if the symptoms worsen or if the condition fails to improve as anticipated.  I provided  22 minutes of non-face-to-face time during this encounter reviewing patient's current problems , recent images, post surgeries.  Providing counseling on the above mentioned problems , and coordination  of care .   [name], MD

## 2019-07-26 ENCOUNTER — Telehealth: Payer: Self-pay

## 2019-07-26 ENCOUNTER — Telehealth: Payer: Self-pay | Admitting: *Deleted

## 2019-07-26 MED ORDER — LACTULOSE 20 GM/30ML PO SOLN: ORAL | 3 refills | Status: DC

## 2019-07-26 MED ORDER — PEG 3350-KCL-NABCB-NACL-NASULF 236 G PO SOLR: ORAL | 0 refills | Status: DC

## 2019-07-26 NOTE — Telephone Encounter (Signed)
Copied from [place] [id number]. Topic: Quick Communication - Rx Refill/Question >> Jul 25, 2019  6:07 PM Erick Blinks wrote: Pt reported that polyethylene glycol (GOLYTELY/NULYTELY) 236 g solution was not available for her at her pharmacy, Rx must be phoned in. Please advise

## 2019-07-26 NOTE — Telephone Encounter (Signed)
Copied from [place] [id number]. Topic: General - Other >> Jul 26, 2019  2:51 PM Rainey Pines A wrote: Patient would like a callback from nurse as soon as possible in regards to medication that was sent to pharmacy. Medication is on back order and patient would like to know what to do next .Please advise.

## 2019-07-26 NOTE — Telephone Encounter (Signed)
Dr. Derrel Nip responded to pt through [facility] and pt has read the mychart message.

## 2019-07-26 NOTE — Telephone Encounter (Signed)
Resent [place] electronically.

## 2019-07-29 ENCOUNTER — Ambulatory Visit (INDEPENDENT_AMBULATORY_CARE_PROVIDER_SITE_OTHER): Payer: BC Managed Care – PPO | Admitting: Family Medicine

## 2019-07-29 ENCOUNTER — Encounter: Payer: Self-pay | Admitting: Family Medicine

## 2019-07-29 ENCOUNTER — Other Ambulatory Visit: Payer: Self-pay

## 2019-07-29 DIAGNOSIS — R1084 Generalized abdominal pain: Secondary | ICD-10-CM | POA: Diagnosis not present

## 2019-07-29 NOTE — Progress Notes (Signed)
[facility] [address], [place], [place] [address] [id number]  Date:  07/29/2019   Name:  Selena [name]   DOB:  1966/09/22   MRN:  [id number]  PCP:  [name], MD    Chief Complaint: No chief complaint on file.   History of Present Illness:  Selena [name] is a 53 y.o. very pleasant female patient who presents with the following:  Primary pt of Dr Derrel Nip Virtual visit today with concern of persistent abd discomfort  Pt location is home, provider is at office Pt ID confirmed with 2 factors, she gives consent for virtual visit today   Pt notes that she went to the ER on 10/19 due to abdominal pain- she had eval with labs and CT scan and was dx with constipation Ct Abdomen Pelvis W Contrast  Result Date: 07/25/2019 CLINICAL DATA:  Acute abdominal pain. EXAM: CT ABDOMEN AND PELVIS WITH CONTRAST TECHNIQUE: Multidetector CT imaging of the abdomen and pelvis was performed using the standard protocol following bolus administration of intravenous contrast. CONTRAST:  121mL OMNIPAQUE IOHEXOL 300 MG/ML  SOLN COMPARISON:  CT 11/10/2013 FINDINGS: Lower chest: The lung bases are clear. Hepatobiliary: Rounded 10 mm fat density lesion may be extracapsular adjacent to the right hepatic dome. This is of doubtful clinical significance. Post cholecystectomy. Common bile duct dilatation of 12 mm, not significantly changed from prior. Pancreas: No ductal dilatation or inflammation. Spleen: Calcified granuloma. Normal in size. Adrenals/Urinary Tract: Normal adrenal glands. No hydronephrosis or perinephric edema. Homogeneous renal enhancement with symmetric excretion on delayed phase imaging. Urinary bladder is partially distended without wall thickening. Stomach/Bowel: Gastric bypass anatomy. Excluded gastric remnant is decompressed. The Roux limb is nondilated. No evidence of obstruction or inflammatory change. Appendix is normal,  series 5, image 31. Moderate volume of stool throughout the colon. No colonic wall thickening or inflammation. Mild sigmoid colonic redundancy. There is mild mesenteric swirling centrally which is likely postsurgical, no evidence of acute bowel abnormality. Vascular/Lymphatic: Mild distal aortic atherosclerosis. No aneurysm. The portal vein and mesenteric vessels are patent. No adenopathy. Reproductive: IUD appropriately positioned in the uterus. No adnexal mass. Other: Trace free fluid in the pelvis, nonspecific. No upper abdominal ascites. No free air. Musculoskeletal: Mild degenerative change at the lumbosacral junction. There are no acute or suspicious osseous abnormalities. IMPRESSION: 1. No acute abnormality or explanation for abdominal pain. 2. Gastric bypass anatomy without complication. 3. Moderate colonic stool burden. Aortic Atherosclerosis (ICD10-I70.0). Electronically Signed   By: Keith Rake M.D.   On: 07/25/2019 02:21    She saw Dr Derrel Nip for follow-up on 10/20 who recommended GoLytely but this was on back order- she has been using lactulose instead.  However unfortunately her symptoms continue- She feels like she is still constipated and has only passed small amount of watery stool If she eats she will feel like there is no room for her food to go, will feel bloated  She did vomit once yesterday after she ate nachos- she thinks this was just too heavy, she felt better for a while and overdid it She had a gastric bypass about 18 months ago  She is s/p chole  She is taking 1 dose of miralax a day right now Her last good BM was maybe 6 days ago- she normally will go twice a day She is passing gas  No fever She teaches pre-K    Patient Active Problem List   Diagnosis  Date Noted  . Abdominal pain, RLQ 07/25/2019  . H/O hemorrhoidectomy 05/02/2018  . 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 Chronic depressive disorder 01/15/2018  . 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 Paroxysmal  tachycardia ([place]) 10/27/2017  . Constipation by delayed colonic transit 09/21/2017  . SVT (supraventricular tachycardia) ([place]) 07/10/2017  . Allergic conjunctivitis and rhinitis 10/01/2016  . Atypical chest pain 12/22/2015  . Restless legs syndrome 04/27/2015  . Insomnia secondary to anxiety 12/22/2014  . IBS (irritable bowel syndrome) 04/21/2014  . Hyperlipidemia 04/21/2014  . Vitamin D deficiency 04/21/2014  . Preoperative evaluation to rule out surgical contraindication 04/21/2014  . Cervicalgia 07/25/2013  . Generalized anxiety disorder 07/25/2013  . 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 Snoring disorder 03/11/2013  . Fatty liver 08/20/2011  . 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 KNEE PAIN, BILATERAL 04/01/2010  . Migraine without aura 05/30/2008  . HYPERTENSION 05/30/2008  . ASTHMA, EXERCISE INDUCED 05/30/2008  . PREMENSTRUAL DYSPHORIC SYNDROME 05/30/2008  . Fibromyalgia 05/30/2008    Past Medical History:  Diagnosis Date  . Anemia, iron deficiency   . Anxiety   . Asthma   . Colon polyps 2014  . Complication of anesthesia    per patient report , woke during endoscopy in 2014 ; no issues with subsequent colonscopy also  in 2014   . Costochondritis    srturggled wiht it since age 49 ; exacerbates with excessive movement of left arm; did phsycial therapy in 08-2017 and hasnt had issues with it since   . Depression   . Eating disorder    Binge eating  . Fibromyalgia   . Gallstones   . Heart murmur    at birth; resolved   . Hyperlipidemia   . Hypertension    was due to birth control  , " as soon as they took it out, it went away  "     Past Surgical History:  Procedure Laterality Date  . CHOLECYSTECTOMY    . COLONOSCOPY     [facility] endoscopy dr Deatra Ina   . EVALUATION UNDER ANESTHESIA WITH HEMORRHOIDECTOMY N/A 06/24/2018   Procedure: EXAM UNDER ANESTHESIA WITH EXTERNAL HEMORRHOIDECTOMY;  Surgeon: Johnathan Hausen, MD;  Location: [facility];  Service: General;   Laterality: N/A;  . 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 GANGLION CYST EXCISION    . GASTRIC ROUX-EN-Y N/A 01/24/2018   Procedure: LAPAROSCOPIC ROUX-EN-Y GASTRIC BYPASS WITH UPPER ENDOSCOPY;  Surgeon: Johnathan Hausen, MD;  Location: [facility];  Service: General;  Laterality: N/A;  . SPHINCTEROTOMY N/A 06/24/2018   Procedure: LATERAL INTERNAL SPHINCTEROTOMY;  Surgeon: Johnathan Hausen, MD;  Location: [facility];  Service: General;  Laterality: N/A;  . TONSILLECTOMY  1989    Social History   Tobacco Use  . Smoking status: Never Smoker  . Smokeless tobacco: Never Used  Substance Use Topics  . Alcohol use: Yes    Comment: occasional  . Drug use: No    Family History  Problem Relation Age of Onset  . Hyperlipidemia Mother   . Heart disease Mother        Atrial fibrilation  . Cancer Mother        ? Melanoma  . Atrial fibrillation Mother   . Diabetes Father   . Mental illness Father        Bipolar  . Leukemia Father 59       MDS< then leukemia   . Cancer Sister        Clear cell sarcoma in leg  .  Hypothyroidism Sister   . Hypertension Maternal Grandmother   . Heart disease Maternal Grandmother        Afib  . Arrhythmia Maternal Grandmother   . Arthritis Paternal Grandmother   . Arrhythmia Maternal Grandfather   . Colon cancer Neg Hx   . Rectal cancer Neg Hx     Allergies  Allergen Reactions  . Fish Allergy Nausea And Vomiting  . Augmentin [Amoxicillin-Pot Clavulanate] Diarrhea    Can take regular amoxicillin with no issues Has patient had a PCN reaction causing immediate rash, facial/tongue/throat swelling, SOB or lightheadedness with hypotension: No Has patient had a PCN reaction causing severe rash involving mucus membranes or skin necrosis: No Has patient had a PCN reaction that required hospitalization: No Has patient had a PCN reaction occurring within the last 10 years: Yes--but DIARRHEA ONLY If all of the above answers are "NO", then may proceed with  Cephalosporin [place]  . Eggs Or Egg-Derived Products Diarrhea and Nausea And Vomiting  . Sulfonamide Derivatives Itching    Medication list has been reviewed and updated.  Current Outpatient Medications on File Prior to Visit  Medication Sig Dispense Refill  . buPROPion (WELLBUTRIN) 100 MG tablet Take 1 tablet (100 mg total) by mouth daily. 90 tablet 1  . butalbital-acetaminophen-caffeine (FIORICET, ESGIC) 50-325-40 MG tablet Take 1-2 tablets by mouth every 6 (six) hours as needed for headache. 30 tablet 0  . calcium gluconate 500 MG tablet Take 1 tablet by mouth 2 (two) times daily.     . clonazePAM (KLONOPIN) 1 MG tablet TAKE 1 TABLET BY MOUTH EVERY DAY AT BEDTIME AS NEEDED FOR SLEEP 30 tablet 1  . diazepam (VALIUM) 10 MG tablet TAKE 1 TABLET BY MOUTH DAILY AS NEEDED FOR ANXIETY/MUSCLE SPASMS 15 tablet 5  . FLUoxetine (PROZAC) 20 MG tablet TAKE 3 TABLETS BY MOUTH EVERY DAY 270 tablet 2  . HYDROcodone-acetaminophen (NORCO) 5-325 MG tablet Take 2 tablets by mouth every 4 (four) hours as needed. 10 tablet 0  . Lactulose 20 GM/30ML SOLN 30 ml every 4 hours until constipation is relieved 236 mL 3  . levonorgestrel (MIRENA) 20 MCG/24HR IUD 1 Intra Uterine Device (1 each total) by Intrauterine route once. 1 each 0  . Multiple Vitamins-Iron (MULTI-VITAMIN/IRON PO) Take 1 tablet by mouth daily.     Marland Kitchen omeprazole (PRILOSEC OTC) 20 MG tablet Take 20 mg by mouth daily before breakfast.     . ondansetron (ZOFRAN ODT) 4 MG disintegrating tablet Take 1 tablet (4 mg total) by mouth every 8 (eight) hours as needed for nausea or vomiting. 20 tablet 0  . polyethylene glycol (GOLYTELY/NULYTELY) 236 g solution Drink 8 to 12 ounces every 30 minutes until stool runs clear 4000 mL 0  . pramipexole (MIRAPEX) 0.125 MG tablet TAKE 1 TABLET BY MOUTH DAILY AT BEDTIME. INCREASE WEEKLY AS NEEDED 90 tablet 1  . rizatriptan (MAXALT) 10 MG tablet May repeat in 2 hours if needed 10 tablet 2  . topiramate (TOPAMAX) 100 MG tablet  TAKE 1 TABLET BY MOUTH EVERY DAY 90 tablet 1  . traMADol (ULTRAM) 50 MG tablet Take 1 tablet (50 mg total) by mouth every 6 (six) hours as needed. 30 tablet 0  . Vitamin D, Ergocalciferol, (DRISDOL) 1.25 MG (50000 UT) CAPS capsule TAKE ONE CAPSULE BY MOUTH ONE TIME PER WEEK 12 capsule 3   No current facility-administered medications on file prior to visit.     Review of Systems:  As per HPI- otherwise negative.   Physical  Examination: There were no vitals filed for this visit. There were no vitals filed for this visit. There is no height or weight on file to calculate BMI. Ideal Body Weight:    Patient observed on video today.  She looks well, no cough, wheezing, distress is noted Her pain is just above and to the right of her belly button  She is not checking vitals at home Assessment and Plan: Generalized abdominal pain  Virtual visit today during Saturday clinic for abdominal pain.  Patient has been seen in the ER and had a CT scan and labs.  These were unrevealing except for increased colonic stool burden  She has continued to be constipated.  This is thought to be the cause of her pain.  Advised her to increase her MiraLAX, she may take 2 or 3 doses a day in hopes of producing stool.  I have explained to her that it is difficult to evaluate abdominal pain without a hands-on examination.  If she is in distress, I advised her to be seen at the ER over the weekend.  Otherwise we should see her in person next week in the office for evaluation.  She states understanding, will let me know if any concerns over the weekend.  I will send her MyChart message which she can reply to if she needs any further advice  Signed Lamar Blinks, MD

## 2019-08-10 ENCOUNTER — Encounter (HOSPITAL_COMMUNITY): Payer: Self-pay

## 2019-08-25 ENCOUNTER — Other Ambulatory Visit: Payer: Self-pay | Admitting: Internal Medicine

## 2019-08-25 NOTE — Telephone Encounter (Signed)
Refilled: 06/06/2019 Last OV: 07/25/2019 Next OV: not scheduled

## 2019-08-25 NOTE — Telephone Encounter (Signed)
Last OV 04/13/19 requesting refill on clonazepam, last filled 06/06/19 for30 with 1 refill?

## 2019-09-04 NOTE — Telephone Encounter (Signed)
Pt is calling to get a sooner appt

## 2019-09-04 NOTE — Telephone Encounter (Signed)
Spoke with pt and she stated that she was seen in the ER 4 weeks ago for the same thing. Pt is scheduled to see a doctor virtually tomorrow instead of waiting until Wednesday.

## 2019-09-05 ENCOUNTER — Ambulatory Visit (INDEPENDENT_AMBULATORY_CARE_PROVIDER_SITE_OTHER): Payer: BC Managed Care – PPO | Admitting: Internal Medicine

## 2019-09-05 ENCOUNTER — Ambulatory Visit
Admission: RE | Admit: 2019-09-05 | Discharge: 2019-09-05 | Disposition: A | Discharge status: Home / Self Care | Payer: BC Managed Care – PPO | Source: Ambulatory Visit | Attending: Internal Medicine | Admitting: Internal Medicine

## 2019-09-05 ENCOUNTER — Telehealth: Payer: Self-pay

## 2019-09-05 ENCOUNTER — Other Ambulatory Visit: Payer: Self-pay | Admitting: Internal Medicine

## 2019-09-05 ENCOUNTER — Other Ambulatory Visit
Admission: RE | Admit: 2019-09-05 | Discharge: 2019-09-05 | Disposition: A | Discharge status: Home / Self Care | Payer: BC Managed Care – PPO | Source: Ambulatory Visit | Attending: Internal Medicine | Admitting: Internal Medicine

## 2019-09-05 ENCOUNTER — Encounter: Payer: Self-pay | Admitting: Internal Medicine

## 2019-09-05 DIAGNOSIS — K760 Fatty (change of) liver, not elsewhere classified: Secondary | ICD-10-CM | POA: Diagnosis not present

## 2019-09-05 DIAGNOSIS — R1011 Right upper quadrant pain: Secondary | ICD-10-CM | POA: Insufficient documentation

## 2019-09-05 DIAGNOSIS — R109 Unspecified abdominal pain: Secondary | ICD-10-CM | POA: Insufficient documentation

## 2019-09-05 DIAGNOSIS — Z9884 Bariatric surgery status: Secondary | ICD-10-CM | POA: Diagnosis not present

## 2019-09-05 LAB — AMYLASE: Amylase: 118 U/L — ABNORMAL HIGH (ref 28–100)

## 2019-09-05 LAB — HEPATIC FUNCTION PANEL
ALT: 76 U/L — ABNORMAL HIGH (ref 0–44)
AST: 32 U/L (ref 15–41)
Albumin: 4.3 g/dL (ref 3.5–5.0)
Alkaline Phosphatase: 76 U/L (ref 38–126)
Bilirubin, Direct: <0.1 mg/dL (ref 0.0–0.2)
Total Bilirubin: 0.7 mg/dL (ref 0.3–1.2)
Total Protein: 7.1 g/dL (ref 6.5–8.1)

## 2019-09-05 LAB — CBC WITH DIFFERENTIAL/PLATELET
Abs Immature Granulocytes: 0.01 10*3/uL (ref 0.00–0.07)
Basophils Absolute: 0.1 10*3/uL (ref 0.0–0.1)
Basophils Relative: 2 %
Eosinophils Absolute: 0.1 10*3/uL (ref 0.0–0.5)
Eosinophils Relative: 2 %
HCT: 41.7 % (ref 36.0–46.0)
Hemoglobin: 14 g/dL (ref 12.0–15.0)
Immature Granulocytes: 0 %
Lymphocytes Relative: 43 %
Lymphs Abs: 2.3 10*3/uL (ref 0.7–4.0)
MCH: 31.7 pg (ref 26.0–34.0)
MCHC: 33.6 g/dL (ref 30.0–36.0)
MCV: 94.3 fL (ref 80.0–100.0)
Monocytes Absolute: 0.4 10*3/uL (ref 0.1–1.0)
Monocytes Relative: 8 %
Neutro Abs: 2.5 10*3/uL (ref 1.7–7.7)
Neutrophils Relative %: 45 %
Platelets: 274 10*3/uL (ref 150–400)
RBC: 4.42 MIL/uL (ref 3.87–5.11)
RDW: 11.9 % (ref 11.5–15.5)
WBC: 5.4 10*3/uL (ref 4.0–10.5)
nRBC: 0 % (ref 0.0–0.2)

## 2019-09-05 LAB — URINALYSIS, ROUTINE W REFLEX MICROSCOPIC
Bilirubin Urine: NEGATIVE
Glucose, UA: NEGATIVE mg/dL
Hgb urine dipstick: NEGATIVE
Ketones, ur: NEGATIVE mg/dL
Leukocytes,Ua: NEGATIVE
Nitrite: NEGATIVE
Protein, ur: NEGATIVE mg/dL
Specific Gravity, Urine: 1.011 (ref 1.005–1.030)
pH: 6 (ref 5.0–8.0)

## 2019-09-05 LAB — BASIC METABOLIC PANEL
Anion gap: 7 (ref 5–15)
BUN: 19 mg/dL (ref 6–20)
CO2: 25 mmol/L (ref 22–32)
Calcium: 9.3 mg/dL (ref 8.9–10.3)
Chloride: 107 mmol/L (ref 98–111)
Creatinine, Ser: 0.78 mg/dL (ref 0.44–1.00)
GFR calc Af Amer: >60 mL/min (ref >60)
GFR calc non Af Amer: >60 mL/min (ref >60)
Glucose, Bld: 78 mg/dL (ref 70–99)
Potassium: 3.7 mmol/L (ref 3.5–5.1)
Sodium: 139 mmol/L (ref 135–145)

## 2019-09-05 LAB — LIPASE, BLOOD: Lipase: 162 U/L — ABNORMAL HIGH (ref 11–51)

## 2019-09-05 NOTE — Telephone Encounter (Signed)
Lab order replaced

## 2019-09-05 NOTE — Progress Notes (Signed)
Patient ID: Selena [name], female   DOB: 1966-07-29, 53 y.o.   MRN: [id number]   Virtual Visit via video Note  This visit type was conducted due to national recommendations for restrictions regarding the COVID-19 pandemic (e.g. social distancing).  This format is felt to be most appropriate for this patient at this time.  All issues noted in this document were discussed and addressed.  No physical exam was performed (except for noted visual exam findings with Video Visits).   I connected with Selena [name] by a video enabled telemedicine application and verified that I am speaking with the correct person using two identifiers. Location patient: home Location provider: work  Persons participating in the virtual visit: patient, provider  I discussed the limitations, risks, security and privacy concerns of performing an evaluation and management service by video and the availability of in person appointments.  The patient expressed understanding and agreed to proceed.   Reason for visit: work in appt.   HPI: Pt of Dr Derrel Nip.  Work in appt. S/p gastric bypass 2 years ago.  Was evaluated 07/24/19 in ER with right side pain.  Note reviewed.  CT - moderate stool.  Saw Dr Derrel Nip 07/25/19.  rx for zofran and GoLytely.  States after several days, symptoms resolved.  Was doing fine until approximately one week ago.  Ate and again noticed increased pain - right side.  Describes pain as cramping.  Notices a "burning"sensation in her back.  States she just curled up and fell asleep.  Has continued to have some discomfort.  Stopped eating solid foods.  Tries to eat again a few days later and had another flare.  Over the last few days, she has only been taking in liquids.  Still with some discomfort, but not having the pain like she has with the acute flares.  She has been taking a stool softener and miralax regularly.  Her bowels are moving - normal regular bowel movements.  No fever.  Does regurgitate food  at times - with flares.  No chest pain.  No sob.     ROS: See pertinent positives and negatives per HPI.  Past Medical History:  Diagnosis Date  . Anemia, iron deficiency   . Anxiety   . Asthma   . Colon polyps 2014  . Complication of anesthesia    per patient report , woke during endoscopy in 2014 ; no issues with subsequent colonscopy also  in 2014   . Costochondritis    srturggled wiht it since age 20 ; exacerbates with excessive movement of left arm; did phsycial therapy in 08-2017 and hasnt had issues with it since   . Depression   . Eating disorder    Binge eating  . Fibromyalgia   . Gallstones   . Heart murmur    at birth; resolved   . Hyperlipidemia   . Hypertension    was due to birth control  , " as soon as they took it out, it went away  "     Past Surgical History:  Procedure Laterality Date  . CHOLECYSTECTOMY    . COLONOSCOPY     [facility] dr Deatra Ina   . EVALUATION UNDER ANESTHESIA WITH HEMORRHOIDECTOMY N/A 06/24/2018   Procedure: EXAM UNDER ANESTHESIA WITH EXTERNAL HEMORRHOIDECTOMY;  Surgeon: Johnathan Hausen, MD;  Location: [facility];  Service: General;  Laterality: N/A;  . 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 GANGLION CYST EXCISION    . GASTRIC ROUX-EN-Y N/A 01/24/2018  Procedure: LAPAROSCOPIC ROUX-EN-Y GASTRIC BYPASS WITH UPPER ENDOSCOPY;  Surgeon: Johnathan Hausen, MD;  Location: [facility];  Service: General;  Laterality: N/A;  . SPHINCTEROTOMY N/A 06/24/2018   Procedure: LATERAL INTERNAL SPHINCTEROTOMY;  Surgeon: Johnathan Hausen, MD;  Location: [facility];  Service: General;  Laterality: N/A;  . TONSILLECTOMY  1989    Family History  Problem Relation Age of Onset  . Hyperlipidemia Mother   . Heart disease Mother        Atrial fibrilation  . Cancer Mother        ? Melanoma  . Atrial fibrillation Mother   . Diabetes Father   . Mental illness Father        Bipolar  . Leukemia Father 39       MDS< then leukemia   . Cancer Sister         Clear cell sarcoma in leg  . Hypothyroidism Sister   . Hypertension Maternal Grandmother   . Heart disease Maternal Grandmother        Afib  . Arrhythmia Maternal Grandmother   . Arthritis Paternal Grandmother   . Arrhythmia Maternal Grandfather   . Colon cancer Neg Hx   . Rectal cancer Neg Hx     SOCIAL HX: reviewed.    Current Outpatient Medications:  .  buPROPion (WELLBUTRIN) 100 MG tablet, Take 1 tablet (100 mg total) by mouth daily., Disp: 90 tablet, Rfl: 1 .  butalbital-acetaminophen-caffeine (FIORICET, ESGIC) 50-325-40 MG tablet, Take 1-2 tablets by mouth every 6 (six) hours as needed for headache., Disp: 30 tablet, Rfl: 0 .  calcium gluconate 500 MG tablet, Take 1 tablet by mouth 2 (two) times daily. , Disp: , Rfl:  .  clonazePAM (KLONOPIN) 1 MG tablet, TAKE 1 TABLET BY MOUTH AT BEDTIME AS NEEDED FOR SLEEP, Disp: 30 tablet, Rfl: 1 .  diazepam (VALIUM) 10 MG tablet, TAKE 1 TABLET BY MOUTH DAILY AS NEEDED FOR ANXIETY/MUSCLE SPASMS, Disp: 15 tablet, Rfl: 5 .  FLUoxetine (PROZAC) 20 MG tablet, TAKE 3 TABLETS BY MOUTH EVERY DAY, Disp: 270 tablet, Rfl: 2 .  levonorgestrel (MIRENA) 20 MCG/24HR IUD, 1 Intra Uterine Device (1 each total) by Intrauterine route once., Disp: 1 each, Rfl: 0 .  Multiple Vitamins-Iron (MULTI-VITAMIN/IRON PO), Take 1 tablet by mouth daily. , Disp: , Rfl:  .  omeprazole (PRILOSEC OTC) 20 MG tablet, Take 20 mg by mouth daily before breakfast. , Disp: , Rfl:  .  ondansetron (ZOFRAN ODT) 4 MG disintegrating tablet, Take 1 tablet (4 mg total) by mouth every 8 (eight) hours as needed for nausea or vomiting., Disp: 20 tablet, Rfl: 0 .  pramipexole (MIRAPEX) 0.125 MG tablet, TAKE 1 TABLET BY MOUTH DAILY AT BEDTIME. INCREASE WEEKLY AS NEEDED, Disp: 90 tablet, Rfl: 1 .  rizatriptan (MAXALT) 10 MG tablet, May repeat in 2 hours if needed, Disp: 10 tablet, Rfl: 2 .  topiramate (TOPAMAX) 100 MG tablet, TAKE 1 TABLET BY MOUTH EVERY DAY, Disp: 90 tablet, Rfl: 1 .  traMADol  (ULTRAM) 50 MG tablet, Take 1 tablet (50 mg total) by mouth every 6 (six) hours as needed., Disp: 30 tablet, Rfl: 0 .  Vitamin D, Ergocalciferol, (DRISDOL) 1.25 MG (50000 UT) CAPS capsule, TAKE ONE CAPSULE BY MOUTH ONE TIME PER WEEK, Disp: 12 capsule, Rfl: 3 .  HYDROcodone-acetaminophen (NORCO) 5-325 MG tablet, Take 2 tablets by mouth every 4 (four) hours as needed., Disp: 10 tablet, Rfl: 0 .  Lactulose 20 GM/30ML SOLN, 30 ml every 4 hours until constipation  is relieved, Disp: 236 mL, Rfl: 3 .  polyethylene glycol (GOLYTELY/NULYTELY) 236 g solution, Drink 8 to 12 ounces every 30 minutes until stool runs clear, Disp: 4000 mL, Rfl: 0  EXAM:  GENERAL: alert, oriented, appears well and in no acute distress  HEENT: atraumatic, conjunttiva clear, no obvious abnormalities on inspection of external nose and ears  NECK: normal movements of the head and neck  LUNGS: on inspection no signs of respiratory distress, breathing rate appears normal, no obvious gross SOB, gasping or wheezing  CV: no obvious cyanosis  PSYCH/NEURO: pleasant and cooperative, no obvious depression or anxiety, speech and thought processing grossly intact  ASSESSMENT AND PLAN:  Discussed the following assessment and plan:  Abdominal pain Intermittent flares - right side/back pain. Is s/p cholecystectomy.  Recent CT scan - moderate stool.  Was treated with GoLytely previously.  Return of symptoms in the last week.  Bowels moving regularly.  No fever.  Does have some regurgitation of food with flares.  Eating bland foods/liquids now.  No solid food.  Is s/p gastric bypass.  Discussed possible need for EGD.  She has seen the bariatric surgeon recently.  Did not feel related to her surgery.  She is s/p cholecystectomy.  Will check cbc, metb, liver panel and pancreas tests.  Also check KUB to confirm no increased stool.  Check urine.  Stick with bland foods/liquids for now.  Discussed further w/u pending results of above.    Fatty  liver One liver test elevated in ER.  States has a history of fatty liver.  CT as outlined.  CBD 30mm.  Felt to be unchanged.  She is s/p cholecystectomy.  96mm fat density lesion - liver.  Recheck liver panel.  Pending results, consider f/u scan.    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  Pain and symptoms as outlined.  May need f/u with GI for possible EGD.  Start with lab and xray first.      I discussed the assessment and treatment plan with the patient. The patient was provided an opportunity to ask questions and all were answered. The patient agreed with the plan and demonstrated an understanding of the instructions.   The patient was advised to call back or seek an in-person evaluation if the symptoms worsen or if the condition fails to improve as anticipated.   Einar Pheasant, MD

## 2019-09-06 ENCOUNTER — Telehealth: Payer: BC Managed Care – PPO | Admitting: Internal Medicine

## 2019-09-06 ENCOUNTER — Encounter: Payer: Self-pay | Admitting: Internal Medicine

## 2019-09-06 NOTE — Assessment & Plan Note (Signed)
S/p gastric surgery.  Pain and symptoms as outlined.  May need f/u with GI for possible EGD.  Start with lab and xray first.

## 2019-09-06 NOTE — Assessment & Plan Note (Signed)
Intermittent flares - right side/back pain. Is s/p cholecystectomy.  Recent CT scan - moderate stool.  Was treated with GoLytely previously.  Return of symptoms in the last week.  Bowels moving regularly.  No fever.  Does have some regurgitation of food with flares.  Eating bland foods/liquids now.  No solid food.  Is s/p gastric bypass.  Discussed possible need for EGD.  She has seen the bariatric surgeon recently.  Did not feel related to her surgery.  She is s/p cholecystectomy.  Will check cbc, metb, liver panel and pancreas tests.  Also check KUB to confirm no increased stool.  Check urine.  Stick with bland foods/liquids for now.  Discussed further w/u pending results of above.

## 2019-09-06 NOTE — Assessment & Plan Note (Signed)
One liver test elevated in ER.  States has a history of fatty liver.  CT as outlined.  CBD 59mm.  Felt to be unchanged.  She is s/p cholecystectomy.  62mm fat density lesion - liver.  Recheck liver panel.  Pending results, consider f/u scan.

## 2019-09-08 NOTE — Telephone Encounter (Signed)
Pt is waiting on Dr. Nicki Reaper to consult with another Dr about what test needs to be done next. 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 Pt has been taking tylenol and can not Motrin / please advise asap

## 2019-09-08 NOTE — Telephone Encounter (Signed)
If she has tramadol and has taken previously and tolerated, ok to take if needed.  I did get information to Ellouise Newer (PA with Dr Loletha Carrow).  Make sure she is aware of her appt next week and make sure she keeps appt.

## 2019-09-08 NOTE — Telephone Encounter (Signed)
Patient says tylenol not helping abdominal pain , is there something else she can take?

## 2019-09-08 NOTE — Telephone Encounter (Signed)
Patient said she has been working remotely and the pain has eased off what she is concerned about when working that she has to move heavy objects make it worse and she is concerned to do. She says  has tramadol at home could she take that if gets bad at school.

## 2019-09-08 NOTE — Telephone Encounter (Signed)
Patient notified and voiced understanding.

## 2019-09-08 NOTE — Telephone Encounter (Signed)
I did send a staff message to Dr Loletha Carrow.  After receiving this message, I called his office.  He is not in the office today and was doing procedures yesterday.  I have not heard back from him, but I did speak with his scheduler and they have scheduled her for an appointment with his PA Ellouise Newer)  for 09/14/19 at 2:30.  I would like for her to keep this appt so that we can figure out the etiology of her pain.  I am not sure how much tylenol she is taking, but her pain had eased off previously.  Is it worsening now.  I would like to hold on narcotic pain medication if possible, given her current symptoms.  I am also going to message his PA and now and see if there is anything more to be done prior to her scheduled appt

## 2019-09-14 ENCOUNTER — Ambulatory Visit: Payer: BC Managed Care – PPO | Admitting: Physician Assistant

## 2019-09-14 ENCOUNTER — Encounter: Payer: Self-pay | Admitting: Physician Assistant

## 2019-09-14 VITALS — BP 92/72 | HR 80 | Temp 98.1°F | Ht 62.0 in | Wt 135.6 lb

## 2019-09-14 DIAGNOSIS — Z1159 Encounter for screening for other viral diseases: Secondary | ICD-10-CM

## 2019-09-14 DIAGNOSIS — R1011 Right upper quadrant pain: Secondary | ICD-10-CM

## 2019-09-14 DIAGNOSIS — R112 Nausea with vomiting, unspecified: Secondary | ICD-10-CM | POA: Diagnosis not present

## 2019-09-14 MED ORDER — HYOSCYAMINE SULFATE 0.125 MG SL SUBL: 0.1250 mg | SUBLINGUAL_TABLET | SUBLINGUAL | 2 refills | Status: DC | PRN

## 2019-09-14 NOTE — Progress Notes (Signed)
____________________________________________________________  Attending physician addendum:  Thank you for sending this case to me, and for having me see her in clinic with you today. I have reviewed the entire note, and the outlined plan is what we discussed.  Wilfrid Lund, MD  ____________________________________________________________

## 2019-09-14 NOTE — Patient Instructions (Signed)
If you are age 51 or older, your body mass index should be between 23-30. Your Body mass index is 24.8 kg/m. If this is out of the aforementioned range listed, please consider follow up with your Primary Care Provider.  If you are age 34 or younger, your body mass index should be between 19-25. Your Body mass index is 24.8 kg/m. If this is out of the aformentioned range listed, please consider follow up with your Primary Care Provider.   You have been scheduled for an Upper GI Series and Small Bowel Follow Thru at [facility]. Your appointment is on 09/21/19 at 10:30 am,. Please arrive 15 minutes prior to your test for registration. Make certain not to have anything to eat or drink after midnight on the night before your test. If you need to reschedule, please contact radiology at [phone]. --------------------------------------------------------------------------------------------------------------- An upper GI series uses x rays to help diagnose problems of the upper GI tract, which includes the esophagus, stomach, and duodenum. The duodenum is the first part of the small intestine. An upper GI series is conducted by a radiology technologist or a radiologist--a doctor who specializes in x-ray imaging--at a hospital or outpatient center. While sitting or standing in front of an x-ray machine, the patient drinks barium liquid, which is often white and has a chalky consistency and taste. The barium liquid coats the lining of the upper GI tract and makes signs of disease show up more clearly on x rays. X-ray video, called fluoroscopy, is used to view the barium liquid moving through the esophagus, stomach, and duodenum. Additional x rays and fluoroscopy are performed while the patient lies on an x-ray table. To fully coat the upper GI tract with barium liquid, the technologist or radiologist may press on the abdomen or ask the patient to change position. Patients hold still in various  positions, allowing the technologist or radiologist to take x rays of the upper GI tract at different angles. If a technologist conducts the upper GI series, a radiologist will later examine the images to look for problems.  This test typically takes about 1 hour to complete --------------------------------------------------------------------------------------------------------------------------------------------- The Small Bowel Follow Thru examination is used to visualize the entire small bowel (intestines); specifically the connection between the small and large intestine. You will be positioned on a flat x-ray table and an image of your abdomen taken. Then the technologist will show the x-ray to the radiologist. The radiologist will instruct your technologist how much (1-2 cups) barium sulfate you will drink and when to begin taking the timed x-rays, usually 15-30 minutes after you begin drinking. Barium is a harmless substance that will highlight your small intestine by absorbing x-ray. The taste is chalky and it feels very heavy both in the cup and in your stomach.  After the first x-ray is taken and shown to the radiologist, he/she will determine when the next image is to be taken. This is repeated until the barium has reached the end of the small intestine and enters the beginning of the colon (cecum). At such time when the barium spills into the colon, you will be positioned on the x-ray table once again. The radiologist will use a fluoroscopic camera to take some detailed pictures of the connection between your small intestine and colon. The fluoroscope is an x-ray unit that works with a television/computer screen. The radiologist will apply pressure to your abdomen with his/her hand and a lead glove, a plastic paddle, or a paddle with an inflated  rubber balloon on the end. This is to spread apart your loops of intestine so he/she can see all areas.   This test typically takes around 1 hour to  complete.  Important - Drink plenty of water (8-10 cups/day) for a few days following the procedure to avoid constipation and blockage. The barium will make your stools white for a few days. ---------------------------------------------------------------------------------------------------------------You have been scheduled for an endoscopy. Please follow written instructions given to you at your visit today. If you use inhalers (even only as needed), please bring them with you on the day of your procedure. Your physician has requested that you go to www.startemmi.com and enter the access code given to you at your visit today. This web site gives a general overview about your procedure. However, you should still follow specific instructions given to you by our office regarding your preparation for the procedure.  We have sent the following medications to your pharmacy for you to pick up at your convenience: Hyoscyamine  Thank you for choosing me and [facility].    Ellouise Newer, PA-C

## 2019-09-14 NOTE — Progress Notes (Signed)
Chief Complaint: Right-sided abdominal pain  HPI:    Selena [name] is a 53 year old female with a past medical history as listed below, known to Dr. Loletha Carrow for her elevated LFTs, who was referred to me by [name], MD for a complaint of right-sided abdominal pain.      09/20/2018 patient seen in clinic by Dr. Loletha Carrow for elevated transaminases that have gone up over the past 6 months.  Iron studies were negative viral hepatitis serologies were negative and it was not thought she had autoimmune hepatitis.  It was discussed that she was on a supplement which she was not familiar with and was not sure commonly it might be implicated in elevated LFTs.  She is provided reassurance repeat LFTs were done as well as hep B surface antigen testing and told to stop biotin for the time being.    09/05/2019 patient had telemedicine visit for right upper quadrant abdominal pain.  It was discussed that she was status post gastric bypass 2 years ago.  She was evaluated 07/24/2019 in the ER with right-sided pain.  CT showed moderate stool.  Saw her other doctor 07/25/2019 with prescription for Zofran and GoLYTELY.  After several days his symptoms resolved and she was doing fine until a week prior to that visit.  At that time describe normal regular bowel movements.  At times discussed she might need an EGD.  KUB was checked.    09/06/2019 abdominal x-ray with no acute findings.  Prior cholecystectomy.  CMP with an ALT minimally elevated at 76 (appears around patient's baseline).  CBC normal.  Amylase minimally elevated at 118.  Lipase elevated at 162.    Today, the patient presents to clinic and explains that she has had 2 acute episodes of right upper quadrant cramping.  Explains the first 1 occurred 6 to 7 weeks ago she ate something about 20 minutes later developed a severe 10 /10 right upper quadrant abdominal cramping which bent her over and then felt burning all the way up her back and into her shoulders as well as  nausea and had multiple episodes of vomiting, the pain persisted so much so that she curled in a ball and passed out on the couch.  This continued until 9 PM when she proceeded to the ER with work-up as above.  CT at that time showed stool in her colon, she was given bowel prep.  The pain lasted for another 4 to 5 days and she stayed on clear liquids, tells me the pain was awful.  Anytime she tried to eat anything solid the cramping would return and she backed off again.  Finally it went away and she started to eat again but the 08/28/2019 she was eating again and 20 minutes later had the exact same symptoms.  Decided to back of her diet and went on liquids and started MiraLAX again just in case, called her PCP and they had labs done with pancreatic enzymes elevated as above.  Tells me the pain lasted for 12 to 13 days this time and she has finally started to go back to a more normal diet but is very nervous that something bad will happen soon.  Can feel some mild RUQ cramping now.  Cannot tell what she is doing to bring it on.  Describes normal full bowel movements at the moment.  Tells me she lost 4 pounds while on liquid diet.    Denies fever, chills or blood in her stool.  Past  Medical History:  Diagnosis Date  . Anemia, iron deficiency   . Anxiety   . Asthma   . Colon polyps 2014  . Complication of anesthesia    per patient report , woke during endoscopy in 2014 ; no issues with subsequent colonscopy also  in 2014   . Costochondritis    srturggled wiht it since age 82 ; exacerbates with excessive movement of left arm; did phsycial therapy in 08-2017 and hasnt had issues with it since   . Depression   . Eating disorder    Binge eating  . Fibromyalgia   . Gallstones   . Heart murmur    at birth; resolved   . Hyperlipidemia   . Hypertension    was due to birth control  , " as soon as they took it out, it went away  "     Past Surgical History:  Procedure Laterality Date  .  CHOLECYSTECTOMY    . COLONOSCOPY     [facility] dr Deatra Ina   . EVALUATION UNDER ANESTHESIA WITH HEMORRHOIDECTOMY N/A 06/24/2018   Procedure: EXAM UNDER ANESTHESIA WITH EXTERNAL HEMORRHOIDECTOMY;  Surgeon: Johnathan Hausen, MD;  Location: [facility];  Service: General;  Laterality: N/A;  . 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 GANGLION CYST EXCISION    . GASTRIC ROUX-EN-Y N/A 01/24/2018   Procedure: LAPAROSCOPIC ROUX-EN-Y GASTRIC BYPASS WITH UPPER ENDOSCOPY;  Surgeon: Johnathan Hausen, MD;  Location: [facility];  Service: General;  Laterality: N/A;  . SPHINCTEROTOMY N/A 06/24/2018   Procedure: LATERAL INTERNAL SPHINCTEROTOMY;  Surgeon: Johnathan Hausen, MD;  Location: [facility];  Service: General;  Laterality: N/A;  . TONSILLECTOMY  1989    Current Outpatient Medications  Medication Sig Dispense Refill  . buPROPion (WELLBUTRIN) 100 MG tablet Take 1 tablet (100 mg total) by mouth daily. 90 tablet 1  . butalbital-acetaminophen-caffeine (FIORICET, ESGIC) 50-325-40 MG tablet Take 1-2 tablets by mouth every 6 (six) hours as needed for headache. 30 tablet 0  . calcium gluconate 500 MG tablet Take 1 tablet by mouth 2 (two) times daily.     . clonazePAM (KLONOPIN) 1 MG tablet TAKE 1 TABLET BY MOUTH AT BEDTIME AS NEEDED FOR SLEEP 30 tablet 1  . diazepam (VALIUM) 10 MG tablet TAKE 1 TABLET BY MOUTH DAILY AS NEEDED FOR ANXIETY/MUSCLE SPASMS 15 tablet 5  . FLUoxetine (PROZAC) 20 MG tablet TAKE 3 TABLETS BY MOUTH EVERY DAY 270 tablet 2  . HYDROcodone-acetaminophen (NORCO) 5-325 MG tablet Take 2 tablets by mouth every 4 (four) hours as needed. 10 tablet 0  . Lactulose 20 GM/30ML SOLN 30 ml every 4 hours until constipation is relieved 236 mL 3  . levonorgestrel (MIRENA) 20 MCG/24HR IUD 1 Intra Uterine Device (1 each total) by Intrauterine route once. 1 each 0  . Multiple Vitamins-Iron (MULTI-VITAMIN/IRON PO) Take 1 tablet by mouth daily.     Marland Kitchen omeprazole (PRILOSEC OTC) 20 MG tablet Take 20 mg by mouth  daily before breakfast.     . ondansetron (ZOFRAN ODT) 4 MG disintegrating tablet Take 1 tablet (4 mg total) by mouth every 8 (eight) hours as needed for nausea or vomiting. 20 tablet 0  . polyethylene glycol (GOLYTELY/NULYTELY) 236 g solution Drink 8 to 12 ounces every 30 minutes until stool runs clear 4000 mL 0  . pramipexole (MIRAPEX) 0.125 MG tablet TAKE 1 TABLET BY MOUTH DAILY AT BEDTIME. INCREASE WEEKLY AS NEEDED 90 tablet 1  . rizatriptan (MAXALT) 10 MG tablet May repeat in  2 hours if needed 10 tablet 2  . topiramate (TOPAMAX) 100 MG tablet TAKE 1 TABLET BY MOUTH EVERY DAY 90 tablet 1  . traMADol (ULTRAM) 50 MG tablet Take 1 tablet (50 mg total) by mouth every 6 (six) hours as needed. 30 tablet 0  . Vitamin D, Ergocalciferol, (DRISDOL) 1.25 MG (50000 UT) CAPS capsule TAKE ONE CAPSULE BY MOUTH ONE TIME PER WEEK 12 capsule 3   No current facility-administered medications for this visit.    Allergies as of 09/14/2019 - Review Complete 09/06/2019  Allergen Reaction Noted  . Fish allergy Nausea And Vomiting 10/04/2017  . Augmentin [amoxicillin-pot clavulanate] Diarrhea 08/08/2016  . Eggs or egg-derived products Diarrhea and Nausea And Vomiting 06/22/2016  . Sulfonamide derivatives Itching     Family History  Problem Relation Age of Onset  . Hyperlipidemia Mother   . Heart disease Mother        Atrial fibrilation  . Cancer Mother        ? Melanoma  . Atrial fibrillation Mother   . Diabetes Father   . Mental illness Father        Bipolar  . Leukemia Father 82       MDS< then leukemia   . Cancer Sister        Clear cell sarcoma in leg  . Hypothyroidism Sister   . Hypertension Maternal Grandmother   . Heart disease Maternal Grandmother        Afib  . Arrhythmia Maternal Grandmother   . Arthritis Paternal Grandmother   . Arrhythmia Maternal Grandfather   . Colon cancer Neg Hx   . Rectal cancer Neg Hx     Social History   Socioeconomic History  . Marital status: Single     Spouse name: Not on file  . Number of children: 1  . Years of education: Not on file  . Highest education level: Not on file  Occupational History  . Occupation: Museum/gallery exhibitions officer: [employer]  Tobacco Use  . Smoking status: Never Smoker  . Smokeless tobacco: Never Used  Substance and Sexual Activity  . Alcohol use: Yes    Comment: occasional  . Drug use: No  . Sexual activity: Yes    Birth control/protection: I.U.D.  Other Topics Concern  . Not on file  Social History Narrative   2 Step children; asthma, ADHD   Social Determinants of Health   Financial Resource Strain:   . Difficulty of Paying Living Expenses: Not on file  Food Insecurity:   . Worried About Charity fundraiser in the Last Year: Not on file  . Ran Out of Food in the Last Year: Not on file  Transportation Needs:   . Lack of Transportation (Medical): Not on file  . Lack of Transportation (Non-Medical): Not on file  Physical Activity:   . Days of Exercise per Week: Not on file  . Minutes of Exercise per Session: Not on file  Stress:   . Feeling of Stress : Not on file  Social Connections:   . Frequency of Communication with Friends and Family: Not on file  . Frequency of Social Gatherings with Friends and Family: Not on file  . Attends Religious Services: Not on file  . Active Member of Clubs or Organizations: Not on file  . Attends Archivist Meetings: Not on file  . Marital Status: Not on file  Intimate Partner Violence:   . Fear of Current or Ex-Partner: Not  on file  . Emotionally Abused: Not on file  . Physically Abused: Not on file  . Sexually Abused: Not on file    Review of Systems:    Constitutional: No fever or chills Cardiovascular: No chest pain Respiratory: No SOB  Gastrointestinal: See HPI and otherwise negative   Physical Exam:  Vital signs: BP 92/72   Pulse 80   Temp 98.1 F (36.7 C)   Ht 5\' 2"  (1.575 m)   Wt 135 lb 9.6 oz (61.5 kg)   BMI 24.80  kg/m   Constitutional:   Pleasant Caucasian female appears to be in NAD, Well developed, Well nourished, alert and cooperative Head:  Normocephalic and atraumatic. Eyes:   PEERL, EOMI. No icterus. Conjunctiva pink. Ears:  Normal auditory acuity. Neck:  Supple Throat: Oral cavity and pharynx without inflammation, swelling or lesion.  Respiratory: Respirations even and unlabored. Lungs clear to auscultation bilaterally.   No wheezes, crackles, or rhonchi.  Cardiovascular: Normal S1, S2. No MRG. Regular rate and rhythm. No peripheral edema, cyanosis or pallor.  Gastrointestinal:  Soft, nondistended, mild RUQ ttp, No rebound or guarding. Normal bowel sounds. No appreciable masses or hepatomegaly. Rectal:  Not performed.  Msk:  Symmetrical without gross deformities. Without edema, no deformity or joint abnormality.  Neurologic:  Alert and  oriented x4;  grossly normal neurologically.  Skin:   Dry and intact without significant lesions or rashes. Psychiatric:  Demonstrates good judgement and reason without abnormal affect or behaviors.  RELEVANT LABS AND IMAGING: CBC    Component Value Date/Time   WBC 5.4 09/05/2019 1455   RBC 4.42 09/05/2019 1455   HGB 14.0 09/05/2019 1455   HCT 41.7 09/05/2019 1455   PLT 274 09/05/2019 1455   MCV 94.3 09/05/2019 1455   MCH 31.7 09/05/2019 1455   MCHC 33.6 09/05/2019 1455   RDW 11.9 09/05/2019 1455   LYMPHSABS 2.3 09/05/2019 1455   MONOABS 0.4 09/05/2019 1455   EOSABS 0.1 09/05/2019 1455   BASOSABS 0.1 09/05/2019 1455    CMP     Component Value Date/Time   NA 139 09/05/2019 1455   K 3.7 09/05/2019 1455   CL 107 09/05/2019 1455   CO2 25 09/05/2019 1455   GLUCOSE 78 09/05/2019 1455   BUN 19 09/05/2019 1455   CREATININE 0.78 09/05/2019 1455   CREATININE 0.86 11/12/2017 1542   CALCIUM 9.3 09/05/2019 1455   PROT 7.1 09/05/2019 1455   ALBUMIN 4.3 09/05/2019 1455   AST 32 09/05/2019 1455   ALT 76 (H) 09/05/2019 1455   ALKPHOS 76 09/05/2019  1455   BILITOT 0.7 09/05/2019 1455   GFRNONAA >60 09/05/2019 1455   GFRAA >60 09/05/2019 1455    Assessment: 1.  Right upper quadrant pain: 2 acute episodes about 20 minutes after eating which resulted in nausea and vomiting and lasted 4 to 5 days the first time in 12 to 13 days the next, history of gastric bypass surgery and previous cholecystectomy, normal LFTs for her during these episodes, moderately elevated lipase and amylase recently; consider stricturing from gastric bypass surgery in the small bowel versus choledocholithiasis versus other 2.  Nausea and vomiting  Plan: 1.  Discussed case with Dr. Loletha Carrow at time of patient's visit and in fact patient was seen by both of [place]. 2.  Ordered upper GI series with small bowel follow-through 3.  Ordered EGD.  Did discuss risks, benefits, limitations and alternatives the patient agrees to proceed.  Patient will be Covid tested 2 days prior time  of procedure. 4.  Would recommend the patient try to continue as normal diet as she can. 5.  Prescribed Hyoscyamine 0.125 mg sublingual tabs every 4-6 hours as needed for pain #15 with a refill. 6.  Patient to follow in clinic per recommendations from Dr. Loletha Carrow after time of procedure.  Ellouise Newer, PA-C [facility] 09/14/2019, 2:34 PM  Cc: [name], MD

## 2019-09-21 ENCOUNTER — Other Ambulatory Visit: Payer: Self-pay

## 2019-09-21 ENCOUNTER — Ambulatory Visit (HOSPITAL_COMMUNITY)
Admission: RE | Admit: 2019-09-21 | Discharge: 2019-09-21 | Disposition: A | Discharge status: Home / Self Care | Payer: BC Managed Care – PPO | Source: Ambulatory Visit | Attending: Physician Assistant | Admitting: Physician Assistant

## 2019-09-21 DIAGNOSIS — R1011 Right upper quadrant pain: Secondary | ICD-10-CM | POA: Diagnosis present

## 2019-09-21 DIAGNOSIS — R112 Nausea with vomiting, unspecified: Secondary | ICD-10-CM

## 2019-09-24 ENCOUNTER — Other Ambulatory Visit: Payer: Self-pay | Admitting: Internal Medicine

## 2019-09-25 ENCOUNTER — Ambulatory Visit (INDEPENDENT_AMBULATORY_CARE_PROVIDER_SITE_OTHER): Payer: BC Managed Care – PPO

## 2019-09-25 ENCOUNTER — Other Ambulatory Visit: Payer: Self-pay | Admitting: Gastroenterology

## 2019-09-25 DIAGNOSIS — Z1159 Encounter for screening for other viral diseases: Secondary | ICD-10-CM

## 2019-09-26 LAB — SARS CORONAVIRUS 2 (TAT 6-24 HRS): SARS Coronavirus 2: NEGATIVE

## 2019-09-27 ENCOUNTER — Encounter: Payer: Self-pay | Admitting: Gastroenterology

## 2019-09-27 ENCOUNTER — Other Ambulatory Visit: Payer: Self-pay

## 2019-09-27 ENCOUNTER — Ambulatory Visit: Payer: BC Managed Care – PPO | Admitting: Gastroenterology

## 2019-09-27 VITALS — BP 106/61 | HR 74 | Temp 98.6°F | Resp 13 | Ht 62.0 in | Wt 135.0 lb

## 2019-09-27 DIAGNOSIS — R1011 Right upper quadrant pain: Secondary | ICD-10-CM

## 2019-09-27 MED ORDER — SODIUM CHLORIDE 0.9 % IV SOLN: 500.0000 mL | Freq: Once | INTRAVENOUS | Status: DC

## 2019-09-27 NOTE — Op Note (Signed)
[place] Patient Name: Selena [name] Procedure Date: 09/27/2019 1:36 PM MRN: [id number] Endoscopist: Mallie Mussel L. Loletha Carrow , MD Age: 53 Referring MD:  Date of Birth: Apr 28, 1966 Gender: Female Account #: [id number] Procedure:                Upper GI endoscopy Indications:              Recent onset episodic Abdominal pain in the right                            upper quadrant (prior GBP, recent UGIS/SBFT normal                            - no retrograde visualization of duodenal limb).                            Prior cholecystectomy. Recently saw surgeon. Since                            GI office evaluation, less severe episodes have                            occurred and improved with as-needed use of                            hyoscyamine. Medicines:                Monitored Anesthesia Care Procedure:                Pre-Anesthesia Assessment:                           - Prior to the procedure, a History and Physical                            was performed, and patient medications and                            allergies were reviewed. The patient's tolerance of                            previous anesthesia was also reviewed. The risks                            and benefits of the procedure and the sedation                            options and risks were discussed with the patient.                            All questions were answered, and informed consent                            was obtained. Prior Anticoagulants: The patient has  taken no previous anticoagulant or antiplatelet                            agents. ASA Grade Assessment: II - A patient with                            mild systemic disease. After reviewing the risks                            and benefits, the patient was deemed in                            satisfactory condition to undergo the procedure.                           After obtaining informed consent, the  endoscope was                            passed under direct vision. Throughout the                            procedure, the patient's blood pressure, pulse, and                            oxygen saturations were monitored continuously. The                            Endoscope was introduced through the mouth, and                            advanced to the jejunum. The upper GI endoscopy was                            accomplished without difficulty. The patient                            tolerated the procedure well. Scope In: Scope Out: Findings:                 The esophagus was normal.                           Evidence of a gastric bypass was found. A gastric                            pouch with a normal size was found. The staple line                            appeared intact. The gastrojejunal anastomosis was                            characterized by healthy appearing mucosa. This was  widely patent and easily traversed. The                            pouch-to-jejunum limb was characterized by healthy                            appearing mucosa. 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                           The examined jejunum was normal. Complications:            No immediate complications. Estimated Blood Loss:     Estimated blood loss: none. Impression:               - Normal esophagus.                           - Gastric bypass with a normal-sized pouch and                            intact staple line. Gastrojejunal anastomosis                            characterized by healthy appearing mucosa.                           - Normal examined jejunum.                           - No specimens collected.                           No cause of symptoms seen. If escalates, refer back                             to surgery for consideration of possible                            obstructive symptoms. Recommendation:           - Patient has a contact number available for                            emergencies. The signs and symptoms of potential                            delayed complications were discussed with the                            patient. Return to normal activities tomorrow.  Written discharge instructions were provided to the                            patient.                           - Resume previous diet.                           - Continue present medications. Macenzie Burford L. Loletha Carrow, MD 09/27/2019 2:02:38 PM This report has been signed electronically.

## 2019-09-27 NOTE — Patient Instructions (Signed)
YOU HAD AN ENDOSCOPIC PROCEDURE TODAY AT THE [facility]:   Refer to the procedure report that was given to you for any specific questions about what was found during the examination.  If the procedure report does not answer your questions, please call your gastroenterologist to clarify.  If you requested that your care partner not be given the details of your procedure findings, then the procedure report has been included in a sealed envelope for you to review at your convenience later.  YOU SHOULD EXPECT: Some feelings of bloating in the abdomen. Passage of more gas than usual.  Walking can help get rid of the air that was put into your GI tract during the procedure and reduce the bloating. If you had a lower endoscopy (such as a colonoscopy or flexible sigmoidoscopy) you may notice spotting of blood in your stool or on the toilet paper. If you underwent a bowel prep for your procedure, you may not have a normal bowel movement for a few days.  Please Note:  You might notice some irritation and congestion in your nose or some drainage.  This is from the oxygen used during your procedure.  There is no need for concern and it should clear up in a day or so.  SYMPTOMS TO REPORT IMMEDIATELY:   Following upper endoscopy (EGD)  Vomiting of blood or coffee ground material  New chest pain or pain under the shoulder blades  Painful or persistently difficult swallowing  New shortness of breath  Fever of 100F or higher  Black, tarry-looking stools  For urgent or emergent issues, a gastroenterologist can be reached at any hour by calling [phone].   DIET:  We do recommend a small meal at first, but then you may proceed to your regular diet.  Drink plenty of fluids but you should avoid alcoholic beverages for 24 hours.  ACTIVITY:  You should plan to take it easy for the rest of today and you should NOT DRIVE or use heavy machinery until tomorrow (because of the sedation medicines used  during the test).    FOLLOW UP: Our staff will call the number listed on your records 48-72 hours following your procedure to check on you and address any questions or concerns that you may have regarding the information given to you following your procedure. If we do not reach you, we will leave a message.  We will attempt to reach you two times.  During this call, we will ask if you have developed any symptoms of COVID 19. If you develop any symptoms (ie: fever, flu-like symptoms, shortness of breath, cough etc.) before then, please call [phone].  If you test positive for Covid 19 in the 2 weeks post procedure, please call and report this information to us.    If any biopsies were taken you will be contacted by phone or by letter within the next 1-3 weeks.  Please call us at [phone] if you have not heard about the biopsies in 3 weeks.    SIGNATURES/CONFIDENTIALITY: You and/or your care partner have signed paperwork which will be entered into your electronic medical record.  These signatures attest to the fact that that the information above on your After Visit Summary has been reviewed and is understood.  Full responsibility of the confidentiality of this discharge information lies with you and/or your care-partner. 

## 2019-10-02 ENCOUNTER — Telehealth: Payer: Self-pay

## 2019-10-02 NOTE — Telephone Encounter (Signed)
  Follow up Call-  Call back number 09/27/2019  Post procedure Call Back phone  # [phone]  Permission to leave phone message Yes  Some recent data might be hidden     Patient questions:  Do you have a fever, pain , or abdominal swelling? No. Pain Score  0 *  Have you tolerated food without any problems? Yes.    Have you been able to return to your normal activities? Yes.    Do you have any questions about your discharge instructions: Diet   No. Medications  No. Follow up visit  No.  Do you have questions or concerns about your Care? No.  Actions: * If pain score is 4 or above: No action needed, pain <4. 1. Have you developed a fever since your procedure? no  2.   Have you had an respiratory symptoms (SOB or cough) since your procedure? no  3.   Have you tested positive for COVID 19 since your procedure no  4.   Have you had any family members/close contacts diagnosed with the COVID 19 since your procedure?  no   If yes to any of these questions please route to Joylene John, RN and Alphonsa Gin, Therapist, sports.

## 2019-10-12 ENCOUNTER — Other Ambulatory Visit: Payer: Self-pay | Admitting: Internal Medicine

## 2019-10-12 ENCOUNTER — Encounter: Payer: Self-pay | Admitting: Family Medicine

## 2019-10-12 DIAGNOSIS — G43019 Migraine without aura, intractable, without status migrainosus: Secondary | ICD-10-CM

## 2019-10-18 ENCOUNTER — Other Ambulatory Visit: Payer: Self-pay

## 2019-10-18 ENCOUNTER — Encounter: Payer: Self-pay | Admitting: Internal Medicine

## 2019-10-18 ENCOUNTER — Ambulatory Visit (INDEPENDENT_AMBULATORY_CARE_PROVIDER_SITE_OTHER): Payer: BC Managed Care – PPO | Admitting: Internal Medicine

## 2019-10-18 DIAGNOSIS — R1031 Right lower quadrant pain: Secondary | ICD-10-CM

## 2019-10-18 MED ORDER — DICYCLOMINE HCL 10 MG PO CAPS: 10.0000 mg | ORAL_CAPSULE | Freq: Three times a day (TID) | ORAL | 2 refills | Status: DC

## 2019-10-18 NOTE — Assessment & Plan Note (Signed)
Source of pain remains unclear.  SB emptied in 55 minutes per UGI study .  Trial of dicyclomine pre meal  and magnesium instead of metamucil to improve motility without expanding lumen

## 2019-10-18 NOTE — Progress Notes (Signed)
Virtual Visit via [email or web address]  This visit type was conducted due to national recommendations for restrictions regarding the COVID-19 pandemic (e.g. social distancing).  This format is felt to be most appropriate for this patient at this time.  All issues noted in this document were discussed and addressed.  No physical exam was performed (except for noted visual exam findings with Video Visits).   I connected with@ on 10/18/19 at  8:00 AM EST by a video enabled telemedicine application  and verified that I am speaking with the correct person using two identifiers. Location patient: office  Location provider: home office Persons participating in the virtual visit: patient, provider  I discussed the limitations, risks, security and privacy concerns of performing an evaluation and management service by telephone and the availability of in person appointments. I also discussed with the patient that there may be a patient responsible charge related to this service. The patient expressed understanding and agreed to proceed.  Reason for visit: persistent abd cramping,   HPI:  54 yr old female s/p Gastric bypass surgery in 2018, s/p cholecystectomy ,  presents with continued inability to tolerate solid  foods due to cramping,  Post prandial pain and fullness lasting up to 12 hours,  And intermittent vomiting. Pain is localized to right mid section lateral to umbilicus   hyoscyamine no longer working EGD normal Dec 2020 UGI SBFT normal Dec 2020 but distal SB not visualized well Moves bowels 1-2 times daily,  Stools are small caliber/hard despite  taking miralax and stool softener daily Estimates water intake at 45 ounces daily   Has not tried bentyl,  Magnesium for constipation ,  And very slow titration of solid food.  Weight is stable on protein shakes.   Seeing General surgeon for second opinion today      ROS: See pertinent positives and negatives per HPI.  Past Medical History:  Diagnosis  Date  . Anemia, iron deficiency   . Anxiety   . Asthma   . Colon polyps 2014  . Complication of anesthesia    per patient report , woke during endoscopy in 2014 ; no issues with subsequent colonscopy also  in 2014   . Costochondritis    srturggled wiht it since age 74 ; exacerbates with excessive movement of left arm; did phsycial therapy in 08-2017 and hasnt had issues with it since   . Depression   . Eating disorder    Binge eating  . Fibromyalgia   . Gallstones   . GERD (gastroesophageal reflux disease)   . Heart murmur    at birth; resolved   . Hyperlipidemia   . Hypertension    was due to birth control  , " as soon as they took it out, it went away  "     Past Surgical History:  Procedure Laterality Date  . CHOLECYSTECTOMY    . COLONOSCOPY     [facility] dr Deatra Ina   . EVALUATION UNDER ANESTHESIA WITH HEMORRHOIDECTOMY N/A 06/24/2018   Procedure: EXAM UNDER ANESTHESIA WITH EXTERNAL HEMORRHOIDECTOMY;  Surgeon: Johnathan Hausen, MD;  Location: [facility];  Service: General;  Laterality: N/A;  . 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 GANGLION CYST EXCISION    . GASTRIC ROUX-EN-Y N/A 01/24/2018   Procedure: LAPAROSCOPIC ROUX-EN-Y GASTRIC BYPASS WITH UPPER ENDOSCOPY;  Surgeon: Johnathan Hausen, MD;  Location: [facility];  Service: General;  Laterality: N/A;  . SPHINCTEROTOMY N/A 06/24/2018   Procedure: LATERAL INTERNAL SPHINCTEROTOMY;  Surgeon: Hassell Done,  Rodman Key, MD;  Location: [facility];  Service: General;  Laterality: N/A;  . TONSILLECTOMY  1989    Family History  Problem Relation Age of Onset  . Hyperlipidemia Mother   . Heart disease Mother        Atrial fibrilation  . Cancer Mother        ? Melanoma  . Atrial fibrillation Mother   . Diabetes Father   . Mental illness Father        Bipolar  . Leukemia Father 8       MDS< then leukemia   . Cancer Sister        Clear cell sarcoma in leg  . Hypothyroidism Sister   . Hypertension Maternal Grandmother   . Heart  disease Maternal Grandmother        Afib  . Arrhythmia Maternal Grandmother   . Arthritis Paternal Grandmother   . Arrhythmia Maternal Grandfather   . Colon cancer Neg Hx   . Rectal cancer Neg Hx   . Pancreatic cancer Neg Hx     SOCIAL HX:  reports that she has never smoked. She has never used smokeless tobacco. She reports current alcohol use. She reports that she does not use drugs.   Current Outpatient Medications:  .  buPROPion (WELLBUTRIN) 100 MG tablet, Take 1 tablet (100 mg total) by mouth daily., Disp: 90 tablet, Rfl: 1 .  calcium gluconate 500 MG tablet, Take 1 tablet by mouth 2 (two) times daily. , Disp: , Rfl:  .  clonazePAM (KLONOPIN) 1 MG tablet, TAKE 1 TABLET BY MOUTH AT BEDTIME AS NEEDED FOR SLEEP, Disp: 30 tablet, Rfl: 1 .  diazepam (VALIUM) 10 MG tablet, TAKE 1 TABLET BY MOUTH DAILY AS NEEDED FOR ANXIETY/MUSCLE SPASMS, Disp: 15 tablet, Rfl: 5 .  FLUoxetine (PROZAC) 20 MG tablet, TAKE 3 TABLETS BY MOUTH EVERY DAY, Disp: 270 tablet, Rfl: 2 .  hyoscyamine (LEVSIN SL) 0.125 MG SL tablet, Place 1 tablet (0.125 mg total) under the tongue every 4 (four) hours as needed (Take one every 4-6 hours as needed for pain)., Disp: 15 tablet, Rfl: 2 .  levonorgestrel (MIRENA) 20 MCG/24HR IUD, 1 Intra Uterine Device (1 each total) by Intrauterine route once., Disp: 1 each, Rfl: 0 .  Multiple Vitamins-Iron (MULTI-VITAMIN/IRON PO), Take 1 tablet by mouth daily. , Disp: , Rfl:  .  omeprazole (PRILOSEC OTC) 20 MG tablet, Take 20 mg by mouth daily before breakfast. , Disp: , Rfl:  .  ondansetron (ZOFRAN ODT) 4 MG disintegrating tablet, Take 1 tablet (4 mg total) by mouth every 8 (eight) hours as needed for nausea or vomiting., Disp: 20 tablet, Rfl: 0 .  pramipexole (MIRAPEX) 0.125 MG tablet, TAKE 1 TABLET BY MOUTH DAILY AT BEDTIME. INCREASE WEEKLY AS NEEDED, Disp: 90 tablet, Rfl: 1 .  rizatriptan (MAXALT) 10 MG tablet, MAY REPEAT IN 2 HOURS IF NEEDED, Disp: 10 tablet, Rfl: 2 .  topiramate  (TOPAMAX) 100 MG tablet, TAKE 1 TABLET BY MOUTH EVERY DAY, Disp: 90 tablet, Rfl: 1 .  traMADol (ULTRAM) 50 MG tablet, Take 1 tablet (50 mg total) by mouth every 6 (six) hours as needed., Disp: 30 tablet, Rfl: 0 .  Vitamin D, Ergocalciferol, (DRISDOL) 1.25 MG (50000 UT) CAPS capsule, TAKE ONE CAPSULE BY MOUTH ONE TIME PER WEEK, Disp: 12 capsule, Rfl: 3 .  butalbital-acetaminophen-caffeine (FIORICET, ESGIC) 50-325-40 MG tablet, Take 1-2 tablets by mouth every 6 (six) hours as needed for headache. (Patient not taking: Reported on 10/18/2019), Disp: 30 tablet,  Rfl: 0 .  dicyclomine (BENTYL) 10 MG capsule, Take 1 capsule (10 mg total) by mouth 4 (four) times daily -  before meals and at bedtime., Disp: 120 capsule, Rfl: 2  EXAM:  VITALS per patient if applicable:  GENERAL: alert, oriented, appears well and in no acute distress  HEENT: atraumatic, conjunttiva clear, no obvious abnormalities on inspection of external nose and ears  NECK: normal movements of the head and neck  LUNGS: on inspection no signs of respiratory distress, breathing rate appears normal, no obvious gross SOB, gasping or wheezing  CV: no obvious cyanosis  MS: moves all visible extremities without noticeable abnormality  PSYCH/NEURO: pleasant and cooperative, no obvious depression or anxiety, speech and thought processing grossly intact  ASSESSMENT AND PLAN:  Discussed the following assessment and plan:  Abdominal pain, RLQ  Abdominal pain, RLQ Source of pain remains unclear.  SB emptied in 55 minutes per UGI study .  Trial of dicyclomine pre meal  and magnesium instead of metamucil to improve motility without expanding lumen    I discussed the assessment and treatment plan with the patient. The patient was provided an opportunity to ask questions and all were answered. The patient agreed with the plan and demonstrated an understanding of the instructions.   The patient was advised to call back or seek an in-person  evaluation if the symptoms worsen or if the condition fails to improve as anticipated. [name], MD   I provided  30 minutes of non-face-to-face time during this encounter reviewing patient's current problem,  and past procedures/imaging studies, providing counseling on the above mentioned problem , and coordination  of care .

## 2019-10-25 ENCOUNTER — Other Ambulatory Visit: Payer: Self-pay | Admitting: Surgery

## 2019-10-27 ENCOUNTER — Other Ambulatory Visit: Payer: Self-pay | Admitting: Emergency Medicine

## 2019-10-27 DIAGNOSIS — Z9884 Bariatric surgery status: Secondary | ICD-10-CM

## 2019-11-12 ENCOUNTER — Other Ambulatory Visit: Payer: Self-pay | Admitting: Internal Medicine

## 2019-11-18 ENCOUNTER — Ambulatory Visit
Admission: RE | Admit: 2019-11-18 | Discharge: 2019-11-18 | Disposition: A | Discharge status: Home / Self Care | Payer: BC Managed Care – PPO | Source: Ambulatory Visit | Attending: Surgery | Admitting: Surgery

## 2019-11-18 ENCOUNTER — Other Ambulatory Visit: Payer: Self-pay

## 2019-11-18 DIAGNOSIS — Z9884 Bariatric surgery status: Secondary | ICD-10-CM

## 2019-11-18 MED ORDER — GADOBENATE DIMEGLUMINE 529 MG/ML IV SOLN
12.0000 mL | Freq: Once | INTRAVENOUS | Status: AC | PRN
  Administered 2019-11-18: 14:00:00 12 mL via INTRAVENOUS

## 2019-11-20 ENCOUNTER — Telehealth: Payer: Self-pay | Admitting: Internal Medicine

## 2019-11-20 ENCOUNTER — Telehealth: Payer: Self-pay | Admitting: Gastroenterology

## 2019-11-20 DIAGNOSIS — R1031 Right lower quadrant pain: Secondary | ICD-10-CM

## 2019-11-20 NOTE — Telephone Encounter (Signed)
Patient calling- stating that the MRI that was ordered the results are back. She stated that the MRI was ordered by a different doctor and Dr. Loletha Carrow had told her to let him know when the results were back to he could review the MRI.

## 2019-11-20 NOTE — Telephone Encounter (Signed)
FYI-results are back from Dr. Hassell Done. Pt wanted PCP to be aware

## 2019-11-21 NOTE — Assessment & Plan Note (Signed)
Referred to GI and bariatric surgeon  MRCP done Feb 2021 and  No evidence of obstructi nor fatty liver,  A benign lipoma was noted.

## 2019-11-21 NOTE — Telephone Encounter (Signed)
Dr. Loletha Carrow, please review MR abd MRCP. Results in Epic.

## 2019-11-23 NOTE — Telephone Encounter (Signed)
Radiologist reports that there is only a small benign area of increased fat in the liver, otherwise a normal study.  Specifically, no stone seen in the bile duct.  Last I knew, she was seeing her surgeon, Dr. Hassell Done, but I do not think I have seen in office note from that.  I was wondering whether she could potentially have intra-abdominal adhesions.  She was having ongoing right upper quadrant pain, the cause of which was unclear after extensive testing.  She had asked about a colonoscopy, please see prior my chart note for my thoughts on that.  I doubted it would be revealing for cause of the pain, but she did have a previous history of colon polyp, so I was offering a colonoscopy if she wished to pursue that.

## 2019-11-24 NOTE — Telephone Encounter (Signed)
Pt notified via mychart and asked to contact the office if she wishes to schedule colon.

## 2019-12-02 ENCOUNTER — Other Ambulatory Visit: Payer: Self-pay | Admitting: Internal Medicine

## 2020-01-22 ENCOUNTER — Telehealth: Payer: Self-pay | Admitting: Internal Medicine

## 2020-01-22 ENCOUNTER — Other Ambulatory Visit: Payer: Self-pay | Admitting: Internal Medicine

## 2020-01-22 DIAGNOSIS — G43019 Migraine without aura, intractable, without status migrainosus: Secondary | ICD-10-CM

## 2020-01-22 MED ORDER — RIZATRIPTAN BENZOATE 10 MG PO TABS: 10.0000 mg | ORAL_TABLET | ORAL | 2 refills | Status: DC | PRN

## 2020-01-22 NOTE — Telephone Encounter (Signed)
Pt states that pharmacy will not fill rizatriptan (MAXALT) 10 MG tablet because the directions are not clear enough on exactly how it should be taken. Please correct and send to [facility] in [place]. Pt is completely out and is having headaches

## 2020-01-22 NOTE — Telephone Encounter (Signed)
maxalt rx corrected and re sent to [facility]

## 2020-03-04 ENCOUNTER — Other Ambulatory Visit: Payer: Self-pay | Admitting: Internal Medicine

## 2020-03-06 ENCOUNTER — Other Ambulatory Visit: Payer: Self-pay | Admitting: Internal Medicine

## 2020-03-06 NOTE — Telephone Encounter (Signed)
I did not refill the clonazepam since the diazepam was refilled.  She needs to make an appt to discuss

## 2020-03-06 NOTE — Telephone Encounter (Signed)
Refill request for klonopin, last seen 10-18-19, last filled 12-02-19.  Please advise.

## 2020-03-18 ENCOUNTER — Other Ambulatory Visit: Payer: Self-pay | Admitting: Internal Medicine

## 2020-03-24 ENCOUNTER — Other Ambulatory Visit: Payer: Self-pay | Admitting: Internal Medicine

## 2020-03-25 NOTE — Telephone Encounter (Signed)
Refilled: 09/25/2019 Last OV: 10/18/2019 Next OV: not scheduled

## 2020-04-05 ENCOUNTER — Encounter: Payer: Self-pay | Admitting: Internal Medicine

## 2020-04-05 ENCOUNTER — Other Ambulatory Visit: Payer: Self-pay

## 2020-04-05 ENCOUNTER — Ambulatory Visit (INDEPENDENT_AMBULATORY_CARE_PROVIDER_SITE_OTHER): Payer: BC Managed Care – PPO | Admitting: Internal Medicine

## 2020-04-05 VITALS — BP 104/66 | HR 70 | Temp 98.2°F | Resp 15 | Ht 62.0 in | Wt 134.8 lb

## 2020-04-05 DIAGNOSIS — F5105 Insomnia due to other mental disorder: Secondary | ICD-10-CM

## 2020-04-05 DIAGNOSIS — G2581 Restless legs syndrome: Secondary | ICD-10-CM

## 2020-04-05 DIAGNOSIS — F419 Anxiety disorder, unspecified: Secondary | ICD-10-CM

## 2020-04-05 DIAGNOSIS — R1011 Right upper quadrant pain: Secondary | ICD-10-CM

## 2020-04-05 DIAGNOSIS — R634 Abnormal weight loss: Secondary | ICD-10-CM

## 2020-04-05 LAB — CBC WITH DIFFERENTIAL/PLATELET
Absolute Monocytes: 558 cells/uL (ref 200–950)
Basophils Absolute: 68 cells/uL (ref 0–200)
Basophils Relative: 1.1 %
Eosinophils Absolute: 143 cells/uL (ref 15–500)
Eosinophils Relative: 2.3 %
HCT: 40.8 % (ref 35.0–45.0)
Hemoglobin: 13.8 g/dL (ref 11.7–15.5)
Lymphs Abs: 2300 cells/uL (ref 850–3900)
MCH: 32.6 pg (ref 27.0–33.0)
MCHC: 33.8 g/dL (ref 32.0–36.0)
MCV: 96.5 fL (ref 80.0–100.0)
MPV: 10.3 fL (ref 7.5–12.5)
Monocytes Relative: 9 %
Neutro Abs: 3131 cells/uL (ref 1500–7800)
Neutrophils Relative %: 50.5 %
Platelets: 263 10*3/uL (ref 140–400)
RBC: 4.23 10*6/uL (ref 3.80–5.10)
RDW: 11.9 % (ref 11.0–15.0)
Total Lymphocyte: 37.1 %
WBC: 6.2 10*3/uL (ref 3.8–10.8)

## 2020-04-05 LAB — COMPREHENSIVE METABOLIC PANEL
AG Ratio: 1.9 (calc) (ref 1.0–2.5)
ALT: 30 U/L — ABNORMAL HIGH (ref 6–29)
AST: 22 U/L (ref 10–35)
Albumin: 4 g/dL (ref 3.6–5.1)
Alkaline phosphatase (APISO): 62 U/L (ref 37–153)
BUN: 12 mg/dL (ref 7–25)
CO2: 25 mmol/L (ref 20–32)
Calcium: 8.9 mg/dL (ref 8.6–10.4)
Chloride: 107 mmol/L (ref 98–110)
Creat: 0.7 mg/dL (ref 0.50–1.05)
Globulin: 2.1 g/dL (calc) (ref 1.9–3.7)
Glucose, Bld: 89 mg/dL (ref 65–99)
Potassium: 3.9 mmol/L (ref 3.5–5.3)
Sodium: 138 mmol/L (ref 135–146)
Total Bilirubin: 0.3 mg/dL (ref 0.2–1.2)
Total Protein: 6.1 g/dL (ref 6.1–8.1)

## 2020-04-05 LAB — IRON,TIBC AND FERRITIN PANEL
%SAT: 16 % (calc) (ref 16–45)
Ferritin: 41 ng/mL (ref 16–232)
Iron: 53 ug/dL (ref 45–160)
TIBC: 336 mcg/dL (calc) (ref 250–450)

## 2020-04-05 MED ORDER — TRAZODONE HCL 50 MG PO TABS: 25.0000 mg | ORAL_TABLET | Freq: Every evening | ORAL | 3 refills | Status: DC | PRN

## 2020-04-05 MED ORDER — TOPIRAMATE 100 MG PO TABS: 100.0000 mg | ORAL_TABLET | Freq: Every day | ORAL | 1 refills | Status: DC

## 2020-04-05 NOTE — Progress Notes (Signed)
Subjective:  Patient ID: Selena [name], female    DOB: 04-20-1966  Age: 55 y.o. MRN: [id number]  CC: The primary encounter diagnosis was Restless legs. Diagnoses of Weight loss, Insomnia secondary to anxiety, and Right upper quadrant abdominal pain were also pertinent to this visit.  HPI Selena [name] presents for follow up on multiple issues..  Last seen   GAD/Depression managed with wellbutrin and prozac,  And insomnia managed for years with 1 mg clonazepam .    This visit occurred during the SARS-CoV-2 public health emergency.  Safety protocols were in place, including screening questions prior to the visit, additional usage of staff PPE, and extensive cleaning of exam room while observing appropriate contact time as indicated for disinfecting solutions.   she has been without clonazepam for 3-4 weeks .  She had no withdrawal symptoms ,  But is not sleeping well.  Her typical sleep habits are as follows:  Falls asleep from 8 to 12,  Then wide awake from 12 until 3.  Bedtime hygiene reviewed,  Patient has been using an electronic book to read before bed.  Does not drink caffeinated beverages after 3 PM.  Only voids  bladder once per night.  No snoring partner. Does not drink alcohol to excess.  Not exercising excessively in the evening. Patient does have a history of anxiety but does not lie awake worrying about issues that cannot be resolved. Does not take stimulants. .  Migraines:  Has been without topiramate for several weeks as well.  Migraines occurring about 3 times per week.  Abdominal pain:  Previous episodes of post prandial pain with solid food has resolved since starting a daily magnesium supplement.   Outpatient Medications Prior to Visit  Medication Sig Dispense Refill  . buPROPion (WELLBUTRIN) 100 MG tablet Take 1 tablet (100 mg total) by mouth daily. 90 tablet 1  . calcium gluconate 500 MG tablet Take 1 tablet by mouth 2 (two) times daily.     . clonazePAM (KLONOPIN) 1  MG tablet TAKE 1 TABLET BY MOUTH EVERY DAY AT BEDTIME AS NEEDED FOR SLEEP 30 tablet 1  . diazepam (VALIUM) 10 MG tablet TAKE 1 TABLET BY MOUTH DAILY AS NEEDED FOR ANXIETY/MUSCLE SPASMS 30 tablet 0  . dicyclomine (BENTYL) 10 MG capsule Take 1 capsule (10 mg total) by mouth 4 (four) times daily -  before meals and at bedtime. 120 capsule 2  . FLUoxetine (PROZAC) 20 MG tablet TAKE 3 TABLETS BY MOUTH EVERY DAY 270 tablet 2  . hyoscyamine (LEVSIN SL) 0.125 MG SL tablet Place 1 tablet (0.125 mg total) under the tongue every 4 (four) hours as needed (Take one every 4-6 hours as needed for pain). 15 tablet 2  . levonorgestrel (MIRENA) 20 MCG/24HR IUD 1 Intra Uterine Device (1 each total) by Intrauterine route once. 1 each 0  . Multiple Vitamins-Iron (MULTI-VITAMIN/IRON PO) Take 1 tablet by mouth daily.     Marland Kitchen omeprazole (PRILOSEC OTC) 20 MG tablet Take 20 mg by mouth daily before breakfast.     . ondansetron (ZOFRAN ODT) 4 MG disintegrating tablet Take 1 tablet (4 mg total) by mouth every 8 (eight) hours as needed for nausea or vomiting. 20 tablet 0  . pramipexole (MIRAPEX) 0.125 MG tablet TAKE 1 TABLET BY MOUTH DAILY AT BEDTIME. INCREASE WEEKLY AS NEEDED 90 tablet 1  . rizatriptan (MAXALT) 10 MG tablet Take 1 tablet (10 mg total) by mouth as needed for migraine. May repeat in 2 hours if needed 10  tablet 2  . traMADol (ULTRAM) 50 MG tablet Take 1 tablet (50 mg total) by mouth every 6 (six) hours as needed. 30 tablet 0  . Vitamin D, Ergocalciferol, (DRISDOL) 1.25 MG (50000 UNIT) CAPS capsule TAKE 1 CAPSULE BY MOUTH ONE TIME PER WEEK 12 capsule 3  . topiramate (TOPAMAX) 100 MG tablet TAKE 1 TABLET BY MOUTH EVERY DAY 90 tablet 1   No facility-administered medications prior to visit.    Review of Systems;  Patient denies headache, fevers, malaise, unintentional weight loss, skin rash, eye pain, sinus congestion and sinus pain, sore throat, dysphagia,  hemoptysis , cough, dyspnea, wheezing, chest pain,  palpitations, orthopnea, edema, abdominal pain, nausea, melena, diarrhea, constipation, flank pain, dysuria, hematuria, urinary  Frequency, nocturia, numbness, tingling, seizures,  Focal weakness, Loss of consciousness,  Tremor, insomnia, depression, anxiety, and suicidal ideation.      Objective:  BP 104/66 (BP Location: Left Arm, Patient Position: Sitting, Cuff Size: Normal)   Pulse 70   Temp 98.2 F (36.8 C) (Temporal)   Resp 15   Ht 5\' 2"  (1.575 m)   Wt 134 lb 12.8 oz (61.1 kg)   SpO2 97%   BMI 24.66 kg/m   BP Readings from Last 3 Encounters:  04/05/20 104/66  09/27/19 106/61  09/14/19 92/72    Wt Readings from Last 3 Encounters:  04/05/20 134 lb 12.8 oz (61.1 kg)  10/18/19 133 lb (60.3 kg)  09/27/19 135 lb (61.2 kg)    General appearance: alert, cooperative and appears stated age Ears: normal TM's and external ear canals both ears Throat: lips, mucosa, and tongue normal; teeth and gums normal Neck: no adenopathy, no carotid bruit, supple, symmetrical, trachea midline and thyroid not enlarged, symmetric, no tenderness/mass/nodules Back: symmetric, no curvature. ROM normal. No CVA tenderness. Lungs: clear to auscultation bilaterally Heart: regular rate and rhythm, S1, S2 normal, no murmur, click, rub or gallop Abdomen: soft, non-tender; bowel sounds normal; no masses,  no organomegaly Pulses: 2+ and symmetric Skin: Skin color, texture, turgor normal. No rashes or lesions Lymph nodes: Cervical, supraclavicular, and axillary nodes normal.  Lab Results  Component Value Date   HGBA1C 5.7 05/06/2018   HGBA1C 5.8 05/17/2017   HGBA1C 5.5 05/21/2016    Lab Results  Component Value Date   CREATININE 0.70 04/05/2020   CREATININE 0.78 09/05/2019   CREATININE 0.68 07/24/2019    Lab Results  Component Value Date   WBC 6.2 04/05/2020   HGB 13.8 04/05/2020   HCT 40.8 04/05/2020   PLT 263 04/05/2020   GLUCOSE 89 04/05/2020   CHOL 156 04/05/2019   TRIG 59.0 04/05/2019     HDL 56.00 04/05/2019   LDLDIRECT 171 (H) 05/21/2016   LDLCALC 89 04/05/2019   ALT 30 (H) 04/05/2020   AST 22 04/05/2020   NA 138 04/05/2020   K 3.9 04/05/2020   CL 107 04/05/2020   CREATININE 0.70 04/05/2020   BUN 12 04/05/2020   CO2 25 04/05/2020   TSH 1.18 09/20/2017   INR 1.0 07/13/2008   HGBA1C 5.7 05/06/2018   MICROALBUR 0.50 06/17/2012    MR ABDOMEN MRCP W WO CONTAST  Result Date: 11/18/2019 CLINICAL DATA:  Liver lesion on CT EXAM: MRI ABDOMEN WITHOUT AND WITH CONTRAST (INCLUDING MRCP) TECHNIQUE: Multiplanar multisequence MR imaging of the abdomen was performed both before and after the administration of intravenous contrast. Heavily T2-weighted images of the biliary and pancreatic ducts were obtained, and three-dimensional MRCP images were rendered by post processing. CONTRAST:  6mL MULTIHANCE  GADOBENATE DIMEGLUMINE 529 MG/ML IV SOLN COMPARISON:  CT abdomen/pelvis dated 07/25/2019 FINDINGS: Lower chest: Lung bases are clear. Hepatobiliary: No morphologic findings of cirrhosis. No hepatic steatosis. 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 No suspicious hepatic lesions. Status post cholecystectomy. No intrahepatic ductal dilatation. Common duct is mildly prominent centrally, measuring 13 mm (series 9/image 25), but smoothly tapering at the ampulla, likely postsurgical. No choledocholithiasis is seen. Pancreas:  Within normal limits. Spleen:  Within normal limits. Adrenals/Urinary Tract:  Adrenal glands are within normal limits. Kidneys are within normal limits.  No hydronephrosis. Stomach/Bowel: Stomach is notable for postsurgical changes related to gastric bypass. No evidence of bowel obstruction. Visualized bowel is grossly unremarkable. Vascular/Lymphatic:  No evidence of abdominal aortic aneurysm. No suspicious abdominal lymphadenopathy. Other:   No abdominal ascites. Musculoskeletal: No focal osseous lesions. 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 No follow-up imaging is required. Electronically Signed   By: Julian Hy M.D.   On: 11/18/2019 19:04    Assessment & Plan:   Problem List Items Addressed This Visit      Unprioritized   Insomnia secondary to anxiety    Encouraged to try alternatives rather than resume clonazepam.  Starting with melatonin at dinner time  And trazodone in the evening       Relevant Medications   traZODone (DESYREL) 50 MG tablet   Abdominal pain    Reviewed workup  , which included MRCP.  Finally Attributed to constipation,  Resolved with magnesium supplementation        Other Visit Diagnoses    Restless legs    -  Primary   Relevant Orders   Iron, TIBC and Ferritin Panel (Completed)   CBC with Differential/Platelet (Completed)   Weight loss       Relevant Orders   Comprehensive metabolic panel (Completed)      I have changed Selena [name]'s topiramate. I am also having her start on traZODone. Additionally, I am having her maintain her levonorgestrel, omeprazole, calcium gluconate, Multiple Vitamins-Iron (MULTI-VITAMIN/IRON PO), traMADol, buPROPion, FLUoxetine, ondansetron, hyoscyamine, dicyclomine, clonazePAM, rizatriptan, diazepam, Vitamin D (Ergocalciferol), and pramipexole.  Meds ordered this encounter  Medications  . traZODone (DESYREL) 50 MG tablet    Sig: Take 0.5-1 tablets (25-50 mg total) by mouth at bedtime as needed for sleep.    Dispense:  30 tablet    Refill:  3  . topiramate (TOPAMAX) 100 MG tablet    Sig: Take 1 tablet (100 mg total) by mouth daily.    Dispense:  90 tablet    Refill:  1    Medications Discontinued During This Encounter  Medication Reason  . topiramate (TOPAMAX) 100 MG tablet Reorder    Follow-up: No follow-ups on  file.   [name], MD

## 2020-04-05 NOTE — Patient Instructions (Signed)
I recommend trying melatonin for your insomnia.  It is not a sedative,  But must be taken on  a regular basis to help your internal clock.  Take every evening after dinner start with 5 mg dose   Max effective dose is 10 mg  You can also try using " Headspace".  This  is a phone app that teaches you to meditate and empty your mind of thoughts that interfere with space    Start 5 mg melatonin at dinner time daily for 3 days and increase to 10 mg   If not sleepy at bedtime  after 3-4 days,  Add the trazodone at  Bedtime  Start with 25 mg and increase to 50 mg if needed    Let me know in 2 weeks if sleep has improved

## 2020-04-07 NOTE — Assessment & Plan Note (Addendum)
Reviewed workup  , which included MRCP.  Finally Attributed to constipation,  Resolved with magnesium supplementation

## 2020-04-07 NOTE — Assessment & Plan Note (Signed)
Encouraged to try alternatives rather than resume clonazepam.  Starting with melatonin at dinner time  And trazodone in the evening

## 2020-04-14 ENCOUNTER — Other Ambulatory Visit: Payer: Self-pay | Admitting: Internal Medicine

## 2020-04-14 DIAGNOSIS — G43019 Migraine without aura, intractable, without status migrainosus: Secondary | ICD-10-CM

## 2020-04-27 ENCOUNTER — Other Ambulatory Visit: Payer: Self-pay | Admitting: Internal Medicine

## 2020-04-29 ENCOUNTER — Encounter: Payer: Self-pay | Admitting: Nurse Practitioner

## 2020-04-29 ENCOUNTER — Other Ambulatory Visit: Payer: Self-pay

## 2020-04-29 ENCOUNTER — Ambulatory Visit (INDEPENDENT_AMBULATORY_CARE_PROVIDER_SITE_OTHER): Payer: BC Managed Care – PPO | Admitting: Nurse Practitioner

## 2020-04-29 ENCOUNTER — Other Ambulatory Visit: Payer: Self-pay | Admitting: Internal Medicine

## 2020-04-29 VITALS — BP 102/60 | HR 83 | Temp 98.7°F | Ht 62.0 in | Wt 133.8 lb

## 2020-04-29 DIAGNOSIS — G43019 Migraine without aura, intractable, without status migrainosus: Secondary | ICD-10-CM | POA: Diagnosis not present

## 2020-04-29 MED ORDER — KETOROLAC TROMETHAMINE 60 MG/2ML IM SOLN
60.0000 mg | Freq: Once | INTRAMUSCULAR | Status: AC
  Administered 2020-04-29: 60 mg via INTRAMUSCULAR

## 2020-04-29 NOTE — Progress Notes (Addendum)
Established Patient Office Visit  Subjective:  Patient ID: Selena [name], female    DOB: April 06, 1966  Age: 54 y.o. MRN: [id number]  CC:  Chief Complaint  Patient presents with  . Acute Visit    cluster migraines    HPI Selena Grieves a 54 yo who  presents for a migraine HA lasting for  6 days and reports this is her typical HA. Her current HA started on Fri in the back of her head and she has taken her usual meds and it continues to bother her. She has nausea -no vomiting relieved with Zofran. She is getting a little dizzy feeling intermittent-similar to vertigo that she gets at rare intervals. No fever chills/neck stiffness.No loss of vision, speech difficulty,  facial droop or extremity weakness, balance issues. She gets this type of HA only once a year and usually in the spring. She has chronic HA and takes Topamax at night as a preventative. If she gets a migraine , she will take a Maxalt and Zofran and they usually resolve. The only time it does not work is if the weather is bad.  He is [phone] thank you  Past Medical History:  Diagnosis Date  . Anemia, iron deficiency   . Anxiety   . Asthma   . Colon polyps 2014  . Complication of anesthesia    per patient report , woke during endoscopy in 2014 ; no issues with subsequent colonscopy also  in 2014   . Costochondritis    srturggled wiht it since age 42 ; exacerbates with excessive movement of left arm; did phsycial therapy in 08-2017 and hasnt had issues with it since   . Depression   . Eating disorder    Binge eating  . Fibromyalgia   . Gallstones   . GERD (gastroesophageal reflux disease)   . Heart murmur    at birth; resolved   . Hyperlipidemia   . Hypertension    was due to birth control  , " as soon as they took it out, it went away  "     Past Surgical History:  Procedure Laterality Date  . CHOLECYSTECTOMY    . COLONOSCOPY     [facility] dr Deatra Ina   . EVALUATION UNDER ANESTHESIA WITH  HEMORRHOIDECTOMY N/A 06/24/2018   Procedure: EXAM UNDER ANESTHESIA WITH EXTERNAL HEMORRHOIDECTOMY;  Surgeon: Johnathan Hausen, MD;  Location: [facility];  Service: General;  Laterality: N/A;  . 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 GANGLION CYST EXCISION    . GASTRIC ROUX-EN-Y N/A 01/24/2018   Procedure: LAPAROSCOPIC ROUX-EN-Y GASTRIC BYPASS WITH UPPER ENDOSCOPY;  Surgeon: Johnathan Hausen, MD;  Location: [facility];  Service: General;  Laterality: N/A;  . SPHINCTEROTOMY N/A 06/24/2018   Procedure: LATERAL INTERNAL SPHINCTEROTOMY;  Surgeon: Johnathan Hausen, MD;  Location: [facility];  Service: General;  Laterality: N/A;  . TONSILLECTOMY  1989    Family History  Problem Relation Age of Onset  . Hyperlipidemia Mother   . Heart disease Mother        Atrial fibrilation  . Cancer Mother        ? Melanoma  . Atrial fibrillation Mother   . Diabetes Father   . Mental illness Father        Bipolar  . Leukemia Father 46       MDS< then leukemia   . Cancer Sister        Clear cell sarcoma in leg  . Hypothyroidism Sister   .  Hypertension Maternal Grandmother   . Heart disease Maternal Grandmother        Afib  . Arrhythmia Maternal Grandmother   . Arthritis Paternal Grandmother   . Arrhythmia Maternal Grandfather   . Colon cancer Neg Hx   . Rectal cancer Neg Hx   . Pancreatic cancer Neg Hx     Social History   Socioeconomic History  . Marital status: Single    Spouse name: Not on file  . Number of children: 1  . Years of education: Not on file  . Highest education level: Not on file  Occupational History  . Occupation: Museum/gallery exhibitions officer: [employer]  Tobacco Use  . Smoking status: Never Smoker  . Smokeless tobacco: Never Used  Vaping Use  . Vaping Use: Never used  Substance and Sexual Activity  . Alcohol use: Yes    Comment: occasional  . Drug use: No  . Sexual activity: Yes    Birth control/protection: I.U.D.  Other Topics Concern  . Not on file    Social History Narrative   2 Step children; asthma, ADHD   Social Determinants of Health   Financial Resource Strain:   . Difficulty of Paying Living Expenses:   Food Insecurity:   . Worried About Charity fundraiser in the Last Year:   . Arboriculturist in the Last Year:   Transportation Needs:   . Film/video editor (Medical):   Marland Kitchen Lack of Transportation (Non-Medical):   Physical Activity:   . Days of Exercise per Week:   . Minutes of Exercise per Session:   Stress:   . Feeling of Stress :   Social Connections:   . Frequency of Communication with Friends and Family:   . Frequency of Social Gatherings with Friends and Family:   . Attends Religious Services:   . Active Member of Clubs or Organizations:   . Attends Archivist Meetings:   Marland Kitchen Marital Status:   Intimate Partner Violence:   . Fear of Current or Ex-Partner:   . Emotionally Abused:   Marland Kitchen Physically Abused:   . Sexually Abused:     Outpatient Medications Prior to Visit  Medication Sig Dispense Refill  . buPROPion (WELLBUTRIN) 100 MG tablet Take 1 tablet (100 mg total) by mouth daily. 90 tablet 1  . calcium gluconate 500 MG tablet Take 1 tablet by mouth 2 (two) times daily.     . clonazePAM (KLONOPIN) 1 MG tablet TAKE 1 TABLET BY MOUTH EVERY DAY AT BEDTIME AS NEEDED FOR SLEEP 30 tablet 1  . diazepam (VALIUM) 10 MG tablet TAKE 1 TABLET BY MOUTH DAILY AS NEEDED FOR ANXIETY/MUSCLE SPASMS 30 tablet 0  . dicyclomine (BENTYL) 10 MG capsule Take 1 capsule (10 mg total) by mouth 4 (four) times daily -  before meals and at bedtime. 120 capsule 2  . FLUoxetine (PROZAC) 20 MG tablet TAKE 3 TABLETS BY MOUTH EVERY DAY 270 tablet 2  . hyoscyamine (LEVSIN SL) 0.125 MG SL tablet Place 1 tablet (0.125 mg total) under the tongue every 4 (four) hours as needed (Take one every 4-6 hours as needed for pain). 15 tablet 2  . levonorgestrel (MIRENA) 20 MCG/24HR IUD 1 Intra Uterine Device (1 each total) by Intrauterine route once.  1 each 0  . Multiple Vitamins-Iron (MULTI-VITAMIN/IRON PO) Take 1 tablet by mouth daily.     Marland Kitchen omeprazole (PRILOSEC OTC) 20 MG tablet Take 20 mg by mouth daily before breakfast.     .  ondansetron (ZOFRAN ODT) 4 MG disintegrating tablet Take 1 tablet (4 mg total) by mouth every 8 (eight) hours as needed for nausea or vomiting. 20 tablet 0  . pramipexole (MIRAPEX) 0.125 MG tablet TAKE 1 TABLET BY MOUTH DAILY AT BEDTIME. INCREASE WEEKLY AS NEEDED 90 tablet 1  . rizatriptan (MAXALT) 10 MG tablet TAKE 1 TABLET BY MOUTH AS NEEDED FOR MIGRAINE. MAY REPEAT IN 2 HOURS IF NEEDED 10 tablet 2  . topiramate (TOPAMAX) 100 MG tablet Take 1 tablet (100 mg total) by mouth daily. 90 tablet 1  . traMADol (ULTRAM) 50 MG tablet Take 1 tablet (50 mg total) by mouth every 6 (six) hours as needed. 30 tablet 0  . traZODone (DESYREL) 50 MG tablet TAKE 0.5-1 TABLETS (25-50 MG TOTAL) BY MOUTH AT BEDTIME AS NEEDED FOR SLEEP. 90 tablet 2  . Vitamin D, Ergocalciferol, (DRISDOL) 1.25 MG (50000 UNIT) CAPS capsule TAKE 1 CAPSULE BY MOUTH ONE TIME PER WEEK 12 capsule 3   No facility-administered medications prior to visit.    Allergies  Allergen Reactions  . Fish Allergy Nausea And Vomiting  . Augmentin [Amoxicillin-Pot Clavulanate] Diarrhea    Can take regular amoxicillin with no issues Has patient had a PCN reaction causing immediate rash, facial/tongue/throat swelling, SOB or lightheadedness with hypotension: No Has patient had a PCN reaction causing severe rash involving mucus membranes or skin necrosis: No Has patient had a PCN reaction that required hospitalization: No Has patient had a PCN reaction occurring within the last 10 years: Yes--but DIARRHEA ONLY If all of the above answers are "NO", then may proceed with Cephalosporin [place]  . Eggs Or Egg-Derived Products Diarrhea and Nausea And Vomiting  . Sulfonamide Derivatives Itching    Review of Systems Pertinent positives as noted in history of present illness  otherwise negative.   Objective:    Physical Exam Vitals reviewed.  Constitutional:      Appearance: Normal appearance.  HENT:     Head: Normocephalic and atraumatic.  Eyes:     Extraocular Movements: Extraocular movements intact.     Conjunctiva/sclera: Conjunctivae normal.     Pupils: Pupils are equal, round, and reactive to light.  Cardiovascular:     Rate and Rhythm: Normal rate and regular rhythm.     Pulses: Normal pulses.     Heart sounds: Normal heart sounds.  Pulmonary:     Effort: Pulmonary effort is normal.     Breath sounds: Normal breath sounds.  Musculoskeletal:        General: Normal range of motion.     Cervical back: Normal range of motion and neck supple. No rigidity.  Lymphadenopathy:     Cervical: No cervical adenopathy.  Skin:    General: Skin is warm and dry.  Neurological:     General: No focal deficit present.     Mental Status: She is alert and oriented to person, place, and time. Mental status is at baseline.     Cranial Nerves: No cranial nerve deficit.     Sensory: No sensory deficit.     Motor: No weakness.     Coordination: Coordination normal.     Gait: Gait normal.     Deep Tendon Reflexes: Reflexes normal.  Psychiatric:        Mood and Affect: Mood normal.        Behavior: Behavior normal.     BP (!) 102/60 (BP Location: Left Arm, Patient Position: Sitting, Cuff Size: Normal)   Pulse 83   Temp  98.7 F (37.1 C) (Oral)   Ht 5\' 2"  (1.575 m)   Wt 133 lb 12.8 oz (60.7 kg)   SpO2 98%   BMI 24.47 kg/m  Wt Readings from Last 3 Encounters:  04/29/20 133 lb 12.8 oz (60.7 kg)  04/05/20 134 lb 12.8 oz (61.1 kg)  10/18/19 133 lb (60.3 kg)   Orthostatic vital signs: Supine 100/66 with heart rate 70 Standing 94/70 with heart rate 80  Health Maintenance Due  Topic Date Due  . PNEUMOCOCCAL POLYSACCHARIDE VACCINE AGE 68-64 HIGH RISK  Never done  . FOOT EXAM  Never done  . OPHTHALMOLOGY EXAM  Never done  . URINE MICROALBUMIN  06/17/2013    . MAMMOGRAM  04/06/2014  . COLONOSCOPY  02/07/2018  . HEMOGLOBIN A1C  11/06/2018  . PAP SMEAR-Modifier  06/19/2019    There are no preventive care reminders to display for this patient.  Lab Results  Component Value Date   TSH 1.18 09/20/2017   Lab Results  Component Value Date   WBC 6.2 04/05/2020   HGB 13.8 04/05/2020   HCT 40.8 04/05/2020   MCV 96.5 04/05/2020   PLT 263 04/05/2020   Lab Results  Component Value Date   NA 138 04/05/2020   K 3.9 04/05/2020   CO2 25 04/05/2020   GLUCOSE 89 04/05/2020   BUN 12 04/05/2020   CREATININE 0.70 04/05/2020   BILITOT 0.3 04/05/2020   ALKPHOS 76 09/05/2019   AST 22 04/05/2020   ALT 30 (H) 04/05/2020   PROT 6.1 04/05/2020   ALBUMIN 4.3 09/05/2019   CALCIUM 8.9 04/05/2020   ANIONGAP 7 09/05/2019   GFR 69.57 04/05/2019   Lab Results  Component Value Date   CHOL 156 04/05/2019   Lab Results  Component Value Date   HDL 56.00 04/05/2019   Lab Results  Component Value Date   LDLCALC 89 04/05/2019   Lab Results  Component Value Date   TRIG 59.0 04/05/2019   Lab Results  Component Value Date   CHOLHDL 3 04/05/2019   Lab Results  Component Value Date   HGBA1C 5.7 05/06/2018      Assessment & Plan:   Problem List Items Addressed This Visit      Cardiovascular and Mediastinum   Migraine without aura - Primary   Relevant Medications   ketorolac (TORADOL) injection 60 mg (Start on 04/29/2020  4:45 PM)     Meds ordered this encounter  Medications  . ketorolac (TORADOL) injection 60 mg   Intractable migraine- lasting for 6 days :  Toradol 60 mg  shot in the office and go home and take Zofran  Increase your TOPAMAX to 125 mg daily at night for 2 weeks and if recurrent- call back.   Follow-up: No follow-ups on file.  This visit occurred during the SARS-CoV-2 public health emergency.  Safety protocols were in place, including screening questions prior to the visit, additional usage of staff PPE, and extensive  cleaning of exam room while observing appropriate contact time as indicated for disinfecting solutions.    Denice Paradise, NP

## 2020-04-29 NOTE — Patient Instructions (Addendum)
Intractable migraine- lasting for 6 days :  Toradol 60 mg  shot in the office and go home and take Zofran  Increase your TOPAMAX to 125 mg daily at night for 2 weeks and if recurrent- call back.    Recurrent Migraine Headache  Migraines are a type of headache, and they are usually stronger and more sudden than normal headaches (tension headaches). Migraines are characterized by an intense pulsing, throbbing pain that is usually only present on one side of the head. Sometimes, migraine headaches can cause nausea, vomiting, sensitivity to light and sound, and vision changes. Recurrent migraines keep coming back (recurring). A migraine can last from 4 hours up to 3 days. What are the causes? The exact cause of this condition is not known. However, a migraine may be caused when nerves in the brain become irritated and release chemicals that cause inflammation of blood vessels. This inflammation causes pain. Certain things may also trigger migraines, such as:  A disruption in your regular eating and sleeping schedule.  Smoking.  Stress.  Menstruation.  Certain foods and drinks, such as: ? Aged cheese. ? Chocolate. ? Alcohol. ? Caffeine. ? Foods or drinks that contain nitrates, glutamate, aspartame, MSG, or tyramine.  Lack of sleep.  Hunger.  Physical exertion.  Fatigue.  High altitude.  Weather changes.  Medicines, such as: ? Nitroglycerin, which is used to treat chest pain. ? Birth control pills. ? Estrogen. ? Some blood pressure medicines. What are the signs or symptoms? Symptoms of this condition vary for each person and may include:  Pain that is usually only present on one side of the head. In some cases, the pain may be on both sides of the head or around the head or neck.  Pulsating or throbbing pain.  Severe pain that prevents daily activities.  Pain that is aggravated by any physical activity.  Nausea, vomiting, or both.  Dizziness.  Pain with exposure  to bright lights, loud noises, or activity.  General sensitivity to bright lights, loud noises, or smells. Before you get a migraine, you may get warning signs that a migraine is coming (aura). An aura may include:  Seeing flashing lights.  Seeing bright spots, halos, or zigzag lines.  Having tunnel vision or blurred vision.  Having numbness or a tingling feeling.  Having trouble talking.  Having muscle weakness.  Smelling a certain odor. How is this diagnosed? This condition is often diagnosed based on:  Your symptoms and medical history.  A physical exam. You may also have tests, including:  A CT scan or MRI of your brain. These imaging tests cannot diagnose migraines, but they can help to rule out other causes of headaches.  Blood tests. How is this treated? This condition is treated with:  Medicines. These are used for: ? Lessening pain and nausea. ? Preventing recurrent migraines.  Lifestyle changes, such as changes to your diet or sleeping patterns.  Behavior therapy, such as relaxation training or biofeedback. Biofeedback is a treatment that involves teaching you to relax and use your brain to lower your heart rate and control your breathing. Follow these instructions at home: Medicines  Take over-the-counter and prescription medicines only as told by your health care provider.  Do not drive or use heavy machinery while taking prescription pain medicine. Lifestyle  Do not use any products that contain nicotine or tobacco, such as cigarettes and e-cigarettes. If you need help quitting, ask your health care provider.  Limit alcohol intake to no more than  1 drink a day for nonpregnant women and 2 drinks a day for men. One drink equals 12 oz of beer, 5 oz of wine, or 1 oz of hard liquor.  Get 7-9 hours of sleep each night, or the amount of sleep recommended by your health care provider.  Limit your stress. Talk with your health care provider if you need help  with stress management.  Maintain a healthy weight. If you need help losing weight, ask your health care provider.  Exercise regularly. Aim for 150 minutes of moderate-intensity exercise (walking, biking, yoga) or 75 minutes of vigorous exercise (running, circuit training, swimming) each week. General instructions   Keep a journal to find out what triggers your migraine headaches so you can avoid these triggers. For example, write down: ? What you eat and drink. ? How much sleep you get. ? Any change to your diet or medicines.  Lie down in a dark, quiet room when you have a migraine.  Try placing a cool towel over your head when you have a migraine.  Keep lights dim, if bright lights bother you and make your migraines worse.  Keep all follow-up visits as told by your health care provider. This is important. Contact a health care provider if:  Your pain does not improve, even with medicine.  Your migraines continue to return, even with medicine.  You have a fever.  You have weight loss. Get help right away if:  Your migraine becomes severe and medicine does not help.  You have a stiff neck.  You have a loss of vision.  You have muscle weakness or loss of muscle control.  You start losing your balance or have trouble walking.  You feel faint or you pass out.  You develop new, severe symptoms.  You start having abrupt severe headaches that last for a second or less, like a thunderclap. Summary  Migraine headaches are usually stronger and more sudden than normal headaches (tension headaches). Migraines are characterized by an intense pulsing, throbbing pain that is usually only present on one side of the head.  The exact cause of this condition is not known. However, a migraine may be caused when nerves in the brain become irritated and release chemicals that cause inflammation of blood vessels.  Certain things may trigger migraines, such as changes to diet or sleeping  patterns, smoking, certain foods, alcohol, stress, and certain medicines.  Sometimes, migraine headaches can cause nausea, vomiting, sensitivity to light and sound, and vision changes.  Migraines are often diagnosed based on your symptoms, medical history, and a physical exam. This information is not intended to replace advice given to you by your health care provider. Make sure you discuss any questions you have with your health care provider. Document Revised: 09/24/2017 Document Reviewed: 07/03/2016 Elsevier Patient Education  2020 [employer].

## 2020-05-01 ENCOUNTER — Encounter: Payer: Self-pay | Admitting: Nurse Practitioner

## 2020-05-01 ENCOUNTER — Telehealth: Payer: Self-pay | Admitting: Nurse Practitioner

## 2020-05-01 NOTE — Telephone Encounter (Signed)
I called her for symptom update and LMOM to call the office.   PLAN: Please inquire if her HA is gone and how she is doing. Has she needed to increase her Topamax to 125 mg daily? If yes, she will need f/up with Dr. Derrel Nip.

## 2020-05-06 ENCOUNTER — Telehealth: Payer: Self-pay | Admitting: Nurse Practitioner

## 2020-05-06 ENCOUNTER — Encounter: Payer: Self-pay | Admitting: Nurse Practitioner

## 2020-05-06 DIAGNOSIS — G43019 Migraine without aura, intractable, without status migrainosus: Secondary | ICD-10-CM

## 2020-05-06 NOTE — Telephone Encounter (Signed)
Patient sent mychart message with sx update to Dawson Bills, NP

## 2020-05-06 NOTE — Telephone Encounter (Signed)
She is stressing out for school to start next Tues,son going to college, buying a house and sold a house.She is also peri menopausal.    HA is typical- not out of the ordinary- but the frequency went from 2 per month and now x3 per week in May. And now daily HA top or back of head and gets nauseous and hot- this summer is rough. Increasingly frequent HA over the last couple months. Takes Tylenol- can't do NSAID's. She has needed to take her Maxalt x1 today and lay down and is better now.   Plan: 1. Jessica: Please make her an appt with Dr. Derrel Nip for migraine f/up in the next 1-2 weeks.    2. I recommended head MRI. She declines and  wants to check her deductible.  3. I sent in  Neurology referral. She agrees.

## 2020-05-07 NOTE — Telephone Encounter (Signed)
Patient is  scheduled for 05/20/20 at 3:30pm

## 2020-05-07 NOTE — Telephone Encounter (Signed)
See other TE where Maudie Mercury spoke with patient and scheduled f/u with Dr. Derrel Nip

## 2020-05-20 ENCOUNTER — Telehealth (INDEPENDENT_AMBULATORY_CARE_PROVIDER_SITE_OTHER): Payer: BC Managed Care – PPO | Admitting: Internal Medicine

## 2020-05-20 ENCOUNTER — Encounter: Payer: Self-pay | Admitting: Internal Medicine

## 2020-05-20 VITALS — Ht 62.0 in | Wt 129.0 lb

## 2020-05-20 DIAGNOSIS — G43019 Migraine without aura, intractable, without status migrainosus: Secondary | ICD-10-CM | POA: Diagnosis not present

## 2020-05-20 DIAGNOSIS — F419 Anxiety disorder, unspecified: Secondary | ICD-10-CM

## 2020-05-20 DIAGNOSIS — F5105 Insomnia due to other mental disorder: Secondary | ICD-10-CM

## 2020-05-20 DIAGNOSIS — E782 Mixed hyperlipidemia: Secondary | ICD-10-CM

## 2020-05-20 DIAGNOSIS — R0789 Other chest pain: Secondary | ICD-10-CM | POA: Diagnosis not present

## 2020-05-20 DIAGNOSIS — E559 Vitamin D deficiency, unspecified: Secondary | ICD-10-CM | POA: Diagnosis not present

## 2020-05-20 MED ORDER — ONDANSETRON 4 MG PO TBDP: 4.0000 mg | ORAL_TABLET | Freq: Three times a day (TID) | ORAL | 0 refills | Status: DC | PRN

## 2020-05-20 MED ORDER — RIZATRIPTAN BENZOATE 10 MG PO TBDP
10.0000 mg | ORAL_TABLET | ORAL | 0 refills | Status: DC | PRN
Start: 2020-05-20 — End: 2020-12-25

## 2020-05-20 MED ORDER — CITALOPRAM HYDROBROMIDE 40 MG PO TABS
20.0000 mg | ORAL_TABLET | Freq: Every day | ORAL | 0 refills | Status: DC
Start: 2020-05-20 — End: 2020-06-05

## 2020-05-20 NOTE — Progress Notes (Signed)
Virtual Visit via [place]   This visit type was conducted due to national recommendations for restrictions regarding the COVID-19 pandemic (e.g. social distancing).  This format is felt to be most appropriate for this patient at this time.  All issues noted in this document were discussed and addressed.  No physical exam was performed (except for noted visual exam findings with Video Visits).   I connected with@ on 05/20/20 at  3:30 PM EDT by a video enabled telemedicine application  and verified that I am speaking with the correct person using two identifiers. Location patient: home Location provider: work or home office Persons participating in the virtual visit: patient, provider  I discussed the limitations, risks, security and privacy concerns of performing an evaluation and management service by telephone and the availability of in person appointments. I also discussed with the patient that there may be a patient responsible charge related to this service. The patient expressed understanding and agreed to proceed.  Reason for visit: headaches  HPI:  54 yr old female with history of significant weight loss , (bariatric surgery in 2019) with on going weight loss (currently 129 lbs) presents for follow up on recent prolonged headache  Lasting 10 days .  Has increased topomax dos  History of migraines,  Accompanied by vertigo and nausea with vomiting. Had persistent headache pain that lasted 9-10 days total recently. Seen by Dawson Bills in late July .  Received toradol and Had topomax increased for 2 weeks.. headache lasted another 5 days before gradually resolving  Has been having mild headaches since then but not occurring daily. Has increased topamax dose. She notices that she has  nausea during acute migraines when she takes maxalt . She would like a trial of the ODT maxalt.   She attributes her recurrent mild headaches to emotional stress which has increased over the last 4 weeks.  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 Her son also left for college (He is a Paramedic  at [employer]).  She has been working day and night,  And reports an intermittent  recurrence of previously experienced  chest and  shoulder pain.  The pain is transient and not accompanied by nausea, jaw pain or dyspnea.  She is taking trazodone for insomnia (no longer prescribed clonazepam) and rarely uses  valium for muscle relaxers   ROS: See pertinent positives and negatives per HPI.  Past Medical History:  Diagnosis Date  . Anemia, iron deficiency   . Anxiety   . Asthma   . Colon polyps 2014  . Complication of anesthesia    per patient report , woke during endoscopy in 2014 ; no issues with subsequent colonscopy also  in 2014   . Costochondritis    srturggled wiht it since age 77 ; exacerbates with excessive movement of left arm; did phsycial therapy in 08-2017 and hasnt had issues with it since   . Depression   . Eating disorder    Binge eating  . Fibromyalgia   . Gallstones   . GERD (gastroesophageal reflux disease)   . Heart murmur    at birth; resolved   . Hyperlipidemia   . Hypertension    was due to birth control  , " as soon as they took it out, it went away  "     Past Surgical History:  Procedure  Laterality Date  . CHOLECYSTECTOMY    . COLONOSCOPY     [facility] dr Deatra Ina   . EVALUATION UNDER ANESTHESIA WITH HEMORRHOIDECTOMY N/A 06/24/2018   Procedure: EXAM UNDER ANESTHESIA WITH EXTERNAL HEMORRHOIDECTOMY;  Surgeon: Johnathan Hausen, MD;  Location: [facility];  Service: General;  Laterality: N/A;  . 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 GANGLION CYST EXCISION    . GASTRIC ROUX-EN-Y N/A 01/24/2018   Procedure: LAPAROSCOPIC ROUX-EN-Y GASTRIC BYPASS WITH UPPER ENDOSCOPY;  Surgeon: Johnathan Hausen, MD;  Location: [facility];  Service: General;  Laterality: N/A;  .  SPHINCTEROTOMY N/A 06/24/2018   Procedure: LATERAL INTERNAL SPHINCTEROTOMY;  Surgeon: Johnathan Hausen, MD;  Location: [facility];  Service: General;  Laterality: N/A;  . TONSILLECTOMY  1989    Family History  Problem Relation Age of Onset  . Hyperlipidemia Mother   . Heart disease Mother        Atrial fibrilation  . Cancer Mother        ? Melanoma  . Atrial fibrillation Mother   . Diabetes Father   . Mental illness Father        Bipolar  . Leukemia Father 22       MDS< then leukemia   . Cancer Sister        Clear cell sarcoma in leg  . Hypothyroidism Sister   . Hypertension Maternal Grandmother   . Heart disease Maternal Grandmother        Afib  . Arrhythmia Maternal Grandmother   . Arthritis Paternal Grandmother   . Arrhythmia Maternal Grandfather   . Colon cancer Neg Hx   . Rectal cancer Neg Hx   . Pancreatic cancer Neg Hx     SOCIAL HX: divorced,  Tourist information centre manager.  One son aged 17 now in college at [employer].  reports that she has never smoked. She has never used smokeless tobacco. She reports current alcohol use. She reports that she does not use drugs.   Current Outpatient Medications:  .  buPROPion (WELLBUTRIN) 100 MG tablet, Take 1 tablet (100 mg total) by mouth daily., Disp: 90 tablet, Rfl: 1 .  calcium gluconate 500 MG tablet, Take 1 tablet by mouth 2 (two) times daily. , Disp: , Rfl:  .  diazepam (VALIUM) 10 MG tablet, TAKE 1 TABLET BY MOUTH DAILY AS NEEDED FOR ANXIETY/MUSCLE SPASMS, Disp: 30 tablet, Rfl: 0 .  levonorgestrel (MIRENA) 20 MCG/24HR IUD, 1 Intra Uterine Device (1 each total) by Intrauterine route once., Disp: 1 each, Rfl: 0 .  Multiple Vitamins-Iron (MULTI-VITAMIN/IRON PO), Take 1 tablet by mouth daily. , Disp: , Rfl:  .  omeprazole (PRILOSEC OTC) 20 MG tablet, Take 20 mg by mouth daily before breakfast. , Disp: , Rfl:  .  ondansetron (ZOFRAN ODT) 4 MG disintegrating tablet, Take 1 tablet (4 mg total) by mouth every 8 (eight) hours as needed for nausea or vomiting.,  Disp: 20 tablet, Rfl: 0 .  pramipexole (MIRAPEX) 0.125 MG tablet, TAKE 1 TABLET BY MOUTH DAILY AT BEDTIME. INCREASE WEEKLY AS NEEDED, Disp: 90 tablet, Rfl: 1 .  topiramate (TOPAMAX) 100 MG tablet, Take 1 tablet (100 mg total) by mouth daily., Disp: 90 tablet, Rfl: 1 .  traMADol (ULTRAM) 50 MG tablet, Take 1 tablet (50 mg total) by mouth every 6 (six) hours as needed., Disp: 30 tablet, Rfl: 0 .  traZODone (DESYREL) 50 MG tablet, TAKE 0.5-1 TABLETS (25-50 MG TOTAL) BY MOUTH AT BEDTIME AS NEEDED FOR SLEEP., Disp: 90 tablet, Rfl:  2 .  Vitamin D, Ergocalciferol, (DRISDOL) 1.25 MG (50000 UNIT) CAPS capsule, TAKE 1 CAPSULE BY MOUTH ONE TIME PER WEEK, Disp: 12 capsule, Rfl: 3 .  citalopram (CELEXA) 40 MG tablet, Take 0.5 tablets (20 mg total) by mouth daily., Disp: 30 tablet, Rfl: 0 .  dicyclomine (BENTYL) 10 MG capsule, Take 1 capsule (10 mg total) by mouth 4 (four) times daily -  before meals and at bedtime. (Patient not taking: Reported on 05/20/2020), Disp: 120 capsule, Rfl: 2 .  rizatriptan (MAXALT-MLT) 10 MG disintegrating tablet, Take 1 tablet (10 mg total) by mouth as needed for migraine. May repeat in 2 hours if needed, Disp: 10 tablet, Rfl: 0  EXAM:  VITALS per patient if applicable:  GENERAL: alert, oriented, appears well and in no acute distress  HEENT: atraumatic, conjunttiva clear, no obvious abnormalities on inspection of external nose and ears  NECK: normal movements of the head and neck  LUNGS: on inspection no signs of respiratory distress, breathing rate appears normal, no obvious gross SOB, gasping or wheezing  CV: no obvious cyanosis  MS: moves all visible extremities without noticeable abnormality  PSYCH/NEURO: pleasant and cooperative, no obvious depression or anxiety, speech and thought processing grossly intact  ASSESSMENT AND PLAN:  Discussed the following assessment and plan:  Intractable migraine without aura and without status migrainosus  Atypical chest  pain  Insomnia secondary to anxiety  Atypical chest pain Chronic intermittent, with prior noninvasive cardiac workup in 2018 which was negative for cardiac causes.  Has been diagnosed with costochondritis in the past. Current symptoms have been intermittently Occurring during stressful time, not associated with exertion or other anginal symptoms.  Reassurance provided. Has seen Dr Rockey Situ in the past (2019), if symptoms become more bothersome will refer to him for new risk stratification. LDL < 100 last July,  Repeat lipids needed   Lab Results  Component Value Date   CHOL 156 04/05/2019   HDL 56.00 04/05/2019   LDLCALC 89 04/05/2019   LDLDIRECT 171 (H) 05/21/2016   TRIG 59.0 04/05/2019   CHOLHDL 3 04/05/2019     Insomnia secondary to anxiety Medication regimen changed at prior visit to trazodone to decrease benzodiazepine use . Clonazepam discontinued  Migraine without aura Recent migraine (?) lasted ten days before resolving.  topomax dose recently increased to 100 mg daily.  Changing Maxalt to ODT and sending Zofran ODT as well . BP and pulse too low to start beta blocker.   If prolonged headaches become recurrent,  Given her history of pituitary tumor may need repeat imaging to rule out mass occupying progression    I discussed the assessment and treatment plan with the patient. The patient was provided an opportunity to ask questions and all were answered. The patient agreed with the plan and demonstrated an understanding of the instructions.   The patient was advised to call back or seek an in-person evaluation if the symptoms worsen or if the condition fails to improve as anticipated.  I provided 30 minutes of non-face-to-face time during this encounter reviewing prior evaluations, workups,  And imaging studies    [name], MD

## 2020-05-21 NOTE — Assessment & Plan Note (Signed)
Medication regimen changed at prior visit to trazodone to decrease benzodiazepine use . Clonazepam discontinued

## 2020-05-21 NOTE — Assessment & Plan Note (Addendum)
Recent migraine (?) lasted ten days before resolving.  topomax dose recently increased to 100 mg daily.  Changing Maxalt to ODT and sending Zofran ODT as well . BP and pulse too low to start beta blocker.   If prolonged headaches become recurrent,  Given her history of pituitary tumor may need repeat imaging to rule out mass occupying progression

## 2020-05-21 NOTE — Assessment & Plan Note (Addendum)
Chronic intermittent, with prior noninvasive cardiac workup in 2018 which was negative for cardiac causes.  Has been diagnosed with costochondritis in the past. Current symptoms have been intermittently Occurring during stressful time, not associated with exertion or other anginal symptoms.  Reassurance provided. Has seen Dr Rockey Situ in the past (2019), if symptoms become more bothersome will refer to him for new risk stratification. LDL < 100 last July,  Repeat lipids needed   Lab Results  Component Value Date   CHOL 156 04/05/2019   HDL 56.00 04/05/2019   LDLCALC 89 04/05/2019   LDLDIRECT 171 (H) 05/21/2016   TRIG 59.0 04/05/2019   CHOLHDL 3 04/05/2019

## 2020-06-03 ENCOUNTER — Other Ambulatory Visit: Payer: Self-pay | Admitting: Internal Medicine

## 2020-06-05 ENCOUNTER — Other Ambulatory Visit: Payer: Self-pay | Admitting: Internal Medicine

## 2020-06-05 ENCOUNTER — Encounter: Payer: Self-pay | Admitting: Internal Medicine

## 2020-06-05 MED ORDER — FLUOXETINE HCL 20 MG PO TABS: 60.0000 mg | ORAL_TABLET | Freq: Every day | ORAL | 2 refills | Status: DC

## 2020-06-06 ENCOUNTER — Telehealth: Payer: Self-pay | Admitting: Internal Medicine
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
## 2020-06-06 NOTE — Telephone Encounter (Signed)
Letter written and sent to patient.

## 2020-06-07 MED ORDER — CLONAZEPAM 0.5 MG PO TABS: 0.5000 mg | ORAL_TABLET | Freq: Two times a day (BID) | ORAL | 0 refills | Status: DC | PRN

## 2020-06-07 NOTE — Telephone Encounter (Signed)
FYI.  Discussed with Juliann Pulse.  She was informed (by Juliann Pulse) to be seen given increased anxiety.  She denies any suicidal ideations.  Agree with plan.

## 2020-06-07 NOTE — Telephone Encounter (Signed)
Pt said that she was told that someone would call her today and get her in with Dr. Derrel Nip

## 2020-06-07 NOTE — Telephone Encounter (Signed)
clonazepam refilled for prn  Use for 30 days

## 2020-06-07 NOTE — Telephone Encounter (Signed)
From what I understood was pt was advised to be seen at [facility] and was given the information to call behavioral health and the information for [facility]. Was pt supposed to be worked in today?

## 2020-06-07 NOTE — Telephone Encounter (Signed)
No, that was not promised , because I have no openings.  If she needs me to refill her clonazepam until she can have an appt scheduled I will do that

## 2020-06-07 NOTE — Telephone Encounter (Signed)
Spoke with pt and she has been scheduled for first available appt on 06/12/2020. Pt would like to have the clonazepam refilled until then.

## 2020-06-08 ENCOUNTER — Telehealth: Payer: BC Managed Care – PPO | Admitting: Family

## 2020-06-08 DIAGNOSIS — S61210A Laceration without foreign body of right index finger without damage to nail, initial encounter: Secondary | ICD-10-CM

## 2020-06-08 MED ORDER — CLINDAMYCIN HCL 300 MG PO CAPS: 300.0000 mg | ORAL_CAPSULE | Freq: Three times a day (TID) | ORAL | 0 refills | Status: DC

## 2020-06-08 NOTE — Progress Notes (Signed)
E visit for Simple Cut/Laceration  We are sorry that you have had an injury. Here is how we plan to help!  I have prescribed Clindamycin three times a day to prevent infection  HOME CARE: . Clean the cut or scrape - Wash it well with soap and water. * avoid using hydrogen peroxide which may cause tissue damage, or impede wound healing.  . Stop the bleeding - If your cut or scrape is bleeding, press a clean cloth or bandage firmly on the area for 20 minutes. You can also help slow the bleeding by holding the cut above the level of your heart.   . Put a thin layer of Bacitracin antibiotic ointment on the cut or scrape. (this can be purchased at any local pharmacy- ask your pharmacist if you need assistance)   . Cover the cut or scrape with a bandage or gauze. Keep the bandage clean and dry. Change the bandage 1 to 2 times every day until your cut or scrape heals.   . Watch for signs that your cut or scrape is infected (redness, drainage, pain, warmth, swelling or fever)  Over the next 48 hours your wound should start to improve with less pain, less swelling and less redness. If you should develop increasing pain, swelling, redness, fever, pus from the wound you should be seen immediately to make sure this is not becoming infected.   WOUND CARE: Please keep a layer of antibiotic ointment (bacitracin preferred) on this wound at least twice a day for the next seven days and keep a sterile dressing over top of it. You may gently clean the wound with warm soap and water between dressing changes.  We strongly recommend that you have a medical provider reevaluate your wound within 2 to 3 days in person to make sure that it is healing appropriately.  Thank you for choosing an e-visit. Your e-visit answers were reviewed by a board certified advanced clinical practitioner to complete your personal care plan. Depending upon the condition, your plan could have included both over the counter or  prescription medications. Please review your pharmacy choice. Make sure the pharmacy is open so you can pick up prescription now. If there is a problem, you may contact your provider through [employer] and have the prescription routed to another pharmacy. Your safety is important to [place]. If you have drug allergies check your prescription carefully.  For the next 24 hours you can use MyChart to ask questions about today's visit, request a non-urgent call back, or ask for a work or school excuse.  You will get an email with a link to our survey asking about your experience. I hope that your e-visit has been valuable and will speed your recovery        Greater than 5 minutes, yet less than 10 minutes of time have been spent researching, coordinating, and implementing care for this patient today.  Thank you for the details you included in the comment boxes. Those details are very helpful in determining the best course of treatment for you and help [place] to provide the best care.

## 2020-06-12 ENCOUNTER — Encounter: Payer: Self-pay | Admitting: Internal Medicine

## 2020-06-12 ENCOUNTER — Telehealth (INDEPENDENT_AMBULATORY_CARE_PROVIDER_SITE_OTHER): Payer: BC Managed Care – PPO | Admitting: Internal Medicine

## 2020-06-12 DIAGNOSIS — F411 Generalized anxiety disorder: Secondary | ICD-10-CM | POA: Diagnosis not present

## 2020-06-12 DIAGNOSIS — F419 Anxiety disorder, unspecified: Secondary | ICD-10-CM | POA: Diagnosis not present

## 2020-06-12 DIAGNOSIS — G2581 Restless legs syndrome: Secondary | ICD-10-CM | POA: Diagnosis not present

## 2020-06-12 DIAGNOSIS — F329 Major depressive disorder, single episode, unspecified: Secondary | ICD-10-CM | POA: Diagnosis not present

## 2020-06-12 DIAGNOSIS — F5105 Insomnia due to other mental disorder: Secondary | ICD-10-CM

## 2020-06-12 DIAGNOSIS — F32A Depression, unspecified: Secondary | ICD-10-CM
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
## 2020-06-15 MED ORDER — BUPROPION HCL 100 MG PO TABS
100.0000 mg | ORAL_TABLET | Freq: Every day | ORAL | 1 refills | Status: DC
Start: 2020-06-15 — End: 2021-02-28

## 2020-06-15 NOTE — Assessment & Plan Note (Addendum)
Trial of paxil instead of prozac with chronic use of  wellbutrin was not tolerated.  She has resumed prozac 60 mg daily and paxil was stopped

## 2020-06-15 NOTE — Assessment & Plan Note (Signed)
Continue wellbutrin and prozac

## 2020-06-15 NOTE — Assessment & Plan Note (Signed)
Controlled with mirapex .  No changes today

## 2020-06-15 NOTE — Assessment & Plan Note (Signed)
Medication regimen changed at prior visit to trazodone to decrease benzodiazepine use . Clonazepam had been  Discontinued but due to current events at work she has had to resume prn use of clonazepam The risks and benefits of benzodiazepine use were discussed with patient today including excessive sedation leading to respiratory depression,  impaired thinking/driving, and addiction.  Patient was advised to avoid concurrent use with alcohol, to use medication only as needed and not to share with others  .

## 2020-06-19 MED ORDER — FLUCONAZOLE 150 MG PO TABS: 150.0000 mg | ORAL_TABLET | Freq: Every day | ORAL | 0 refills | Status: DC

## 2020-07-01 ENCOUNTER — Encounter: Payer: Self-pay | Admitting: Internal Medicine

## 2020-07-01 ENCOUNTER — Telehealth (INDEPENDENT_AMBULATORY_CARE_PROVIDER_SITE_OTHER): Payer: BC Managed Care – PPO | Admitting: Internal Medicine

## 2020-07-01 DIAGNOSIS — U071 COVID-19: Secondary | ICD-10-CM

## 2020-07-01 MED ORDER — BENZONATATE 200 MG PO CAPS: 200.0000 mg | ORAL_CAPSULE | Freq: Three times a day (TID) | ORAL | 1 refills | Status: DC | PRN

## 2020-07-01 NOTE — Patient Instructions (Signed)
Check your pulse ox readings with exertion.  If they drop to 90% or below and do not improve with rest, go to the nearest ER   Use guaifenesin to thin out thick mucus  Use benadryl 12.5 mg ot 25 mg to dry up nose  Use dextromethorphan to quiet down the cough  The benzonatate (tessalon) capsules are stronger than dextromethorphan and will quiet the cough  A collective team  of  pulmonary and critical care specialists at [facility] have worked to open an outpatient infusion center to provide the approved monoclonal antibody therapy medications to COVID-19 positive community members who are at risk for severe complications    I have sent your name in as a referral. They are overrun with requests currently,  But if they call and offer you the infusion, I recommend you take it.     You should continue your self imposed quantarine until you can answer "yes" to ALL 3 of the conditions below:   1) Your symptoms (fever, cough, shortness of breath) started 7 or more days ago   2) Your body temperature  has been normal for at least 72 hours (WITHOUT the use of any tylenol, motrin or aleve) . Normal is < 100.4 Farenheit  3) your other flu  like symptoms symptoms are getting better.   YOUR other  FAMILY members , even if they feel fine , will need to continue to quarantining themselves  (that means no contact with ANYONE outside of the house)  for 10 days STARTING from the end of your initial 7 day period . (why? because they have been theoretically exposed to you during the entire 7 days of your illness, and they can shed the virus without symptoms for that period of time ).  However if they test negative on day 7 of your illness,  They are ok to break quarantine    Regards,   Deborra Medina, MD

## 2020-07-01 NOTE — Telephone Encounter (Signed)
Spoke with pt and she is scheduled for a virtual visit with Dr. Derrel Nip today.
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
## 2020-07-01 NOTE — Assessment & Plan Note (Signed)
By home test ,  Symptomatic .  Symptoms are improving with rest,  Use zofran and adding benzonotate for cough.  Can break quarantine on Friday Oct 2 if afebrile x 3 days

## 2020-07-02 IMAGING — MR MR ABDOMEN WO/W CM MRCP
17 of 19 series · 40 of 48 positions shown · IV contrast (12 ML MULTIHANCE)
Comparison: CT abdomen/pelvis dated 07/25/2019

CLINICAL DATA: Liver lesion on CT

EXAM:
MRI ABDOMEN WITHOUT AND WITH CONTRAST (INCLUDING MRCP)
TECHNIQUE: Multiplanar multisequence MR imaging of the abdomen was performed
both before and after the administration of intravenous contrast.
Heavily T2-weighted images of the biliary and pancreatic ducts were
obtained, and three-dimensional MRCP images were rendered by post
processing.
CONTRAST:  12mL MULTIHANCE GADOBENATE DIMEGLUMINE 529 MG/ML IV SOLN

[Series 9: T2 · coronal · 5.0mm · 1.56mm/px · 1 of 36 slices shown (1 of 4)]
[im 1/36]
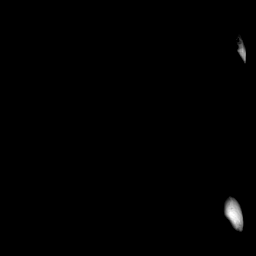

[Series 10: T1 · axial · 3.0mm · 1.12mm/px · z∈[-87,+126]mm · 4 of 144 slices shown]
[im 1/144]
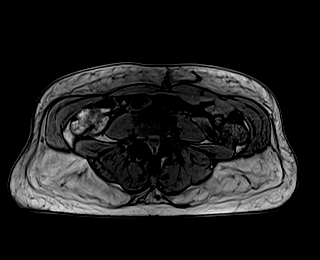
[im 48/144]
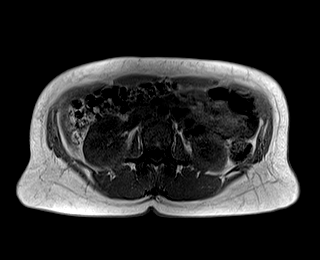
[im 96/144]
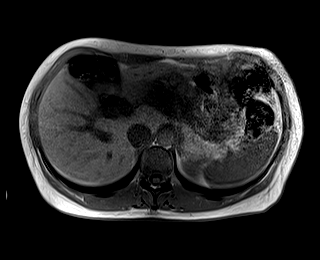
[im 144/144]
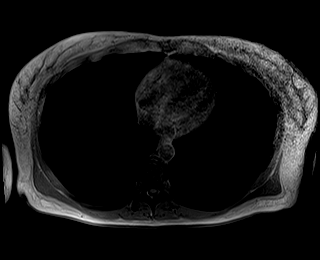

[Series 13: T2 · axial · 5.0mm · 1.33mm/px · z∈[-100,+146]mm · 2 of 42 slices shown (2 of 4)]
[im 1/42]
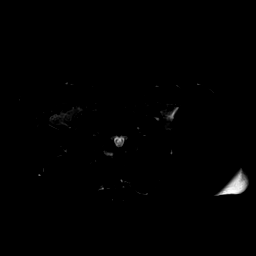
[im 42/42]
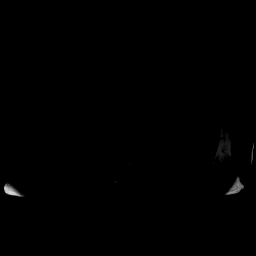

[Series 14: MRCP · coronal · 1.0mm · 0.49mm/px · 3 of 72 slices shown]
[im 1/72]
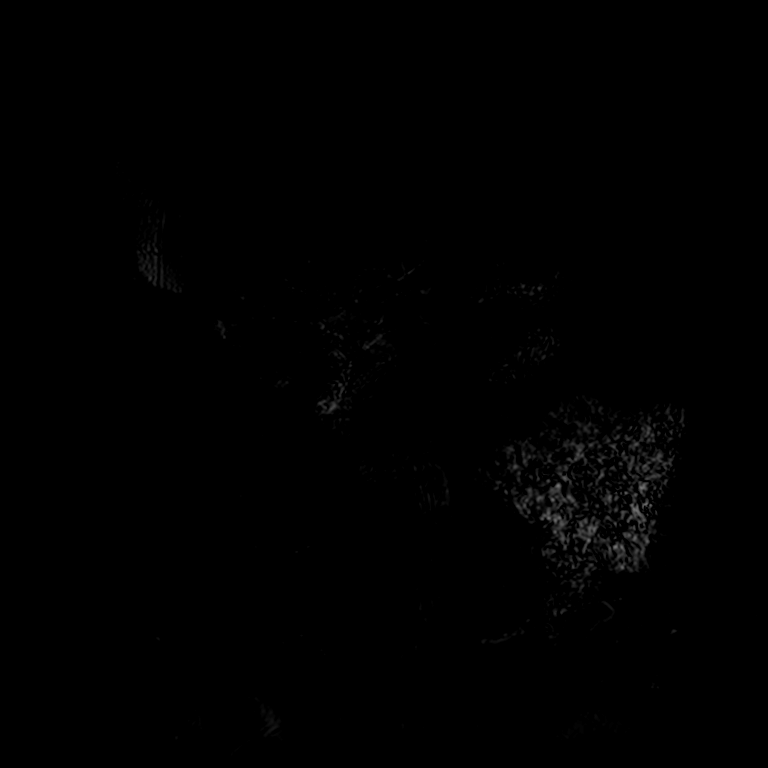
[im 36/72]
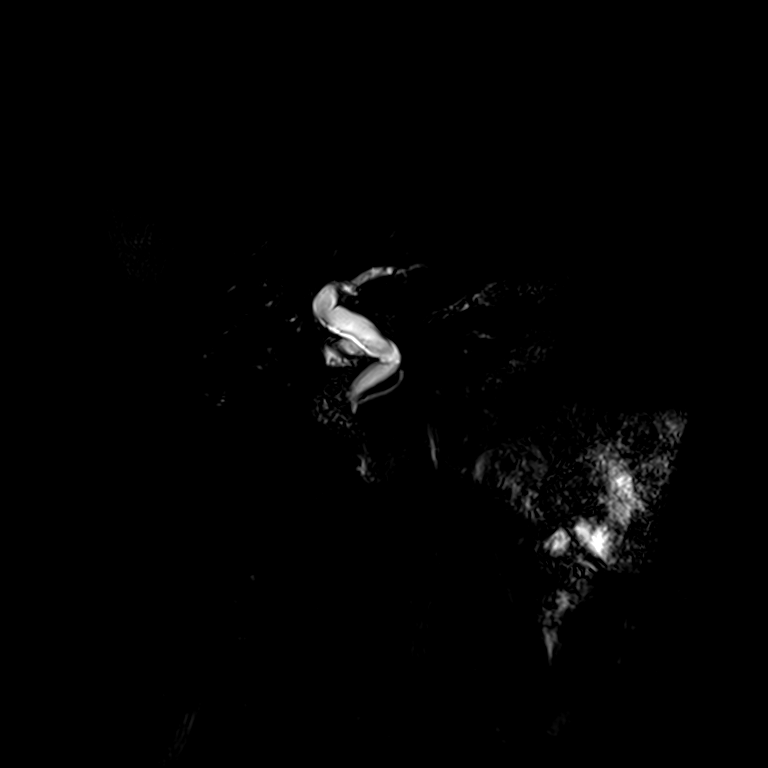
[im 72/72]
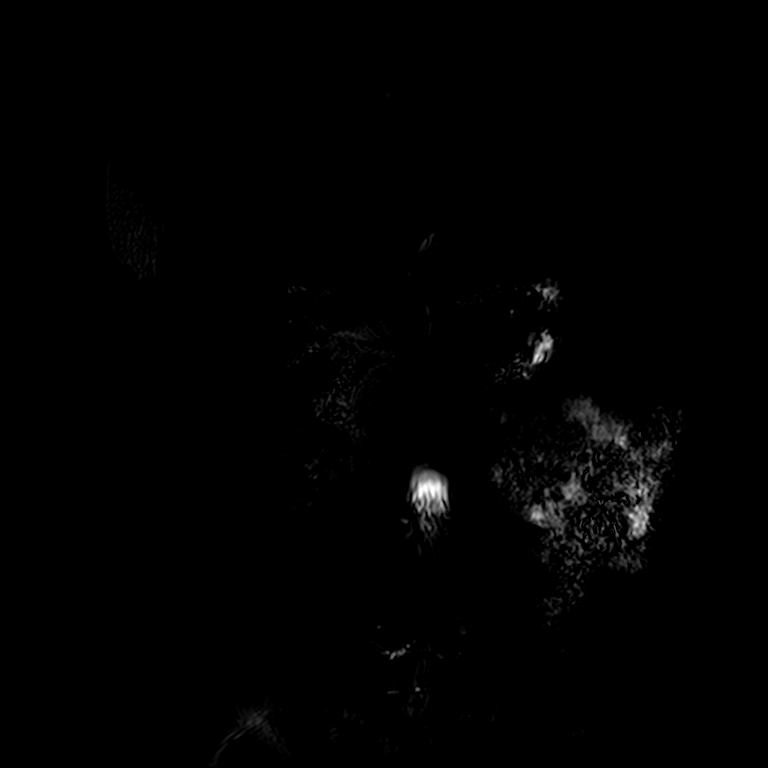

[Series 17: DWI · axial · 5.0mm · 1.38mm/px · z∈[-94,+140]mm · 4 of 120 slices shown (1 of 2)]
[im 1/120]
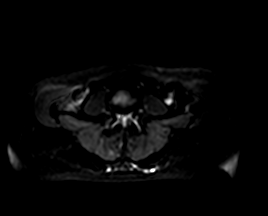
[im 40/120]
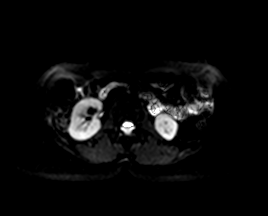
[im 80/120]
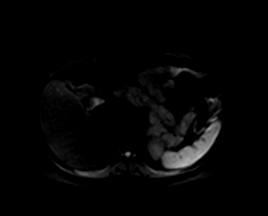
[im 120/120]
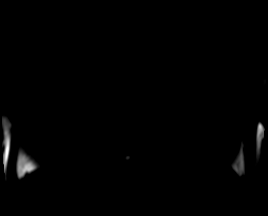

[Series 18: DWI · axial · 5.0mm · 1.38mm/px · 1 of 40 slices shown (2 of 2)]
[im 1/40]
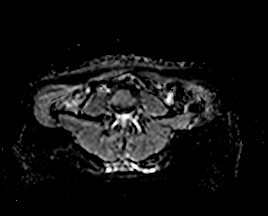

[Series 19: T2 · coronal · 3.0mm · 1.19mm/px · 1 of 21 slices shown (3 of 4)]
[im 1/21]
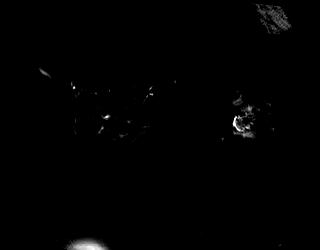

[Series 20: T2 · axial · 6.0mm · 1.22mm/px · 1 of 30 slices shown (4 of 4)]
[im 1/30]
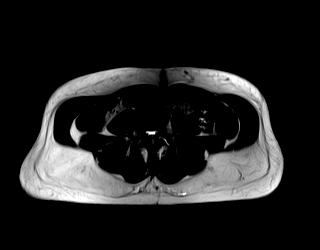

[Series 21: bSSFP · axial · 5.0mm · 1.18mm/px · 1 of 38 slices shown]
[im 1/38]
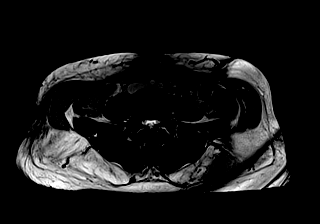

[Series 22: T1 dynamic · axial · non-contrast · 3.0mm · 1.25mm/px · z∈[-105,+132]mm · 3 of 80 slices shown]
[im 1/80]
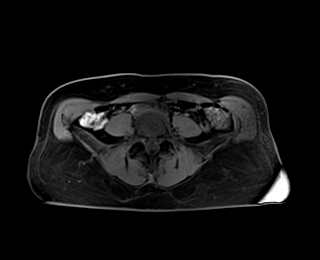
[im 40/80]
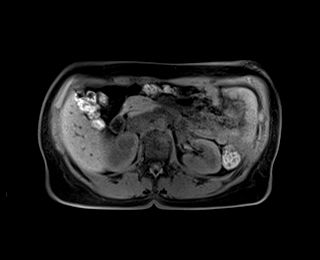
[im 80/80]
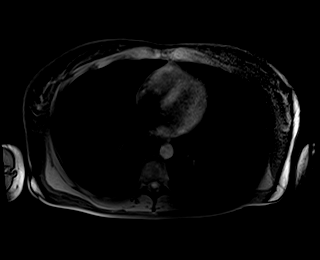

[Series 23: T1 dynamic post-contrast · axial · 3.0mm · 1.25mm/px · z∈[-105,+132]mm · 3 of 80 slices shown (1 of 7)]
[im 1/80]
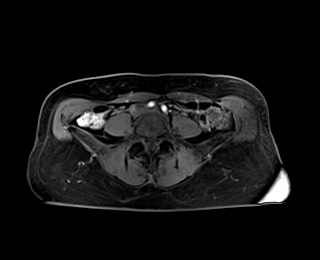
[im 40/80]
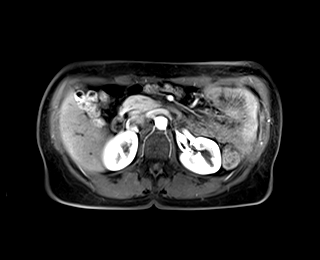
[im 80/80]
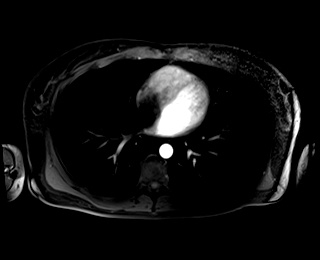

[Series 24: T1 dynamic post-contrast · axial · 3.0mm · 1.25mm/px · z∈[-105,+132]mm · 3 of 80 slices shown (2 of 7)]
[im 1/80]
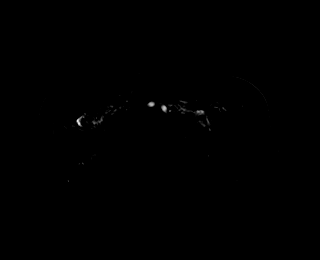
[im 40/80]
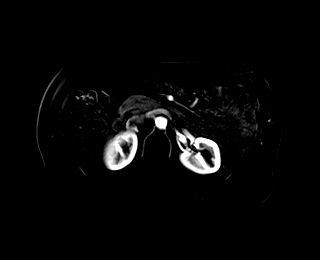
[im 80/80]
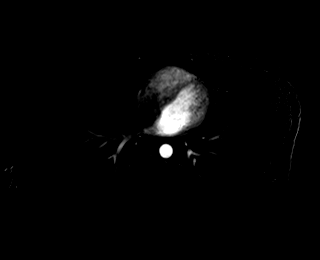

[Series 25: T1 dynamic post-contrast · axial · 3.0mm · 1.25mm/px · z∈[-105,+132]mm · 3 of 80 slices shown (3 of 7)]
[im 1/80]
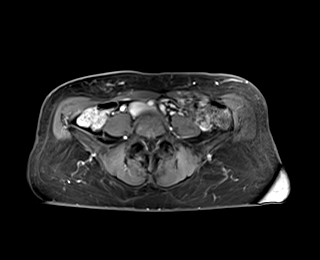
[im 40/80]
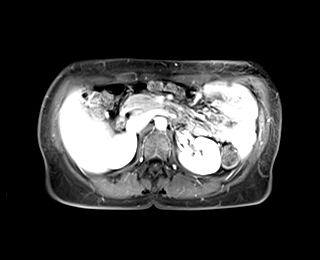
[im 80/80]
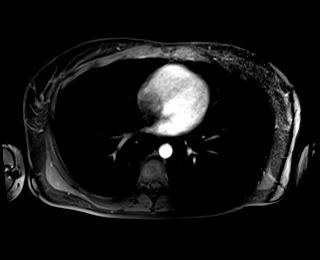

[Series 26: T1 dynamic post-contrast · axial · 3.0mm · 1.25mm/px · z∈[-105,+132]mm · 3 of 79 slices shown (4 of 7)]
[im 1/79]
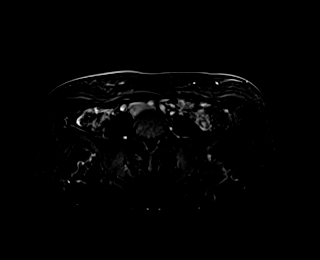
[im 40/79]
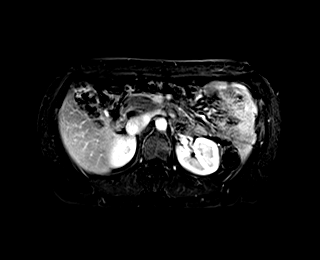
[im 79/79]
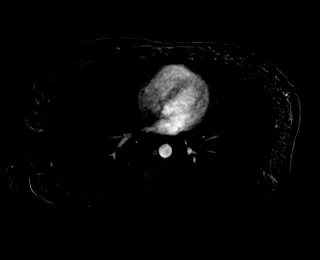

[Series 27: T1 dynamic post-contrast · axial · 3.0mm · 1.25mm/px · z∈[-105,+132]mm · 3 of 80 slices shown (5 of 7)]
[im 1/80]
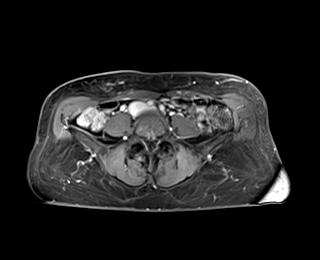
[im 40/80]
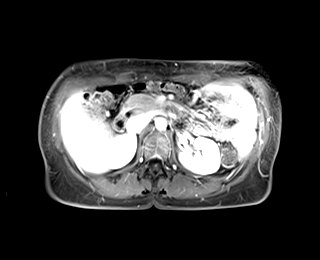
[im 80/80]
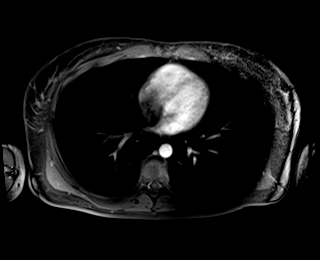

[Series 28: T1 dynamic post-contrast · axial · 3.0mm · 1.25mm/px · z∈[-105,+132]mm · 3 of 80 slices shown (6 of 7)]
[im 1/80]
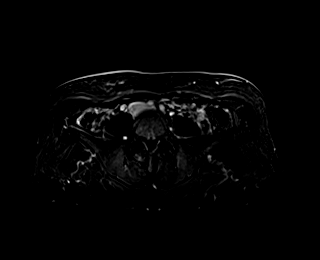
[im 40/80]
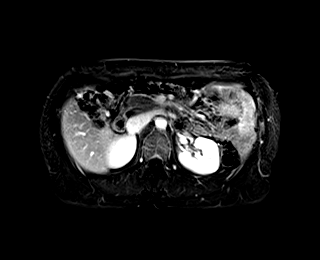
[im 80/80]
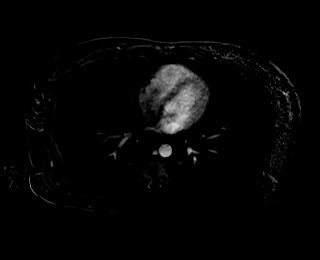

[Series 29: T1 dynamic post-contrast · coronal · 3.0mm · 1.25mm/px · 1 of 72 slices shown (7 of 7)]
[im 1/72]
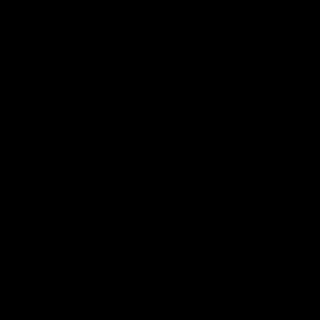

[40 of 48 positions shown; findings below may reference images not displayed]

FINDINGS: Lower chest: Lung bases are clear.

Hepatobiliary: No morphologic findings of cirrhosis. No hepatic
steatosis. 12 mm fat density lesion overlying the anterior right
hepatic dome (series 13/image 6), nonenhancing, reflecting a hepatic
lipoma versus pseudolipoma of Musinschii capsule, benign. No
suspicious hepatic lesions.

Status post cholecystectomy. No intrahepatic ductal dilatation.
Common duct is mildly prominent centrally, measuring 13 mm (series
9/image 25), but smoothly tapering at the ampulla, likely
postsurgical. No choledocholithiasis is seen.

Pancreas:  Within normal limits.

Spleen:  Within normal limits.

Adrenals/Urinary Tract:  Adrenal glands are within normal limits.

Kidneys are within normal limits.  No hydronephrosis.

Stomach/Bowel: Stomach is notable for postsurgical changes related
to gastric bypass.

No evidence of bowel obstruction.

Visualized bowel is grossly unremarkable.

Vascular/Lymphatic:  No evidence of abdominal aortic aneurysm.

No suspicious abdominal lymphadenopathy.

Other:  No abdominal ascites.

Musculoskeletal: No focal osseous lesions.
IMPRESSION: 12 mm fat density lesion overlying the anterior right hepatic dome,
compatible with a hepatic lipoma versus pseudolipoma of Musinschii
capsule, benign. No follow-up imaging is required.

## 2020-07-11 ENCOUNTER — Telehealth: Payer: Self-pay | Admitting: Internal Medicine

## 2020-07-11 NOTE — Telephone Encounter (Signed)
Rejection Reason - Patient Declined - pt cancelled pt-no longer needed, migraines are better" [facility] said on Jul 11, 2020 7:08 AM

## 2020-07-14 ENCOUNTER — Other Ambulatory Visit: Payer: Self-pay | Admitting: Internal Medicine

## 2020-07-14 DIAGNOSIS — G43019 Migraine without aura, intractable, without status migrainosus: Secondary | ICD-10-CM

## 2020-07-16 ENCOUNTER — Institutional Professional Consult (permissible substitution): Payer: BC Managed Care – PPO | Admitting: Neurology

## 2020-09-03 ENCOUNTER — Encounter: Payer: Self-pay | Admitting: Internal Medicine

## 2020-09-03 ENCOUNTER — Telehealth (INDEPENDENT_AMBULATORY_CARE_PROVIDER_SITE_OTHER): Payer: BC Managed Care – PPO | Admitting: Internal Medicine

## 2020-09-03 DIAGNOSIS — F411 Generalized anxiety disorder: Secondary | ICD-10-CM | POA: Diagnosis not present

## 2020-09-03 DIAGNOSIS — F32A Depression, unspecified: Secondary | ICD-10-CM

## 2020-09-03 DIAGNOSIS — F329 Major depressive disorder, single episode, unspecified: Secondary | ICD-10-CM

## 2020-09-03 MED ORDER — CLONAZEPAM 0.5 MG PO TABS: 0.5000 mg | ORAL_TABLET | Freq: Every day | ORAL | 5 refills | Status: DC | PRN

## 2020-09-03 NOTE — Assessment & Plan Note (Signed)
Continue wellbutrin and prozac .  Her recent weight loss has resolved.

## 2020-09-03 NOTE — Assessment & Plan Note (Signed)
She has resumed prozac 60 mg daily  And is using clonazepam once daily  In the morning to manage the anxiety due to fear of work conflict and aggression..  encouraged to wean the clonazepam as soon as possible.
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
## 2020-10-10 ENCOUNTER — Other Ambulatory Visit: Payer: Self-pay | Admitting: Internal Medicine

## 2020-10-10 DIAGNOSIS — G43019 Migraine without aura, intractable, without status migrainosus: Secondary | ICD-10-CM

## 2020-12-25 ENCOUNTER — Encounter: Payer: Self-pay | Admitting: Internal Medicine

## 2020-12-25 ENCOUNTER — Telehealth (INDEPENDENT_AMBULATORY_CARE_PROVIDER_SITE_OTHER): Payer: BC Managed Care – PPO | Admitting: Internal Medicine

## 2020-12-25 DIAGNOSIS — G4452 New daily persistent headache (NDPH): Secondary | ICD-10-CM | POA: Insufficient documentation

## 2020-12-25 MED ORDER — TRAMADOL HCL 50 MG PO TABS: 50.0000 mg | ORAL_TABLET | Freq: Four times a day (QID) | ORAL | 0 refills | Status: AC | PRN

## 2020-12-25 MED ORDER — PREDNISONE 10 MG PO TABS: ORAL_TABLET | ORAL | 0 refills | Status: DC

## 2020-12-25 NOTE — Assessment & Plan Note (Signed)
Etiology unclear  ddx includes growth of pituitary tumor noted in 2016 found during workup for galactorrhea, as well as progression of cervical disk disease.  Trial of prednisone taper and tramadol , repeat MRI brain needed .  Follow up in office in 2 weeks for neurologic exam

## 2020-12-25 NOTE — Progress Notes (Signed)
Virtual Visit via  [place]   This visit type was conducted due to national recommendations for restrictions regarding the COVID-19 pandemic (e.g. social distancing).  This format is felt to be most appropriate for this patient at this time.  All issues noted in this document were discussed and addressed.  No physical exam was performed (except for noted visual exam findings with Video Visits).   I connected with@ on 12/25/20 at  4:30 PM EDT by a video enabled telemedicine application  and verified that I am speaking with the correct person using two identifiers. Location patient: home Location provider: work or home office Persons participating in the virtual visit: patient, provider  I discussed the limitations, risks, security and privacy concerns of performing an evaluation and management service by telephone and the availability of in person appointments. I also discussed with the patient that there may be a patient responsible charge related to this service. The patient expressed understanding and agreed to proceed.  Reason for visit: persistent headache   HPI:  55 yr old female with history of migraine headaches, Managed with  prophylactics with 100 mg topamax , pituitary adenoma, and cervical disk disease presents with recurrent daily headache for the past 4 weeks .  The headaches begin about 30 minutes after she gets to work , around 7 am  And do not improve with tylenol or rizatriptan , and are still present at 2:30 when she leaves work.  The pain is not severe.  It is bilateral in location either affecting the forehead or the occiput.  Not throbbing or stabbing.  Not like her previous migraines.  Has noticed some blurring of vision over the last 3 months both with far and close vision .  Denies double vision,  BLIND SPOTS . She recently had a GI illness and lost 7 lbs, which she has not regained despite restoration of appetite.  Weight is 121 lbs at home . Her gastric bypass surgery was  in April 2019.  Last MRI of brain was in 2016.     ROS: See pertinent positives and negatives per HPI.  Past Medical History:  Diagnosis Date  . Anemia, iron deficiency   . Anxiety   . Asthma   . Colon polyps 2014  . Complication of anesthesia    per patient report , woke during endoscopy in 2014 ; no issues with subsequent colonscopy also  in 2014   . Costochondritis    srturggled wiht it since age 89 ; exacerbates with excessive movement of left arm; did phsycial therapy in 08-2017 and hasnt had issues with it since   . Depression   . Eating disorder    Binge eating  . Fibromyalgia   . Gallstones   . GERD (gastroesophageal reflux disease)   . Heart murmur    at birth; resolved   . Hyperlipidemia   . Hypertension    was due to birth control  , " as soon as they took it out, it went away  "     Past Surgical History:  Procedure Laterality Date  . CHOLECYSTECTOMY    . COLONOSCOPY     [facility] dr Deatra Ina   . EVALUATION UNDER ANESTHESIA WITH HEMORRHOIDECTOMY N/A 06/24/2018   Procedure: EXAM UNDER ANESTHESIA WITH EXTERNAL HEMORRHOIDECTOMY;  Surgeon: Johnathan Hausen, MD;  Location: [facility];  Service: General;  Laterality: N/A;  . 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 GANGLION CYST EXCISION    . GASTRIC ROUX-EN-Y N/A  01/24/2018   Procedure: LAPAROSCOPIC ROUX-EN-Y GASTRIC BYPASS WITH UPPER ENDOSCOPY;  Surgeon: Johnathan Hausen, MD;  Location: [facility];  Service: General;  Laterality: N/A;  . SPHINCTEROTOMY N/A 06/24/2018   Procedure: LATERAL INTERNAL SPHINCTEROTOMY;  Surgeon: Johnathan Hausen, MD;  Location: [facility];  Service: General;  Laterality: N/A;  . TONSILLECTOMY  1989    Family History  Problem Relation Age of Onset  . Hyperlipidemia Mother   . Heart disease Mother        Atrial fibrilation  . Cancer Mother        ? Melanoma  . Atrial fibrillation Mother   . Diabetes Father   . Mental illness Father        Bipolar  . Leukemia Father 57       MDS<  then leukemia   . Cancer Sister        Clear cell sarcoma in leg  . Hypothyroidism Sister   . Hypertension Maternal Grandmother   . Heart disease Maternal Grandmother        Afib  . Arrhythmia Maternal Grandmother   . Arthritis Paternal Grandmother   . Arrhythmia Maternal Grandfather   . Colon cancer Neg Hx   . Rectal cancer Neg Hx   . Pancreatic cancer Neg Hx     SOCIAL HX:  reports that she has never smoked. She has never used smokeless tobacco. She reports current alcohol use. She reports that she does not use drugs.   Current Outpatient Medications:  .  buPROPion (WELLBUTRIN) 100 MG tablet, Take 1 tablet (100 mg total) by mouth daily., Disp: 90 tablet, Rfl: 1 .  calcium gluconate 500 MG tablet, Take 1 tablet by mouth 2 (two) times daily. , Disp: , Rfl:  .  clonazePAM (KLONOPIN) 0.5 MG tablet, Take 1 tablet (0.5 mg total) by mouth daily as needed for anxiety., Disp: 30 tablet, Rfl: 5 .  FLUoxetine (PROZAC) 20 MG tablet, Take 3 tablets (60 mg total) by mouth daily., Disp: 270 tablet, Rfl: 2 .  levonorgestrel (MIRENA) 20 MCG/24HR IUD, 1 Intra Uterine Device (1 each total) by Intrauterine route once., Disp: 1 each, Rfl: 0 .  Multiple Vitamins-Iron (MULTI-VITAMIN/IRON PO), Take 1 tablet by mouth daily. , Disp: , Rfl:  .  omeprazole (PRILOSEC OTC) 20 MG tablet, Take 20 mg by mouth daily before breakfast. , Disp: , Rfl:  .  ondansetron (ZOFRAN ODT) 4 MG disintegrating tablet, Take 1 tablet (4 mg total) by mouth every 8 (eight) hours as needed for nausea or vomiting., Disp: 20 tablet, Rfl: 0 .  pramipexole (MIRAPEX) 0.125 MG tablet, TAKE 1 TABLET BY MOUTH DAILY AT BEDTIME. INCREASE WEEKLY AS NEEDED, Disp: 90 tablet, Rfl: 1 .  predniSONE (DELTASONE) 10 MG tablet, 6 tablets daily for 3 days, then reduce by 1 tablet daily until gone, Disp: 33 tablet, Rfl: 0 .  rizatriptan (MAXALT) 10 MG tablet, TAKE 1 TABLET BY MOUTH AS NEEDED FOR MIGRAINE. MAY REPEAT IN 2 HOURS IF NEEDED, Disp: 10 tablet, Rfl:  2 .  topiramate (TOPAMAX) 100 MG tablet, Take 1 tablet (100 mg total) by mouth daily., Disp: 90 tablet, Rfl: 1 .  traMADol (ULTRAM) 50 MG tablet, Take 1 tablet (50 mg total) by mouth every 6 (six) hours as needed for up to 5 days., Disp: 28 tablet, Rfl: 0 .  traZODone (DESYREL) 50 MG tablet, TAKE 0.5-1 TABLETS (25-50 MG TOTAL) BY MOUTH AT BEDTIME AS NEEDED FOR SLEEP., Disp: 90 tablet, Rfl: 2 .  Vitamin D, Ergocalciferol, (  DRISDOL) 1.25 MG (50000 UNIT) CAPS capsule, TAKE 1 CAPSULE BY MOUTH ONE TIME PER WEEK, Disp: 12 capsule, Rfl: 3  EXAM:  VITALS per patient if applicable:  GENERAL: alert, oriented, appears well and in no acute distress  HEENT: atraumatic, conjunttiva clear, no obvious abnormalities on inspection of external nose and ears  NECK: normal movements of the head and neck  LUNGS: on inspection no signs of respiratory distress, breathing rate appears normal, no obvious gross SOB, gasping or wheezing  CV: no obvious cyanosis  MS: moves all visible extremities without noticeable abnormality  PSYCH/NEURO: pleasant and cooperative, no obvious depression or anxiety, speech and thought processing grossly intact  ASSESSMENT AND PLAN:  Discussed the following assessment and plan:  New persistent daily headache - Plan: MR Brain W Wo Contrast  New persistent daily headache Etiology unclear  ddx includes growth of pituitary tumor noted in 2016 found during workup for galactorrhea, as well as progression of cervical disk disease.  Trial of prednisone taper and tramadol , repeat MRI brain needed .  Follow up in office in 2 weeks for neurologic exam     I discussed the assessment and treatment plan with the patient. The patient was provided an opportunity to ask questions and all were answered. The patient agreed with the plan and demonstrated an understanding of the instructions.   The patient was advised to call back or seek an in-person evaluation if the symptoms worsen or if the  condition fails to improve as anticipated.   I spent 30 minutes dedicated to the care of this patient on the date of this encounter to include pre-visit review of his medical history,  Face-to-face time with the patient , and post visit ordering of testing and therapeutics.    [name], MD

## 2020-12-29 ENCOUNTER — Ambulatory Visit
Admission: RE | Admit: 2020-12-29 | Discharge: 2020-12-29 | Disposition: A | Discharge status: Home / Self Care | Payer: BC Managed Care – PPO | Source: Ambulatory Visit | Attending: Internal Medicine | Admitting: Internal Medicine

## 2020-12-29 ENCOUNTER — Other Ambulatory Visit: Payer: Self-pay

## 2020-12-29 DIAGNOSIS — G4452 New daily persistent headache (NDPH): Secondary | ICD-10-CM | POA: Diagnosis present

## 2020-12-29 MED ORDER — GADOBUTROL 1 MMOL/ML IV SOLN
5.0000 mL | Freq: Once | INTRAVENOUS | Status: AC | PRN
  Administered 2020-12-29: 5 mL via INTRAVENOUS

## 2021-01-03 NOTE — Telephone Encounter (Signed)
Please schedule a follow up visit to discuss your headaches again before we do any more MRIs or referrals. .    Too many back and forth mychart messages

## 2021-01-14 ENCOUNTER — Other Ambulatory Visit: Payer: Self-pay | Admitting: Internal Medicine

## 2021-01-14 DIAGNOSIS — G43019 Migraine without aura, intractable, without status migrainosus: Secondary | ICD-10-CM

## 2021-01-28 ENCOUNTER — Other Ambulatory Visit: Payer: Self-pay

## 2021-01-28 ENCOUNTER — Encounter: Payer: Self-pay | Admitting: Internal Medicine

## 2021-01-28 ENCOUNTER — Ambulatory Visit: Payer: BC Managed Care – PPO | Admitting: Internal Medicine

## 2021-01-28 DIAGNOSIS — F32A Depression, unspecified: Secondary | ICD-10-CM

## 2021-01-28 DIAGNOSIS — F411 Generalized anxiety disorder: Secondary | ICD-10-CM | POA: Diagnosis not present

## 2021-01-28 DIAGNOSIS — F329 Major depressive disorder, single episode, unspecified: Secondary | ICD-10-CM

## 2021-01-28 MED ORDER — CLONAZEPAM 0.5 MG PO TABS: 0.5000 mg | ORAL_TABLET | Freq: Two times a day (BID) | ORAL | 0 refills | Status: DC
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
## 2021-01-28 NOTE — Patient Instructions (Addendum)
Reduce wellbutrin to 1/2 tablet daily  Resume clonazepam instead of diazepam twice daily as needed for  anxiety   Continue trazodone 1 hour before bedtime for insomnia   Tramadol is for pain.  It is a controlled substance,  So use tylenol FIRST

## 2021-01-29 DIAGNOSIS — F411 Generalized anxiety disorder: Secondary | ICD-10-CM | POA: Insufficient documentation
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
## 2021-01-29 NOTE — Assessment & Plan Note (Signed)
Managed with prozac a

## 2021-02-10 ENCOUNTER — Other Ambulatory Visit: Payer: Self-pay | Admitting: Internal Medicine

## 2021-02-28 ENCOUNTER — Other Ambulatory Visit: Payer: Self-pay | Admitting: Internal Medicine

## 2021-03-20 LAB — HM PAP SMEAR
HM Pap smear: NEGATIVE
HM Pap smear: NORMAL

## 2021-03-20 LAB — HM MAMMOGRAPHY: HM Mammogram: NORMAL (ref 0–4)

## 2021-03-21 ENCOUNTER — Telehealth (INDEPENDENT_AMBULATORY_CARE_PROVIDER_SITE_OTHER): Payer: BC Managed Care – PPO | Admitting: Internal Medicine

## 2021-03-21 ENCOUNTER — Encounter: Payer: Self-pay | Admitting: Internal Medicine

## 2021-03-21 VITALS — BP 110/64 | Ht 62.0 in | Wt 130.2 lb

## 2021-03-21 DIAGNOSIS — D126 Benign neoplasm of colon, unspecified: Secondary | ICD-10-CM | POA: Diagnosis not present

## 2021-03-21 DIAGNOSIS — F411 Generalized anxiety disorder: Secondary | ICD-10-CM | POA: Diagnosis not present

## 2021-03-21 DIAGNOSIS — F329 Major depressive disorder, single episode, unspecified: Secondary | ICD-10-CM | POA: Diagnosis not present

## 2021-03-21 DIAGNOSIS — F32A Depression, unspecified: Secondary | ICD-10-CM

## 2021-03-21 NOTE — Progress Notes (Signed)
Virtual Visit via Caregilty   This visit type was conducted due to national recommendations for restrictions regarding the COVID-19 pandemic (e.g. social distancing).  This format is felt to be most appropriate for this patient at this time.  All issues noted in this document were discussed and addressed.  No physical exam was performed (except for noted visual exam findings with Video Visits).   I connected with NAME@ on 03/21/21 at  4:30 PM EDT by a video enabled telemedicine application  and verified that I am speaking with the correct person using two identifiers. Location patient: home Location provider: work or home office Persons participating in the virtual visit: patient, provider  I discussed the limitations, risks, security and privacy concerns of performing an evaluation and management service by telephone and the availability of in person appointments. I also discussed with the patient that there may be a patient responsible charge related to this service. The patient expressed understanding and agreed to proceed   Reason for visit: follow up on GAD complicated by  depression with insomnia   HPI:  55 yr old female with history of GAD and depression presents for 2 weeks follow up after medication change  Patient was advised in mid may to suspend her  wellbutrin ,  delay her 2nd dose of clonazepam until 6 pm,  and increase the trazodone to 100 mg for management of increased anxiety  However she states that she has not taken clonazepam in 2 weeks  because she ran out ,  and decided since the source of her anxiety (her job) was no longer present, s he would try going without it.  She is currently taking prozac and trazodone .  She  feels very calm for the last several weeks and is enjoying reading,  taking long baths,  and decorating her new home.  She  has had a few minor episodes of anxiety but has not started using the PTSD and [employer] apps recommended by her therapist.   She has  had her GYN and dental exams and is overdue for colonoscopy    ROS: See pertinent positives and negatives per HPI.  Past Medical History:  Diagnosis Date   Anemia, iron deficiency    Anxiety    Asthma    Colon polyps 4098   Complication of anesthesia    per patient report , woke during endoscopy in 2014 ; no issues with subsequent colonscopy also  in 2014    Costochondritis    srturggled wiht it since age 66 ; exacerbates with excessive movement of left arm; did phsycial therapy in 08-2017 and hasnt had issues with it since    Depression    Eating disorder    Binge eating   Fibromyalgia    Gallstones    GERD (gastroesophageal reflux disease)    Heart murmur    at birth; resolved    Hyperlipidemia    Hypertension    was due to birth control  , " as soon as they took it out, it went away  "     Past Surgical History:  Procedure Laterality Date   CHOLECYSTECTOMY     COLONOSCOPY     [facility] dr Deatra Ina    EVALUATION UNDER ANESTHESIA WITH HEMORRHOIDECTOMY N/A 06/24/2018   Procedure: EXAM UNDER ANESTHESIA WITH EXTERNAL HEMORRHOIDECTOMY;  Surgeon: Johnathan Hausen, MD;  Location: [facility];  Service: General;  Laterality: N/A;   EXPLORATORY LAPAROTOMY  2000   for infertility   GANGLION CYST EXCISION  GASTRIC ROUX-EN-Y N/A 01/24/2018   Procedure: LAPAROSCOPIC ROUX-EN-Y GASTRIC BYPASS WITH UPPER ENDOSCOPY;  Surgeon: Johnathan Hausen, MD;  Location: [facility];  Service: General;  Laterality: N/A;   SPHINCTEROTOMY N/A 06/24/2018   Procedure: LATERAL INTERNAL SPHINCTEROTOMY;  Surgeon: Johnathan Hausen, MD;  Location: [facility];  Service: General;  Laterality: N/A;   TONSILLECTOMY  1989    Family History  Problem Relation Age of Onset   Hyperlipidemia Mother    Heart disease Mother        Atrial fibrilation   Cancer Mother        ? Melanoma   Atrial fibrillation Mother    Diabetes Father    Mental illness Father        Bipolar   Leukemia Father 69       MDS< then leukemia     Cancer Sister        Clear cell sarcoma in leg   Hypothyroidism Sister    Hypertension Maternal Grandmother    Heart disease Maternal Grandmother        Afib   Arrhythmia Maternal Grandmother    Arthritis Paternal Grandmother    Arrhythmia Maternal Grandfather    Colon cancer Neg Hx    Rectal cancer Neg Hx    Pancreatic cancer Neg Hx     SOCIAL HX:   reports that she has never smoked. She has never used smokeless tobacco. She reports current alcohol use. She reports that she does not use drugs.    Current Outpatient Medications:    calcium gluconate 500 MG tablet, Take 1 tablet by mouth 2 (two) times daily. , Disp: , Rfl:    FLUoxetine (PROZAC) 20 MG tablet, Take 3 tablets (60 mg total) by mouth daily., Disp: 270 tablet, Rfl: 2   levonorgestrel (MIRENA) 20 MCG/24HR IUD, 1 Intra Uterine Device (1 each total) by Intrauterine route once., Disp: 1 each, Rfl: 0   Multiple Vitamins-Iron (MULTI-VITAMIN/IRON PO), Take 1 tablet by mouth daily. , Disp: , Rfl:    omeprazole (PRILOSEC OTC) 20 MG tablet, Take 20 mg by mouth daily before breakfast. , Disp: , Rfl:    ondansetron (ZOFRAN ODT) 4 MG disintegrating tablet, Take 1 tablet (4 mg total) by mouth every 8 (eight) hours as needed for nausea or vomiting., Disp: 20 tablet, Rfl: 0   pramipexole (MIRAPEX) 0.125 MG tablet, TAKE 1 TABLET BY MOUTH DAILY AT BEDTIME. INCREASE WEEKLY AS NEEDED, Disp: 90 tablet, Rfl: 1   rizatriptan (MAXALT) 10 MG tablet, TAKE 1 TABLET BY MOUTH AS NEEDED FOR MIGRAINE. MAY REPEAT IN 2 HOURS IF NEEDED, Disp: 10 tablet, Rfl: 2   topiramate (TOPAMAX) 100 MG tablet, TAKE 1 TABLET BY MOUTH EVERY DAY, Disp: 90 tablet, Rfl: 1   traZODone (DESYREL) 50 MG tablet, TAKE 0.5-1 TABLETS (25-50 MG TOTAL) BY MOUTH AT BEDTIME AS NEEDED FOR SLEEP., Disp: 90 tablet, Rfl: 2   Vitamin D, Ergocalciferol, (DRISDOL) 1.25 MG (50000 UNIT) CAPS capsule, TAKE 1 CAPSULE BY MOUTH ONE TIME PER WEEK, Disp: 12 capsule, Rfl: 3  EXAM:  VITALS per patient  if applicable:  GENERAL: alert, oriented, appears well and in no acute distress  HEENT: atraumatic, conjunttiva clear, no obvious abnormalities on inspection of external nose and ears  NECK: normal movements of the head and neck  LUNGS: on inspection no signs of respiratory distress, breathing rate appears normal, no obvious gross SOB, gasping or wheezing  CV: no obvious cyanosis  MS: moves all visible extremities without noticeable abnormality  PSYCH/NEURO:  pleasant and cooperative, no obvious depression or anxiety, speech and thought processing grossly intact  ASSESSMENT AND PLAN:  Discussed the following assessment and plan:  Tubular adenoma of colon - Plan: Ambulatory referral to Gastroenterology  Chronic depressive disorder  Generalized anxiety disorder  Chronic depressive disorder Improved with summer break and removal of daily stressors..  Continue prozac; no changes   Generalized anxiety disorder Resolved with summer break and isolation.  No longer using clonazepam.  Encouraged to use the apps provided  by her therapist for PTSD    I discussed the assessment and treatment plan with the patient. The patient was provided an opportunity to ask questions and all were answered. The patient agreed with the plan and demonstrated an understanding of the instructions.   The patient was advised to call back or seek an in-person evaluation if the symptoms worsen or if the condition fails to improve as anticipated.   I spent 30 minutes dedicated to the care of this patient on the date of this encounter to include pre-visit review of his medical history,  Face-to-face time with the patient , and post visit ordering of testing and therapeutics.    [name], MD

## 2021-03-23 NOTE — Assessment & Plan Note (Signed)
Resolved with summer break and isolation.  No longer using clonazepam.  Encouraged to use the apps provided  by her therapist for PTSD

## 2021-03-23 NOTE — Assessment & Plan Note (Signed)
Improved with summer break and removal of daily stressors..  Continue prozac; no changes

## 2021-04-01 ENCOUNTER — Telehealth: Payer: Self-pay | Admitting: Internal Medicine

## 2021-04-01 MED ORDER — CLONAZEPAM 0.5 MG PO TABS: 0.5000 mg | ORAL_TABLET | Freq: Every day | ORAL | 0 refills | Status: DC | PRN

## 2021-04-01 NOTE — Telephone Encounter (Signed)
Spoke with pt and she stated that per last office note she was told that if she found herself having more anxiety and needing to take the diazepam to call. Pt stated that she is having more anxiety and wanted to see about getting the clonazepam refill. Pt stated that she is fixing to take a trip to [place] to meet her boyfriend's mom and thinks that maybe she is having anxiety from that or from just being at home since school is out for the summer.

## 2021-04-01 NOTE — Addendum Note (Signed)
Addended by: [name] on: 04/01/2021 02:07 PM   Modules accepted: Orders

## 2021-04-01 NOTE — Telephone Encounter (Signed)
Pt called and wanted to let Dr. Derrel Nip know that she is having anxiety and would like her meds refilled. She stated that she is now eating

## 2021-04-01 NOTE — Telephone Encounter (Signed)
Clonazepam 0.5 mg refilled for once daily use for 30 days

## 2021-04-02 NOTE — Telephone Encounter (Signed)
Spoke with pt to let her know that medication has been refilled.  

## 2021-04-28 ENCOUNTER — Other Ambulatory Visit: Payer: Self-pay

## 2021-04-29 MED ORDER — CLONAZEPAM 0.5 MG PO TABS: 0.5000 mg | ORAL_TABLET | Freq: Every day | ORAL | 5 refills | Status: DC | PRN

## 2021-04-29 NOTE — Telephone Encounter (Signed)
Medication refilled

## 2021-05-23 ENCOUNTER — Other Ambulatory Visit: Payer: Self-pay | Admitting: Internal Medicine

## 2021-06-04 ENCOUNTER — Other Ambulatory Visit: Payer: Self-pay

## 2021-06-04 ENCOUNTER — Other Ambulatory Visit: Payer: BC Managed Care – PPO

## 2021-06-04 ENCOUNTER — Other Ambulatory Visit: Payer: Self-pay | Admitting: Internal Medicine

## 2021-06-11 ENCOUNTER — Other Ambulatory Visit: Payer: Self-pay

## 2021-06-11 ENCOUNTER — Ambulatory Visit (INDEPENDENT_AMBULATORY_CARE_PROVIDER_SITE_OTHER): Payer: BC Managed Care – PPO | Admitting: Internal Medicine

## 2021-06-11 ENCOUNTER — Ambulatory Visit (INDEPENDENT_AMBULATORY_CARE_PROVIDER_SITE_OTHER): Payer: BC Managed Care – PPO

## 2021-06-11 ENCOUNTER — Encounter: Payer: Self-pay | Admitting: Internal Medicine

## 2021-06-11 VITALS — BP 100/66 | HR 75 | Temp 96.8°F | Ht 62.0 in | Wt 134.6 lb

## 2021-06-11 DIAGNOSIS — Z9884 Bariatric surgery status: Secondary | ICD-10-CM

## 2021-06-11 DIAGNOSIS — F32A Depression, unspecified: Secondary | ICD-10-CM

## 2021-06-11 DIAGNOSIS — E782 Mixed hyperlipidemia: Secondary | ICD-10-CM

## 2021-06-11 DIAGNOSIS — F329 Major depressive disorder, single episode, unspecified: Secondary | ICD-10-CM

## 2021-06-11 DIAGNOSIS — E559 Vitamin D deficiency, unspecified: Secondary | ICD-10-CM | POA: Diagnosis not present

## 2021-06-11 DIAGNOSIS — R079 Chest pain, unspecified: Secondary | ICD-10-CM

## 2021-06-11 DIAGNOSIS — R7303 Prediabetes: Secondary | ICD-10-CM

## 2021-06-11 DIAGNOSIS — Z0000 Encounter for general adult medical examination without abnormal findings: Secondary | ICD-10-CM

## 2021-06-11 DIAGNOSIS — R5383 Other fatigue: Secondary | ICD-10-CM

## 2021-06-11 DIAGNOSIS — Z0001 Encounter for general adult medical examination with abnormal findings: Secondary | ICD-10-CM

## 2021-06-11 DIAGNOSIS — R0789 Other chest pain: Secondary | ICD-10-CM

## 2021-06-11 DIAGNOSIS — G4763 Sleep related bruxism: Secondary | ICD-10-CM

## 2021-06-11 DIAGNOSIS — Z23 Encounter for immunization: Secondary | ICD-10-CM

## 2021-06-11 DIAGNOSIS — D126 Benign neoplasm of colon, unspecified: Secondary | ICD-10-CM

## 2021-06-11 MED ORDER — DICLOFENAC SODIUM 1 % EX GEL: 2.0000 g | Freq: Four times a day (QID) | CUTANEOUS | 5 refills | Status: DC

## 2021-06-11 MED ORDER — TIZANIDINE HCL 4 MG PO TABS: 4.0000 mg | ORAL_TABLET | Freq: Four times a day (QID) | ORAL | 0 refills | Status: DC | PRN
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
## 2021-06-11 NOTE — Patient Instructions (Addendum)
Ok to use diclofenac for the the chest wall pain.  Apply cream up to 4 times daily  You can  take up to 2000 mg of acetominophen  Ok to try adding muscle relaxer at night   I encourage you to add regular exercise to your daily routine

## 2021-06-12 ENCOUNTER — Telehealth: Payer: Self-pay

## 2021-06-12 LAB — CBC WITH DIFFERENTIAL/PLATELET
Basophils Absolute: 0.1 10*3/uL (ref 0.0–0.1)
Basophils Relative: 1.1 % (ref 0.0–3.0)
Eosinophils Absolute: 0.1 10*3/uL (ref 0.0–0.7)
Eosinophils Relative: 2.2 % (ref 0.0–5.0)
HCT: 40 % (ref 36.0–46.0)
Hemoglobin: 13.3 g/dL (ref 12.0–15.0)
Lymphocytes Relative: 44.5 % (ref 12.0–46.0)
Lymphs Abs: 2.4 10*3/uL (ref 0.7–4.0)
MCHC: 33.2 g/dL (ref 30.0–36.0)
MCV: 96.2 fl (ref 78.0–100.0)
Monocytes Absolute: 0.4 10*3/uL (ref 0.1–1.0)
Monocytes Relative: 6.7 % (ref 3.0–12.0)
Neutro Abs: 2.5 10*3/uL (ref 1.4–7.7)
Neutrophils Relative %: 45.5 % (ref 43.0–77.0)
Platelets: 263 10*3/uL (ref 150.0–400.0)
RBC: 4.16 Mil/uL (ref 3.87–5.11)
RDW: 12.8 % (ref 11.5–15.5)
WBC: 5.5 10*3/uL (ref 4.0–10.5)

## 2021-06-12 LAB — LIPID PANEL
Cholesterol: 158 mg/dL (ref 0–200)
HDL: 58.3 mg/dL (ref >39.00)
LDL Cholesterol: 86 mg/dL (ref 0–99)
NonHDL: 99.44
Total CHOL/HDL Ratio: 3
Triglycerides: 68 mg/dL (ref 0.0–149.0)
VLDL: 13.6 mg/dL (ref 0.0–40.0)

## 2021-06-12 LAB — T4 AND TSH
T4, Total: 4.3 ug/dL — ABNORMAL LOW (ref 4.5–12.0)
TSH: 0.833 u[IU]/mL (ref 0.450–4.500)

## 2021-06-12 LAB — FERRITIN: Ferritin: 46.4 ng/mL (ref 10.0–291.0)

## 2021-06-12 LAB — IRON AND TIBC
Iron Saturation: 27 % (ref 15–55)
Iron: 93 ug/dL (ref 27–159)
Total Iron Binding Capacity: 344 ug/dL (ref 250–450)
UIBC: 251 ug/dL (ref 131–425)

## 2021-06-12 LAB — HEMOGLOBIN A1C: Hgb A1c MFr Bld: 5.5 % (ref 4.6–6.5)

## 2021-06-12 LAB — CORTISOL: Cortisol, Plasma: 4.2 ug/dL

## 2021-06-12 LAB — MAGNESIUM: Magnesium: 2.3 mg/dL (ref 1.5–2.5)

## 2021-06-12 LAB — VITAMIN B12: Vitamin B-12: 309 pg/mL (ref 211–911)

## 2021-06-12 LAB — VITAMIN D 25 HYDROXY (VIT D DEFICIENCY, FRACTURES): VITD: 35.27 ng/mL (ref 30.00–100.00)

## 2021-06-12 LAB — SEDIMENTATION RATE: Sed Rate: 2 mm/hr (ref 0–30)

## 2021-06-12 NOTE — Telephone Encounter (Signed)
PA for Diclofenac sodium 1% gel has been submitted on covermymeds.

## 2021-06-14 DIAGNOSIS — D352 Benign neoplasm of pituitary gland: Secondary | ICD-10-CM

## 2021-06-14 DIAGNOSIS — D126 Benign neoplasm of colon, unspecified: Secondary | ICD-10-CM | POA: Insufficient documentation

## 2021-06-14 DIAGNOSIS — R5383 Other fatigue: Secondary | ICD-10-CM | POA: Insufficient documentation

## 2021-06-14 DIAGNOSIS — R7989 Other specified abnormal findings of blood chemistry: Secondary | ICD-10-CM

## 2021-06-14 NOTE — Assessment & Plan Note (Signed)
She wakes up clenching her jaw.  Tizanidine prescribed.  continue use of mouth guard
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
## 2021-06-14 NOTE — Assessment & Plan Note (Addendum)
Chest x ray ordered .  Diclofenac gel for use on sternum, given History of costochondritis

## 2021-06-14 NOTE — Assessment & Plan Note (Addendum)
She is overdue for 5 yr follow up colonoscopy. She was referred in August 2019 but apparently deferred.  Referral in progress

## 2021-06-14 NOTE — Assessment & Plan Note (Signed)
She has a borderline low cortisol level, a low T4 and a history of pituitary microadenoma.  Recommending Endocrinology referral for further evaluation. B12 and zinc were also low and will be addressed

## 2021-06-14 NOTE — Assessment & Plan Note (Signed)
She is not sleeping well and seeing a therapist.  Encouraged to refrain from escalating use of clonazepam and instead make lifestyle changes including regular participation in exercise

## 2021-06-14 NOTE — Assessment & Plan Note (Addendum)
with first weight gain since surgery in 2019.  Her eating disorder is becoming relevant again.  She has done a "pouch reset" and is seeing a therapist, but not exercising yet.  She is due for screening labs to monitor for iatrogenic deficiencies which may be contributing to her fatigue.

## 2021-06-14 NOTE — Assessment & Plan Note (Signed)

## 2021-06-16 ENCOUNTER — Other Ambulatory Visit (INDEPENDENT_AMBULATORY_CARE_PROVIDER_SITE_OTHER): Payer: Self-pay

## 2021-06-16 ENCOUNTER — Other Ambulatory Visit: Payer: Self-pay

## 2021-06-16 DIAGNOSIS — D126 Benign neoplasm of colon, unspecified: Secondary | ICD-10-CM

## 2021-06-16 MED ORDER — NA SULFATE-K SULFATE-MG SULF 17.5-3.13-1.6 GM/177ML PO SOLN: 1.0000 | Freq: Once | ORAL | 0 refills | Status: AC

## 2021-06-16 NOTE — Progress Notes (Signed)
Gastroenterology Pre-Procedure Review  Request Date: 08/01/21 Requesting Physician: Dr. Bonna Gains  PATIENT REVIEW QUESTIONS: The patient responded to the following health history questions as indicated:    1. Are you having any GI issues?  Constipation sometimes b/c of weight loss surgery. 2. Do you have a personal history of Polyps? no 3. Do you have a family history of Colon Cancer or Polyps? no 4. Diabetes Mellitus? no 5. Joint replacements in the past 12 months?no 6. Major health problems in the past 3 months?no 7. Any artificial heart valves, MVP, or defibrillator?no    MEDICATIONS & ALLERGIES:    Patient reports the following regarding taking any anticoagulation/antiplatelet therapy:   Plavix, Coumadin, Eliquis, Xarelto, Lovenox, Pradaxa, Brilinta, or Effient? no Aspirin? no  Patient confirms/reports the following medications:  Current Outpatient Medications  Medication Sig Dispense Refill   Na Sulfate-K Sulfate-Mg Sulf 17.5-3.13-1.6 GM/177ML SOLN Take 1 kit by mouth once for 1 dose. 354 mL 0   calcium gluconate 500 MG tablet Take 1 tablet by mouth 2 (two) times daily.      clonazePAM (KLONOPIN) 0.5 MG tablet Take 1 tablet (0.5 mg total) by mouth daily as needed for anxiety. 30 tablet 5   diclofenac Sodium (VOLTAREN) 1 % GEL Apply 2 g topically 4 (four) times daily. 100 g 5   estradiol (VIVELLE-DOT) 0.025 MG/24HR Place 1 patch onto the skin 2 (two) times a week.     FLUoxetine (PROZAC) 20 MG tablet TAKE 3 TABLETS BY MOUTH EVERY DAY 270 tablet 2   levonorgestrel (MIRENA) 20 MCG/24HR IUD 1 Intra Uterine Device (1 each total) by Intrauterine route once. 1 each 0   Multiple Vitamins-Iron (MULTI-VITAMIN/IRON PO) Take 1 tablet by mouth daily.      omeprazole (PRILOSEC OTC) 20 MG tablet Take 20 mg by mouth daily before breakfast.      ondansetron (ZOFRAN ODT) 4 MG disintegrating tablet Take 1 tablet (4 mg total) by mouth every 8 (eight) hours as needed for nausea or vomiting. 20 tablet  0   pramipexole (MIRAPEX) 0.125 MG tablet TAKE 1 TABLET BY MOUTH DAILY AT BEDTIME. INCREASE WEEKLY AS NEEDED 90 tablet 1   rizatriptan (MAXALT) 10 MG tablet TAKE 1 TABLET BY MOUTH AS NEEDED FOR MIGRAINE. MAY REPEAT IN 2 HOURS IF NEEDED 10 tablet 2   tiZANidine (ZANAFLEX) 4 MG tablet Take 1 tablet (4 mg total) by mouth every 6 (six) hours as needed for muscle spasms. 30 tablet 0   topiramate (TOPAMAX) 100 MG tablet TAKE 1 TABLET BY MOUTH EVERY DAY 90 tablet 1   traZODone (DESYREL) 50 MG tablet TAKE 0.5-1 TABLETS (25-50 MG TOTAL) BY MOUTH AT BEDTIME AS NEEDED FOR SLEEP. 90 tablet 2   Vitamin D, Ergocalciferol, (DRISDOL) 1.25 MG (50000 UNIT) CAPS capsule TAKE 1 CAPSULE BY MOUTH ONE TIME PER WEEK 12 capsule 3   No current facility-administered medications for this visit.    Patient confirms/reports the following allergies:  Allergies  Allergen Reactions   Fish Allergy Nausea And Vomiting   Augmentin [Amoxicillin-Pot Clavulanate] Diarrhea    Can take regular amoxicillin with no issues Has patient had a PCN reaction causing immediate rash, facial/tongue/throat swelling, SOB or lightheadedness with hypotension: No Has patient had a PCN reaction causing severe rash involving mucus membranes or skin necrosis: No Has patient had a PCN reaction that required hospitalization: No Has patient had a PCN reaction occurring within the last 10 years: Yes--but DIARRHEA ONLY If all of the above answers are "NO", then may  proceed with Cephalosporin [place]   Citalopram Other (See Comments)    Tachycardia    Eggs Or Egg-Derived Products Diarrhea and Nausea And Vomiting   Sulfonamide Derivatives Itching    No orders of the defined types were placed in this encounter.   AUTHORIZATION INFORMATION Primary Insurance: 1D#: Group #:  Secondary Insurance: 1D#: Group #:  SCHEDULE INFORMATION: Date: 08/01/21 Time: Location: [facility]

## 2021-06-18 LAB — VITAMIN B1: Vitamin B1 (Thiamine): 18 nmol/L (ref 8–30)

## 2021-06-18 LAB — VITAMIN B6: Vitamin B6: 40.3 ng/mL — ABNORMAL HIGH (ref 2.1–21.7)

## 2021-06-18 LAB — VITAMIN K1, SERUM: Vitamin K: 287 pg/mL (ref 130–1500)

## 2021-06-18 NOTE — Telephone Encounter (Signed)
Patient calling back in to check on endo referral . Please advise

## 2021-06-19 ENCOUNTER — Telehealth: Payer: Self-pay

## 2021-06-19 NOTE — Telephone Encounter (Signed)
Error

## 2021-06-19 NOTE — Telephone Encounter (Signed)
PA for Diclofenac gel has been denied by insurance because pt does not have a diagnosis of osteoporosis.

## 2021-06-20 ENCOUNTER — Encounter: Payer: Self-pay | Admitting: Internal Medicine

## 2021-06-20 LAB — VITAMIN A: Vitamin A (Retinoic Acid): 35 ug/dL — ABNORMAL LOW (ref 38–98)

## 2021-06-20 LAB — VITAMIN E
Gamma-Tocopherol (Vit E): <1 mg/L (ref <=4.3)
Vitamin E (Alpha Tocopherol): 10.1 mg/L (ref 5.7–19.9)

## 2021-06-20 LAB — EXTRA SPECIMEN

## 2021-06-20 LAB — COPPER, FREE: COPPER - FREE, SERUM/PLASMA: 410 mcg/L

## 2021-06-20 LAB — ZINC: Zinc: 54 ug/dL — ABNORMAL LOW (ref 60–130)

## 2021-06-20 NOTE — Telephone Encounter (Signed)
Pt is aware that this medication is not covered by her insurance but she can get it OTC now. Pt gave a verbal understanding.

## 2021-06-25 ENCOUNTER — Other Ambulatory Visit: Payer: Self-pay | Admitting: Internal Medicine

## 2021-06-25 ENCOUNTER — Telehealth: Payer: Self-pay

## 2021-06-25 DIAGNOSIS — Z9884 Bariatric surgery status: Secondary | ICD-10-CM

## 2021-06-25 DIAGNOSIS — E610 Copper deficiency: Secondary | ICD-10-CM

## 2021-06-25 NOTE — Telephone Encounter (Signed)
Patient has been scheduled for test tomorrow in lab.

## 2021-06-25 NOTE — Telephone Encounter (Signed)
Patient called and would like clarification on her lab result COPPER - FREE, SERUM/PLASMA. Please advise if normal. She is stating its too hight per the ranges but is not marking as abnormal.  Is it actually normal please advise.

## 2021-06-25 NOTE — Telephone Encounter (Signed)
Error - duplicate

## 2021-06-25 NOTE — Progress Notes (Unsigned)
I cannot find any information on the normal range for this test.  And the lab cannot help me either.  I would like to run a different test to rule out copper overload.  Can she return at her leisure for this test?

## 2021-06-26 ENCOUNTER — Other Ambulatory Visit (INDEPENDENT_AMBULATORY_CARE_PROVIDER_SITE_OTHER): Payer: BC Managed Care – PPO

## 2021-06-26 ENCOUNTER — Telehealth: Payer: Self-pay

## 2021-06-26 ENCOUNTER — Other Ambulatory Visit: Payer: Self-pay

## 2021-06-26 DIAGNOSIS — E610 Copper deficiency: Secondary | ICD-10-CM | POA: Diagnosis not present

## 2021-06-26 NOTE — Telephone Encounter (Signed)
Pt. Calling to see if she can get a coupon to help with the cost of her prep.

## 2021-06-27 LAB — CERULOPLASMIN: Ceruloplasmin: 22 mg/dL (ref 18–53)

## 2021-06-27 MED ORDER — PEG 3350-KCL-NA BICARB-NACL 420 G PO SOLR: 4000.0000 mL | Freq: Once | ORAL | 0 refills | Status: AC

## 2021-06-27 NOTE — Telephone Encounter (Signed)
Returned patients call. Informed patient I will send in a different prep. Sup[facility] does not have coupons. Prep will be sent to pharmacy.

## 2021-07-03 ENCOUNTER — Encounter: Payer: Self-pay | Admitting: Family

## 2021-07-03 ENCOUNTER — Other Ambulatory Visit: Payer: Self-pay

## 2021-07-03 ENCOUNTER — Telehealth: Payer: BC Managed Care – PPO | Admitting: Nurse Practitioner

## 2021-07-03 ENCOUNTER — Ambulatory Visit (INDEPENDENT_AMBULATORY_CARE_PROVIDER_SITE_OTHER): Payer: BC Managed Care – PPO | Admitting: Family

## 2021-07-03 VITALS — BP 100/66 | HR 74 | Temp 98.1°F | Ht 62.0 in | Wt 136.6 lb

## 2021-07-03 DIAGNOSIS — M545 Low back pain, unspecified: Secondary | ICD-10-CM

## 2021-07-03 DIAGNOSIS — R300 Dysuria: Secondary | ICD-10-CM

## 2021-07-03 DIAGNOSIS — R350 Frequency of micturition: Secondary | ICD-10-CM

## 2021-07-03 DIAGNOSIS — N390 Urinary tract infection, site not specified: Secondary | ICD-10-CM

## 2021-07-03 LAB — POCT URINALYSIS DIPSTICK
Bilirubin, UA: NEGATIVE
Blood, UA: NEGATIVE
Glucose, UA: NEGATIVE
Ketones, UA: NEGATIVE
Leukocytes, UA: NEGATIVE
Nitrite, UA: NEGATIVE
Protein, UA: NEGATIVE
Spec Grav, UA: 1.015 (ref 1.010–1.025)
Urobilinogen, UA: 0.2 E.U./dL
pH, UA: 6.5 (ref 5.0–8.0)

## 2021-07-03 MED ORDER — NITROFURANTOIN MONOHYD MACRO 100 MG PO CAPS: 100.0000 mg | ORAL_CAPSULE | Freq: Two times a day (BID) | ORAL | 0 refills | Status: DC

## 2021-07-03 NOTE — Progress Notes (Signed)
Based on what you shared with me, I feel your condition warrants further evaluation and I recommend that you be seen in a face to face visit.  Based on the characteristics of your urine and back/abdominal pain I would recommend you be evaluated by your PCP or urgent care so that you can be treated for the most appropriate condition. You likely need a urine culture to determine the type of bacteria in your urine so that you can be started on the appropriate antibiotic.    NOTE: There will be NO CHARGE for this eVisit   If you are having a true medical emergency please call 911.      For an urgent face to face visit, [facility] has six urgent care centers for your convenience:     [facility] [facility] [place] Get Driving Directions [phone] [place], [place] [address]    [facility]) Get Driving Directions [phone] [place], [place] [address]  [facility]) Get Driving Directions [phone] [address] [place],  [place]  [address]  [facility] Get Driving Directions [phone] [address] [place], [place], [place] [address]   [facility] Get Driving Directions  [phone] [address] [place], [place] [address]   [facility] at  Get Driving Directions [phone] [address] [place], [place] [address]  Your MyChart E-visit questionnaire answers were reviewed by a board certified advanced clinical practitioner to complete your personal care plan based on your specific symptoms.  Thank you for using e-Visits.

## 2021-07-04 LAB — URINE CULTURE
MICRO NUMBER:: 12440203
Result:: NO GROWTH
SPECIMEN QUALITY:: ADEQUATE

## 2021-07-07 NOTE — Progress Notes (Signed)
Acute Office Visit  Subjective:    Patient ID: Selena [name], female    DOB: 1966/03/10, 55 y.o.   MRN: [id number]  Chief Complaint  Patient presents with   Urinary Frequency    Started over the weekend.    Back Pain   Pelvic Pain    HPI Patient is in today with c/o urinary frequency, back pain, and pelvic pain x 5-6 days and worsening. No fever or chills.  Past Medical History:  Diagnosis Date   Anemia, iron deficiency    Anxiety    Asthma    Colon polyps 6659   Complication of anesthesia    per patient report , woke during endoscopy in 2014 ; no issues with subsequent colonscopy also  in 2014    Costochondritis    srturggled wiht it since age 64 ; exacerbates with excessive movement of left arm; did phsycial therapy in 08-2017 and hasnt had issues with it since    Depression    Eating disorder    Binge eating   Fibromyalgia    Gallstones    GERD (gastroesophageal reflux disease)    Heart murmur    at birth; resolved    Hyperlipidemia    Hypertension    was due to birth control  , " as soon as they took it out, it went away  "     Past Surgical History:  Procedure Laterality Date   CHOLECYSTECTOMY     COLONOSCOPY     [facility] dr Deatra Ina    EVALUATION UNDER ANESTHESIA WITH HEMORRHOIDECTOMY N/A 06/24/2018   Procedure: EXAM UNDER ANESTHESIA WITH EXTERNAL HEMORRHOIDECTOMY;  Surgeon: Johnathan Hausen, MD;  Location: [facility];  Service: General;  Laterality: N/A;   EXPLORATORY LAPAROTOMY  2000   for infertility   GANGLION CYST EXCISION     GASTRIC ROUX-EN-Y N/A 01/24/2018   Procedure: LAPAROSCOPIC ROUX-EN-Y GASTRIC BYPASS WITH UPPER ENDOSCOPY;  Surgeon: Johnathan Hausen, MD;  Location: [facility];  Service: General;  Laterality: N/A;   SPHINCTEROTOMY N/A 06/24/2018   Procedure: LATERAL INTERNAL SPHINCTEROTOMY;  Surgeon: Johnathan Hausen, MD;  Location: [facility];  Service: General;  Laterality: N/A;   TONSILLECTOMY  1989    Family History  Problem Relation Age of  Onset   Hyperlipidemia Mother    Heart disease Mother        Atrial fibrilation   Cancer Mother        ? Melanoma   Atrial fibrillation Mother    Diabetes Father    Mental illness Father        Bipolar   Leukemia Father 35       MDS< then leukemia    Cancer Sister        Clear cell sarcoma in leg   Hypothyroidism Sister    Hypertension Maternal Grandmother    Heart disease Maternal Grandmother        Afib   Arrhythmia Maternal Grandmother    Arthritis Paternal Grandmother    Arrhythmia Maternal Grandfather    Colon cancer Neg Hx    Rectal cancer Neg Hx    Pancreatic cancer Neg Hx     Social History   Socioeconomic History   Marital status: Single    Spouse name: Not on file   Number of children: 1   Years of education: Not on file   Highest education level: Not on file  Occupational History   Occupation: Teaches preschool    Employer: [employer]  Tobacco Use  Smoking status: Never   Smokeless tobacco: Never  Vaping Use   Vaping Use: Never used  Substance and Sexual Activity   Alcohol use: Yes    Comment: occasional   Drug use: No   Sexual activity: Yes    Birth control/protection: I.U.D.  Other Topics Concern   Not on file  Social History Narrative   2 Step children; asthma, ADHD   Social Determinants of Health   Financial Resource Strain: Not on file  Food Insecurity: Not on file  Transportation Needs: Not on file  Physical Activity: Not on file  Stress: Not on file  Social Connections: Not on file  Intimate Partner Violence: Not on file    Outpatient Medications Prior to Visit  Medication Sig Dispense Refill   calcium gluconate 500 MG tablet Take 1 tablet by mouth 2 (two) times daily.      clonazePAM (KLONOPIN) 0.5 MG tablet Take 1 tablet (0.5 mg total) by mouth daily as needed for anxiety. 30 tablet 5   diclofenac Sodium (VOLTAREN) 1 % GEL Apply 2 g topically 4 (four) times daily. 100 g 5   estradiol (VIVELLE-DOT) 0.025 MG/24HR Place 1  patch onto the skin 2 (two) times a week.     FLUoxetine (PROZAC) 20 MG tablet TAKE 3 TABLETS BY MOUTH EVERY DAY 270 tablet 2   levonorgestrel (MIRENA) 20 MCG/24HR IUD 1 Intra Uterine Device (1 each total) by Intrauterine route once. 1 each 0   Multiple Vitamins-Iron (MULTI-VITAMIN/IRON PO) Take 1 tablet by mouth daily.      omeprazole (PRILOSEC OTC) 20 MG tablet Take 20 mg by mouth daily before breakfast.      ondansetron (ZOFRAN ODT) 4 MG disintegrating tablet Take 1 tablet (4 mg total) by mouth every 8 (eight) hours as needed for nausea or vomiting. 20 tablet 0   pramipexole (MIRAPEX) 0.125 MG tablet TAKE 1 TABLET BY MOUTH DAILY AT BEDTIME. INCREASE WEEKLY AS NEEDED 90 tablet 1   rizatriptan (MAXALT) 10 MG tablet TAKE 1 TABLET BY MOUTH AS NEEDED FOR MIGRAINE. MAY REPEAT IN 2 HOURS IF NEEDED 10 tablet 2   tiZANidine (ZANAFLEX) 4 MG tablet Take 1 tablet (4 mg total) by mouth every 6 (six) hours as needed for muscle spasms. 30 tablet 0   topiramate (TOPAMAX) 100 MG tablet TAKE 1 TABLET BY MOUTH EVERY DAY 90 tablet 1   traZODone (DESYREL) 50 MG tablet TAKE 0.5-1 TABLETS (25-50 MG TOTAL) BY MOUTH AT BEDTIME AS NEEDED FOR SLEEP. 90 tablet 2   Vitamin D, Ergocalciferol, (DRISDOL) 1.25 MG (50000 UNIT) CAPS capsule TAKE 1 CAPSULE BY MOUTH ONE TIME PER WEEK 12 capsule 3   No facility-administered medications prior to visit.    Allergies  Allergen Reactions   Fish Allergy Nausea And Vomiting   Augmentin [Amoxicillin-Pot Clavulanate] Diarrhea    Can take regular amoxicillin with no issues Has patient had a PCN reaction causing immediate rash, facial/tongue/throat swelling, SOB or lightheadedness with hypotension: No Has patient had a PCN reaction causing severe rash involving mucus membranes or skin necrosis: No Has patient had a PCN reaction that required hospitalization: No Has patient had a PCN reaction occurring within the last 10 years: Yes--but DIARRHEA ONLY If all of the above answers are  "NO", then may proceed with Cephalosporin [place]   Citalopram Other (See Comments)    Tachycardia    Eggs Or Egg-Derived Products Diarrhea and Nausea And Vomiting   Sulfonamide Derivatives Itching    Review of Systems  Constitutional:  Negative.   Respiratory: Negative.    Cardiovascular: Negative.   Endocrine: Negative.   Genitourinary:  Positive for dysuria, frequency and urgency.  Musculoskeletal: Negative.   Skin: Negative.   Neurological: Negative.   Hematological: Negative.   Psychiatric/Behavioral: Negative.    All other systems reviewed and are negative.     Objective:    Physical Exam Vitals reviewed.  Constitutional:      Appearance: Normal appearance.  Eyes:     Extraocular Movements: Extraocular movements intact.     Pupils: Pupils are equal, round, and reactive to light.  Cardiovascular:     Rate and Rhythm: Normal rate and regular rhythm.  Pulmonary:     Effort: Pulmonary effort is normal.     Breath sounds: Normal breath sounds.  Abdominal:     General: Abdomen is flat.     Palpations: Abdomen is soft.  Musculoskeletal:        General: Normal range of motion.     Cervical back: Neck supple.  Skin:    General: Skin is warm and dry.  Neurological:     General: No focal deficit present.     Mental Status: She is alert and oriented to person, place, and time.  Psychiatric:        Mood and Affect: Mood normal.        Behavior: Behavior normal.    BP 100/66   Pulse 74   Temp 98.1 F (36.7 C) (Temporal)   Ht 5\' 2"  (1.575 m)   Wt 136 lb 9.6 oz (62 kg)   SpO2 99%   BMI 24.98 kg/m  Wt Readings from Last 3 Encounters:  07/03/21 136 lb 9.6 oz (62 kg)  06/11/21 134 lb 9.6 oz (61.1 kg)  03/21/21 130 lb 3.2 oz (59.1 kg)    Health Maintenance Due  Topic Date Due   COLONOSCOPY (Pts 45-55yrs Insurance coverage will need to be confirmed)  02/07/2018   Zoster Vaccines- Shingrix (1 of 2) Never done    There are no preventive care reminders to display for  this patient.   Lab Results  Component Value Date   TSH 0.833 06/11/2021   Lab Results  Component Value Date   WBC 5.5 06/11/2021   HGB 13.3 06/11/2021   HCT 40.0 06/11/2021   MCV 96.2 06/11/2021   PLT 263.0 06/11/2021   Lab Results  Component Value Date   NA 138 04/05/2020   K 3.9 04/05/2020   CO2 25 04/05/2020   GLUCOSE 89 04/05/2020   BUN 12 04/05/2020   CREATININE 0.70 04/05/2020   BILITOT 0.3 04/05/2020   ALKPHOS 76 09/05/2019   AST 22 04/05/2020   ALT 30 (H) 04/05/2020   PROT 6.1 04/05/2020   ALBUMIN 4.3 09/05/2019   CALCIUM 8.9 04/05/2020   ANIONGAP 7 09/05/2019   GFR 69.57 04/05/2019   Lab Results  Component Value Date   CHOL 158 06/11/2021   Lab Results  Component Value Date   HDL 58.30 06/11/2021   Lab Results  Component Value Date   LDLCALC 86 06/11/2021   Lab Results  Component Value Date   TRIG 68.0 06/11/2021   Lab Results  Component Value Date   CHOLHDL 3 06/11/2021   Lab Results  Component Value Date   HGBA1C 5.5 06/11/2021       Assessment & Plan:   Problem List Items Addressed This Visit   None Visit Diagnoses     Midline low back pain, unspecified chronicity, unspecified whether sciatica  present    -  Primary   Relevant Orders   POCT Urinalysis Dipstick (Completed)   Urine Culture (Completed)   Dysuria       Urinary frequency            Meds ordered this encounter  Medications   nitrofurantoin, macrocrystal-monohydrate, (MACROBID) 100 MG capsule    Sig: Take 1 capsule (100 mg total) by mouth 2 (two) times daily.    Dispense:  10 capsule    Refill:  0   Call the office if symptoms worsen or persist. Recheck as scheduled and as needed.   Kennyth Arnold, FNP

## 2021-07-15 ENCOUNTER — Other Ambulatory Visit: Payer: Self-pay | Admitting: Internal Medicine

## 2021-07-28 ENCOUNTER — Other Ambulatory Visit: Payer: Self-pay | Admitting: Internal Medicine

## 2021-07-28 DIAGNOSIS — G43019 Migraine without aura, intractable, without status migrainosus: Secondary | ICD-10-CM

## 2021-07-31 ENCOUNTER — Telehealth: Payer: Self-pay

## 2021-07-31 NOTE — Telephone Encounter (Signed)
Pt. Having a procedure done tomorrow by Bonna Gains she would like to know if her colonoscopy is preventative or diagnostic.

## 2021-08-01 ENCOUNTER — Ambulatory Visit: Payer: BC Managed Care – PPO | Admitting: Anesthesiology

## 2021-08-01 ENCOUNTER — Encounter
Admission: RE | Disposition: A | Discharge status: Home / Self Care | Payer: Self-pay | Source: Home / Self Care | Attending: Gastroenterology

## 2021-08-01 ENCOUNTER — Ambulatory Visit
Admission: RE | Admit: 2021-08-01 | Discharge: 2021-08-01 | Disposition: A | Discharge status: Home / Self Care | Payer: BC Managed Care – PPO | Attending: Gastroenterology | Admitting: Gastroenterology

## 2021-08-01 ENCOUNTER — Encounter: Payer: Self-pay | Admitting: Gastroenterology

## 2021-08-01 ENCOUNTER — Other Ambulatory Visit: Payer: Self-pay

## 2021-08-01 DIAGNOSIS — Z1211 Encounter for screening for malignant neoplasm of colon: Secondary | ICD-10-CM | POA: Diagnosis not present

## 2021-08-01 DIAGNOSIS — I10 Essential (primary) hypertension: Secondary | ICD-10-CM | POA: Diagnosis not present

## 2021-08-01 DIAGNOSIS — M797 Fibromyalgia: Secondary | ICD-10-CM | POA: Diagnosis not present

## 2021-08-01 DIAGNOSIS — K648 Other hemorrhoids: Secondary | ICD-10-CM | POA: Insufficient documentation

## 2021-08-01 DIAGNOSIS — Z91013 Allergy to seafood: Secondary | ICD-10-CM | POA: Insufficient documentation

## 2021-08-01 DIAGNOSIS — Z9884 Bariatric surgery status: Secondary | ICD-10-CM | POA: Diagnosis not present

## 2021-08-01 DIAGNOSIS — K219 Gastro-esophageal reflux disease without esophagitis: Secondary | ICD-10-CM | POA: Diagnosis not present

## 2021-08-01 DIAGNOSIS — Z881 Allergy status to other antibiotic agents status: Secondary | ICD-10-CM | POA: Insufficient documentation

## 2021-08-01 DIAGNOSIS — Z860100 Personal history of colon polyps, unspecified: Secondary | ICD-10-CM

## 2021-08-01 DIAGNOSIS — D126 Benign neoplasm of colon, unspecified: Secondary | ICD-10-CM

## 2021-08-01 DIAGNOSIS — Z888 Allergy status to other drugs, medicaments and biological substances status: Secondary | ICD-10-CM | POA: Insufficient documentation

## 2021-08-01 DIAGNOSIS — Z91012 Allergy to eggs: Secondary | ICD-10-CM | POA: Insufficient documentation

## 2021-08-01 DIAGNOSIS — E785 Hyperlipidemia, unspecified: Secondary | ICD-10-CM | POA: Diagnosis not present

## 2021-08-01 DIAGNOSIS — Z79899 Other long term (current) drug therapy: Secondary | ICD-10-CM | POA: Diagnosis not present

## 2021-08-01 DIAGNOSIS — Z882 Allergy status to sulfonamides status: Secondary | ICD-10-CM | POA: Insufficient documentation

## 2021-08-01 HISTORY — PX: COLONOSCOPY WITH PROPOFOL: SHX5780

## 2021-08-01 LAB — POCT PREGNANCY, URINE: Preg Test, Ur: NEGATIVE

## 2021-08-01 SURGERY — COLONOSCOPY WITH PROPOFOL
Anesthesia: General

## 2021-08-01 MED ORDER — SODIUM CHLORIDE 0.9 % IV SOLN: INTRAVENOUS | Status: DC

## 2021-08-01 MED ORDER — PROPOFOL 10 MG/ML IV BOLUS
INTRAVENOUS | Status: DC | PRN
  Administered 2021-08-01: 60 mg via INTRAVENOUS

## 2021-08-01 MED ORDER — PROPOFOL 500 MG/50ML IV EMUL
INTRAVENOUS | Status: AC
  Filled 2021-08-01: qty 50

## 2021-08-01 MED ORDER — PROPOFOL 500 MG/50ML IV EMUL
INTRAVENOUS | Status: DC | PRN
  Administered 2021-08-01: 150 ug/kg/min via INTRAVENOUS

## 2021-08-01 MED ORDER — SIMETHICONE 40 MG/0.6ML PO SUSP
ORAL | Status: DC | PRN
  Administered 2021-08-01: 20 mg

## 2021-08-01 MED ORDER — MIDAZOLAM HCL 2 MG/2ML IJ SOLN
INTRAMUSCULAR | Status: DC | PRN
  Administered 2021-08-01: 2 mg via INTRAVENOUS

## 2021-08-01 MED ORDER — MIDAZOLAM HCL 2 MG/2ML IJ SOLN
INTRAMUSCULAR | Status: AC
  Filled 2021-08-01: qty 2

## 2021-08-01 MED ORDER — LIDOCAINE HCL (CARDIAC) PF 100 MG/5ML IV SOSY
PREFILLED_SYRINGE | INTRAVENOUS | Status: DC | PRN
  Administered 2021-08-01: 50 mg via INTRAVENOUS

## 2021-08-01 NOTE — Transfer of Care (Signed)
Immediate Anesthesia Transfer of Care Note  Patient: Selena [name]  Procedure(s) Performed: COLONOSCOPY WITH PROPOFOL  Patient Location: PACU and Endoscopy Unit  Anesthesia Type:General  Level of Consciousness: drowsy and patient cooperative  Airway & Oxygen Therapy: Patient Spontanous Breathing  Post-op Assessment: Report given to RN and Post -op Vital signs reviewed and stable  Post vital signs: Reviewed and stable  Last Vitals:  Vitals Value Taken Time  BP 94/45 08/01/21 0917  Temp 36.3 C 08/01/21 0917  Pulse 63 08/01/21 0918  Resp 14 08/01/21 0918  SpO2 100 % 08/01/21 0918  Vitals shown include unvalidated device data.  Last Pain:  Vitals:   08/01/21 0917  TempSrc: Temporal  PainSc: Asleep         Complications: No notable events documented.

## 2021-08-01 NOTE — Anesthesia Procedure Notes (Signed)
Procedure Name: MAC Date/Time: 08/01/2021 8:43 AM Performed by: Jerrye Noble, CRNA Pre-anesthesia Checklist: Patient identified, Emergency Drugs available, Suction available and Patient being monitored Patient Re-evaluated:Patient Re-evaluated prior to induction Oxygen Delivery Method: Nasal cannula

## 2021-08-01 NOTE — Anesthesia Postprocedure Evaluation (Signed)
Anesthesia Post Note  Patient: Selena [name]  Procedure(s) Performed: COLONOSCOPY WITH PROPOFOL  Patient location during evaluation: PACU Anesthesia Type: General Level of consciousness: awake and alert, oriented and patient cooperative Pain management: pain level controlled Vital Signs Assessment: post-procedure vital signs reviewed and stable Respiratory status: spontaneous breathing, nonlabored ventilation and respiratory function stable Cardiovascular status: blood pressure returned to baseline and stable Postop Assessment: adequate PO intake Anesthetic complications: no   No notable events documented.   Last Vitals:  Vitals:   08/01/21 0926 08/01/21 0937  BP: (!) 79/65 104/68  Pulse:    Resp:    Temp:    SpO2:      Last Pain:  Vitals:   08/01/21 0937  TempSrc:   PainSc: 0-No pain                 Darrin Nipper

## 2021-08-01 NOTE — Anesthesia Preprocedure Evaluation (Signed)
Anesthesia Evaluation  Patient identified by MRN, date of birth, ID band Patient awake    Reviewed: Allergy & Precautions, NPO status , Patient's Chart, lab work & pertinent test results  History of Anesthesia Complications Negative for: history of anesthetic complications  Airway Mallampati: I   Neck ROM: Full    Dental no notable dental hx.    Pulmonary neg pulmonary ROS,    Pulmonary exam normal breath sounds clear to auscultation       Cardiovascular hypertension, Normal cardiovascular exam Rhythm:Regular Rate:Normal     Neuro/Psych  Headaches, PSYCHIATRIC DISORDERS Anxiety Depression    GI/Hepatic GERD  ,S/p gastric bypass   Endo/Other  negative endocrine ROS  Renal/GU negative Renal ROS     Musculoskeletal  (+) Fibromyalgia -  Abdominal   Peds  Hematology negative hematology ROS (+)   Anesthesia Other Findings   Reproductive/Obstetrics                             Anesthesia Physical Anesthesia Plan  ASA: 2  Anesthesia Plan: General   Post-op Pain Management:    Induction: Intravenous  PONV Risk Score and Plan: 3 and Propofol infusion, TIVA and Treatment may vary due to age or medical condition  Airway Management Planned: Natural Airway  Additional Equipment:   Intra-op Plan:   Post-operative Plan:   Informed Consent: I have reviewed the patients History and Physical, chart, labs and discussed the procedure including the risks, benefits and alternatives for the proposed anesthesia with the patient or authorized representative who has indicated his/her understanding and acceptance.       Plan Discussed with: CRNA  Anesthesia Plan Comments:         Anesthesia Quick Evaluation

## 2021-08-01 NOTE — H&P (Signed)
Selena Antigua, MD [address], [place], [place], [place], [address] [address], [place], [place], [place], [address] Phone: [phone]  Fax: [phone]  Primary Care Physician:  [name], MD   Pre-Procedure History & Physical: HPI:  Selena [name] is a 55 y.o. female is here for a colonoscopy.   Past Medical History:  Diagnosis Date   Anemia, iron deficiency    Anxiety    Asthma    Colon polyps 1829   Complication of anesthesia    per patient report , woke during endoscopy in 2014 ; no issues with subsequent colonscopy also  in 2014    Costochondritis    srturggled wiht it since age 83 ; exacerbates with excessive movement of left arm; did phsycial therapy in 08-2017 and hasnt had issues with it since    Depression    Eating disorder    Binge eating   Fibromyalgia    Gallstones    GERD (gastroesophageal reflux disease)    Heart murmur    at birth; resolved    Hyperlipidemia    Hypertension    was due to birth control  , " as soon as they took it out, it went away  "     Past Surgical History:  Procedure Laterality Date   CHOLECYSTECTOMY     COLONOSCOPY     [facility] dr Deatra Ina    EVALUATION UNDER ANESTHESIA WITH HEMORRHOIDECTOMY N/A 06/24/2018   Procedure: EXAM UNDER ANESTHESIA WITH EXTERNAL HEMORRHOIDECTOMY;  Surgeon: Johnathan Hausen, MD;  Location: [facility];  Service: General;  Laterality: N/A;   EXPLORATORY LAPAROTOMY  2000   for infertility   GANGLION CYST EXCISION     GASTRIC ROUX-EN-Y N/A 01/24/2018   Procedure: LAPAROSCOPIC ROUX-EN-Y GASTRIC BYPASS WITH UPPER ENDOSCOPY;  Surgeon: Johnathan Hausen, MD;  Location: [facility];  Service: General;  Laterality: N/A;   SPHINCTEROTOMY N/A 06/24/2018   Procedure: LATERAL INTERNAL SPHINCTEROTOMY;  Surgeon: Johnathan Hausen, MD;  Location: [facility];  Service: General;  Laterality: N/A;   [place]    Prior to Admission medications   Medication Sig Start Date End Date Taking? Authorizing  Provider  calcium gluconate 500 MG tablet Take 1 tablet by mouth 2 (two) times daily.    Yes [provider]  clonazePAM (KLONOPIN) 0.5 MG tablet Take 1 tablet (0.5 mg total) by mouth daily as needed for anxiety. 04/29/21  Yes [name], MD  diclofenac Sodium (VOLTAREN) 1 % GEL Apply 2 g topically 4 (four) times daily. 06/11/21  Yes [name], MD  FLUoxetine (PROZAC) 20 MG tablet TAKE 3 TABLETS BY MOUTH EVERY DAY 05/23/21  Yes [name], MD  levonorgestrel (MIRENA) 20 MCG/24HR IUD 1 Intra Uterine Device (1 each total) by Intrauterine route once. 10/30/12  Yes Janith Lima, MD  Multiple Vitamins-Iron (MULTI-VITAMIN/IRON PO) Take 1 tablet by mouth daily.    Yes [provider]  pramipexole (MIRAPEX) 0.125 MG tablet TAKE 1 TABLET BY MOUTH DAILY AT BEDTIME. INCREASE WEEKLY AS NEEDED 03/25/20  Yes [name], MD  rizatriptan (MAXALT) 10 MG tablet TAKE 1 TABLET BY MOUTH AS NEEDED FOR MIGRAINE. MAY REPEAT IN 2 HOURS IF NEEDED 07/28/21  Yes [name], MD  tiZANidine (ZANAFLEX) 4 MG tablet Take 1 tablet (4 mg total) by mouth every 6 (six) hours as needed for muscle spasms. 06/11/21  Yes [name], MD  traZODone (DESYREL) 50 MG tablet TAKE 0.5-1 TABLETS (25-50 MG TOTAL) BY MOUTH  AT BEDTIME AS NEEDED FOR SLEEP. 02/10/21  Yes [name], MD  Vitamin D, Ergocalciferol, (DRISDOL) 1.25 MG (50000 UNIT) CAPS capsule TAKE 1 CAPSULE BY MOUTH ONE TIME PER WEEK 07/15/21  Yes [name], MD  estradiol (VIVELLE-DOT) 0.025 MG/24HR Place 1 patch onto the skin 2 (two) times a week. 05/06/21   [provider]  nitrofurantoin, macrocrystal-monohydrate, (MACROBID) 100 MG capsule Take 1 capsule (100 mg total) by mouth 2 (two) times daily. Patient not taking: Reported on 08/01/2021 07/03/21   Kennyth Arnold, FNP  omeprazole (PRILOSEC OTC) 20 MG tablet Take 20 mg by mouth daily before breakfast.     [provider]  ondansetron (ZOFRAN ODT) 4 MG disintegrating  tablet Take 1 tablet (4 mg total) by mouth every 8 (eight) hours as needed for nausea or vomiting. Patient not taking: Reported on 08/01/2021 05/20/20   [name], MD  topiramate (TOPAMAX) 100 MG tablet TAKE 1 TABLET BY MOUTH EVERY DAY 01/15/21   [name], MD    Allergies as of 06/16/2021 - Review Complete 03/21/2021  Allergen Reaction Noted   Fish allergy Nausea And Vomiting 10/04/2017   Augmentin [amoxicillin-pot clavulanate] Diarrhea 08/08/2016   Citalopram Other (See Comments) 06/05/2020   Eggs or egg-derived products Diarrhea and Nausea And Vomiting 06/22/2016   Sulfonamide derivatives Itching     Family History  Problem Relation Age of Onset   Hyperlipidemia Mother    Heart disease Mother        Atrial fibrilation   Cancer Mother        ? Melanoma   Atrial fibrillation Mother    Diabetes Father    Mental illness Father        Bipolar   Leukemia Father 33       MDS< then leukemia    Cancer Sister        Clear cell sarcoma in leg   Hypothyroidism Sister    Hypertension Maternal Grandmother    Heart disease Maternal Grandmother        Afib   Arrhythmia Maternal Grandmother    Arthritis Paternal Grandmother    Arrhythmia Maternal Grandfather    Colon cancer Neg Hx    Rectal cancer Neg Hx    Pancreatic cancer Neg Hx     Social History   Socioeconomic History   Marital status: Single    Spouse name: Not on file   Number of children: 1   Years of education: Not on file   Highest education level: Not on file  Occupational History   Occupation: Teaches preschool    Employer: [employer]  Tobacco Use   Smoking status: Never   Smokeless tobacco: Never  Vaping Use   Vaping Use: Never used  Substance and Sexual Activity   Alcohol use: Yes    Comment: occasional   Drug use: No   Sexual activity: Yes    Birth control/protection: I.U.D.  Other Topics Concern   Not on file  Social History Narrative   2 Step children; asthma, ADHD   Social  Determinants of Health   Financial Resource Strain: Not on file  Food Insecurity: Not on file  Transportation Needs: Not on file  Physical Activity: Not on file  Stress: Not on file  Social Connections: Not on file  Intimate Partner Violence: Not on file    Review of Systems: See HPI, otherwise negative ROS  Physical Exam: Constitutional: General:   Alert,  Well-developed, well-nourished, pleasant and  cooperative in NAD BP 112/72   Pulse 65   Temp 98.3 F (36.8 C) (Temporal)   Resp 18   Ht 5\' 2"  (1.575 m)   Wt 60.3 kg   SpO2 98%   BMI 24.33 kg/m   Head: Normocephalic, atraumatic.   Eyes:  Sclera clear, no icterus.   Conjunctiva pink.   Mouth:  No deformity or lesions, oropharynx pink & moist.  Neck:  Supple, trachea midline  Respiratory: Normal respiratory effort  Gastrointestinal:  Soft, non-tender and non-distended without masses, hepatosplenomegaly or hernias noted.  No guarding or rebound tenderness.     Cardiac: No clubbing or edema.  No cyanosis. Normal posterior tibial pedal pulses noted.  Lymphatic:  No significant cervical adenopathy.  Psych:  Alert and cooperative. Normal mood and affect.  Musculoskeletal:   Symmetrical without gross deformities. 5/5 Lower extremity strength bilaterally.  Skin: Warm. Intact without significant lesions or rashes. No jaundice.  Neurologic:  Face symmetrical, tongue midline, Normal sensation to touch;  grossly normal neurologically.  Psych:  Alert and oriented x3, Alert and cooperative. Normal mood and affect.  Impression/Plan: Selena [name] is here for a colonoscopy to be performed for history of tubular adenoma polyp in 2014  Risks, benefits, limitations, and alternatives regarding  colonoscopy have been reviewed with the patient.  Questions have been answered.  All parties agreeable.   Virgel Manifold, MD  08/01/2021, 8:41 AM

## 2021-08-01 NOTE — Op Note (Addendum)
[facility] Patient Name: Selena [name] Procedure Date: 08/01/2021 8:34 AM MRN: [id number] Account #: [id number] Date of Birth: 01-Nov-1965 Admit Type: Outpatient Age: 55 Room: [facility] ENDO ROOM 2 Gender: Female Note Status: Finalized Instrument Name: Jasper Riling [id number] Procedure:             Colonoscopy Indications:           High risk colon cancer surveillance: Personal history                         of colonic polyps Providers:             Brylen Wagar B. Bonna Gains MD, MD Medicines:             Monitored Anesthesia Care Complications:         No immediate complications. Procedure:             Pre-Anesthesia Assessment:                        - Prior to the procedure, a History and Physical was                         performed, and patient medications, allergies and                         sensitivities were reviewed. The patient's tolerance                         of previous anesthesia was reviewed.                        - The risks and benefits of the procedure and the                         sedation options and risks were discussed with the                         patient. All questions were answered and informed                         consent was obtained.                        - Patient identification and proposed procedure were                         verified prior to the procedure by the physician, the                         nurse, the anesthetist and the technician. The                         procedure was verified in the pre-procedure area in                         the procedure room in the endoscopy suite.                        - ASA Grade Assessment: II - A patient with mild  systemic disease.                        - After reviewing the risks and benefits, the patient                         was deemed in satisfactory condition to undergo the                         procedure.                         After obtaining informed consent, the colonoscope was                         passed under direct vision. Throughout the procedure,                         the patient's blood pressure, pulse, and oxygen                         saturations were monitored continuously. The                         Colonoscope was introduced through the anus and                         advanced to the the cecum, identified by appendiceal                         orifice and ileocecal valve. The colonoscopy was                         performed with ease. The patient tolerated the                         procedure well. The quality of the bowel preparation                         was fair. It was cleaned with suction and water Findings:      The perianal and digital rectal examinations were normal.      The rectum, sigmoid colon, descending colon, transverse colon, ascending       colon and cecum appeared normal.      Non-bleeding internal hemorrhoids were found during retroflexion.      No additional abnormalities were found on retroflexion. Impression:            - Preparation of the colon was fair.                        - The rectum, sigmoid colon, descending colon,                         transverse colon, ascending colon and cecum are normal.                        - Non-bleeding internal hemorrhoids.                        - No specimens  collected. Recommendation:        - Discharge patient to home.                        - High fiber diet.                        - Continue present medications.                        - Repeat colonoscopy in 3 years, with 2 day prep,                         because the bowel preparation was suboptimal. (Colon                         was cleaned with suction and water. Due to the fair                         prep, which was able to be partially cleaned, repeat                         colonoscopy is being recommended in 3 years. Since she                         had a  colonoscopy in 2014 already and visualization                         was fair on this one, a repeat colonoscopy earlier                         than 3 years is not needed).                        - Return to primary care physician as previously                         scheduled.                        - The findings and recommendations were discussed with                         the patient.                        - The findings and recommendations were discussed with                         the patient's family. Procedure Code(s):     --- Professional ---                        [phone], Colonoscopy, flexible; diagnostic, including                         collection of specimen(s) by brushing or washing, when                         performed (separate procedure) Diagnosis Code(s):     ---  Professional ---                        Z86.010, Personal history of colonic polyps CPT copyright 2019 American Medical Association. All rights reserved. The codes documented in this report are preliminary and upon coder review may  be revised to meet current compliance requirements.  Vonda Antigua, MD Margretta Sidle B. Bonna Gains MD, MD 08/01/2021 9:17:37 AM This report has been signed electronically. Number of Addenda: 0 Note Initiated On: 08/01/2021 8:34 AM Scope Withdrawal Time: 0 hours 21 minutes 42 seconds  Total Procedure Duration: 0 hours 25 minutes 46 seconds       [facility]

## 2021-08-04 ENCOUNTER — Encounter: Payer: Self-pay | Admitting: Gastroenterology

## 2021-08-04 NOTE — Telephone Encounter (Signed)
Selena [name] is a few months behind on their referrals.  I can change the referral to [facility] clinic, but I cannot guarantee when they will be able to see you either. Let me know what you would like ot do

## 2021-08-11 ENCOUNTER — Other Ambulatory Visit: Payer: Self-pay | Admitting: Internal Medicine

## 2021-08-21 ENCOUNTER — Encounter (HOSPITAL_COMMUNITY): Payer: Self-pay | Admitting: *Deleted

## 2021-10-06 ENCOUNTER — Encounter: Payer: Self-pay | Admitting: Internal Medicine

## 2021-10-06 DIAGNOSIS — F419 Anxiety disorder, unspecified: Secondary | ICD-10-CM

## 2021-11-14 ENCOUNTER — Other Ambulatory Visit: Payer: Self-pay | Admitting: Internal Medicine

## 2021-11-21 ENCOUNTER — Telehealth: Payer: BC Managed Care – PPO | Admitting: Physician Assistant

## 2021-11-21 DIAGNOSIS — J019 Acute sinusitis, unspecified: Secondary | ICD-10-CM

## 2021-11-21 DIAGNOSIS — B9689 Other specified bacterial agents as the cause of diseases classified elsewhere: Secondary | ICD-10-CM

## 2021-11-21 MED ORDER — DOXYCYCLINE HYCLATE 100 MG PO TABS: 100.0000 mg | ORAL_TABLET | Freq: Two times a day (BID) | ORAL | 0 refills | Status: DC

## 2021-11-21 NOTE — Progress Notes (Signed)

## 2021-11-24 ENCOUNTER — Other Ambulatory Visit: Payer: Self-pay | Admitting: Internal Medicine

## 2021-11-24 DIAGNOSIS — G43019 Migraine without aura, intractable, without status migrainosus: Secondary | ICD-10-CM

## 2021-12-10 ENCOUNTER — Encounter: Payer: Self-pay | Admitting: Internal Medicine

## 2021-12-10 ENCOUNTER — Ambulatory Visit: Payer: BC Managed Care – PPO | Admitting: Internal Medicine

## 2021-12-10 ENCOUNTER — Other Ambulatory Visit: Payer: Self-pay

## 2021-12-10 VITALS — BP 94/58 | HR 89 | Temp 98.4°F | Ht 62.0 in | Wt 144.4 lb

## 2021-12-10 DIAGNOSIS — I10 Essential (primary) hypertension: Secondary | ICD-10-CM

## 2021-12-10 DIAGNOSIS — R5383 Other fatigue: Secondary | ICD-10-CM | POA: Diagnosis not present

## 2021-12-10 DIAGNOSIS — N3944 Nocturnal enuresis: Secondary | ICD-10-CM

## 2021-12-10 DIAGNOSIS — I700 Atherosclerosis of aorta: Secondary | ICD-10-CM

## 2021-12-10 DIAGNOSIS — R7301 Impaired fasting glucose: Secondary | ICD-10-CM | POA: Diagnosis not present

## 2021-12-10 DIAGNOSIS — E782 Mixed hyperlipidemia: Secondary | ICD-10-CM | POA: Diagnosis not present

## 2021-12-10 DIAGNOSIS — R638 Other symptoms and signs concerning food and fluid intake: Secondary | ICD-10-CM

## 2021-12-10 NOTE — Patient Instructions (Signed)
Good to see you! ? ? ?PLEASE consider going back to [facility] for reaffirmation and support and TOOLS ? ?I will look into the possibility of mirapex being the culprit for your bedwetting and compulsive eatig  ?

## 2021-12-10 NOTE — Progress Notes (Signed)
? ?Subjective:  ?Patient ID: Selena [name], female    DOB: 06-15-1966  Age: 56 y.o. MRN: [id number] ? ?CC: The primary encounter diagnosis was Mixed hyperlipidemia. Diagnoses of Fatigue, unspecified type, Hypertension, unspecified type, Impaired fasting glucose, Craving for particular food, Nocturnal enuresis, and Aortic arch atherosclerosis ([place]) were also pertinent to this visit. ? ? ?This visit occurred during the SARS-CoV-2 public health emergency.  Safety protocols were in place, including screening questions prior to the visit, additional usage of staff PPE, and extensive cleaning of exam room while observing appropriate contact time as indicated for disinfecting solutions.   ? ?HPI ?Selena [name] presents for  ?Chief Complaint  ?Patient presents with  ? Follow-up  ?  6 month follow up  ? ?1) aortic atherosclerosis: noted on 07/2019  abdominal  CT   mild  ? ?2) GAD/depression: developed insomnia , worsening symptoms,  requested psych referral to [place].  Referral authorizied but has not been seen:  she has not filled out the paperwork.  Sleeping better with a routine.  Was Spinning at home (bike)  until she had a recent episode of sinusitis.  Has not spun in 2 weeks.   ? ?3)  she is s/p bariatric surgery at the 3 year mark:  gaining weight, hungry all the time ,  craving strange things,  often things she ate as a teenager.  Tried treating menopause is not helping . ? ?4)  Headaches improving ? ?5) Nocturnal bed wetting has occurred 3 times. Since her last visit.   Unaware,  no sensation.  Wakes up for other reasons ( bc dog barks)  and notices that the bedclothes and bed are saturated.  Bedtime medications :  topomax,  mirapex,  magnesium, and stool softener . Sleeping very well,  having dreams. ? ? ?Outpatient Medications Prior to Visit  ?Medication Sig Dispense Refill  ? calcium gluconate 500 MG tablet Take 1 tablet by mouth 2 (two) times daily.     ? clonazePAM (KLONOPIN) 0.5 MG tablet TAKE 1 TABLET  BY MOUTH EVERY DAY AS NEEDED FOR ANXIETY 30 tablet 0  ? diclofenac Sodium (VOLTAREN) 1 % GEL Apply 2 g topically 4 (four) times daily. 100 g 5  ? estradiol (VIVELLE-DOT) 0.025 MG/24HR Place 1 patch onto the skin 2 (two) times a week.    ? FLUoxetine (PROZAC) 20 MG tablet TAKE 3 TABLETS BY MOUTH EVERY DAY 270 tablet 2  ? levonorgestrel (MIRENA) 20 MCG/24HR IUD 1 Intra Uterine Device (1 each total) by Intrauterine route once. 1 each 0  ? Multiple Vitamins-Iron (MULTI-VITAMIN/IRON PO) Take 1 tablet by mouth daily.     ? omeprazole (PRILOSEC OTC) 20 MG tablet Take 20 mg by mouth daily before breakfast.     ? ondansetron (ZOFRAN ODT) 4 MG disintegrating tablet Take 1 tablet (4 mg total) by mouth every 8 (eight) hours as needed for nausea or vomiting. 20 tablet 0  ? pramipexole (MIRAPEX) 0.125 MG tablet TAKE 1 TABLET BY MOUTH DAILY AT BEDTIME. INCREASE WEEKLY AS NEEDED 90 tablet 1  ? rizatriptan (MAXALT) 10 MG tablet TAKE 1 TABLET BY MOUTH AS NEEDED FOR MIGRAINE. MAY REPEAT IN 2 HOURS IF NEEDED 10 tablet 2  ? tiZANidine (ZANAFLEX) 4 MG tablet Take 1 tablet (4 mg total) by mouth every 6 (six) hours as needed for muscle spasms. 30 tablet 0  ? topiramate (TOPAMAX) 100 MG tablet TAKE 1 TABLET BY MOUTH EVERY DAY 90 tablet 1  ? traZODone (DESYREL) 50 MG tablet TAKE  0.5-1 TABLETS (25-50 MG TOTAL) BY MOUTH AT BEDTIME AS NEEDED FOR SLEEP. 90 tablet 2  ? Vitamin D, Ergocalciferol, (DRISDOL) 1.25 MG (50000 UNIT) CAPS capsule TAKE 1 CAPSULE BY MOUTH ONE TIME PER WEEK 12 capsule 3  ? doxycycline (VIBRA-TABS) 100 MG tablet Take 1 tablet (100 mg total) by mouth 2 (two) times daily. (Patient not taking: Reported on 12/10/2021) 20 tablet 0  ? ?No facility-administered medications prior to visit.  ? ? ?Review of Systems; ? ?Patient denies headache, fevers, malaise, unintentional weight loss, skin rash, eye pain, sinus congestion and sinus pain, sore throat, dysphagia,  hemoptysis , cough, dyspnea, wheezing, chest pain, palpitations,  orthopnea, edema, abdominal pain, nausea, melena, diarrhea, constipation, flank pain, dysuria, hematuria, urinary  Frequency, nocturia, numbness, tingling, seizures,  Focal weakness, Loss of consciousness,  Tremor, insomnia, depression, anxiety, and suicidal ideation.   ? ? ? ?Objective:  ?BP (!) 94/58 (BP Location: Left Arm, Patient Position: Sitting, Cuff Size: Normal)   Pulse 89   Temp 98.4 ?F (36.9 ?C) (Oral)   Ht _0  (1.575 m)   Wt 144 lb 6.4 oz (65.5 kg)   SpO2 97%   BMI 26.41 kg/m?  ? ?BP Readings from Last 3 Encounters:  ?12/10/21 (!) 94/58  ?08/01/21 104/68  ?07/03/21 100/66  ? ? ?Wt Readings from Last 3 Encounters:  ?12/10/21 144 lb 6.4 oz (65.5 kg)  ?08/01/21 133 lb (60.3 kg)  ?07/03/21 136 lb 9.6 oz (62 kg)  ? ? ?General appearance: alert, cooperative and appears stated age ?Ears: normal TM's and external ear canals both ears ?Throat: lips, mucosa, and tongue normal; teeth and gums normal ?Neck: no adenopathy, no carotid bruit, supple, symmetrical, trachea midline and thyroid not enlarged, symmetric, no tenderness/mass/nodules ?Back: symmetric, no curvature. ROM normal. No CVA tenderness. ?Lungs: clear to auscultation bilaterally ?Heart: regular rate and rhythm, S1, S2 normal, no murmur, click, rub or gallop ?Abdomen: soft, non-tender; bowel sounds normal; no masses,  no organomegaly ?Pulses: 2+ and symmetric ?Skin: Skin color, texture, turgor normal. No rashes or lesions ?Lymph nodes: Cervical, supraclavicular, and axillary nodes normal. ? ?Lab Results  ?Component Value Date  ? HGBA1C 5.5 12/10/2021  ? HGBA1C 5.5 06/11/2021  ? HGBA1C 5.7 05/06/2018  ? ? ?Lab Results  ?Component Value Date  ? CREATININE 0.83 12/10/2021  ? CREATININE 0.70 04/05/2020  ? CREATININE 0.78 09/05/2019  ? ? ?Lab Results  ?Component Value Date  ? WBC 5.6 12/10/2021  ? HGB 13.4 12/10/2021  ? HCT 39.8 12/10/2021  ? PLT 298.0 12/10/2021  ? GLUCOSE 101 (H) 12/10/2021  ? CHOL 158 06/11/2021  ? TRIG 68.0 06/11/2021  ? HDL 58.30  06/11/2021  ? LDLDIRECT 171 (H) 05/21/2016  ? [place] 86 06/11/2021  ? ALT 70 (H) 12/10/2021  ? AST 45 (H) 12/10/2021  ? NA 137 12/10/2021  ? K 3.7 12/10/2021  ? CL 107 12/10/2021  ? CREATININE 0.83 12/10/2021  ? BUN 27 (H) 12/10/2021  ? CO2 22 12/10/2021  ? TSH 0.95 12/10/2021  ? INR 1.0 07/13/2008  ? HGBA1C 5.5 12/10/2021  ? MICROALBUR <0.7 12/10/2021  ? ? ?No results found. ? ?Assessment & Plan:  ? ?Problem List Items Addressed This Visit   ? ? Aortic arch atherosclerosis ([place])  ?  Mild , distal noted on 2020 CT abdomen.  Her untreated LDL was 86 in Sept 2022.  No treatment needed  ? ?Lab Results  ?Component Value Date  ? CHOL 158 06/11/2021  ? HDL 58.30 06/11/2021  ?  [place] 86 06/11/2021  ? LDLDIRECT 171 (H) 05/21/2016  ? TRIG 68.0 06/11/2021  ? CHOLHDL 3 06/11/2021  ? ? ?  ?  ? Craving for particular food  ?  Accompanied by insatiable cravings for other people's lunches and foods she hasn't eaten since adolescence.  Discussed tapering off of Mirapex andif no improvement,  Return to [facility]  ?  ?  ? Fatigue  ? Relevant Orders  ? TSH (Completed)  ? CBC with Differential/Platelet (Completed)  ? Hyperlipidemia - Primary  ? Nocturnal enuresis  ?  3 recent episodes reported..  May be medication side effect causing hypersomnolence.  Will taper off of mirapex  ?  ?  ? ?Other Visit Diagnoses   ? ? Hypertension, unspecified type      ? Relevant Orders  ? Comp Met (CMET) (Completed)  ? Urine Microalbumin w/creat. ratio (Completed)  ? Impaired fasting glucose      ? Relevant Orders  ? Hemoglobin A1c (Completed)  ? ?  ? ? ?I spent 30 minutes dedicated to the care of this patient on the date of this encounter to include pre-visit review of patient's medical history,  most recent imaging studies, Face-to-face time with the patient , and post visit ordering of testing and therapeutics.   ? ?Follow-up: Return in about 6 months (around 06/12/2022). ? ? ?[name], MD ?

## 2021-12-11 ENCOUNTER — Other Ambulatory Visit: Payer: Self-pay | Admitting: Internal Medicine

## 2021-12-11 DIAGNOSIS — R638 Other symptoms and signs concerning food and fluid intake: Secondary | ICD-10-CM | POA: Insufficient documentation

## 2021-12-11 DIAGNOSIS — I700 Atherosclerosis of aorta: Secondary | ICD-10-CM | POA: Insufficient documentation

## 2021-12-11 DIAGNOSIS — N3944 Nocturnal enuresis: Secondary | ICD-10-CM

## 2021-12-11 LAB — CBC WITH DIFFERENTIAL/PLATELET
Basophils Absolute: 0.1 10*3/uL (ref 0.0–0.1)
Basophils Relative: 1.1 % (ref 0.0–3.0)
Eosinophils Absolute: 0.2 10*3/uL (ref 0.0–0.7)
Eosinophils Relative: 3 % (ref 0.0–5.0)
HCT: 39.8 % (ref 36.0–46.0)
Hemoglobin: 13.4 g/dL (ref 12.0–15.0)
Lymphocytes Relative: 42.2 % (ref 12.0–46.0)
Lymphs Abs: 2.4 10*3/uL (ref 0.7–4.0)
MCHC: 33.7 g/dL (ref 30.0–36.0)
MCV: 95.3 fl (ref 78.0–100.0)
Monocytes Absolute: 0.4 10*3/uL (ref 0.1–1.0)
Monocytes Relative: 6.4 % (ref 3.0–12.0)
Neutro Abs: 2.6 10*3/uL (ref 1.4–7.7)
Neutrophils Relative %: 47.3 % (ref 43.0–77.0)
Platelets: 298 10*3/uL (ref 150.0–400.0)
RBC: 4.17 Mil/uL (ref 3.87–5.11)
RDW: 12.8 % (ref 11.5–15.5)
WBC: 5.6 10*3/uL (ref 4.0–10.5)

## 2021-12-11 LAB — TSH: TSH: 0.95 u[IU]/mL (ref 0.35–5.50)

## 2021-12-11 LAB — COMPREHENSIVE METABOLIC PANEL
ALT: 70 U/L — ABNORMAL HIGH (ref 0–35)
AST: 45 U/L — ABNORMAL HIGH (ref 0–37)
Albumin: 4.2 g/dL (ref 3.5–5.2)
Alkaline Phosphatase: 59 U/L (ref 39–117)
BUN: 27 mg/dL — ABNORMAL HIGH (ref 6–23)
CO2: 22 mEq/L (ref 19–32)
Calcium: 8.9 mg/dL (ref 8.4–10.5)
Chloride: 107 mEq/L (ref 96–112)
Creatinine, Ser: 0.83 mg/dL (ref 0.40–1.20)
GFR: 80.35 mL/min (ref >60.00)
Glucose, Bld: 101 mg/dL — ABNORMAL HIGH (ref 70–99)
Potassium: 3.7 mEq/L (ref 3.5–5.1)
Sodium: 137 mEq/L (ref 135–145)
Total Bilirubin: 0.3 mg/dL (ref 0.2–1.2)
Total Protein: 6.5 g/dL (ref 6.0–8.3)

## 2021-12-11 LAB — HEMOGLOBIN A1C: Hgb A1c MFr Bld: 5.5 % (ref 4.6–6.5)

## 2021-12-11 LAB — MICROALBUMIN / CREATININE URINE RATIO
Creatinine,U: 57.7 mg/dL
Microalb Creat Ratio: 1.2 mg/g (ref 0.0–30.0)
Microalb, Ur: <0.7 mg/dL (ref 0.0–1.9)

## 2021-12-11 NOTE — Assessment & Plan Note (Signed)
3 recent episodes reported..  May be medication side effect causing hypersomnolence.  Will taper off of mirapex  ?

## 2021-12-11 NOTE — Assessment & Plan Note (Signed)
ddx includes seizures and hypersomnolence.  If episodes continue after weaning off mirapex , will need neurology evaluation  ?

## 2021-12-11 NOTE — Assessment & Plan Note (Addendum)
Mild , distal noted on 2020 CT abdomen.  Her untreated LDL was 86 in Sept 2022.  No treatment needed  ? ?Lab Results  ?Component Value Date  ? CHOL 158 06/11/2021  ? HDL 58.30 06/11/2021  ? [place] 86 06/11/2021  ? LDLDIRECT 171 (H) 05/21/2016  ? TRIG 68.0 06/11/2021  ? CHOLHDL 3 06/11/2021  ? ? ?

## 2021-12-11 NOTE — Progress Notes (Signed)
My Chart message sent

## 2021-12-11 NOTE — Assessment & Plan Note (Signed)
Accompanied by insatiable cravings for other people's lunches and foods she hasn't eaten since adolescence.  Discussed tapering off of Mirapex andif no improvement,  Return to [facility]  ?

## 2022-01-08 ENCOUNTER — Other Ambulatory Visit: Payer: Self-pay | Admitting: Internal Medicine

## 2022-01-08 NOTE — Telephone Encounter (Signed)
Pt need refill on clonazepam sent [facility] in [place] and she said the provider wanted to come back for a lab appt in three weeks but there are no lab orders in. ?

## 2022-01-08 NOTE — Telephone Encounter (Signed)
Refilled: 11/14/2021 ?Last OV: 12/10/2021 ?Next OV: 06/12/2022 ? ? ?Do not see the additional lab orders ?

## 2022-01-11 ENCOUNTER — Other Ambulatory Visit: Payer: Self-pay | Admitting: Internal Medicine

## 2022-01-11 DIAGNOSIS — R748 Abnormal levels of other serum enzymes: Secondary | ICD-10-CM

## 2022-01-11 MED ORDER — CLONAZEPAM 0.5 MG PO TABS: ORAL_TABLET | ORAL | 2 refills | Status: DC

## 2022-01-13 ENCOUNTER — Other Ambulatory Visit (INDEPENDENT_AMBULATORY_CARE_PROVIDER_SITE_OTHER): Payer: BC Managed Care – PPO

## 2022-01-13 DIAGNOSIS — R748 Abnormal levels of other serum enzymes: Secondary | ICD-10-CM

## 2022-01-14 ENCOUNTER — Telehealth: Payer: Self-pay | Admitting: *Deleted

## 2022-01-14 LAB — COMPREHENSIVE METABOLIC PANEL
ALT: 45 U/L — ABNORMAL HIGH (ref 0–35)
AST: 30 U/L (ref 0–37)
Albumin: 4 g/dL (ref 3.5–5.2)
Alkaline Phosphatase: 59 U/L (ref 39–117)
BUN: 11 mg/dL (ref 6–23)
CO2: 28 mEq/L (ref 19–32)
Calcium: 8.6 mg/dL (ref 8.4–10.5)
Chloride: 106 mEq/L (ref 96–112)
Creatinine, Ser: 0.69 mg/dL (ref 0.40–1.20)
GFR: 98.85 mL/min (ref >60.00)
Glucose, Bld: 42 mg/dL — CL (ref 70–99)
Potassium: 3.6 mEq/L (ref 3.5–5.1)
Sodium: 140 mEq/L (ref 135–145)
Total Bilirubin: 0.3 mg/dL (ref 0.2–1.2)
Total Protein: 5.9 g/dL — ABNORMAL LOW (ref 6.0–8.3)

## 2022-01-14 NOTE — Telephone Encounter (Signed)
CRITICAL VALUE STICKER ? ?CRITICAL VALUE: GLUCOSE-42 (L) ? ?RECEIVER (on-site recipient of call): Jari Favre, CMA/XT ? ?DATE & TIME NOTIFIED: 01/14/22 @ 11am ? ?MESSENGER (representative from lab): Sau ? ?MD NOTIFIED: Dr. Derrel Nip ? ?TIME OF NOTIFICATION:11:15am ? ?RESPONSE:   ?

## 2022-01-15 NOTE — Progress Notes (Signed)
? ? ?Name: Selena [name]  ?MRN/ DOB: [id number], 1966/09/30    ?Age/ Sex: 56 y.o., female   ? ?PCP: [name], MD   ?Reason for Endocrinology Evaluation: Pituitary adenoma  ?   ?Date of Initial Endocrinology Evaluation: 01/16/2022   ? ? ?HPI: ?Selena [name] is a 56 y.o. female with a past medical history of  GAD, S/P gastric bypass 2017. The patient presented for initial endocrinology clinic visit on 01/16/2022 for consultative assistance with her pituitary adenoma.  ? ?She was diagnosed with pituitary adenoma many years ago during evaluation for migraine headaches.  ? ?She has been gaining weight , recent 20 lbs  ?She has hot flashes , increased cravings ?She is on estrogen patch as well as a Mirena ?She has chronic constipation  ?Denies change in shoe or ring size  ?Has excessive sweating  ?She has had enuresis x3 over the past 4 months, typically she wakes up once at night to urinate ? ? ?She has noted mastalgia but no nipple discharge  ? ?She had been noted with serum glucose of 42 mg /dL on 01/13/2022 ?She does endorse episodes of feeling hungry , shaky and jittery as well sweating  during the day . Usually eats almonds to relieve her symptoms  ? ? ? ? ?HISTORY:  ?Past Medical History:  ?Past Medical History:  ?Diagnosis Date  ? Anemia, iron deficiency   ? Anxiety   ? Asthma   ? Colon polyps 2014  ? Complication of anesthesia   ? per patient report , woke during endoscopy in 2014 ; no issues with subsequent colonscopy also  in 2014   ? Costochondritis   ? srturggled wiht it since age 44 ; exacerbates with excessive movement of left arm; did phsycial therapy in 08-2017 and hasnt had issues with it since   ? Depression   ? Eating disorder   ? Binge eating  ? Fibromyalgia   ? Gallstones   ? GERD (gastroesophageal reflux disease)   ? Heart murmur   ? at birth; resolved   ? Hyperlipidemia   ? Hypertension   ? was due to birth control  , " as soon as they took it out, it went away  "   ? ?Past Surgical  History:  ?Past Surgical History:  ?Procedure Laterality Date  ? CHOLECYSTECTOMY    ? COLONOSCOPY    ? [facility] dr Deatra Ina   ? COLONOSCOPY WITH PROPOFOL N/A 08/01/2021  ? Procedure: COLONOSCOPY WITH PROPOFOL;  Surgeon: Virgel Manifold, MD;  Location: [facility] ENDOSCOPY;  Service: Endoscopy;  Laterality: N/A;  ? EVALUATION UNDER ANESTHESIA WITH HEMORRHOIDECTOMY N/A 06/24/2018  ? Procedure: EXAM UNDER ANESTHESIA WITH EXTERNAL HEMORRHOIDECTOMY;  Surgeon: Johnathan Hausen, MD;  Location: [facility] ORS;  Service: General;  Laterality: N/A;  ? 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 GANGLION CYST EXCISION    ? GASTRIC ROUX-EN-Y N/A 01/24/2018  ? Procedure: LAPAROSCOPIC ROUX-EN-Y GASTRIC BYPASS WITH UPPER ENDOSCOPY;  Surgeon: Johnathan Hausen, MD;  Location: [facility] ORS;  Service: General;  Laterality: N/A;  ? SPHINCTEROTOMY N/A 06/24/2018  ? Procedure: LATERAL INTERNAL SPHINCTEROTOMY;  Surgeon: Johnathan Hausen, MD;  Location: [facility] ORS;  Service: General;  Laterality: N/A;  ? TONSILLECTOMY  1989  ?  ?Social History:  reports that she has never smoked. She has never used smokeless tobacco. She reports current alcohol use. She reports that she does not use drugs. ?Family History: family history includes Arrhythmia in her maternal grandfather and maternal  grandmother; Arthritis in her paternal grandmother; Atrial fibrillation in her mother; Cancer in her mother and sister; Diabetes in her father; Heart disease in her maternal grandmother and mother; Hyperlipidemia in her mother; Hypertension in her maternal grandmother; Hypothyroidism in her sister; Leukemia (age of onset: 83) in her father; Mental illness in her father. ? ? ?HOME MEDICATIONS: ?Allergies as of 01/16/2022   ? ?   Reactions  ? Fish Allergy Nausea And Vomiting  ? Augmentin [amoxicillin-pot Clavulanate] Diarrhea  ? Can take regular amoxicillin with no issues ?Has patient had a PCN reaction causing immediate rash, facial/tongue/throat swelling, SOB or  lightheadedness with hypotension: No ?Has patient had a PCN reaction causing severe rash involving mucus membranes or skin necrosis: No ?Has patient had a PCN reaction that required hospitalization: No ?Has patient had a PCN reaction occurring within the last 10 years: Yes--but DIARRHEA ONLY ?If all of the above answers are "NO", then may proceed with Cephalosporin [place]  ? Citalopram Other (See Comments)  ? Tachycardia  ? Eggs Or Egg-derived Products Diarrhea, Nausea And Vomiting  ? Sulfonamide Derivatives Itching  ? ?  ? ?  ?Medication List  ?  ? ?  ? Accurate as of January 16, 2022  6:24 PM. If you have any questions, ask your nurse or doctor.  ?  ?  ? ?  ? ?STOP taking these medications   ? ?diclofenac Sodium 1 % Gel ?Commonly known as: VOLTAREN ?Stopped by: Dorita Sciara, MD ?  ? ?  ? ?TAKE these medications   ? ?calcium gluconate 500 MG tablet ?Take 1 tablet by mouth 2 (two) times daily. ?  ?clonazePAM 0.5 MG tablet ?Commonly known as: KLONOPIN ?TAKE 1 TABLET BY MOUTH EVERY DAY AS NEEDED FOR ANXIETY ?  ?estradiol 0.025 MG/24HR ?Commonly known as: VIVELLE-DOT ?Place 1 patch onto the skin 2 (two) times a week. ?  ?FLUoxetine 20 MG tablet ?Commonly known as: PROZAC ?TAKE 3 TABLETS BY MOUTH EVERY DAY ?  ?levonorgestrel 20 MCG/24HR IUD ?Commonly known as: MIRENA ?1 Intra Uterine Device (1 each total) by Intrauterine route once. ?  ?MULTI-VITAMIN/IRON PO ?Take 1 tablet by mouth daily. ?  ?omeprazole 20 MG tablet ?Commonly known as: PRILOSEC OTC ?Take 20 mg by mouth daily before breakfast. ?  ?ondansetron 4 MG disintegrating tablet ?Commonly known as: Zofran ODT ?Take 1 tablet (4 mg total) by mouth every 8 (eight) hours as needed for nausea or vomiting. ?  ?rizatriptan 10 MG tablet ?Commonly known as: MAXALT ?TAKE 1 TABLET BY MOUTH AS NEEDED FOR MIGRAINE. MAY REPEAT IN 2 HOURS IF NEEDED ?  ?tiZANidine 4 MG tablet ?Commonly known as: Zanaflex ?Take 1 tablet (4 mg total) by mouth every 6 (six) hours as needed for  muscle spasms. ?  ?topiramate 100 MG tablet ?Commonly known as: TOPAMAX ?TAKE 1 TABLET BY MOUTH EVERY DAY ?  ?traZODone 50 MG tablet ?Commonly known as: DESYREL ?TAKE 0.5-1 TABLETS (25-50 MG TOTAL) BY MOUTH AT BEDTIME AS NEEDED FOR SLEEP. ?  ?Vitamin D (Ergocalciferol) 1.25 MG (50000 UNIT) Caps capsule ?Commonly known as: DRISDOL ?TAKE 1 CAPSULE BY MOUTH ONE TIME PER WEEK ?  ? ?  ?  ? ? ?REVIEW OF SYSTEMS: ?A comprehensive ROS was conducted with the patient and is negative except as per HPI  ? ? ?OBJECTIVE:  ?VS: BP 110/80 (BP Location: Left Arm, Patient Position: Sitting, Cuff Size: Normal)   Pulse 82   Ht '5\' 2"'$  (1.575 m)   Wt 147 lb 3.2  oz (66.8 kg)   SpO2 99%   BMI 26.92 kg/m?   ? ?Wt Readings from Last 3 Encounters:  ?01/16/22 147 lb 3.2 oz (66.8 kg)  ?12/10/21 144 lb 6.4 oz (65.5 kg)  ?08/01/21 133 lb (60.3 kg)  ? ? ? ?EXAM: ?General: Pt appears well and is in NAD  ?Eyes: External eye exam normal without stare, lid lag or exophthalmos.  EOM intact.    ?Neck: General: Supple without adenopathy. ?Thyroid: Thyroid size normal.  No goiter or nodules appreciated.  ?Lungs: Clear with good BS bilat with no rales, rhonchi, or wheezes  ?Heart: Auscultation: RRR.  ?Abdomen: Normoactive bowel sounds, soft, nontender, without masses or organomegaly palpable  ?Extremities:  ?BL LE: No pretibial edema normal ROM and strength.  ?Mental Status: Judgment, insight: Intact ?Orientation: Oriented to time, place, and person ?Mood and affect: No depression, anxiety, or agitation  ? ? ? ?DATA REVIEWED: ? ?  ? Latest Reference Range & Units 01/16/22 12:24  ?Sodium 135 - 145 mEq/L 134 (L)  ?Potassium 3.5 - 5.1 mEq/L 3.9  ?Chloride 96 - 112 mEq/L 104  ?CO2 19 - 32 mEq/L 25  ?Glucose 70 - 99 mg/dL 70  ?BUN 6 - 23 mg/dL 16  ?Creatinine 0.40 - 1.20 mg/dL 0.69  ?Calcium 8.4 - 10.5 mg/dL 8.5  ?Alkaline Phosphatase 39 - 117 U/L 65  ?Albumin 3.5 - 5.2 g/dL 3.9  ?AST 0 - 40 IU/L ?0 - 37 U/L 35 ?31  ?ALT 0 - 32 IU/L ?0 - 35 U/L 46 (H) ?43  (H)  ?Total Protein 6.0 - 8.3 g/dL 5.8 (L)  ?Total Bilirubin 0.2 - 1.2 mg/dL 0.3  ?GFR >60.00 mL/min 98.85  ? ? Latest Reference Range & Units 01/16/22 12:24  ?Cortisol, Plasma ug/dL 6.4  ?FSH mIU/ML 26.5  ?Prolactin ng/

## 2022-01-16 ENCOUNTER — Ambulatory Visit: Payer: BC Managed Care – PPO | Admitting: Internal Medicine

## 2022-01-16 ENCOUNTER — Encounter: Payer: Self-pay | Admitting: Internal Medicine

## 2022-01-16 VITALS — BP 110/80 | HR 82 | Ht 62.0 in | Wt 147.2 lb

## 2022-01-16 DIAGNOSIS — Z87898 Personal history of other specified conditions: Secondary | ICD-10-CM | POA: Diagnosis not present

## 2022-01-16 DIAGNOSIS — R7989 Other specified abnormal findings of blood chemistry: Secondary | ICD-10-CM

## 2022-01-16 DIAGNOSIS — E162 Hypoglycemia, unspecified: Secondary | ICD-10-CM | POA: Diagnosis not present

## 2022-01-16 LAB — CORTISOL: Cortisol, Plasma: 6.4 ug/dL

## 2022-01-16 LAB — COMPREHENSIVE METABOLIC PANEL
ALT: 43 U/L — ABNORMAL HIGH (ref 0–35)
AST: 31 U/L (ref 0–37)
Albumin: 3.9 g/dL (ref 3.5–5.2)
Alkaline Phosphatase: 65 U/L (ref 39–117)
BUN: 16 mg/dL (ref 6–23)
CO2: 25 mEq/L (ref 19–32)
Calcium: 8.5 mg/dL (ref 8.4–10.5)
Chloride: 104 mEq/L (ref 96–112)
Creatinine, Ser: 0.69 mg/dL (ref 0.40–1.20)
GFR: 98.85 mL/min (ref >60.00)
Glucose, Bld: 70 mg/dL (ref 70–99)
Potassium: 3.9 mEq/L (ref 3.5–5.1)
Sodium: 134 mEq/L — ABNORMAL LOW (ref 135–145)
Total Bilirubin: 0.3 mg/dL (ref 0.2–1.2)
Total Protein: 5.8 g/dL — ABNORMAL LOW (ref 6.0–8.3)

## 2022-01-16 LAB — T4, FREE: Free T4: 0.68 ng/dL (ref 0.60–1.60)

## 2022-01-16 LAB — TSH: TSH: 0.9 u[IU]/mL (ref 0.35–5.50)

## 2022-01-16 LAB — HEMOGLOBIN A1C: Hgb A1c MFr Bld: 5.6 % (ref 4.6–6.5)

## 2022-01-16 LAB — FOLLICLE STIMULATING HORMONE: FSH: 26.5 m[IU]/mL

## 2022-01-16 NOTE — Patient Instructions (Signed)
-   Please get the [employer] sugar meter from [employer] and check sugar fasting every morning and when you feel funny " dizzy, clammy, hot flash, jittery or shaky or hungry" ? ? ?- Let me know if your sugars are in the 50's or less  ? ?

## 2022-01-17 LAB — FIB-4 W/REFLEX NASH FIBROSURE
ALT: 46 IU/L — ABNORMAL HIGH (ref 0–32)
AST: 35 IU/L (ref 0–40)
FIB-4 Index: 0.86 (ref 0.00–2.67)
Platelets: 318 10*3/uL (ref 150–450)

## 2022-01-20 ENCOUNTER — Ambulatory Visit
Admission: RE | Admit: 2022-01-20 | Discharge: 2022-01-20 | Disposition: A | Discharge status: Home / Self Care | Payer: BC Managed Care – PPO | Source: Ambulatory Visit | Attending: Internal Medicine | Admitting: Internal Medicine

## 2022-01-20 ENCOUNTER — Encounter: Payer: Self-pay | Admitting: Internal Medicine

## 2022-01-20 DIAGNOSIS — R7989 Other specified abnormal findings of blood chemistry: Secondary | ICD-10-CM

## 2022-01-20 DIAGNOSIS — E162 Hypoglycemia, unspecified: Secondary | ICD-10-CM

## 2022-01-22 ENCOUNTER — Encounter: Payer: Self-pay | Admitting: Internal Medicine

## 2022-01-22 DIAGNOSIS — F419 Anxiety disorder, unspecified: Secondary | ICD-10-CM

## 2022-01-22 LAB — EXTRA SPECIMEN

## 2022-01-22 LAB — INSULIN-LIKE GROWTH FACTOR
IGF-I, LC/MS: 135 ng/mL (ref 50–317)
Z-Score (Female): -0.1 SD (ref ?–2.0)

## 2022-01-22 LAB — PROLACTIN: Prolactin: 5.7 ng/mL

## 2022-01-22 LAB — ACTH: C206 ACTH: 15 pg/mL (ref 6–50)

## 2022-01-29 ENCOUNTER — Telehealth: Payer: BC Managed Care – PPO | Admitting: Internal Medicine

## 2022-02-15 ENCOUNTER — Other Ambulatory Visit: Payer: Self-pay | Admitting: Internal Medicine

## 2022-02-17 ENCOUNTER — Ambulatory Visit: Payer: BC Managed Care – PPO | Admitting: Internal Medicine

## 2022-02-17 ENCOUNTER — Encounter: Payer: Self-pay | Admitting: Internal Medicine

## 2022-02-17 DIAGNOSIS — R635 Abnormal weight gain: Secondary | ICD-10-CM | POA: Diagnosis not present

## 2022-02-17 DIAGNOSIS — E161 Other hypoglycemia: Secondary | ICD-10-CM | POA: Insufficient documentation

## 2022-02-17 DIAGNOSIS — J0110 Acute frontal sinusitis, unspecified: Secondary | ICD-10-CM | POA: Insufficient documentation

## 2022-02-17 MED ORDER — LEVOFLOXACIN 500 MG PO TABS: 500.0000 mg | ORAL_TABLET | Freq: Every day | ORAL | 0 refills | Status: AC

## 2022-02-17 MED ORDER — FLUCONAZOLE 150 MG PO TABS: 150.0000 mg | ORAL_TABLET | Freq: Every day | ORAL | 0 refills | Status: DC

## 2022-02-17 NOTE — Progress Notes (Signed)
? ?Subjective:  ?Patient ID: Selena [name], female    DOB: March 15, 1966  Age: 56 y.o. MRN: [id number] ? ?CC: Diagnoses of Reactive hypoglycemia, Unintended weight gain, and Acute non-recurrent frontal sinusitis were pertinent to this visit. ? ? ?HPI ?Selena [name] presents for  ?Chief Complaint  ?Patient presents with  ? Follow-up  ?  Follow up to discuss visit with endocrinologist that states that she is hypoglycemic. Pt is wanting to know how to handle until she gets to see the dietician in June.   ? Acute Visit  ?  Pt was stated that for about 10 days she has been having cold like symptoms. This morning she woke up with her eyes matted together and draining, sore throat, congestion that is green and bloody, swollen gland on right side and right ear fullness.   ? ?1) reactive hypoglycemia  awaiting nutritional evaluation . After her bariatric surgery she readched a nadir of 121 lbs in march 2022 and has regained 25 lbs since then due to cravings.  ? ?2) URI with cervical LAD, sore throat , and pharyngitis  .  Sputum is blood streaked and purulent ,  eyelids were matted . Lots of exposure to strep.   10 days of symptoms . Frontal headaches,  head feels heavy when lying down .  No fevers.  Infrequent cough  ? ? ?Outpatient Medications Prior to Visit  ?Medication Sig Dispense Refill  ? calcium gluconate 500 MG tablet Take 1 tablet by mouth 2 (two) times daily.     ? clonazePAM (KLONOPIN) 0.5 MG tablet TAKE 1 TABLET BY MOUTH EVERY DAY AS NEEDED FOR ANXIETY 30 tablet 2  ? estradiol (VIVELLE-DOT) 0.025 MG/24HR Place 1 patch onto the skin 2 (two) times a week.    ? FLUoxetine (PROZAC) 20 MG tablet TAKE 3 TABLETS BY MOUTH EVERY DAY 270 tablet 2  ? levonorgestrel (MIRENA) 20 MCG/24HR IUD 1 Intra Uterine Device (1 each total) by Intrauterine route once. 1 each 0  ? Multiple Vitamins-Iron (MULTI-VITAMIN/IRON PO) Take 1 tablet by mouth daily.     ? omeprazole (PRILOSEC OTC) 20 MG tablet Take 20 mg by mouth daily before  breakfast.     ? ondansetron (ZOFRAN ODT) 4 MG disintegrating tablet Take 1 tablet (4 mg total) by mouth every 8 (eight) hours as needed for nausea or vomiting. 20 tablet 0  ? rizatriptan (MAXALT) 10 MG tablet TAKE 1 TABLET BY MOUTH AS NEEDED FOR MIGRAINE. MAY REPEAT IN 2 HOURS IF NEEDED 10 tablet 2  ? tiZANidine (ZANAFLEX) 4 MG tablet Take 1 tablet (4 mg total) by mouth every 6 (six) hours as needed for muscle spasms. 30 tablet 0  ? topiramate (TOPAMAX) 100 MG tablet TAKE 1 TABLET BY MOUTH EVERY DAY 90 tablet 1  ? traZODone (DESYREL) 50 MG tablet TAKE 0.5-1 TABLETS (25-50 MG TOTAL) BY MOUTH AT BEDTIME AS NEEDED FOR SLEEP. 90 tablet 2  ? Vitamin D, Ergocalciferol, (DRISDOL) 1.25 MG (50000 UNIT) CAPS capsule TAKE 1 CAPSULE BY MOUTH ONE TIME PER WEEK 12 capsule 3  ? ?No facility-administered medications prior to visit.  ? ? ?Review of Systems; ? ?Patient denies headache, fevers, malaise, unintentional weight loss, skin rash, eye pain, sinus congestion and sinus pain, sore throat, dysphagia,  hemoptysis , cough, dyspnea, wheezing, chest pain, palpitations, orthopnea, edema, abdominal pain, nausea, melena, diarrhea, constipation, flank pain, dysuria, hematuria, urinary  Frequency, nocturia, numbness, tingling, seizures,  Focal weakness, Loss of consciousness,  Tremor, insomnia, depression, anxiety, and suicidal ideation.   ? ? ? ?  Objective:  ?BP 120/80   Pulse 78   Temp 98.1 ?F (36.7 ?C) (Oral)   Ht '5\' 2"'$  (1.575 m)   Wt 146 lb 6.4 oz (66.4 kg)   SpO2 98%   BMI 26.78 kg/m?  ? ?BP Readings from Last 3 Encounters:  ?02/17/22 120/80  ?01/16/22 110/80  ?12/10/21 (!) 94/58  ? ? ?Wt Readings from Last 3 Encounters:  ?02/17/22 146 lb 6.4 oz (66.4 kg)  ?01/16/22 147 lb 3.2 oz (66.8 kg)  ?12/10/21 144 lb 6.4 oz (65.5 kg)  ? ? ?General appearance: alert, cooperative and appears stated age ?Ears: normal TM's and external ear canals both ears ?Throat: lips, mucosa, and tongue normal; teeth and gums normal ?Neck: no adenopathy,  no carotid bruit, supple, symmetrical, trachea midline and thyroid not enlarged, symmetric, no tenderness/mass/nodules ?Back: symmetric, no curvature. ROM normal. No CVA tenderness. ?Lungs: clear to auscultation bilaterally ?Heart: regular rate and rhythm, S1, S2 normal, no murmur, click, rub or gallop ?Abdomen: soft, non-tender; bowel sounds normal; no masses,  no organomegaly ?Pulses: 2+ and symmetric ?Skin: Skin color, texture, turgor normal. No rashes or lesions ?Lymph nodes: Cervical, supraclavicular, and axillary nodes normal. ? ?Lab Results  ?Component Value Date  ? HGBA1C 5.6 01/16/2022  ? HGBA1C 5.5 12/10/2021  ? HGBA1C 5.5 06/11/2021  ? ? ?Lab Results  ?Component Value Date  ? CREATININE 0.69 01/16/2022  ? CREATININE 0.69 01/13/2022  ? CREATININE 0.83 12/10/2021  ? ? ?Lab Results  ?Component Value Date  ? WBC 5.6 12/10/2021  ? HGB 13.4 12/10/2021  ? HCT 39.8 12/10/2021  ? PLT 318 01/16/2022  ? GLUCOSE 70 01/16/2022  ? CHOL 158 06/11/2021  ? TRIG 68.0 06/11/2021  ? HDL 58.30 06/11/2021  ? LDLDIRECT 171 (H) 05/21/2016  ? Palmer 86 06/11/2021  ? ALT 46 (H) 01/16/2022  ? ALT 43 (H) 01/16/2022  ? AST 35 01/16/2022  ? AST 31 01/16/2022  ? NA 134 (L) 01/16/2022  ? K 3.9 01/16/2022  ? CL 104 01/16/2022  ? CREATININE 0.69 01/16/2022  ? BUN 16 01/16/2022  ? CO2 25 01/16/2022  ? TSH 0.90 01/16/2022  ? INR 1.0 07/13/2008  ? HGBA1C 5.6 01/16/2022  ? MICROALBUR <0.7 12/10/2021  ? ? ?US Abdomen Complete ? ?Result Date: 01/20/2022 ?CLINICAL DATA:  Elevated LFTs.  Prior cholecystectomy. EXAM: ABDOMEN ULTRASOUND COMPLETE COMPARISON:  CT abdomen pelvis 07/25/2019 FINDINGS: Gallbladder: Surgically absent Common bile duct: Diameter: 7 mm Liver: Mildly increased in echogenicity. Portal vein is patent on color Doppler imaging with normal direction of blood flow towards the liver. IVC: No abnormality visualized. Pancreas: Not visualized. Spleen: Size and appearance within normal limits. Right Kidney: Length: 8.9 cm. Echogenicity  within normal limits. No mass or hydronephrosis visualized. Left Kidney: Length: 9.4 cm. Echogenicity within normal limits. No mass or hydronephrosis visualized. Abdominal aorta: Proximal and mid aorta not well visualized or evaluated. Other findings: Known hepatic dome lesion not visualized due to overlying bowel gas. IMPRESSION: Increased hepatic parenchymal echogenicity suggestive of steatosis. Surgically absent gallbladder. No acute process. Electronically Signed   By: Lovey Newcomer M.D.   On: 01/20/2022 15:56  ? ? ?Assessment & Plan:  ? ?Problem List Items Addressed This Visit   ? ? Reactive hypoglycemia  ?  SECONDARY TO BARIATRIC SURGERY.  Diet reviewed.  Awaiting nutritional consult in June  ? ?  ?  ? Sinusitis, acute frontal  ?  Secondary to prolonged viral infection.  Augmentin prescribed  ? ?  ?  ? Relevant  Medications  ? levofloxacin (LEVAQUIN) 500 MG tablet  ? fluconazole (DIFLUCAN) 150 MG tablet  ? Unintended weight gain  ?  Explained that a high protein diet is also high calorie.  Advised to resume exercising daily  ? ?  ?  ? ? ?I spent a total of  57mnutes with this patient in a face to face visit on the date of this encounter reviewing the last office visit with Endocrinology,  most recent labs ,   patient'ss diet and eating habits, home blood pressure readings, and post visit ordering of testing and therapeutics.   ? ?Follow-up: No follow-ups on file. ? ? ?TCrecencio Mc MD ?

## 2022-02-17 NOTE — Assessment & Plan Note (Signed)
SECONDARY TO BARIATRIC SURGERY.  Diet reviewed.  Awaiting nutritional consult in June  ?

## 2022-02-17 NOTE — Assessment & Plan Note (Signed)
Secondary to prolonged viral infection.  Augmentin prescribed  ?

## 2022-02-17 NOTE — Assessment & Plan Note (Signed)
Explained that a high protein diet is also high calorie.  Advised to resume exercising daily  ?

## 2022-02-17 NOTE — Patient Instructions (Addendum)
You have an adenovirus that is causing your upper respiratory infection  ? ?Continue your antihistamine  up to 2 times daily for the concurrent pollen and control of rhinitis   ? ?Adding levaquin for the sinus infection. Take once daily with food for 7 days  ? ?Continue daily yogurt for your probiotic.  Fluconazole sent if you develop yeast infection   ? ?Afrin for nasal congestion as needed (nasal spray) ? ? ?To prevent hypoglycemia : ? ?Avoid cake, cookies,  ice cream .  IF YOU MUST EAT IT  COMBINE WITH AN OUNCE OR 2  OF CHEESE.  Eat high protein meals and snacks every 3 hours.   ? ? ? ?

## 2022-03-02 ENCOUNTER — Encounter: Payer: Self-pay | Admitting: Internal Medicine

## 2022-03-03 ENCOUNTER — Ambulatory Visit: Payer: BC Managed Care – PPO | Admitting: Internal Medicine

## 2022-03-03 ENCOUNTER — Encounter: Payer: Self-pay | Admitting: Internal Medicine

## 2022-03-03 VITALS — BP 98/62 | HR 71 | Temp 97.7°F | Ht 62.0 in | Wt 146.4 lb

## 2022-03-03 DIAGNOSIS — K760 Fatty (change of) liver, not elsewhere classified: Secondary | ICD-10-CM | POA: Diagnosis not present

## 2022-03-03 DIAGNOSIS — K146 Glossodynia: Secondary | ICD-10-CM

## 2022-03-03 MED ORDER — GABAPENTIN 100 MG PO CAPS: 100.0000 mg | ORAL_CAPSULE | Freq: Three times a day (TID) | ORAL | 3 refills | Status: DC

## 2022-03-03 NOTE — Progress Notes (Signed)
Subjective:  Patient ID: Selena [name], female    DOB: 03-Dec-1965  Age: 56 y.o. 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 A diagnosis of Hepatic steatosis was also pertinent to this visit.   HPI Selena [name] presents for  Chief Complaint  Patient presents with   Acute Visit    Tongue burning   Last week  symptoms onset with feeling that the tip of tongue felt burned.  Symptoms spread to the roof of mouth , then upper  lips started burning . Certain foods started bothering her too.  Tried a mouthwash  for sore mouth that contained h202 .  Better in the morning ,  but Worse as the day progresses  Taking a MVI and zinc due to low zinc    Outpatient Medications Prior to Visit  Medication Sig Dispense Refill   calcium gluconate 500 MG tablet Take 1 tablet by mouth 2 (two) times daily.      clonazePAM (KLONOPIN) 0.5 MG tablet TAKE 1 TABLET BY MOUTH EVERY DAY AS NEEDED FOR ANXIETY 30 tablet 2   estradiol (VIVELLE-DOT) 0.025 MG/24HR Place 1 patch onto the skin 2 (two) times a week.     fluconazole (DIFLUCAN) 150 MG tablet Take 1 tablet (150 mg total) by mouth daily. 2 tablet 0   FLUoxetine (PROZAC) 20 MG tablet TAKE 3 TABLETS BY MOUTH EVERY DAY 270 tablet 2   levonorgestrel (MIRENA) 20 MCG/24HR IUD 1 Intra Uterine Device (1 each total) by Intrauterine route once. 1 each 0   Multiple Vitamins-Iron (MULTI-VITAMIN/IRON PO) Take 1 tablet by mouth daily.      omeprazole (PRILOSEC OTC) 20 MG tablet Take 20 mg by mouth daily before breakfast.      ondansetron (ZOFRAN ODT) 4 MG disintegrating tablet Take 1 tablet (4 mg total) by mouth every 8 (eight) hours as needed for nausea or vomiting. 20 tablet 0   rizatriptan (MAXALT) 10 MG tablet TAKE 1 TABLET BY MOUTH AS NEEDED FOR MIGRAINE. MAY REPEAT IN 2 HOURS IF NEEDED 10 tablet 2   tiZANidine (ZANAFLEX) 4 MG tablet Take 1 tablet (4 mg total) by mouth every 6 (six) hours as needed for muscle spasms. 30  tablet 0   topiramate (TOPAMAX) 100 MG tablet TAKE 1 TABLET BY MOUTH EVERY DAY 90 tablet 1   traZODone (DESYREL) 50 MG tablet TAKE 0.5-1 TABLETS (25-50 MG TOTAL) BY MOUTH AT BEDTIME AS NEEDED FOR SLEEP. 90 tablet 2   Vitamin D, Ergocalciferol, (DRISDOL) 1.25 MG (50000 UNIT) CAPS capsule TAKE 1 CAPSULE BY MOUTH ONE TIME PER WEEK 12 capsule 3   No facility-administered medications prior to visit.    Review of Systems;  Patient denies headache, fevers, malaise, unintentional weight loss, skin rash, eye pain, sinus congestion and sinus pain, sore throat, dysphagia,  hemoptysis , cough, dyspnea, wheezing, chest pain, palpitations, orthopnea, edema, abdominal pain, nausea, melena, diarrhea, constipation, flank pain, dysuria, hematuria, urinary  Frequency, nocturia, numbness, tingling, seizures,  Focal weakness, Loss of consciousness,  Tremor, insomnia, depression, anxiety, and suicidal ideation.      Objective:  BP 98/62 (BP Location: Left Arm, Patient Position: Sitting, Cuff Size: Normal)   Pulse 71   Temp 97.7 F (36.5 C) (Oral)   Ht '5\' 2"'$  (1.575 m)   Wt 146 lb 6.4 oz (66.4 kg)   SpO2 99%   BMI 26.78 kg/m   BP Readings from Last 3 Encounters:  03/03/22 98/62  02/17/22 120/80  01/16/22 110/80  Wt Readings from Last 3 Encounters:  03/03/22 146 lb 6.4 oz (66.4 kg)  02/17/22 146 lb 6.4 oz (66.4 kg)  01/16/22 147 lb 3.2 oz (66.8 kg)    General appearance: alert, cooperative and appears stated age Ears: normal TM's and external ear canals both ears Throat: lips, mucosa, and tongue normal; teeth and gums normal Neck: no adenopathy, no carotid bruit, supple, symmetrical, trachea midline and thyroid not enlarged, symmetric, no tenderness/mass/nodules Back: symmetric, no curvature. ROM normal. No CVA tenderness. Lungs: clear to auscultation bilaterally Heart: regular rate and rhythm, S1, S2 normal, no murmur, click, rub or gallop Abdomen: soft, non-tender; bowel sounds normal; no  masses,  no organomegaly Pulses: 2+ and symmetric Skin: Skin color, texture, turgor normal. No rashes or lesions Lymph nodes: Cervical, supraclavicular, and axillary nodes normal.  Lab Results  Component Value Date   HGBA1C 5.6 01/16/2022   HGBA1C 5.5 12/10/2021   HGBA1C 5.5 06/11/2021    Lab Results  Component Value Date   CREATININE 0.69 01/16/2022   CREATININE 0.69 01/13/2022   CREATININE 0.83 12/10/2021    Lab Results  Component Value Date   WBC 5.6 12/10/2021   HGB 13.4 12/10/2021   HCT 39.8 12/10/2021   PLT 318 01/16/2022   GLUCOSE 70 01/16/2022   CHOL 158 06/11/2021   TRIG 68.0 06/11/2021   HDL 58.30 06/11/2021   LDLDIRECT 171 (H) 05/21/2016   LDLCALC 86 06/11/2021   ALT 46 (H) 01/16/2022   ALT 43 (H) 01/16/2022   AST 35 01/16/2022   AST 31 01/16/2022   NA 134 (L) 01/16/2022   K 3.9 01/16/2022   CL 104 01/16/2022   CREATININE 0.69 01/16/2022   BUN 16 01/16/2022   CO2 25 01/16/2022   TSH 0.90 01/16/2022   INR 1.0 07/13/2008   HGBA1C 5.6 01/16/2022   MICROALBUR <0.7 12/10/2021    US Abdomen Complete  Result Date: 01/20/2022 CLINICAL DATA:  Elevated LFTs.  Prior cholecystectomy. EXAM: ABDOMEN ULTRASOUND COMPLETE COMPARISON:  CT abdomen pelvis 07/25/2019 FINDINGS: Gallbladder: Surgically absent Common bile duct: Diameter: 7 mm Liver: Mildly increased in echogenicity. Portal vein is patent on color Doppler imaging with normal direction of blood flow towards the liver. IVC: No abnormality visualized. Pancreas: Not visualized. Spleen: Size and appearance within normal limits. Right Kidney: Length: 8.9 cm. Echogenicity within normal limits. No mass or hydronephrosis visualized. Left Kidney: Length: 9.4 cm. Echogenicity within normal limits. No mass or hydronephrosis visualized. Abdominal aorta: Proximal and mid aorta not well visualized or evaluated. Other findings: Known hepatic dome lesion not visualized due to overlying bowel gas. IMPRESSION: Increased hepatic  parenchymal echogenicity suggestive of steatosis. Surgically absent gallbladder. No acute process. Electronically Signed   By: Lovey Newcomer M.D.   On: 01/20/2022 15:56    Assessment & Plan:   Problem List Items Addressed This Visit     Burning mouth syndrome - Primary    Symptoms have only been present for one week.  Oral exam is normal.  History of zinc deficiency noted last September.  History of bariatric surgery.  Had let her zinc supplementation lapse , symptoms improve with use of zinc/elderberyy lozenges.  Prescribing gabapentin for management of significant discomfort ; she has tolerated it in the past for other pain issues.  Return for deficiency screenings        Relevant Orders   B12 and Folate Panel   Vitamin B6   IBC + Ferritin   Zinc   Hepatic steatosis    Noted  on ultrasound done last month for evaluation of elevated LFTs  Lab Results  Component Value Date   ALT 46 (H) 01/16/2022   ALT 43 (H) 01/16/2022   AST 35 01/16/2022   AST 31 01/16/2022   ALKPHOS 65 01/16/2022   BILITOT 0.3 01/16/2022          Follow-up: Return in about 4 weeks (around 03/31/2022).   [name], MD

## 2022-03-03 NOTE — Assessment & Plan Note (Signed)
Noted on ultrasound done last month for evaluation of elevated LFTs  Lab Results  Component Value Date   ALT 46 (H) 01/16/2022   ALT 43 (H) 01/16/2022   AST 35 01/16/2022   AST 31 01/16/2022   ALKPHOS 65 01/16/2022   BILITOT 0.3 01/16/2022

## 2022-03-03 NOTE — Assessment & Plan Note (Signed)
Symptoms have only been present for one week.  Oral exam is normal.  History of zinc deficiency noted last September.  History of bariatric surgery.  Had let her zinc supplementation lapse , symptoms improve with use of zinc/elderberyy lozenges.  Prescribing gabapentin for management of significant discomfort ; she has tolerated it in the past for other pain issues.  Return for deficiency screenings

## 2022-03-03 NOTE — Telephone Encounter (Signed)
Spoke with pt and scheduled her for an appt this afternoon.

## 2022-03-05 ENCOUNTER — Other Ambulatory Visit (INDEPENDENT_AMBULATORY_CARE_PROVIDER_SITE_OTHER): Payer: BC Managed Care – PPO

## 2022-03-05 ENCOUNTER — Other Ambulatory Visit: Payer: BC Managed Care – PPO

## 2022-03-05 DIAGNOSIS — K146 Glossodynia: Secondary | ICD-10-CM

## 2022-03-06 LAB — IBC + FERRITIN
Ferritin: 15 ng/mL (ref 10.0–291.0)
Iron: 147 ug/dL — ABNORMAL HIGH (ref 42–145)
Saturation Ratios: 34 % (ref 20.0–50.0)
TIBC: 432.6 ug/dL (ref 250.0–450.0)
Transferrin: 309 mg/dL (ref 212.0–360.0)

## 2022-03-06 LAB — B12 AND FOLATE PANEL
Folate: 20.3 ng/mL (ref >5.9)
Vitamin B-12: 611 pg/mL (ref 211–911)

## 2022-03-11 LAB — VITAMIN B6: Vitamin B6: 25.4 ng/mL — ABNORMAL HIGH (ref 2.1–21.7)

## 2022-03-11 LAB — ZINC: Zinc: 72 ug/dL (ref 60–130)

## 2022-03-14 ENCOUNTER — Other Ambulatory Visit: Payer: Self-pay | Admitting: Internal Medicine

## 2022-03-23 ENCOUNTER — Encounter: Payer: Self-pay | Admitting: Skilled Nursing Facility1

## 2022-03-23 ENCOUNTER — Encounter: Payer: BC Managed Care – PPO | Attending: Internal Medicine | Admitting: Skilled Nursing Facility1

## 2022-03-23 DIAGNOSIS — E161 Other hypoglycemia: Secondary | ICD-10-CM | POA: Insufficient documentation
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
## 2022-03-26 ENCOUNTER — Telehealth: Payer: Self-pay | Admitting: Skilled Nursing Facility1

## 2022-04-08 ENCOUNTER — Ambulatory Visit: Payer: BC Managed Care – PPO | Admitting: Podiatry

## 2022-04-08 DIAGNOSIS — B351 Tinea unguium: Secondary | ICD-10-CM | POA: Diagnosis not present

## 2022-04-08 MED ORDER — EFINACONAZOLE 10 % EX SOLN: 1.0000 [drp] | Freq: Every day | CUTANEOUS | 11 refills | Status: DC

## 2022-04-09 NOTE — Progress Notes (Signed)
  Subjective:  Patient ID: Selena [name], female    DOB: 11/06/1965,  MRN: [id number]  Chief Complaint  Patient presents with   Nail Problem       np right great toenail possible fungus/ discolored and thick    56 y.o. female presents with the above complaint. History confirmed with patient.  This been present and bothersome for quite some time, she previously took Lamisil many years ago and it completely resolved with administration of this.  Since that time she has been diagnosed with NASH, she typically has mild elevations in her liver function testing but none has been severe enough to require further treatment or medications or procedures.  Objective:  Physical Exam: warm, good capillary refill, no trophic changes or ulcerative lesions, normal DP and PT pulses, normal sensory exam, and onychomycosis right hallux nail.    Assessment:   1. Onychomycosis      Plan:  Patient was evaluated and treated and all questions answered.  We discussed etiology and treatment options of onychomycosis in detail.  We discussed oral topical and laser treatment.  With her history of NASH use of terbinafine will need to be closely regulated and monitored.  I recommend we start with topical treatment with Jublia and a prescription for this was sent to [facility] in [place].  Also discussed possible laser treatment which would be a good option for her as well.  She will use the Jublia for the next 4 months and then let me know how she is doing if she would like to add any additional treatment beyond topical  Return if symptoms worsen or fail to improve.

## 2022-04-22 ENCOUNTER — Ambulatory Visit (INDEPENDENT_AMBULATORY_CARE_PROVIDER_SITE_OTHER): Payer: BC Managed Care – PPO | Admitting: Psychiatry

## 2022-04-22 ENCOUNTER — Encounter: Payer: Self-pay | Admitting: Psychiatry

## 2022-04-22 VITALS — BP 104/68 | HR 87 | Temp 98.7°F | Wt 146.6 lb

## 2022-04-22 DIAGNOSIS — F4310 Post-traumatic stress disorder, unspecified: Secondary | ICD-10-CM | POA: Insufficient documentation

## 2022-04-22 DIAGNOSIS — F439 Reaction to severe stress, unspecified: Secondary | ICD-10-CM

## 2022-04-22 DIAGNOSIS — G4700 Insomnia, unspecified: Secondary | ICD-10-CM | POA: Diagnosis not present

## 2022-04-22 MED ORDER — GABAPENTIN 300 MG PO CAPS: 300.0000 mg | ORAL_CAPSULE | Freq: Every day | ORAL | 1 refills | Status: DC

## 2022-04-22 MED ORDER — HYDROXYZINE HCL 25 MG PO TABS: 25.0000 mg | ORAL_TABLET | Freq: Every evening | ORAL | 1 refills | Status: DC | PRN
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
## 2022-04-22 NOTE — Patient Instructions (Addendum)
Gabapentin Capsules or Tablets What is this medication? GABAPENTIN (GA ba pen tin) treats nerve pain. It may also be used to prevent and control seizures in people with epilepsy. It works by calming overactive nerves in your body. This medicine may be used for other purposes; ask your health care provider or pharmacist if you have questions. COMMON BRAND NAME(S): Active-PAC with Gabapentin, Gabarone, Gralise, Neurontin What should I tell my care team before I take this medication? They need to know if you have any of these conditions: Alcohol or substance use disorder Kidney disease Lung or breathing disease Suicidal thoughts, plans, or attempt; a previous suicide attempt by you or a family member An unusual or allergic reaction to gabapentin, other medications, foods, dyes, or preservatives Pregnant or trying to get pregnant Breast-feeding How should I use this medication? Take this medication by mouth with a glass of water. Follow the directions on the prescription label. You can take it with or without food. If it upsets your stomach, take it with food. Take your medication at regular intervals. Do not take it more often than directed. Do not stop taking except on your care team's advice. If you are directed to break the 600 or 800 mg tablets in half as part of your dose, the extra half tablet should be used for the next dose. If you have not used the extra half tablet within 28 days, it should be thrown away. A special MedGuide will be given to you by the pharmacist with each prescription and refill. Be sure to read this information carefully each time. Talk to your care team about the use of this medication in children. While this medication may be prescribed for children as young as 3 years for selected conditions, precautions do apply. Overdosage: If you think you have taken too much of this medicine contact a poison control center or emergency room at once. NOTE: This medicine is only for  you. Do not share this medicine with others. What if I miss a dose? If you miss a dose, take it as soon as you can. If it is almost time for your next dose, take only that dose. Do not take double or extra doses. What may interact with this medication? Alcohol Antihistamines for allergy, cough, and cold Certain medications for anxiety or sleep Certain medications for depression like amitriptyline, fluoxetine, sertraline Certain medications for seizures like phenobarbital, primidone Certain medications for stomach problems General anesthetics like halothane, isoflurane, methoxyflurane, propofol Local anesthetics like lidocaine, pramoxine, tetracaine Medications that relax muscles for surgery Opioid medications for pain Phenothiazines like chlorpromazine, mesoridazine, prochlorperazine, thioridazine This list may not describe all possible interactions. Give your health care provider a list of all the medicines, herbs, non-prescription drugs, or dietary supplements you use. Also tell them if you smoke, drink alcohol, or use illegal drugs. Some items may interact with your medicine. What should I watch for while using this medication? Visit your care team for regular checks on your progress. You may want to keep a record at home of how you feel your condition is responding to treatment. You may want to share this information with your care team at each visit. You should contact your care team if your seizures get worse or if you have any new types of seizures. Do not stop taking this medication or any of your seizure medications unless instructed by your care team. Stopping your medication suddenly can increase your seizures or their severity. This medication may cause serious skin   reactions. They can happen weeks to months after starting the medication. Contact your care team right away if you notice fevers or flu-like symptoms with a rash. The rash may be red or purple and then turn into blisters or  peeling of the skin. Or, you might notice a red rash with swelling of the face, lips or lymph nodes in your neck or under your arms. Wear a medical identification bracelet or chain if you are taking this medication for seizures. Carry a card that lists all your medications. This medication may affect your coordination, reaction time, or judgment. Do not drive or operate machinery until you know how this medication affects you. Sit up or stand slowly to reduce the risk of dizzy or fainting spells. Drinking alcohol with this medication can increase the risk of these side effects. Your mouth may get dry. Chewing sugarless gum or sucking hard candy, and drinking plenty of water may help. Watch for new or worsening thoughts of suicide or depression. This includes sudden changes in mood, behaviors, or thoughts. These changes can happen at any time but are more common in the beginning of treatment or after a change in dose. Call your care team right away if you experience these thoughts or worsening depression. If you become pregnant while using this medication, you may enroll in the [place] Pregnancy Registry by calling [phone]. This registry collects information about the safety of antiepileptic medication use during pregnancy. What side effects may I notice from receiving this medication? Side effects that you should report to your care team as soon as possible: Allergic reactions or angioedema--skin rash, itching, hives, swelling of the face, eyes, lips, tongue, arms, or legs, trouble swallowing or breathing Rash, fever, and swollen lymph nodes Thoughts of suicide or self harm, worsening mood, feelings of depression Trouble breathing Unusual changes in mood or behavior in children after use such as difficulty concentrating, hostility, or restlessness Side effects that usually do not require medical attention (report to your care team if they continue or are  bothersome): Dizziness Drowsiness Nausea Swelling of ankles, feet, or hands Vomiting This list may not describe all possible side effects. Call your doctor for medical advice about side effects. You may report side effects to FDA at 1-800-FDA-1088. Where should I keep my medication? Keep out of reach of children and pets. Store at room temperature between 15 and 30 degrees C (59 and 86 degrees F). Get rid of any unused medication after the expiration date. This medication may cause accidental overdose and death if taken by other adults, children, or pets. To get rid of medications that are no longer needed or have expired: Take the medication to a medication take-back program. Check with your pharmacy or law enforcement to find a location. If you cannot return the medication, check the label or package insert to see if the medication should be thrown out in the garbage or flushed down the toilet. If you are not sure, ask your care team. If it is safe to put it in the trash, empty the medication out of the container. Mix the medication with cat litter, dirt, coffee grounds, or other unwanted substance. Seal the mixture in a bag or container. Put it in the trash. NOTE: This sheet is a summary. It may not cover all possible information. If you have questions about this medicine, talk to your doctor, pharmacist, or health care provider.  2023 Elsevier/Gold Standard (2021-03-24 00:00:00) Hydroxyzine Capsules or Tablets What is this  medication? HYDROXYZINE (hye Emmett i zeen) treats the symptoms of allergies and allergic reactions. It may also be used to treat anxiety or cause drowsiness before a procedure. It works by blocking histamine, a substance released by the body during an allergic reaction. It belongs to a group of medications called antihistamines. This medicine may be used for other purposes; ask your health care provider or pharmacist if you have questions. COMMON BRAND NAME(S): ANX, Atarax,  Rezine, Vistaril What should I tell my care team before I take this medication? They need to know if you have any of these conditions: Glaucoma Heart disease History of irregular heartbeat Kidney disease Liver disease Lung or breathing disease, like asthma Stomach or intestine problems Thyroid disease Trouble passing urine An unusual or allergic reaction to hydroxyzine, cetirizine, other medications, foods, dyes or preservatives Pregnant or trying to get pregnant Breast-feeding How should I use this medication? Take this medication by mouth with a full glass of water. Follow the directions on the prescription label. You may take this medication with food or on an empty stomach. Take your medication at regular intervals. Do not take your medication more often than directed. Talk to your care team regarding the use of this medication in children. Special care may be needed. While this medication may be prescribed for children as young as 70 years of age for selected conditions, precautions do apply. Patients over 18 years old may have a stronger reaction and need a smaller dose. Overdosage: If you think you have taken too much of this medicine contact a poison control center or emergency room at once. NOTE: This medicine is only for you. Do not share this medicine with others. What if I miss a dose? If you miss a dose, take it as soon as you can. If it is almost time for your next dose, take only that dose. Do not take double or extra doses. What may interact with this medication? Do not take this medication with any of the following: Cisapride Dronedarone Pimozide Thioridazine This medication may also interact with the following: Alcohol Antihistamines for allergy, cough, and cold Atropine Barbiturate medications for sleep or seizures, like phenobarbital Certain antibiotics like erythromycin or clarithromycin Certain medications for anxiety or sleep Certain medications for bladder  problems like oxybutynin, tolterodine Certain medications for depression or psychotic disturbances Certain medications for irregular heart beat Certain medications for Parkinson's disease like benztropine, trihexyphenidyl Certain medications for seizures like phenobarbital, primidone Certain medications for stomach problems like dicyclomine, hyoscyamine Certain medications for travel sickness like scopolamine Ipratropium Narcotic medications for pain Other medications that prolong the QT interval (which can cause an abnormal heart rhythm) like dofetilide This list may not describe all possible interactions. Give your health care provider a list of all the medicines, herbs, non-prescription drugs, or dietary supplements you use. Also tell them if you smoke, drink alcohol, or use illegal drugs. Some items may interact with your medicine. What should I watch for while using this medication? Tell your care team if your symptoms do not improve. You may get drowsy or dizzy. Do not drive, use machinery, or do anything that needs mental alertness until you know how this medication affects you. Do not stand or sit up quickly, especially if you are an older patient. This reduces the risk of dizzy or fainting spells. Alcohol may interfere with the effect of this medication. Avoid alcoholic drinks. Your mouth may get dry. Chewing sugarless gum or sucking hard candy, and drinking plenty of water  may help. Contact your care team if the problem does not go away or is severe. This medication may cause dry eyes and blurred vision. If you wear contact lenses you may feel some discomfort. Lubricating drops may help. See your eye care specialist if the problem does not go away or is severe. If you are receiving skin tests for allergies, tell your care team you are using this medication. What side effects may I notice from receiving this medication? Side effects that you should report to your care team as soon as  possible: Allergic reactions--skin rash, itching, hives, swelling of the face, lips, tongue, or throat Heart rhythm changes--fast or irregular heartbeat, dizziness, feeling faint or lightheaded, chest pain, trouble breathing Side effects that usually do not require medical attention (report to your care team if they continue or are bothersome): Confusion Drowsiness Dry mouth Hallucinations Headache This list may not describe all possible side effects. Call your doctor for medical advice about side effects. You may report side effects to FDA at 1-800-FDA-1088. Where should I keep my medication? Keep out of the reach of children and pets. Store at room temperature between 15 and 30 degrees C (59 and 86 degrees F). Keep container tightly closed. Throw away any unused medication after the expiration date. NOTE: This sheet is a summary. It may not cover all possible information. If you have questions about this medicine, talk to your doctor, pharmacist, or health care provider.  2023 Elsevier/Gold Standard (2020-12-04 00:00:00)

## 2022-04-29 ENCOUNTER — Ambulatory Visit: Payer: BC Managed Care – PPO | Admitting: Internal Medicine

## 2022-05-04 ENCOUNTER — Encounter: Payer: BC Managed Care – PPO | Attending: Internal Medicine | Admitting: Skilled Nursing Facility1

## 2022-05-04 DIAGNOSIS — E162 Hypoglycemia, unspecified: Secondary | ICD-10-CM | POA: Insufficient documentation

## 2022-05-04 DIAGNOSIS — Z713 Dietary counseling and surveillance: Secondary | ICD-10-CM | POA: Diagnosis not present

## 2022-05-04 NOTE — Progress Notes (Signed)
Bariatric Nutrition Follow-Up Visit Medical Nutrition Therapy   Sx date: 01/24/2018 Surgery [facility]  NUTRITION ASSESSMENT    Anthropometrics  Start weight at [facility]: 235 lbs  Today's weight: 145.6 lbs  Body Composition Scale 03/23/2022 05/04/2022  Weight  lbs 145.6 145.8  Total Body Fat  % 31.6 31.9     Visceral Fat 8 8  Fat-Free Mass  % 68.3 68     Total Body Water  % 48.6 48.5     Muscle-Mass  lbs 28.2 28  BMI 26.4 26.4  Body Fat Displacement ---         Torso  lbs 28.4 28.6        Left Leg  lbs 5.6 5.7        Right Leg  lbs 5.6 5.7        Left Arm  lbs 2.8 2.8        Right Arm  lbs 2.8 2.8   Clinical  Medical hx: heart murmur, GERD, asthma, anxiety, anemia, HTN, depression  Medications: zinc, b12, vitamin D, multivitamin and calcium   Labs: b12 611, folate 20.3, vitamin B6 25.4, total protein 5.8, ALT 43   Lifestyle & Dietary Hx   Pt states she has not had any issues and feels so much better she is amazed in a great way. Pt sates she is no longer having any sugar issues and feels well other than some stress with work recently.    Estimated daily fluid intake: 64+ oz Estimated daily protein intake: 90-100 g Supplements: zinc, b12, vitamin D, multivitamin and calcium  Current average weekly physical activity: ADL's due to feeling too tired    24-Hr Dietary Recall First Meal: premier protein shake + honey roasted almonds  + cranberries Snack:  watermelon Second Meal: wheat crackers and honey goat cheese Snack: sliced sourdough bread  Third Meal: egg + sausage and cheese or cheese and crackers Snack: sometimes beef jerky or sometimes yasso bar Beverages: 3 bottles of plain water, diet coke, zero water, bubbly water, water + flavoring, diet green tea  Post-Op Goals/ Signs/ Symptoms Using straws: no Drinking while eating: no Chewing/swallowing difficulties: no Changes in vision: no Changes to mood/headaches: no Hair loss/changes to skin/nails: no Difficulty  focusing/concentrating: no Sweating: no Limb weakness: no Dizziness/lightheadedness: no Palpitations: no  Carbonated/caffeinated beverages: no N/V/D/C/Gas: no; taking magnesium  Abdominal pain: no Dumping syndrome: no    NUTRITION INTERVENTION Nutrition counseling (C-1) and education (E-2) to facilitate bariatric surgery goals, including: The importance of consuming adequate calories as well as certain nutrients daily due to the body's need for essential vitamins, minerals, and fats The importance of daily physical activity and to reach a goal of at least 150 minutes of moderate to vigorous physical activity weekly (or as directed by their physician) due to benefits such as increased musculature and improved lab values The importance of intuitive eating specifically learning hunger-satiety cues and understanding the importance of learning a new body: The importance of mindful eating to avoid grazing behaviors  Encouraged patient to honor their body's internal hunger and fullness cues.  Throughout the day, check in mentally and rate hunger. Stop eating when satisfied not full regardless of how much food is left on the plate.  Get more if still hungry 20-30 minutes later.  The key is to honor satisfaction so throughout the meal, rate fullness factor and stop when comfortably satisfied not physically full. The key is to honor hunger and fullness without any feelings of guilt or  shame.  Pay attention to what the internal cues are, rather than any external factors. This will enhance the confidence you have in listening to your own body and following those internal cues enabling you to increase how often you eat when you are hungry not out of appetite and stop when you are satisfied not full.  Encouraged pt to continue to eat balanced meals inclusive of non starchy vegetables 2 times a day 7 days a week Encouraged pt to choose lean protein sources: limiting beef, pork, sausage, hotdogs, and lunch  meat Encourage pt to choose healthy fats such as plant based limiting animal fats Encouraged pt to continue to drink a minium 64 fluid ounces with half being plain water to satisfy proper hydration  Why you need complex carbohydrates: Whole grains and other complex carbohydrates are required to have a healthy diet. Whole grains provide fiber which can help with blood glucose levels and help keep you satiated. Fruits and starchy vegetables provide essential vitamins and minerals required for immune function, eyesight support, brain support, bone density, wound healing and many other functions within the body. According to the current evidenced based 2020-2025 Dietary Guidelines for Americans, complex carbohydrates are part of a healthy eating pattern which is associated with a decreased risk for type 2 diabetes, cancers, and cardiovascular disease.  Educated pt on the sources of B vitamins   Goals: Doristine Dev[employer]! Do not further reduce your diet for the sake of weight loss, if you want to lose weight increase your physical activity   Handouts Previously Provided Include  Meal ideas sheet  Learning Style & Readiness for Change Teaching method utilized: Visual & Auditory  Demonstrated degree of understanding via: Teach Back  Readiness Level: action Barriers to learning/adherence to lifestyle change: none identified   RD's Notes for Next Visit Assess adherence to pt chosen goals    MONITORING & EVALUATION Dietary intake, weekly physical activity, body weight, fatigue  Next Steps Patient is to follow-up as needed; if any questions or concerns please call or email.

## 2022-05-26 ENCOUNTER — Other Ambulatory Visit: Payer: Self-pay | Admitting: Psychiatry

## 2022-05-26 DIAGNOSIS — G4700 Insomnia, unspecified: Secondary | ICD-10-CM

## 2022-05-26 DIAGNOSIS — F439 Reaction to severe stress, unspecified: Secondary | ICD-10-CM

## 2022-06-03 ENCOUNTER — Other Ambulatory Visit: Payer: Self-pay | Admitting: Psychiatry

## 2022-06-03 ENCOUNTER — Ambulatory Visit (INDEPENDENT_AMBULATORY_CARE_PROVIDER_SITE_OTHER): Payer: BC Managed Care – PPO | Admitting: Psychiatry

## 2022-06-03 ENCOUNTER — Encounter: Payer: Self-pay | Admitting: Psychiatry

## 2022-06-03 VITALS — BP 114/69 | HR 88 | Temp 98.3°F | Wt 151.2 lb

## 2022-06-03 DIAGNOSIS — F4310 Post-traumatic stress disorder, unspecified: Secondary | ICD-10-CM

## 2022-06-03 DIAGNOSIS — G4700 Insomnia, unspecified: Secondary | ICD-10-CM

## 2022-06-03 DIAGNOSIS — F411 Generalized anxiety disorder: Secondary | ICD-10-CM | POA: Diagnosis not present

## 2022-06-03 MED ORDER — GABAPENTIN 300 MG PO CAPS: 300.0000 mg | ORAL_CAPSULE | Freq: Every day | ORAL | 0 refills | Status: DC

## 2022-06-03 MED ORDER — FLUOXETINE HCL 60 MG PO TABS: 60.0000 mg | ORAL_TABLET | Freq: Every day | ORAL | 1 refills | Status: DC

## 2022-06-03 MED ORDER — FLUOXETINE HCL 20 MG PO TABS: 20.0000 mg | ORAL_TABLET | Freq: Every day | ORAL | 1 refills | Status: DC

## 2022-06-03 NOTE — Progress Notes (Unsigned)
[name] OP Progress Note  06/03/2022 5:17 PM Selena [name]  MRN:  [id number]  Chief Complaint:  Chief Complaint  Patient presents with   Follow-up: 56 year old Caucasian female, divorced, employed, has a history of insomnia, trauma related symptoms presented for medication management.   HPI: Selena [name] is a 56 year old Caucasian female, employed, divorced, currently lives in [place], has a history of anxiety, trauma related symptoms, insomnia, gastric bypass surgery in 2017, history of pituitary adenoma, migraine headaches, gastroesophageal reflux disease, history of abnormal LFT, presented for medication management.  Patient today reports she has started EMDR, has had 3 sessions already.  She follows up with her therapist Dr. Arbutus Leas.  Patient reports she has started tapping, before going to sleep.  She reports that seems to be helpful with her sleep.  She does not believe the gabapentin helps with sleep.  She however continues to take it.  Denies side effects however is concerned about weight gain which has been going on since the past 6 months or so.  May have gained 25 pounds in the past 6 months.  Bodied since she has had gastric bypass surgery in the past and does not want to gain too much weight.  Patient reports anxiety is high in the morning since she has started going back to school.  She reports half of her classes going to come in tomorrow and then the other half on Friday and they will come back together on Tuesday.  Reports her current teacher assistant is not very helpful, is mute.  That does worsen her anxiety.  Patient reports she continues to be compliant on the Prozac.  Agreeable to dosage increase.  Currently takes the hydroxyzine as needed in the morning which helps to some extent.  Patient currently denies any suicidality, homicidality or perceptual disturbances.  Patient denies any other concerns today.  Visit Diagnosis:    ICD-10-CM   1. PTSD  (post-traumatic stress disorder)  F43.10 FLUoxetine HCl 60 MG TABS    FLUoxetine (PROZAC) 20 MG tablet    2. GAD (generalized anxiety disorder)  F41.1     3. Insomnia, unspecified type  G47.00 gabapentin (NEURONTIN) 300 MG capsule      Past Psychiatric History: Reviewed past psychiatric history from progress note on 04/22/2022.  Past trials of Wellbutrin, Celexa, nortriptyline, Paxil, Effexor, Klonopin, Requip.  Past Medical History:  Past Medical History:  Diagnosis Date   Anemia, iron deficiency    Anxiety    Asthma    Colon polyps 3614   Complication of anesthesia    per patient report , woke during endoscopy in 2014 ; no issues with subsequent colonscopy also  in 2014    Costochondritis    srturggled wiht it since age 60 ; exacerbates with excessive movement of left arm; did phsycial therapy in 08-2017 and hasnt had issues with it since    Depression    Eating disorder    Binge eating   Fibromyalgia    Gallstones    GERD (gastroesophageal reflux disease)    Heart murmur    at birth; resolved    Hyperlipidemia    Hypertension    was due to birth control  , " as soon as they took it out, it went away  "     Past Surgical History:  Procedure Laterality Date   CHOLECYSTECTOMY     COLONOSCOPY     [facility] dr Deatra Ina    COLONOSCOPY WITH PROPOFOL N/A 08/01/2021   Procedure: COLONOSCOPY  WITH PROPOFOL;  Surgeon: Virgel Manifold, MD;  Location: [facility] END[facility];  Service: Endoscopy;  Laterality: N/A;   EVALUATION UNDER ANESTHESIA WITH HEMORRHOIDECTOMY N/A 06/24/2018   Procedure: EXAM UNDER ANESTHESIA WITH EXTERNAL HEMORRHOIDECTOMY;  Surgeon: Johnathan Hausen, MD;  Location: [facility];  Service: General;  Laterality: N/A;   EXPLORATORY LAPAROTOMY  2000   for infertility   GANGLION CYST EXCISION     GASTRIC ROUX-EN-Y N/A 01/24/2018   Procedure: LAPAROSCOPIC ROUX-EN-Y GASTRIC BYPASS WITH UPPER ENDOSCOPY;  Surgeon: Johnathan Hausen, MD;  Location: [facility];  Service: General;   Laterality: N/A;   SPHINCTEROTOMY N/A 06/24/2018   Procedure: LATERAL INTERNAL SPHINCTEROTOMY;  Surgeon: Johnathan Hausen, MD;  Location: [facility];  Service: General;  Laterality: N/A;   TONSILLECTOMY  1989    Family Psychiatric History: Reviewed family psychiatric history from progress note on 04/22/2022.  Family History:  Family History  Problem Relation Age of Onset   Hyperlipidemia Mother    Heart disease Mother        Atrial fibrilation   Cancer Mother        ? Melanoma   Atrial fibrillation Mother    Depression Father    Bipolar disorder Father    Diabetes Father    Mental illness Father        Bipolar   Leukemia Father 28       MDS< then leukemia    Depression Sister    Cancer Sister        Clear cell sarcoma in leg   Hypothyroidism Sister    Arrhythmia Maternal Grandfather    Hypertension Maternal Grandmother    Heart disease Maternal Grandmother        Afib   Arrhythmia Maternal Grandmother    Arthritis Paternal Grandmother    ADD / ADHD Nephew    Anxiety disorder Nephew    ADD / ADHD Son    Autism spectrum disorder Son    Colon cancer Neg Hx    Rectal cancer Neg Hx    Pancreatic cancer Neg Hx     Social History: Reviewed social history from progress note on 04/22/2022. Social History   Socioeconomic History   Marital status: Divorced    Spouse name: Not on file   Number of children: 1   Years of education: Not on file   Highest education level: Master's degree (e.g., MA, MS, MEng, MEd, MSW, MBA)  Occupational History   Occupation: Teaches preschool    Employer: [employer]  Tobacco Use   Smoking status: Never    Passive exposure: Never   Smokeless tobacco: Never  Vaping Use   Vaping Use: Never used  Substance and Sexual Activity   Alcohol use: Not Currently    Comment: occasional   Drug use: No   Sexual activity: Yes    Birth control/protection: I.U.D.  Other Topics Concern   Not on file  Social History Narrative   2 Step children;  asthma, ADHD   Social Determinants of Health   Financial Resource Strain: Not on file  Food Insecurity: Not on file  Transportation Needs: Not on file  Physical Activity: Not on file  Stress: Not on file  Social Connections: Not on file    Allergies:  Allergies  Allergen Reactions   Fish Allergy Nausea And Vomiting   Augmentin [Amoxicillin-Pot Clavulanate] Diarrhea    Can take regular amoxicillin with no issues Has patient had a PCN reaction causing immediate rash, facial/tongue/throat swelling, SOB or lightheadedness with hypotension: No  Has patient had a PCN reaction causing severe rash involving mucus membranes or skin necrosis: No Has patient had a PCN reaction that required hospitalization: No Has patient had a PCN reaction occurring within the last 10 years: Yes--but DIARRHEA ONLY If all of the above answers are "NO", then may proceed with Cephalosporin [place]   Citalopram Other (See Comments)    Tachycardia    Eggs Or Egg-Derived Products Diarrhea and Nausea And Vomiting   Sulfonamide Derivatives Itching    Metabolic Disorder Labs: Lab Results  Component Value Date   HGBA1C 5.6 01/16/2022   Lab Results  Component Value Date   PROLACTIN 5.7 01/16/2022   PROLACTIN 7.5 09/05/2015   Lab Results  Component Value Date   CHOL 158 06/11/2021   TRIG 68.0 06/11/2021   HDL 58.30 06/11/2021   CHOLHDL 3 06/11/2021   VLDL 13.6 06/11/2021   LDLCALC 86 06/11/2021   LDLCALC 89 04/05/2019   Lab Results  Component Value Date   TSH 0.90 01/16/2022   TSH 0.95 12/10/2021    Therapeutic Level Labs: No results found for: "LITHIUM" No results found for: "VALPROATE" No results found for: "CBMZ"  Current Medications: Current Outpatient Medications  Medication Sig Dispense Refill   calcium gluconate 500 MG tablet Take 1 tablet by mouth 2 (two) times daily.      Efinaconazole 10 % SOLN Apply 1 drop topically daily. 4 mL 11   estradiol (VIVELLE-DOT) 0.025 MG/24HR Place 1 patch  onto the skin 2 (two) times a week.     FLUoxetine HCl 60 MG TABS Take 60 mg by mouth daily with breakfast. Take along with 20 mg daily - total of 80 mg daily 90 tablet 1   hydrOXYzine (ATARAX) 25 MG tablet TAKE 1-2 TABLETS (25-50 MG TOTAL) BY MOUTH AT BEDTIME AS NEEDED FOR ANXIETY. AND SLEEP 180 tablet 0   levonorgestrel (MIRENA) 20 MCG/24HR IUD 1 Intra Uterine Device (1 each total) by Intrauterine route once. 1 each 0   Multiple Vitamins-Iron (MULTI-VITAMIN/IRON PO) Take 1 tablet by mouth daily.      omeprazole (PRILOSEC OTC) 20 MG tablet Take 20 mg by mouth daily before breakfast.      rizatriptan (MAXALT) 10 MG tablet TAKE 1 TABLET BY MOUTH AS NEEDED FOR MIGRAINE. MAY REPEAT IN 2 HOURS IF NEEDED 10 tablet 2   topiramate (TOPAMAX) 100 MG tablet TAKE 1 TABLET BY MOUTH EVERY DAY 90 tablet 1   Vitamin D, Ergocalciferol, (DRISDOL) 1.25 MG (50000 UNIT) CAPS capsule TAKE 1 CAPSULE BY MOUTH ONE TIME PER WEEK 12 capsule 3   FLUoxetine (PROZAC) 20 MG tablet Take 1 tablet (20 mg total) by mouth daily. Take along with 60 mg daily - total of 80 mg daily 90 tablet 1   gabapentin (NEURONTIN) 300 MG capsule Take 1 capsule (300 mg total) by mouth daily after breakfast. 90 capsule 0   No current facility-administered medications for this visit.     Musculoskeletal: Strength & Muscle Tone: within normal limits Gait & Station: normal Patient leans: N/A  Psychiatric Specialty Exam: Review of Systems  Psychiatric/Behavioral:  Positive for sleep disturbance. The patient is nervous/anxious.   All other systems reviewed and are negative.   Blood pressure 114/69, pulse 88, temperature 98.3 F (36.8 C), temperature source Temporal, weight 151 lb 3.2 oz (68.6 kg).Body mass index is 27.65 kg/m.  General Appearance: Casual  Eye Contact:  Fair  Speech:  Clear and Coherent  Volume:  Normal  Mood:  Anxious  Affect:  Congruent  Thought Process:  Goal Directed and Descriptions of Associations: Intact   Orientation:  Full (Time, Place, and Person)  Thought Content: Logical   Suicidal Thoughts:  No  Homicidal Thoughts:  No  Memory:  Immediate;   Fair Recent;   Fair Remote;   Fair  Judgement:  Fair  [facility]:  Fair  Psychomotor Activity:  Normal  Concentration:  Concentration: Fair and Attention Span: Fair  Recall:  [employer] of Knowledge: Fair  Language: Fair  Akathisia:  No  Handed:  Right  AIMS (if indicated): not done  Assets:  Communication Skills Desire for [place] Talents/Skills Transportation  ADL's:  Intact  Cognition: WNL  Sleep:   Improving   Screenings: GAD-7    Flowsheet Row Office Visit from 06/03/2022 in [facility] Office Visit from 04/22/2022 in [facility] Video Visit from 06/12/2020 in [facility]  Total GAD-7 Score '11 12 16      '$ PHQ2-9    [place] Visit from 06/03/2022 in [facility] Office Visit from 04/22/2022 in [facility] Visit from 12/10/2021 in [facility] Office Visit from 06/11/2021 in [facility] Video Visit from 03/21/2021 in [facility]  PHQ-2 Total Score '3 3 3 2 '$ 0  PHQ-9 Total Score '8 10 10 15 '$ 0      [place] Office Visit from 06/03/2022 in [facility] Admission (Discharged) from 08/01/2021 in [facility] No Risk No Risk        Assessment and Plan: Laaibah Wartman is a 56 year old Caucasian female, employed, divorced, has a history of PTSD, GAD, multiple medical problems as noted above, presented for medication management.  Patient continues to have anxiety, trauma related symptoms, sleep problems, will benefit from the following plan.  Plan  PTSD-unstable Increase Prozac to 80 mg p.o. daily Patient  advised to gabapentin 300 mg p.o. daily in the morning.  Advised to change the dosage times since she does not believe gabapentin is helpful with sleep.  She is already sleeping better.  Advised to take the gabapentin in the morning to see if that will help with her anxiety early in the morning. If not she could stop taking it. Continue EMDR with Dr. Arbutus Leas.  GAD-unstable Increase Prozac to 80 mg p.o. daily Continue CBT Hydroxyzine 25-50 mg p.o. nightly as needed for sleep, anxiety.  Insomnia-improving Continue sleep hygiene techniques. Also has hydroxyzine available as noted above.  Pending sleep study report-patient was advised to sign an ROI.  Discussed lifestyle modification, diet management, exercise to help with weight loss.  Patient could also follow up with her primary care provider.  Follow-up in clinic in 8 weeks or sooner if needed.  This note was generated in part or whole with voice recognition software. Voice recognition is usually quite accurate but there are transcription errors that can and very often do occur. I apologize for any typographical errors that were not detected and corrected.      Ursula Alert, MD 06/03/2022, 5:17 PM

## 2022-06-04 NOTE — Telephone Encounter (Signed)
I have changed the Prozac to 40 mg-take 2 capsules, since 60 mg is not covered by health insurance plan.

## 2022-06-12 ENCOUNTER — Ambulatory Visit: Payer: BC Managed Care – PPO | Admitting: Internal Medicine

## 2022-06-29 ENCOUNTER — Encounter: Payer: Self-pay | Admitting: Internal Medicine

## 2022-06-29 ENCOUNTER — Ambulatory Visit: Payer: BC Managed Care – PPO | Admitting: Internal Medicine

## 2022-06-29 VITALS — BP 110/72 | HR 78 | Temp 97.6°F | Ht 62.0 in | Wt 147.0 lb

## 2022-06-29 DIAGNOSIS — Z23 Encounter for immunization: Secondary | ICD-10-CM

## 2022-06-29 DIAGNOSIS — K146 Glossodynia: Secondary | ICD-10-CM

## 2022-06-29 DIAGNOSIS — F411 Generalized anxiety disorder: Secondary | ICD-10-CM

## 2022-06-29 DIAGNOSIS — D126 Benign neoplasm of colon, unspecified: Secondary | ICD-10-CM | POA: Diagnosis not present

## 2022-06-29 DIAGNOSIS — G2581 Restless legs syndrome: Secondary | ICD-10-CM | POA: Diagnosis not present

## 2022-06-29 MED ORDER — PRAMIPEXOLE DIHYDROCHLORIDE 0.125 MG PO TABS
0.1250 mg | ORAL_TABLET | Freq: Three times a day (TID) | ORAL | 2 refills | Status: AC
Start: 2022-06-29 — End: ?

## 2022-06-29 NOTE — Assessment & Plan Note (Signed)
She had a suboptimay prep for her 2022  Colonoscopy:     "Repeat colonoscopy in 3 years, with 2 day prep, because the bowel preparation was suboptimal. (Colon was cleaned with suction and water. Due to the fair prep, which was able to be partially cleaned, repeat colonoscopy is being recommended in 3 years. Since she had a colonoscopy in 2014 already and visualization was fair on this one, a repeat colonoscopy earlier than 3 years is not needed)."

## 2022-06-29 NOTE — Patient Instructions (Addendum)
We are resuming pramiprexole for restless legs  Start with 1 tablet at or before bedtime   You may increase the dose after one week if needed    Expect professionalism from your colleague.  If you don't ask for it,  you definitely wont get it!

## 2022-06-29 NOTE — Assessment & Plan Note (Signed)
Resolved with b vitamin and zinc suppleents
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
## 2022-06-29 NOTE — Assessment & Plan Note (Signed)
Resuming mirapex for RLS starting dose 0.125 mg qhs titrate up as needed weekly by 0.125 mg
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
## 2022-07-07 ENCOUNTER — Telehealth: Payer: Self-pay

## 2022-07-07 NOTE — Telephone Encounter (Signed)
Please advise patient that medication changes will be made at her visit and we could place her on a wait list and contact her for a sooner appointment if available.  If she is in a crisis she will have to go to an urgent care.

## 2022-07-07 NOTE — Telephone Encounter (Signed)
pt called left messae that the gabapentin is not helping her anxiety and she is not sleeping. can she try something else?

## 2022-07-08 NOTE — Telephone Encounter (Signed)
left message to call office back.  

## 2022-07-09 ENCOUNTER — Ambulatory Visit: Payer: Self-pay

## 2022-07-09 ENCOUNTER — Other Ambulatory Visit: Payer: Self-pay | Admitting: Family Medicine

## 2022-07-09 ENCOUNTER — Ambulatory Visit: Payer: BC Managed Care – PPO | Admitting: Family Medicine

## 2022-07-09 DIAGNOSIS — M79672 Pain in left foot: Secondary | ICD-10-CM

## 2022-07-09 NOTE — Telephone Encounter (Signed)
OK with virtual if patient is OK , otherwise pls schedule in person another day.

## 2022-07-09 NOTE — Telephone Encounter (Signed)
pt notified of the instruction per dr. Shea Evans.

## 2022-07-13 ENCOUNTER — Telehealth: Payer: BC Managed Care – PPO | Admitting: Psychiatry

## 2022-07-14 ENCOUNTER — Telehealth (INDEPENDENT_AMBULATORY_CARE_PROVIDER_SITE_OTHER): Payer: BC Managed Care – PPO | Admitting: Psychiatry

## 2022-07-14 ENCOUNTER — Encounter: Payer: Self-pay | Admitting: Psychiatry

## 2022-07-14 DIAGNOSIS — F4310 Post-traumatic stress disorder, unspecified: Secondary | ICD-10-CM

## 2022-07-14 DIAGNOSIS — F411 Generalized anxiety disorder: Secondary | ICD-10-CM | POA: Diagnosis not present

## 2022-07-14 DIAGNOSIS — G4700 Insomnia, unspecified: Secondary | ICD-10-CM

## 2022-07-14 MED ORDER — TRAZODONE HCL 100 MG PO TABS: 100.0000 mg | ORAL_TABLET | Freq: Every day | ORAL | 1 refills | Status: DC

## 2022-07-14 NOTE — Patient Instructions (Signed)
Trazodone Tablets What is this medication? TRAZODONE (TRAZ oh done) treats depression. It increases the amount of serotonin in the brain, a hormone that helps regulate mood. This medicine may be used for other purposes; ask your health care provider or pharmacist if you have questions. COMMON BRAND NAME(S): Desyrel What should I tell my care team before I take this medication? They need to know if you have any of these conditions: Attempted suicide or thinking about it Bipolar disorder Bleeding problems Glaucoma Heart disease, or previous heart attack Irregular heart beat Kidney or liver disease Low levels of sodium in the blood An unusual or allergic reaction to trazodone, other medications, foods, dyes or preservatives Pregnant or trying to get pregnant Breast-feeding How should I use this medication? Take this medication by mouth with a glass of water. Follow the directions on the prescription label. Take this medication shortly after a meal or a light snack. Take your medication at regular intervals. Do not take your medication more often than directed. Do not stop taking this medication suddenly except upon the advice of your care team. Stopping this medication too quickly may cause serious side effects or your condition may worsen. A special MedGuide will be given to you by the pharmacist with each prescription and refill. Be sure to read this information carefully each time. Talk to your care team regarding the use of this medication in children. Special care may be needed. Overdosage: If you think you have taken too much of this medicine contact a poison control center or emergency room at once. NOTE: This medicine is only for you. Do not share this medicine with others. What if I miss a dose? If you miss a dose, take it as soon as you can. If it is almost time for your next dose, take only that dose. Do not take double or extra doses. What may interact with this medication? Do not  take this medication with any of the following: Certain medications for fungal infections like fluconazole, itraconazole, ketoconazole, posaconazole, voriconazole Cisapride Dronedarone Linezolid MAOIs like Carbex, Eldepryl, Marplan, Nardil, and Parnate Mesoridazine Methylene blue (injected into a vein) Pimozide Saquinavir Thioridazine This medication may also interact with the following: Alcohol Antiviral medications for HIV or AIDS Aspirin and aspirin-like medications Barbiturates like phenobarbital Certain medications for blood pressure, heart disease, irregular heart beat Certain medications for depression, anxiety, or psychotic disturbances Certain medications for migraine headache like almotriptan, eletriptan, frovatriptan, naratriptan, rizatriptan, sumatriptan, zolmitriptan Certain medications for seizures like carbamazepine and phenytoin Certain medications for sleep Certain medications that treat or prevent blood clots like dalteparin, enoxaparin, warfarin Digoxin Fentanyl Lithium NSAIDS, medications for pain and inflammation, like ibuprofen or naproxen Other medications that prolong the QT interval (cause an abnormal heart rhythm) like dofetilide Rasagiline Supplements like St. John's wort, kava kava, valerian Tramadol Tryptophan This list may not describe all possible interactions. Give your health care provider a list of all the medicines, herbs, non-prescription drugs, or dietary supplements you use. Also tell them if you smoke, drink alcohol, or use illegal drugs. Some items may interact with your medicine. What should I watch for while using this medication? Tell your care team if your symptoms do not get better or if they get worse. Visit your care team for regular checks on your progress. Because it may take several weeks to see the full effects of this medication, it is important to continue your treatment as prescribed by your care team. Watch for new or worsening  thoughts of  suicide or depression. This includes sudden changes in mood, behaviors, or thoughts. These changes can happen at any time but are more common in the beginning of treatment or after a change in dose. Call your care team right away if you experience these thoughts or worsening depression. Manic episodes may happen in patients with bipolar disorder who take this medication. Watch for changes in feelings or behaviors such as feeling anxious, nervous, agitated, panicky, irritable, hostile, aggressive, impulsive, severely restless, overly excited and hyperactive, or trouble sleeping. These changes can happen at any time but are more common in the beginning of treatment or after a change in dose. Call your care team right away if you notice any of these symptoms. You may get drowsy or dizzy. Do not drive, use machinery, or do anything that needs mental alertness until you know how this medication affects you. Do not stand or sit up quickly, especially if you are an older patient. This reduces the risk of dizzy or fainting spells. Alcohol may interfere with the effect of this medication. Avoid alcoholic drinks. This medication may cause dry eyes and blurred vision. If you wear contact lenses you may feel some discomfort. Lubricating drops may help. See your eye doctor if the problem does not go away or is severe. Your mouth may get dry. Chewing sugarless gum, sucking hard candy and drinking plenty of water may help. Contact your care team if the problem does not go away or is severe. What side effects may I notice from receiving this medication? Side effects that you should report to your care team as soon as possible: Allergic reactions--skin rash, itching, hives, swelling of the face, lips, tongue, or throat Bleeding--bloody or black, tar-like stools, red or dark brown urine, vomiting blood or brown material that looks like coffee grounds, small, red or purple spots on skin, unusual bleeding or  bruising Heart rhythm changes--fast or irregular heartbeat, dizziness, feeling faint or lightheaded, chest pain, trouble breathing Low blood pressure--dizziness, feeling faint or lightheaded, blurry vision Low sodium level--muscle weakness, fatigue, dizziness, headache, confusion Prolonged or painful erection Serotonin syndrome--irritability, confusion, fast or irregular heartbeat, muscle stiffness, twitching muscles, sweating, high fever, seizures, chills, vomiting, diarrhea Sudden eye pain or change in vision such as blurry vision, seeing halos around lights, vision loss Thoughts of suicide or self-harm, worsening mood, feelings of depression Side effects that usually do not require medical attention (report to your care team if they continue or are bothersome): Change in sex drive or performance Constipation Dizziness Drowsiness Dry mouth This list may not describe all possible side effects. Call your doctor for medical advice about side effects. You may report side effects to FDA at 1-800-FDA-1088. Where should I keep my medication? Keep out of the reach of children and pets. Store at room temperature between 15 and 30 degrees C (59 to 86 degrees F). Protect from light. Keep container tightly closed. Throw away any unused medication after the expiration date. NOTE: This sheet is a summary. It may not cover all possible information. If you have questions about this medicine, talk to your doctor, pharmacist, or health care provider.  2023 Elsevier/Gold Standard (2020-09-11 00:00:00)  

## 2022-07-14 NOTE — Progress Notes (Unsigned)
Virtual Visit via Video Note  I connected with Selena [name] on 07/14/22 at 10:30 AM EDT by a video enabled telemedicine application and verified that I am speaking with the correct person using two identifiers.  Location Provider Location : [facility] Patient Location : Work  Participants: Patient , Provider    I discussed the limitations of evaluation and management by telemedicine and the availability of in person appointments. The patient expressed understanding and agreed to proceed.    I discussed the assessment and treatment plan with the patient. The patient was provided an opportunity to ask questions and all were answered. The patient agreed with the plan and demonstrated an understanding of the instructions.   The patient was advised to call back or seek an in-person evaluation if the symptoms worsen or if the condition fails to improve as anticipated.  Video connection was lost at less than 50% of the duration of the visit, at which time the remainder of the visit was completed through audio only       [facility] MD OP Progress Note  07/15/2022 8:22 AM Aliyana Dlugosz  MRN:  [id number]  Chief Complaint:  Chief Complaint  Patient presents with   Follow-up   Anxiety   Insomnia   HPI: Selena [name] is a 56 year old Caucasian female, employed, divorced, currently lives in [place], has a history of anxiety, PTSD, insomnia, history of gastric bypass surgery in 2017, history of pituitary adenoma, migraine headaches, gastroesophageal reflux disease, history of abnormal LFT was evaluated by telemedicine today.  Patient reports she is currently struggling with regards to her anxiety and trauma related symptoms.  She reports she feels on edge and anxious and overwhelmed when her students touch her.  She did not have these kind of problems before when she was in class with her students.  That has been frustrating for her.  Patient reports she also struggles with sleep.  She  is currently taking pramipexole for restless leg symptoms.  In spite of that she has not been sleeping at night.  She reports when she is back from work she crashes and sleeps on her recliner for a few hours.  Hence does not have a good sleep hygiene.  Does feel anxious at night.  Unknown if anxiety affecting sleep at night.  May have tried trazodone in the past and reports she did not have any side effects.  Only tried a low dosage.  Patient continues to be in psychotherapy sessions, Dr. Elyn Aquas basis, currently receiving EMDR.  That does not seem to have helped much with her PTSD symptoms.  Patient currently denies any suicidality, homicidality or perceptual disturbances.  Patient denies any other concerns today.  Visit Diagnosis:    ICD-10-CM   1. PTSD (post-traumatic stress disorder)  F43.10 traZODone (DESYREL) 100 MG tablet    2. GAD (generalized anxiety disorder)  F41.1     3. Insomnia, unspecified type  G47.00 traZODone (DESYREL) 100 MG tablet      Past Psychiatric History: Reviewed past psychiatric history from progress note on 04/22/2022.  Past trials of Wellbutrin, Celexa, nortriptyline, Paxil, Effexor, Klonopin, Requip, trazodone.  Past Medical History:  Past Medical History:  Diagnosis Date   Anemia, iron deficiency    Anxiety    Asthma    Colon polyps 5277   Complication of anesthesia    per patient report , woke during endoscopy in 2014 ; no issues with subsequent colonscopy also  in 2014    Costochondritis    srturggled  wiht it since age 25 ; exacerbates with excessive movement of left arm; did phsycial therapy in 08-2017 and hasnt had issues with it since    Depression    Eating disorder    Binge eating   Fibromyalgia    Gallstones    GERD (gastroesophageal reflux disease)    Heart murmur    at birth; resolved    Hyperlipidemia    Hypertension    was due to birth control  , " as soon as they took it out, it went away  "     Past Surgical History:   Procedure Laterality Date   CHOLECYSTECTOMY     COLONOSCOPY     [facility] dr Deatra Ina    COLONOSCOPY WITH PROPOFOL N/A 08/01/2021   Procedure: COLONOSCOPY WITH PROPOFOL;  Surgeon: Virgel Manifold, MD;  Location: [facility] END[facility]COPY;  Service: Endoscopy;  Laterality: N/A;   EVALUATION UNDER ANESTHESIA WITH HEMORRHOIDECTOMY N/A 06/24/2018   Procedure: EXAM UNDER ANESTHESIA WITH EXTERNAL HEMORRHOIDECTOMY;  Surgeon: Johnathan Hausen, MD;  Location: [facility];  Service: General;  Laterality: N/A;   EXPLORATORY LAPAROTOMY  2000   for infertility   GANGLION CYST EXCISION     GASTRIC ROUX-EN-Y N/A 01/24/2018   Procedure: LAPAROSCOPIC ROUX-EN-Y GASTRIC BYPASS WITH UPPER ENDOSCOPY;  Surgeon: Johnathan Hausen, MD;  Location: [facility];  Service: General;  Laterality: N/A;   SPHINCTEROTOMY N/A 06/24/2018   Procedure: LATERAL INTERNAL SPHINCTEROTOMY;  Surgeon: Johnathan Hausen, MD;  Location: [facility];  Service: General;  Laterality: N/A;   TONSILLECTOMY  1989    Family Psychiatric History: Reviewed family psychiatric history from progress note on 04/22/2022.  Family History:  Family History  Problem Relation Age of Onset   Hyperlipidemia Mother    Heart disease Mother        Atrial fibrilation   Cancer Mother        ? Melanoma   Atrial fibrillation Mother    Depression Father    Bipolar disorder Father    Diabetes Father    Mental illness Father        Bipolar   Leukemia Father 35       MDS< then leukemia    Depression Sister    Cancer Sister        Clear cell sarcoma in leg   Hypothyroidism Sister    Arrhythmia Maternal Grandfather    Hypertension Maternal Grandmother    Heart disease Maternal Grandmother        Afib   Arrhythmia Maternal Grandmother    Arthritis Paternal Grandmother    ADD / ADHD Nephew    Anxiety disorder Nephew    ADD / ADHD Son    Autism spectrum disorder Son    Colon cancer Neg Hx    Rectal cancer Neg Hx    Pancreatic cancer Neg Hx     Social History:  Reviewed social history from progress note on 04/22/2022. Social History   Socioeconomic History   Marital status: Divorced    Spouse name: Not on file   Number of children: 1   Years of education: Not on file   Highest education level: Master's degree (e.g., MA, MS, MEng, MEd, MSW, MBA)  Occupational History   Occupation: Teaches preschool    Employer: [employer]  Tobacco Use   Smoking status: Never    Passive exposure: Never   Smokeless tobacco: Never  Vaping Use   Vaping Use: Never used  Substance and Sexual Activity   Alcohol use: Not  Currently    Comment: occasional   Drug use: No   Sexual activity: Yes    Birth control/protection: I.U.D.  Other Topics Concern   Not on file  Social History Narrative   2 Step children; asthma, ADHD   Social Determinants of Health   Financial Resource Strain: Not on file  Food Insecurity: Not on file  Transportation Needs: Not on file  Physical Activity: Not on file  Stress: Not on file  Social Connections: Not on file    Allergies:  Allergies  Allergen Reactions   Fish Allergy Nausea And Vomiting   Augmentin [Amoxicillin-Pot Clavulanate] Diarrhea    Can take regular amoxicillin with no issues Has patient had a PCN reaction causing immediate rash, facial/tongue/throat swelling, SOB or lightheadedness with hypotension: No Has patient had a PCN reaction causing severe rash involving mucus membranes or skin necrosis: No Has patient had a PCN reaction that required hospitalization: No Has patient had a PCN reaction occurring within the last 10 years: Yes--but DIARRHEA ONLY If all of the above answers are "NO", then may proceed with Cephalosporin [place]   Citalopram Other (See Comments)    Tachycardia    Eggs Or Egg-Derived Products Diarrhea and Nausea And Vomiting   Requip [Ropinirole]     Not tolerated    Sulfonamide Derivatives Itching    Metabolic Disorder Labs: Lab Results  Component Value Date   HGBA1C 5.6  01/16/2022   Lab Results  Component Value Date   PROLACTIN 5.7 01/16/2022   PROLACTIN 7.5 09/05/2015   Lab Results  Component Value Date   CHOL 158 06/11/2021   TRIG 68.0 06/11/2021   HDL 58.30 06/11/2021   CHOLHDL 3 06/11/2021   VLDL 13.6 06/11/2021   LDLCALC 86 06/11/2021   LDLCALC 89 04/05/2019   Lab Results  Component Value Date   TSH 0.90 01/16/2022   TSH 0.95 12/10/2021    Therapeutic Level Labs: No results found for: "LITHIUM" No results found for: "VALPROATE" No results found for: "CBMZ"  Current Medications: Current Outpatient Medications  Medication Sig Dispense Refill   calcium gluconate 500 MG tablet Take 1 tablet by mouth 2 (two) times daily.      Efinaconazole 10 % SOLN Apply 1 drop topically daily. 4 mL 11   estradiol (VIVELLE-DOT) 0.025 MG/24HR Place 1 patch onto the skin 2 (two) times a week.     FLUoxetine (PROZAC) 40 MG capsule Take 2 capsules (80 mg total) by mouth daily. 180 capsule 0   gabapentin (NEURONTIN) 300 MG capsule Take 1 capsule (300 mg total) by mouth daily after breakfast. 90 capsule 0   hydrOXYzine (ATARAX) 25 MG tablet TAKE 1-2 TABLETS (25-50 MG TOTAL) BY MOUTH AT BEDTIME AS NEEDED FOR ANXIETY. AND SLEEP 180 tablet 0   levonorgestrel (MIRENA) 20 MCG/24HR IUD 1 Intra Uterine Device (1 each total) by Intrauterine route once. 1 each 0   Multiple Vitamins-Iron (MULTI-VITAMIN/IRON PO) Take 1 tablet by mouth daily.      omeprazole (PRILOSEC OTC) 20 MG tablet Take 20 mg by mouth daily before breakfast.      pramipexole (MIRAPEX) 0.125 MG tablet Take 1 tablet (0.125 mg total) by mouth 3 (three) times daily. 90 tablet 2   rizatriptan (MAXALT) 10 MG tablet TAKE 1 TABLET BY MOUTH AS NEEDED FOR MIGRAINE. MAY REPEAT IN 2 HOURS IF NEEDED 10 tablet 2   topiramate (TOPAMAX) 100 MG tablet TAKE 1 TABLET BY MOUTH EVERY DAY 90 tablet 1   traZODone (DESYREL) 100 MG  tablet Take 1 tablet (100 mg total) by mouth at bedtime. For sleep 30 tablet 1   Vitamin D,  Ergocalciferol, (DRISDOL) 1.25 MG (50000 UNIT) CAPS capsule TAKE 1 CAPSULE BY MOUTH ONE TIME PER WEEK 12 capsule 3   No current facility-administered medications for this visit.     Musculoskeletal: Strength & Muscle Tone:  UTA Gait & Station:  UTA Patient leans: N/A  Psychiatric Specialty Exam: Review of Systems  Psychiatric/Behavioral:  Positive for sleep disturbance. The patient is nervous/anxious.   All other systems reviewed and are negative.   There were no vitals taken for this visit.There is no height or weight on file to calculate BMI.  General Appearance:  UTA  Eye Contact:   UTA  Speech:  Clear and Coherent  Volume:  Normal  Mood:  Anxious  Affect:   UTA  Thought Process:  Goal Directed and Descriptions of Associations: Intact  Orientation:  Full (Time, Place, and Person)  Thought Content: Logical   Suicidal Thoughts:  No  Homicidal Thoughts:  No  Memory:  Immediate;   Fair Recent;   Fair Remote;   Fair  Judgement:  Fair  Insight:  Fair  Psychomotor Activity:   UTA  Concentration:  Concentration: Fair and Attention Span: Fair  Recall:  [employer] of Knowledge: Fair  Language: Fair  Akathisia:  No  Handed:  Right  AIMS (if indicated): not done  Assets:  Communication Skills Desire for Improvement Housing Social Support  ADL's:  Intact  Cognition: WNL  Sleep:  Poor   Screenings: GAD-7    Flowsheet Row Video Visit from 07/14/2022 in [place] Office Visit from 06/03/2022 in [place] Office Visit from 04/22/2022 in [place] Video Visit from 06/12/2020 in [facility]  Total GAD-7 Score '12 11 12 16      '$ PHQ2-9    [name] Video Visit from 07/14/2022 in [place] Office Visit from 06/29/2022 in [place] Office Visit from 06/03/2022 in [place] Visit  from 04/22/2022 in [place] Visit from 12/10/2021 in [place]  PHQ-2 Total Score '1 5 3 3 3  '$ PHQ-9 Total Score -- '19 8 10 10      '$ Flowsheet Row Video Visit from 07/14/2022 in [place] Office Visit from 06/03/2022 in [place] Admission (Discharged) from 08/01/2021 in [facility] No Risk No Risk No Risk        Assessment and Plan: Avianah Pellman is a 56 year old Caucasian female, employed, divorced, has a history of PTSD, GAD, multiple medical problems was evaluated by telemedicine today.  Patient is currently struggling with anxiety, trauma related symptoms, sleep problems.  Patient will benefit from the following plan.  Plan PTSD-unstable Prozac 80 mg p.o. daily Gabapentin 300 mg p.o. daily in the morning-prescribed by primary care provider. Start trazodone 100 mg p.o. nightly for sleep Discussed sleep hygiene techniques.  GAD-unstable Prozac 80 mg p.o. daily Patient to continue CBT Dr.Edward Lurey. Hydroxyzine 25-50 mg p.o. nightly as needed for sleep as well as anxiety.  Patient advised to take 12.5 mg during the day as needed if she needs to take it while she is at work.   Insomnia-unstable Start trazodone 100 mg p.o. nightly for sleep Also continue pramipexole as prescribed for restless leg symptoms.  Pending sleep study report.  Follow-up in clinic in 2 weeks or sooner if needed.   Collaboration of Care: Collaboration of Care: Referral or follow-up with counselor/therapist AEB encouraged to continue follow-up with therapist.  Patient/Guardian was advised Release of Information must be obtained prior to any record release in order to collaborate their care with an outside provider. Patient/Guardian was advised if they have not already done so to contact the registration department to sign all  necessary forms in order for [place] to release information regarding their care.   Consent: Patient/Guardian gives verbal consent for treatment and assignment of benefits for services provided during this visit. Patient/Guardian expressed understanding and agreed to proceed.    I have spent at least 18 minutes non face to face with patient today .  This note was generated in part or whole with voice recognition software. Voice recognition is usually quite accurate but there are transcription errors that can and very often do occur. I apologize for any typographical errors that were not detected and corrected.      Ursula Alert, MD 07/15/2022, 8:22 AM

## 2022-07-21 ENCOUNTER — Ambulatory Visit: Payer: Self-pay

## 2022-07-21 ENCOUNTER — Other Ambulatory Visit: Payer: Self-pay | Admitting: Family Medicine

## 2022-07-21 DIAGNOSIS — M25572 Pain in left ankle and joints of left foot: Secondary | ICD-10-CM

## 2022-07-22 ENCOUNTER — Telehealth: Payer: BC Managed Care – PPO | Admitting: Psychiatry

## 2022-07-29 ENCOUNTER — Telehealth: Payer: Self-pay

## 2022-07-29 ENCOUNTER — Emergency Department: Payer: BC Managed Care – PPO

## 2022-07-29 ENCOUNTER — Encounter: Payer: Self-pay | Admitting: Medical Oncology

## 2022-07-29 ENCOUNTER — Emergency Department
Admission: EM | Admit: 2022-07-29 | Discharge: 2022-07-29 | Disposition: A | Discharge status: Home / Self Care | Payer: BC Managed Care – PPO | Attending: Emergency Medicine | Admitting: Emergency Medicine

## 2022-07-29 DIAGNOSIS — J45909 Unspecified asthma, uncomplicated: Secondary | ICD-10-CM | POA: Diagnosis not present

## 2022-07-29 DIAGNOSIS — F419 Anxiety disorder, unspecified: Secondary | ICD-10-CM | POA: Insufficient documentation

## 2022-07-29 DIAGNOSIS — R0789 Other chest pain: Secondary | ICD-10-CM | POA: Diagnosis present

## 2022-07-29 DIAGNOSIS — I10 Essential (primary) hypertension: Secondary | ICD-10-CM | POA: Insufficient documentation

## 2022-07-29 DIAGNOSIS — R079 Chest pain, unspecified: Secondary | ICD-10-CM

## 2022-07-29 LAB — CBC
HCT: 39.8 % (ref 36.0–46.0)
Hemoglobin: 13 g/dL (ref 12.0–15.0)
MCH: 30.6 pg (ref 26.0–34.0)
MCHC: 32.7 g/dL (ref 30.0–36.0)
MCV: 93.6 fL (ref 80.0–100.0)
Platelets: 322 10*3/uL (ref 150–400)
RBC: 4.25 MIL/uL (ref 3.87–5.11)
RDW: 11.9 % (ref 11.5–15.5)
WBC: 7.1 10*3/uL (ref 4.0–10.5)
nRBC: 0 % (ref 0.0–0.2)

## 2022-07-29 LAB — BASIC METABOLIC PANEL
Anion gap: 8 (ref 5–15)
BUN: 20 mg/dL (ref 6–20)
CO2: 19 mmol/L — ABNORMAL LOW (ref 22–32)
Calcium: 9 mg/dL (ref 8.9–10.3)
Chloride: 114 mmol/L — ABNORMAL HIGH (ref 98–111)
Creatinine, Ser: 0.85 mg/dL (ref 0.44–1.00)
GFR, Estimated: >60 mL/min (ref >60)
Glucose, Bld: 81 mg/dL (ref 70–99)
Potassium: 3.5 mmol/L (ref 3.5–5.1)
Sodium: 141 mmol/L (ref 135–145)

## 2022-07-29 LAB — TROPONIN I (HIGH SENSITIVITY)
Troponin I (High Sensitivity): 2 ng/L (ref <18)
Troponin I (High Sensitivity): 3 ng/L (ref <18)

## 2022-07-29 NOTE — Telephone Encounter (Signed)
Pt is currently at the ED

## 2022-07-29 NOTE — Telephone Encounter (Signed)
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  Patient states she thought she was getting the stomach bug.  Patient states she is home now and she feels fine.  Patient states she thinks she was experiencing a panic attack.  Patient states her chest is still sore.  I transferred call to Access Nurse.

## 2022-07-29 NOTE — ED Provider Notes (Signed)
[facility] Provider Note   Event Date/Time   First MD Initiated Contact with Patient 07/29/22 1941     (approximate) History  Chest Pain  HPI Selena [name] is a 56 y.o. female with a past medical history of asthma, hypertension, and migraines who presents for upper chest pain that was 2/10 in severity, stabbing, and nonradiating.  Patient states that this feels similar to anxiety attacks that she has had in the past and therefore tried to take deep breaths and calm herself down which did improve her chest pain.  Patient states that the symptoms lasted approximately 1 hour and resolve spontaneously.  Patient denies any complaints at this time ROS: Patient currently denies any vision changes, tinnitus, difficulty speaking, facial droop, sore throat, chest pain, shortness of breath, abdominal pain, nausea/vomiting/diarrhea, dysuria, or weakness/numbness/paresthesias in any extremity   Physical Exam  Triage Vital Signs: ED Triage Vitals  Enc Vitals Group     BP 07/29/22 1451 (!) 115/51     Pulse Rate 07/29/22 1451 92     Resp 07/29/22 1451 18     Temp 07/29/22 1451 (!) 97.5 F (36.4 C)     Temp Source 07/29/22 1451 Oral     SpO2 07/29/22 1451 98 %     Weight 07/29/22 1452 150 lb (68 kg)     Height 07/29/22 1452 '5\' 2"'$  (1.575 m)     Head Circumference --      Peak Flow --      Pain Score 07/29/22 1452 2     Pain Loc --      Pain Edu? --      Excl. in [place]? --    Most recent vital signs: Vitals:   07/29/22 2007 07/29/22 2044  BP:  110/84  Pulse: 77 68  Resp: 20 10  Temp:  97.6 F (36.4 C)  SpO2: 100% 100%   General: Awake, oriented x4. CV:  Good peripheral perfusion.  Resp:  Normal effort.  Abd:  No distention.  Other:  Middle-aged Caucasian female laying in bed in no acute distress ED Results / Procedures / Treatments  Labs (all labs ordered are listed, but only abnormal results are displayed) Labs Reviewed  BASIC METABOLIC PANEL - Abnormal;  Notable for the following components:      Result Value   Chloride 114 (*)    CO2 19 (*)    All other components within normal limits  CBC  POC URINE PREG, ED  TROPONIN I (HIGH SENSITIVITY)  TROPONIN I (HIGH SENSITIVITY)   EKG ED ECG REPORT I, Naaman Plummer, the attending physician, personally viewed and interpreted this ECG. Date: 07/29/2022 EKG Time: 1556 Rate: 71 Rhythm: normal sinus rhythm QRS Axis: normal Intervals: normal ST/T Wave abnormalities: normal Narrative Interpretation: no evidence of acute ischemia RADIOLOGY ED MD interpretation: 2 view chest x-ray interpreted by me shows no evidence of acute abnormalities including no pneumonia, pneumothorax, or widened mediastinum -Agree with radiology assessment Official radiology report(s): DG Chest 2 View  Result Date: 07/29/2022 CLINICAL DATA:  Chest pain EXAM: CHEST - 2 VIEW COMPARISON:  06/11/21 CXR FINDINGS: No pleural effusion. No pneumothorax. No focal airspace opacity. Normal cardiac and mediastinal contours. No displaced rib fractures. Vertebral body heights are maintained. IMPRESSION: No radiographic finding to explain chest pain. Electronically Signed   By: Marin Roberts M.D.   On: 07/29/2022 15:31   PROCEDURES: Critical Care performed: No .1-3 Lead EKG Interpretation  Performed by: Naaman Plummer, MD Authorized  by: Naaman Plummer, MD     Interpretation: normal     ECG rate:  67   ECG rate assessment: normal     Rhythm: sinus rhythm     Ectopy: none     Conduction: normal    MEDICATIONS ORDERED IN ED: Medications - No data to display IMPRESSION / MDM / [place] / ED COURSE  I reviewed the triage vital signs and the nursing notes.                             The patient is on the cardiac monitor to evaluate for evidence of arrhythmia and/or significant heart rate changes. Patient's presentation is most consistent with acute presentation with potential threat to life or bodily  function. Workup: ECG, CXR, CBC, BMP, Troponin Findings: ECG: No overt evidence of STEMI. No evidence of Brugadas sign, delta wave, epsilon wave, significantly prolonged QTc, or malignant arrhythmia HS Troponin: Negative x1 Other Labs unremarkable for emergent problems. CXR: Without PTX, PNA, or widened mediastinum Last Stress Test: Never Last Heart Catheterization: Never HEART Score: 2  Given History, Exam, and Workup I have low suspicion for ACS, Pneumothorax, Pneumonia, Pulmonary Embolus, Tamponade, Aortic Dissection or other emergent problem as a cause for this presentation.   Reassesment: Prior to discharge patients pain was controlled and they were well appearing.  Disposition:  Discharge. Strict return precautions discussed with patient with full understanding. Advised patient to follow up promptly with primary care provider    FINAL CLINICAL IMPRESSION(S) / ED DIAGNOSES   Final diagnoses:  Chest pain, unspecified type  Anxiety   Rx / DC Orders   ED Discharge Orders     None      Note:  This document was prepared using Dragon voice recognition software and may include unintentional dictation errors.   Naaman Plummer, MD 07/29/22 [id number]

## 2022-07-29 NOTE — ED Triage Notes (Signed)
Pt reports that she began having upper chest stabbing pain today. Pt reports that the pain took her breath. Pt in NAD at this time.

## 2022-07-30 ENCOUNTER — Other Ambulatory Visit
Admission: RE | Admit: 2022-07-30 | Discharge: 2022-07-30 | Disposition: A | Discharge status: Home / Self Care | Payer: BC Managed Care – PPO | Source: Ambulatory Visit | Attending: Psychiatry | Admitting: Psychiatry

## 2022-07-30 ENCOUNTER — Encounter: Payer: Self-pay | Admitting: Psychiatry

## 2022-07-30 ENCOUNTER — Ambulatory Visit (INDEPENDENT_AMBULATORY_CARE_PROVIDER_SITE_OTHER): Payer: BC Managed Care – PPO | Admitting: Psychiatry

## 2022-07-30 VITALS — BP 105/65 | HR 80 | Temp 98.3°F | Ht 62.0 in | Wt 149.6 lb

## 2022-07-30 DIAGNOSIS — Z79899 Other long term (current) drug therapy: Secondary | ICD-10-CM | POA: Insufficient documentation

## 2022-07-30 DIAGNOSIS — F411 Generalized anxiety disorder: Secondary | ICD-10-CM

## 2022-07-30 DIAGNOSIS — F410 Panic disorder [episodic paroxysmal anxiety] without agoraphobia: Secondary | ICD-10-CM

## 2022-07-30 DIAGNOSIS — G4700 Insomnia, unspecified: Secondary | ICD-10-CM | POA: Diagnosis not present

## 2022-07-30 DIAGNOSIS — F4310 Post-traumatic stress disorder, unspecified: Secondary | ICD-10-CM

## 2022-07-30 MED ORDER — CLONAZEPAM 0.5 MG PO TABS: 0.5000 mg | ORAL_TABLET | ORAL | 0 refills | Status: DC

## 2022-07-30 MED ORDER — BUSPIRONE HCL 10 MG PO TABS: 10.0000 mg | ORAL_TABLET | Freq: Two times a day (BID) | ORAL | 1 refills | Status: DC
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
## 2022-07-30 NOTE — Patient Instructions (Signed)
Clonazepam Tablets What is this medication? CLONAZEPAM (kloe NA ze pam) treats seizures. It may also be used to treat panic disorder. It works by helping your nervous system calm down. It belongs to a group of medications called benzodiazepines. This medicine may be used for other purposes; ask your health care provider or pharmacist if you have questions. COMMON BRAND NAME(S): Ceberclon, Klonopin What should I tell my care team before I take this medication? They need to know if you have any of these conditions: An alcohol or drug abuse problem Bipolar disorder, depression, psychosis or other mental health condition Glaucoma Kidney or liver disease Lung or breathing disease Myasthenia gravis Parkinson disease Porphyria Seizures or a history of seizures Suicidal thoughts An unusual or allergic reaction to clonazepam, other benzodiazepines, foods, dyes, or preservatives Pregnant or trying to get pregnant Breast-feeding How should I use this medication? Take this medication by mouth with a glass of water. Follow the directions on the prescription label. If it upsets your stomach, take it with food or milk. Take your medication at regular intervals. Do not take it more often than directed. Do not stop taking or change the dose except on the advice of your care team. A special MedGuide will be given to you by the pharmacist with each prescription and refill. Be sure to read this information carefully each time. Talk to your care team regarding the use of this medication in children. Special care may be needed. Overdosage: If you think you have taken too much of this medicine contact a poison control center or emergency room at once. NOTE: This medicine is only for you. Do not share this medicine with others. What if I miss a dose? If you miss a dose, take it as soon as you can. If it is almost time for your next dose, take only that dose. Do not take double or extra doses. What may interact  with this medication? Do not take this medication with any of the following: Narcotic medications for cough Sodium oxybate This medication may also interact with the following: Alcohol Antihistamines for allergy, cough and cold Antiviral medications for HIV or AIDS Certain medications for anxiety or sleep Certain medications for depression, like amitriptyline, fluoxetine, sertraline Certain medications for fungal infections like ketoconazole and itraconazole Certain medications for seizures like carbamazepine, phenobarbital, phenytoin, primidone General anesthetics like halothane, isoflurane, methoxyflurane, propofol Local anesthetics like lidocaine, pramoxine, tetracaine Medications that relax muscles for surgery Narcotic medications for pain Phenothiazines like chlorpromazine, mesoridazine, prochlorperazine, thioridazine This list may not describe all possible interactions. Give your health care provider a list of all the medicines, herbs, non-prescription drugs, or dietary supplements you use. Also tell them if you smoke, drink alcohol, or use illegal drugs. Some items may interact with your medicine. What should I watch for while using this medication? Tell your care team if your symptoms do not start to get better or if they get worse. Do not stop taking except on your care team's advice. You may develop a severe reaction. Your care team will tell you how much medication to take. You may get drowsy or dizzy. Do not drive, use machinery, or do anything that needs mental alertness until you know how this medication affects you. To reduce the risk of dizzy and fainting spells, do not stand or sit up quickly, especially if you are an older patient. Alcohol may increase dizziness and drowsiness. Avoid alcoholic drinks. If you are taking another medication that also causes drowsiness, you may   have more side effects. Give your care team a list of all medications you use. Your care team will tell  you how much medication to take. Do not take more medication than directed. Call emergency services if you have problems breathing or unusual sleepiness. The use of this medication may increase the chance of suicidal thoughts or actions. Pay special attention to how you are responding while on this medication. Any worsening of mood, or thoughts of suicide or dying should be reported to your care team right away. What side effects may I notice from receiving this medication? Side effects that you should report to your care team as soon as possible: Allergic reactions--skin rash, itching, hives, swelling of the face, lips, tongue, or throat CNS depression--slow or shallow breathing, shortness of breath, feeling faint, dizziness, confusion, trouble staying awake Thoughts of suicide or self-harm, worsening mood, feelings of depression Side effects that usually do not require medical attention (report to your care team if they continue or are bothersome): Dizziness Drowsiness Headache This list may not describe all possible side effects. Call your doctor for medical advice about side effects. You may report side effects to FDA at 1-800-FDA-1088. Where should I keep my medication? Keep out of the reach of children and pets. This medication can be abused. Keep your medication in a safe place to protect it from theft. Do not share this medication with anyone. Selling or giving away this medication is dangerous and against the law. Store at room temperature between 15 and 30 degrees C (59 and 86 degrees F). Protect from light. Keep container tightly closed. This medication may cause accidental overdose and death if taken by other adults, children, or pets. Mix any unused medication with a substance like cat litter or coffee grounds. Then throw the medication away in a sealed container like a sealed bag or a coffee can with a lid. Do not use the medication after the expiration date. NOTE: This sheet is a  summary. It may not cover all possible information. If you have questions about this medicine, talk to your doctor, pharmacist, or health care provider.  2023 Elsevier/Gold Standard (2020-09-23 00:00:00)

## 2022-07-31 LAB — URINE DRUGS OF ABUSE SCREEN W ALC, ROUTINE (REF LAB)
Amphetamines, Urine: NEGATIVE ng/mL
Barbiturate, Ur: NEGATIVE ng/mL
Benzodiazepine Quant, Ur: NEGATIVE ng/mL
Cannabinoid Quant, Ur: NEGATIVE ng/mL
Cocaine (Metab.): NEGATIVE ng/mL
Ethanol U, Quan: NEGATIVE %
Methadone Screen, Urine: NEGATIVE ng/mL
Opiate Quant, Ur: NEGATIVE ng/mL
Phencyclidine, Ur: NEGATIVE ng/mL
Propoxyphene, Urine: NEGATIVE ng/mL

## 2022-08-04 ENCOUNTER — Telehealth: Payer: Self-pay | Admitting: Psychiatry

## 2022-08-04 NOTE — Telephone Encounter (Signed)
I have completed FMLA form-08/10/2022 -11/17/ 2023-2 weeks.  Will have Mateo Flow CMA make sure there is an ROI on file and fax this to [facility].

## 2022-08-05 ENCOUNTER — Other Ambulatory Visit: Payer: Self-pay | Admitting: Psychiatry

## 2022-08-05 DIAGNOSIS — G4700 Insomnia, unspecified: Secondary | ICD-10-CM

## 2022-08-05 DIAGNOSIS — F4310 Post-traumatic stress disorder, unspecified: Secondary | ICD-10-CM

## 2022-08-21 ENCOUNTER — Other Ambulatory Visit: Payer: Self-pay | Admitting: Psychiatry

## 2022-08-21 DIAGNOSIS — F410 Panic disorder [episodic paroxysmal anxiety] without agoraphobia: Secondary | ICD-10-CM

## 2022-08-21 DIAGNOSIS — F411 Generalized anxiety disorder: Secondary | ICD-10-CM

## 2022-08-21 DIAGNOSIS — F4310 Post-traumatic stress disorder, unspecified: Secondary | ICD-10-CM

## 2022-08-24 ENCOUNTER — Encounter: Payer: Self-pay | Admitting: Psychiatry

## 2022-08-24 ENCOUNTER — Ambulatory Visit: Payer: BC Managed Care – PPO | Admitting: Psychiatry

## 2022-08-24 VITALS — BP 111/73 | HR 90 | Temp 98.4°F | Ht 62.0 in | Wt 151.0 lb

## 2022-08-24 DIAGNOSIS — G4701 Insomnia due to medical condition: Secondary | ICD-10-CM | POA: Diagnosis not present

## 2022-08-24 DIAGNOSIS — F411 Generalized anxiety disorder: Secondary | ICD-10-CM | POA: Diagnosis not present

## 2022-08-24 DIAGNOSIS — F410 Panic disorder [episodic paroxysmal anxiety] without agoraphobia: Secondary | ICD-10-CM | POA: Diagnosis not present

## 2022-08-24 DIAGNOSIS — F4310 Post-traumatic stress disorder, unspecified: Secondary | ICD-10-CM | POA: Diagnosis not present

## 2022-08-24 MED ORDER — FLUOXETINE HCL 40 MG PO CAPS: 80.0000 mg | ORAL_CAPSULE | Freq: Every day | ORAL | 0 refills | Status: DC

## 2022-08-24 MED ORDER — HYDROXYZINE HCL 25 MG PO TABS: 25.0000 mg | ORAL_TABLET | Freq: Every evening | ORAL | 0 refills | Status: DC | PRN

## 2022-08-24 NOTE — Progress Notes (Unsigned)
[facility] MD OP Progress Note  08/24/2022 5:18 PM Selena [name]  MRN:  [id number]  Chief Complaint:  Chief Complaint  Patient presents with   Follow-up   Medication Refill   Anxiety   Depression   HPI: Selena [name] is a 56 year old Caucasian female, employed, divorced, currently lives in [place], has a history of PTSD, GAD, insomnia, history of gastric bypass surgery in 2017, history of pituitary adenoma, migraine headaches, gastroesophageal reflux disease, history of abnormal LFT was evaluated in office today.  Patient today reports she is currently back at work, was out on [employer] for around 2 weeks.  She reports today was her first day back at work.  Reports she is currently getting some help at work since they realized in her absence how stressful her job environment was.  Patient reports she just has 2 more days to work and then she will be out for Thanksgiving break.  She also has Christmas break coming up in December.  She hence wants to give this a chance.  She also loves working with kids and hence she felt good to be back.  Reports overall anxiety symptoms as manageable at this time.  Reports she was able to sleep and get some rest while she was out on FMLA.  She is currently sleeping overall okay.  Patient reports the BuSpar has helped.  Denies side effects.  Would like to stay on the same dosage.  Also compliant on her other medications including Prozac.  Denies any suicidality, homicidality or perceptual disturbances.  Currently compliant with EMDR therapist.  Denies any other concerns today.  Visit Diagnosis:    ICD-10-CM   1. PTSD (post-traumatic stress disorder)  F43.10 FLUoxetine (PROZAC) 40 MG capsule    2. GAD (generalized anxiety disorder)  F41.1     3. Insomnia due to medical condition  G47.01 hydrOXYzine (ATARAX) 25 MG tablet   anxiety    4. Panic attack  F41.0       Past Psychiatric History: Reviewed past psychiatric history from progress note on  04/22/2022.  Past trials of Wellbutrin, Celexa, nortriptyline, Paxil, Effexor, Klonopin, Requip, trazodone.  Past Medical History:  Past Medical History:  Diagnosis Date   Anemia, iron deficiency    Anxiety    Asthma    Colon polyps 8413   Complication of anesthesia    per patient report , woke during endoscopy in 2014 ; no issues with subsequent colonscopy also  in 2014    Costochondritis    srturggled wiht it since age 77 ; exacerbates with excessive movement of left arm; did phsycial therapy in 08-2017 and hasnt had issues with it since    Depression    Eating disorder    Binge eating   Fibromyalgia    Gallstones    GERD (gastroesophageal reflux disease)    Heart murmur    at birth; resolved    Hyperlipidemia    Hypertension    was due to birth control  , " as soon as they took it out, it went away  "     Past Surgical History:  Procedure Laterality Date   CHOLECYSTECTOMY     COLONOSCOPY     [facility] dr Deatra Ina    COLONOSCOPY WITH PROPOFOL N/A 08/01/2021   Procedure: COLONOSCOPY WITH PROPOFOL;  Surgeon: Virgel Manifold, MD;  Location: [facility] ENDOSCOPY;  Service: Endoscopy;  Laterality: N/A;   EVALUATION UNDER ANESTHESIA WITH HEMORRHOIDECTOMY N/A 06/24/2018   Procedure: EXAM UNDER ANESTHESIA WITH EXTERNAL HEMORRHOIDECTOMY;  Surgeon: Johnathan Hausen, MD;  Location: [facility];  Service: General;  Laterality: N/A;   EXPLORATORY LAPAROTOMY  2000   for infertility   GANGLION CYST EXCISION     GASTRIC ROUX-EN-Y N/A 01/24/2018   Procedure: LAPAROSCOPIC ROUX-EN-Y GASTRIC BYPASS WITH UPPER ENDOSCOPY;  Surgeon: Johnathan Hausen, MD;  Location: [facility];  Service: General;  Laterality: N/A;   SPHINCTEROTOMY N/A 06/24/2018   Procedure: LATERAL INTERNAL SPHINCTEROTOMY;  Surgeon: Johnathan Hausen, MD;  Location: [facility];  Service: General;  Laterality: N/A;   TONSILLECTOMY  1989    Family Psychiatric History: Reviewed family psychiatric history from progress note on  04/22/2022.  Family History:  Family History  Problem Relation Age of Onset   Hyperlipidemia Mother    Heart disease Mother        Atrial fibrilation   Cancer Mother        ? Melanoma   Atrial fibrillation Mother    Depression Father    Bipolar disorder Father    Diabetes Father    Mental illness Father        Bipolar   Leukemia Father 48       MDS< then leukemia    Depression Sister    Cancer Sister        Clear cell sarcoma in leg   Hypothyroidism Sister    Arrhythmia Maternal Grandfather    Hypertension Maternal Grandmother    Heart disease Maternal Grandmother        Afib   Arrhythmia Maternal Grandmother    Arthritis Paternal Grandmother    ADD / ADHD Nephew    Anxiety disorder Nephew    ADD / ADHD Son    Autism spectrum disorder Son    Colon cancer Neg Hx    Rectal cancer Neg Hx    Pancreatic cancer Neg Hx     Social History: Reviewed social history from progress note on 04/22/2022. Social History   Socioeconomic History   Marital status: Divorced    Spouse name: Not on file   Number of children: 1   Years of education: Not on file   Highest education level: Master's degree (e.g., MA, MS, MEng, MEd, MSW, MBA)  Occupational History   Occupation: Teaches preschool    Employer: [employer]  Tobacco Use   Smoking status: Never    Passive exposure: Never   Smokeless tobacco: Never  Vaping Use   Vaping Use: Never used  Substance and Sexual Activity   Alcohol use: Not Currently    Comment: occasional   Drug use: No   Sexual activity: Yes    Birth control/protection: I.U.D.  Other Topics Concern   Not on file  Social History Narrative   2 Step children; asthma, ADHD   Social Determinants of Health   Financial Resource Strain: Not on file  Food Insecurity: Not on file  Transportation Needs: Not on file  Physical Activity: Not on file  Stress: Not on file  Social Connections: Not on file    Allergies:  Allergies  Allergen Reactions    Fish Allergy Nausea And Vomiting   Augmentin [Amoxicillin-Pot Clavulanate] Diarrhea    Can take regular amoxicillin with no issues Has patient had a PCN reaction causing immediate rash, facial/tongue/throat swelling, SOB or lightheadedness with hypotension: No Has patient had a PCN reaction causing severe rash involving mucus membranes or skin necrosis: No Has patient had a PCN reaction that required hospitalization: No Has patient had a PCN reaction occurring within the last 10  years: Yes--but DIARRHEA ONLY If all of the above answers are "NO", then may proceed with Cephalosporin [place]   Citalopram Other (See Comments)    Tachycardia    Eggs Or Egg-Derived Products Diarrhea and Nausea And Vomiting   Requip [Ropinirole]     Not tolerated    Sulfonamide Derivatives Itching    Metabolic Disorder Labs: Lab Results  Component Value Date   HGBA1C 5.6 01/16/2022   Lab Results  Component Value Date   PROLACTIN 5.7 01/16/2022   PROLACTIN 7.5 09/05/2015   Lab Results  Component Value Date   CHOL 158 06/11/2021   TRIG 68.0 06/11/2021   HDL 58.30 06/11/2021   CHOLHDL 3 06/11/2021   VLDL 13.6 06/11/2021   LDLCALC 86 06/11/2021   LDLCALC 89 04/05/2019   Lab Results  Component Value Date   TSH 0.90 01/16/2022   TSH 0.95 12/10/2021    Therapeutic Level Labs: No results found for: "LITHIUM" No results found for: "VALPROATE" No results found for: "CBMZ"  Current Medications: Current Outpatient Medications  Medication Sig Dispense Refill   busPIRone (BUSPAR) 10 MG tablet TAKE 1 TABLET BY MOUTH TWICE A DAY 180 tablet 0   calcium gluconate 500 MG tablet Take 1 tablet by mouth 2 (two) times daily.      clonazePAM (KLONOPIN) 0.5 MG tablet Take 1 tablet (0.5 mg total) by mouth as directed. Take 1 tablet once a day as needed for severe panic attacks only 10 tablet 0   Efinaconazole 10 % SOLN Apply 1 drop topically daily. 4 mL 11   estradiol (VIVELLE-DOT) 0.025 MG/24HR Place 1 patch onto  the skin 2 (two) times a week.     levonorgestrel (MIRENA) 20 MCG/24HR IUD 1 Intra Uterine Device (1 each total) by Intrauterine route once. 1 each 0   Multiple Vitamins-Iron (MULTI-VITAMIN/IRON PO) Take 1 tablet by mouth daily.      omeprazole (PRILOSEC OTC) 20 MG tablet Take 20 mg by mouth daily before breakfast.      pramipexole (MIRAPEX) 0.125 MG tablet Take 1 tablet (0.125 mg total) by mouth 3 (three) times daily. 90 tablet 2   rizatriptan (MAXALT) 10 MG tablet TAKE 1 TABLET BY MOUTH AS NEEDED FOR MIGRAINE. MAY REPEAT IN 2 HOURS IF NEEDED 10 tablet 2   topiramate (TOPAMAX) 100 MG tablet TAKE 1 TABLET BY MOUTH EVERY DAY 90 tablet 1   traZODone (DESYREL) 100 MG tablet TAKE 1 TABLET (100 MG TOTAL) BY MOUTH AT BEDTIME. FOR SLEEP 90 tablet 0   Vitamin D, Ergocalciferol, (DRISDOL) 1.25 MG (50000 UNIT) CAPS capsule TAKE 1 CAPSULE BY MOUTH ONE TIME PER WEEK 12 capsule 3   FLUoxetine (PROZAC) 40 MG capsule Take 2 capsules (80 mg total) by mouth daily. 180 capsule 0   hydrOXYzine (ATARAX) 25 MG tablet Take 1-2 tablets (25-50 mg total) by mouth at bedtime as needed for anxiety. And sleep 180 tablet 0   No current facility-administered medications for this visit.     Musculoskeletal: Strength & Muscle Tone: within normal limits Gait & Station: normal Patient leans: N/A  Psychiatric Specialty Exam: Review of Systems  Psychiatric/Behavioral:  The patient is nervous/anxious.   All other systems reviewed and are negative.   Blood pressure 111/73, pulse 90, temperature 98.4 F (36.9 C), temperature source Oral, height '5\' 2"'$  (1.575 m), weight 151 lb (68.5 kg).Body mass index is 27.62 kg/m.  General Appearance: Casual  Eye Contact:  Fair  Speech:  Clear and Coherent  Volume:  Normal  Mood:  Anxious  Affect:  Congruent  Thought Process:  Goal Directed and Descriptions of Associations: Intact  Orientation:  Full (Time, Place, and Person)  Thought Content: Logical   Suicidal Thoughts:  No   Homicidal Thoughts:  No  Memory:  Immediate;   Fair Recent;   Fair Remote;   Fair  Judgement:  Fair  Insight:  Fair  Psychomotor Activity:  Normal  Concentration:  Concentration: Fair and Attention Span: Fair  Recall:  [employer] of Knowledge: Fair  Language: Fair  Akathisia:  No  Handed:  Right  AIMS (if indicated): not done  Assets:  Communication Skills Desire for Improvement Housing Social Support  ADL's:  Intact  Cognition: WNL  Sleep:  Fair   Screenings: GAD-7    Flowsheet Row Office Visit from 08/24/2022 in [facility] Office Visit from 07/30/2022 in [facility] Video Visit from 07/14/2022 in [place] [facility] Office Visit from 06/03/2022 in [facility] Visit from 04/22/2022 in [facility]  Total GAD-7 Score '17 21 12 11 12      '$ PHQ2-9    [place] Visit from 08/24/2022 in [facility] Visit from 07/30/2022 in [facility] Video Visit from 07/14/2022 in [facility] Office Visit from 06/29/2022 in [facility] Office Visit from 06/03/2022 in [place]  PHQ-2 Total Score '2 6 1 5 3  '$ PHQ-9 Total Score 5 22 -- 19 8      [place] Visit from 08/24/2022 in [facility] Office Visit from 07/30/2022 in [facility] ED from 07/29/2022 in [place] [facility] No Risk No Risk No Risk        Assessment and Plan: Selena [name] is a 56 year old Caucasian female, employed, divorced, has a history of PTSD, GAD, multiple medical problems was evaluated in office today.  Patient is currently improving, will benefit from following plan.  Plan PTSD-improving Continue  EMDR with her therapist. Prozac 80 mg p.o. daily Trazodone 100 mg p.o. nightly BuSpar 10 mg p.o. twice daily.  GAD-improving Prozac as prescribed BuSpar 10 mg p.o. twice daily Hydroxyzine 25-50 mg p.o. nightly as needed for sleep and anxiety Continue CBT  Insomnia-improving Trazodone 100 mg p.o. nightly for sleep Pramipexole for restless leg symptoms as prescribed  Panic attacks-improving Clonazepam 0.5 mg as needed for severe anxiety Reviewed [place] PMP AWARxE  Follow-up in clinic in 1 month or sooner if needed.   This note was generated in part or whole with voice recognition software. Voice recognition is usually quite accurate but there are transcription errors that can and very often do occur. I apologize for any typographical errors that were not detected and corrected.      Ursula Alert, MD 08/25/2022, 12:47 PM

## 2022-09-04 IMAGING — US US ABDOMEN COMPLETE
1 series · 14 of 25 positions shown · non-contrast
Comparison: CT abdomen pelvis 07/25/2019

CLINICAL DATA: Elevated LFTs.  Prior cholecystectomy.

EXAM:
ABDOMEN ULTRASOUND COMPLETE

[Series 1: us abdomen complete · 0.20mm/px · 14 of 128 slices shown]
[im 1/128]
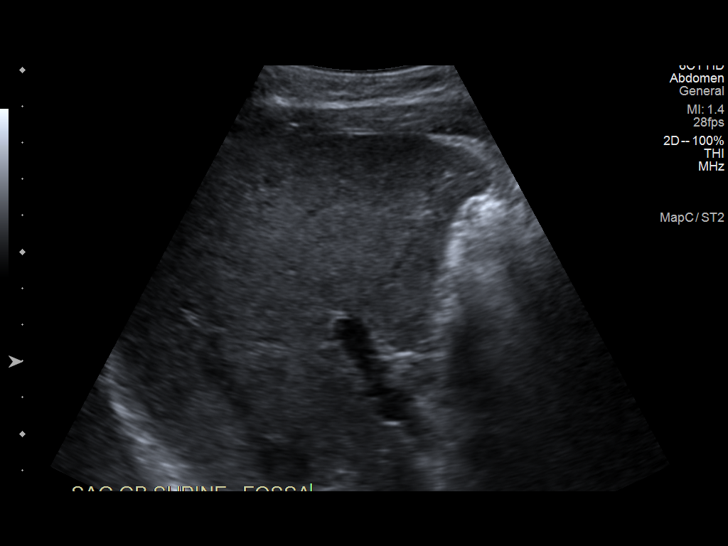
[im 11/128]
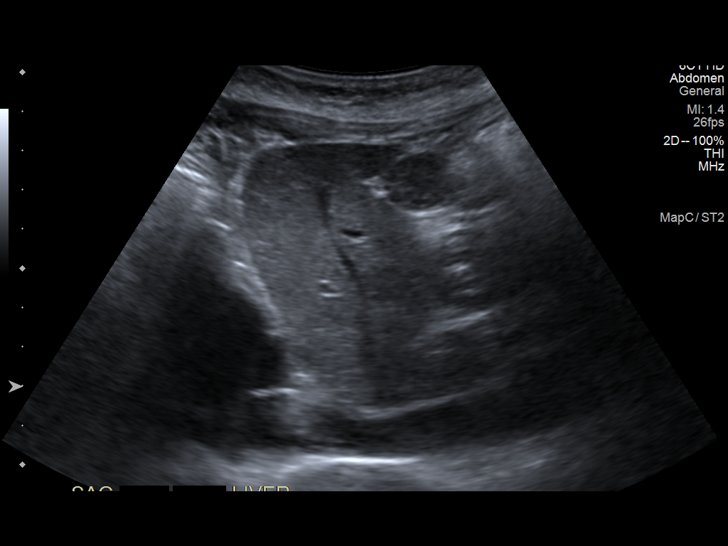
[im 22/128]
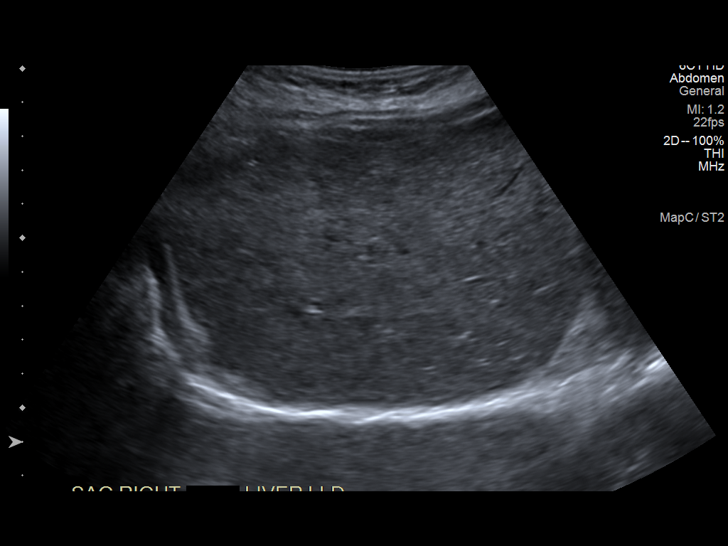
[im 32/128]
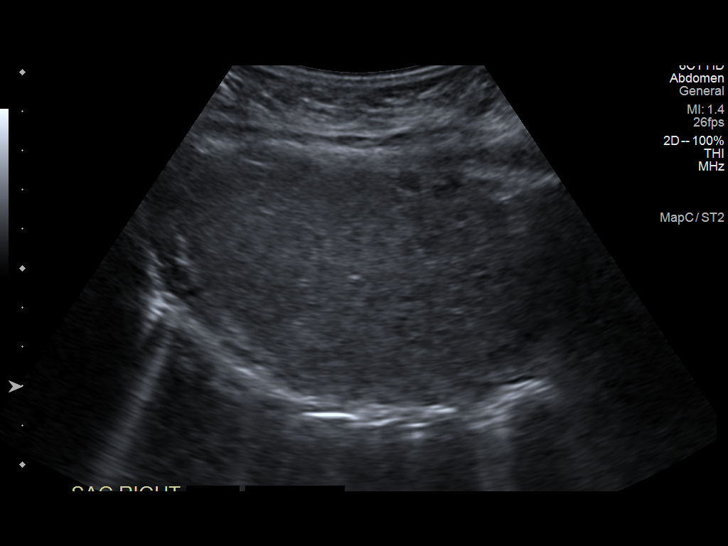
[im 43/128]
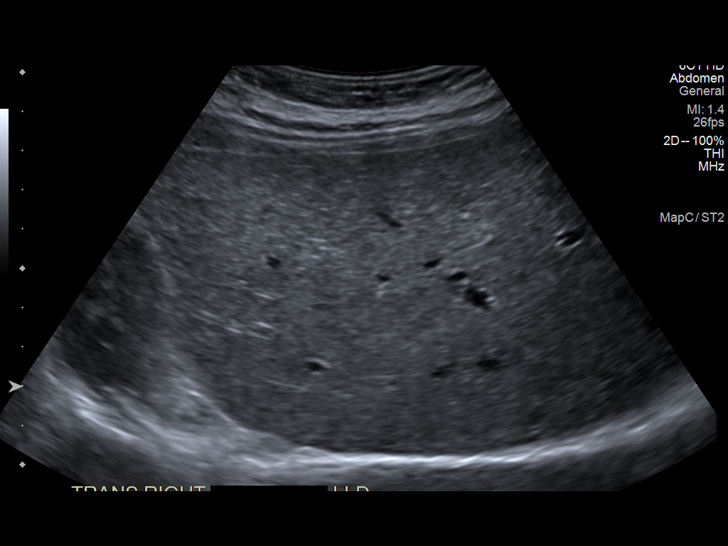
[im 48/128]
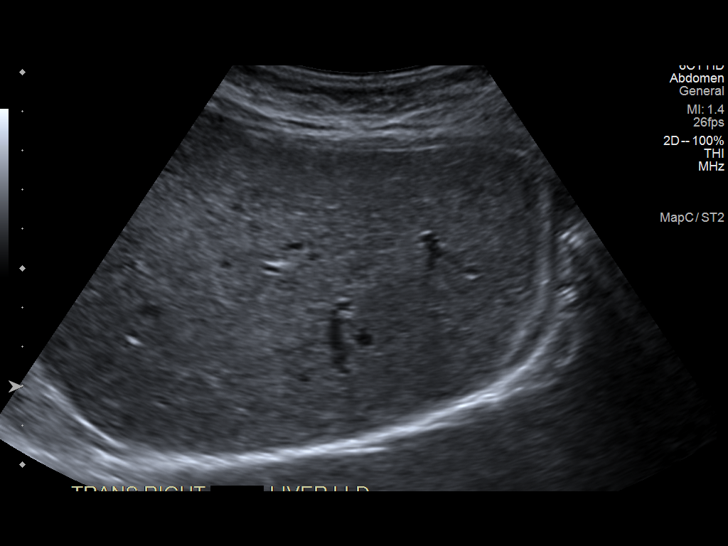
[im 59/128]
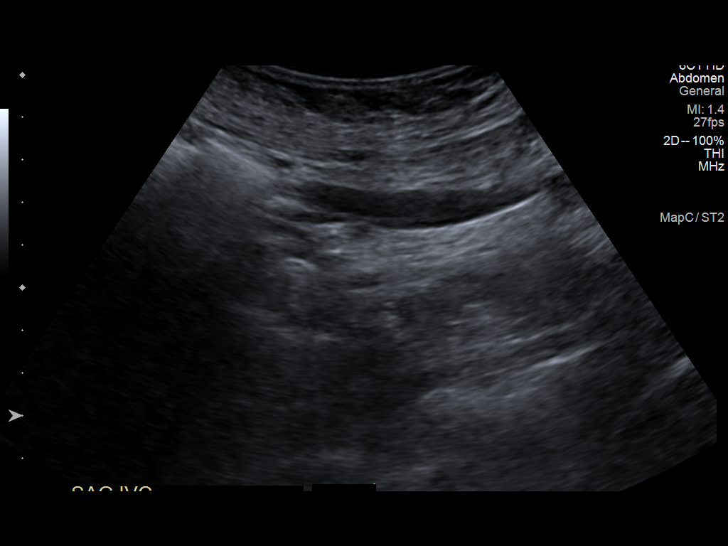
[im 69/128]
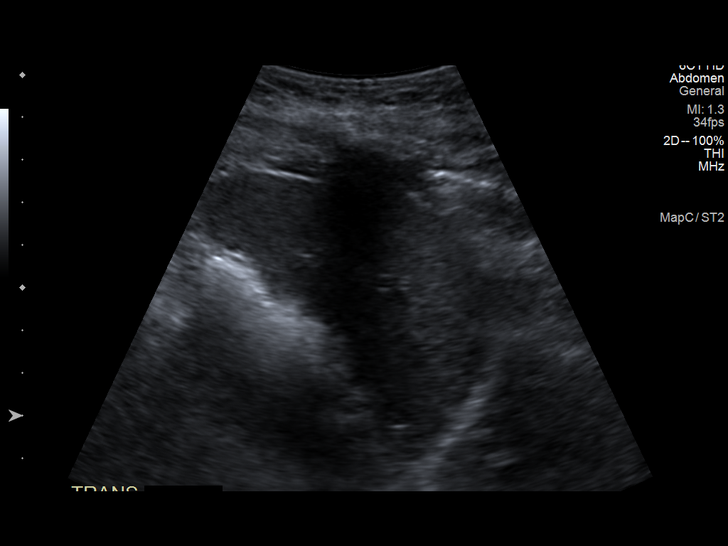
[im 80/128]
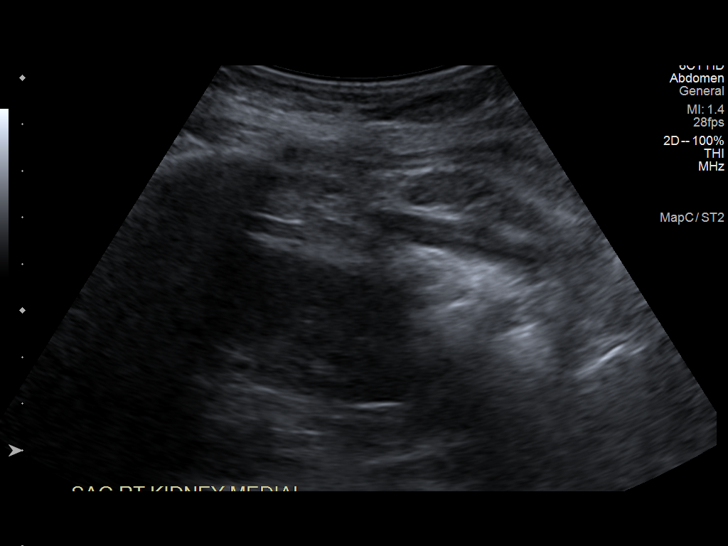
[im 85/128]
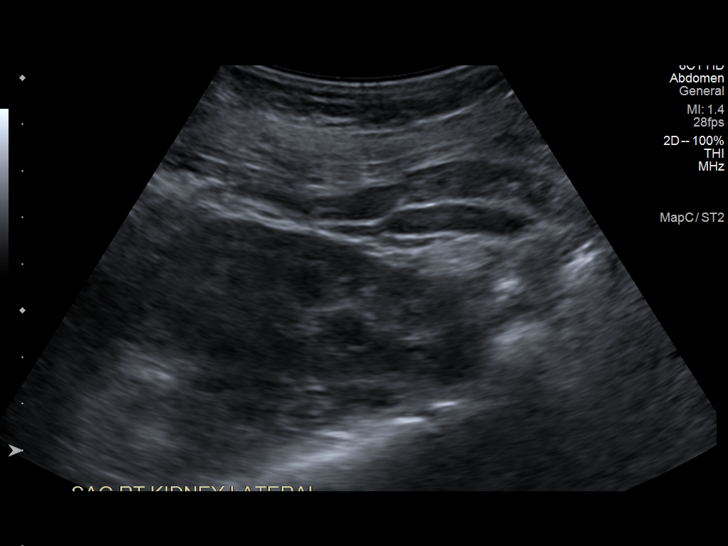
[im 96/128]
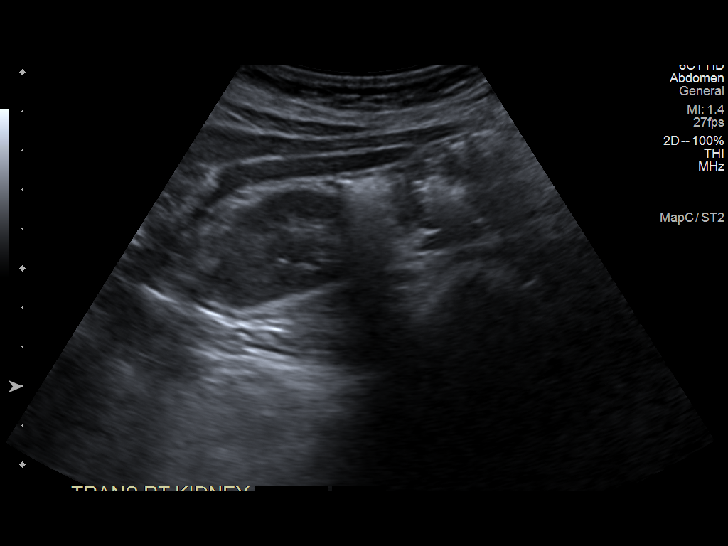
[im 106/128]
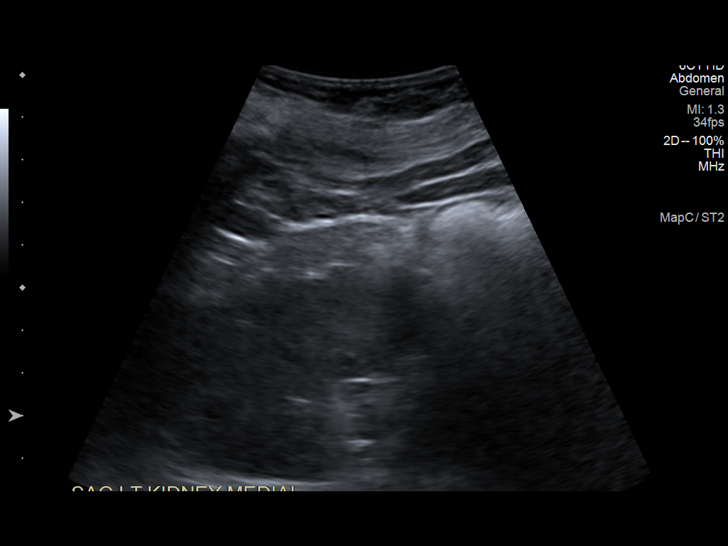
[im 117/128]
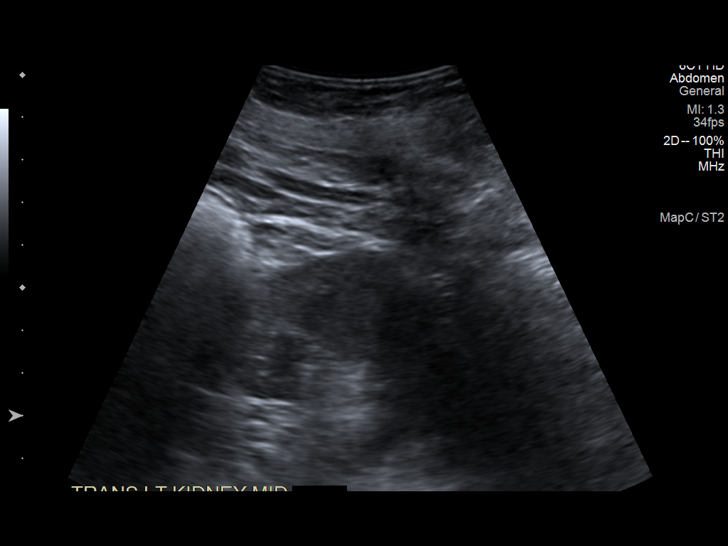
[im 128/128]
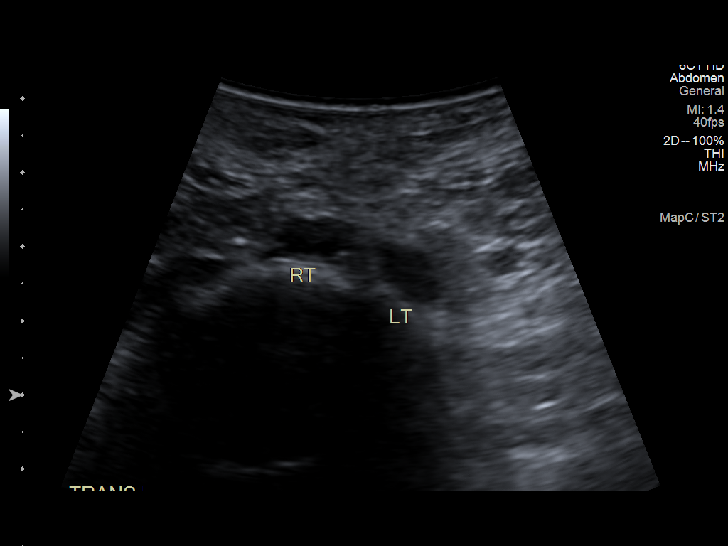

[14 of 25 positions shown; findings below may reference images not displayed]

FINDINGS: Gallbladder: Surgically absent

Common bile duct: Diameter: 7 mm

Liver: Mildly increased in echogenicity. Portal vein is patent on
color Doppler imaging with normal direction of blood flow towards
the liver.

IVC: No abnormality visualized.

Pancreas: Not visualized.

Spleen: Size and appearance within normal limits.

Right Kidney: Length: 8.9 cm. Echogenicity within normal limits. No
mass or hydronephrosis visualized.

Left Kidney: Length: 9.4 cm. Echogenicity within normal limits. No
mass or hydronephrosis visualized.

Abdominal aorta: Proximal and mid aorta not well visualized or
evaluated.

Other findings: Known hepatic dome lesion not visualized due to
overlying bowel gas.
IMPRESSION: Increased hepatic parenchymal echogenicity suggestive of steatosis.

Surgically absent gallbladder.

No acute process.

## 2022-09-07 ENCOUNTER — Telehealth: Payer: Self-pay | Admitting: Psychiatry

## 2022-09-07 ENCOUNTER — Encounter: Payer: Self-pay | Admitting: Psychiatry

## 2022-09-07 NOTE — Telephone Encounter (Signed)
Fax to [phone]. Sorry , it looks like there is a fax number , please verify fax number and fax it please and let patient know. Thank you

## 2022-09-07 NOTE — Telephone Encounter (Signed)
Noted , letter given to [facility].

## 2022-09-07 NOTE — Telephone Encounter (Signed)
I have printed out a letter for patient, please contact patient to let her know.

## 2022-09-07 NOTE — Telephone Encounter (Signed)
Patient left message asking if you can provide a return to work note to [facility]. Stating she could return to work on 08-27-26 with no restrictions and fax to [phone].

## 2022-09-09 ENCOUNTER — Encounter: Payer: Self-pay | Admitting: Internal Medicine

## 2022-09-11 ENCOUNTER — Other Ambulatory Visit: Payer: Self-pay | Admitting: Internal Medicine

## 2022-09-11 DIAGNOSIS — R635 Abnormal weight gain: Secondary | ICD-10-CM

## 2022-09-11 MED ORDER — SAXENDA 18 MG/3ML ~~LOC~~ SOPN: 0.6000 mg | PEN_INJECTOR | Freq: Every day | SUBCUTANEOUS | 0 refills | Status: DC

## 2022-09-11 MED ORDER — PEN NEEDLES 31G X 6 MM MISC: 0 refills | Status: DC

## 2022-09-11 NOTE — Assessment & Plan Note (Signed)
Patient has requested Saxenda restart for progressive weight gain.  Starting weight 150 lbs

## 2022-09-16 NOTE — Telephone Encounter (Signed)
MyChart messgae sent to patient. 

## 2022-09-26 ENCOUNTER — Telehealth: Payer: BC Managed Care – PPO | Admitting: Physician Assistant

## 2022-09-26 DIAGNOSIS — U071 COVID-19: Secondary | ICD-10-CM | POA: Diagnosis not present

## 2022-09-26 MED ORDER — NIRMATRELVIR/RITONAVIR (PAXLOVID)TABLET: 3.0000 | ORAL_TABLET | Freq: Two times a day (BID) | ORAL | 0 refills | Status: AC
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
## 2022-09-26 NOTE — Patient Instructions (Signed)
[name], thank you for joining Mar Daring, PA-C for today's virtual visit.  While this provider is not your primary care provider (PCP), if your PCP is located in our provider database this encounter information will be shared with them immediately following your visit.   [facility] account gives you access to today's visit and all your visits, tests, and labs performed at [facility] " click here if you don't have a [facility] account or go to [email or web address]  Consent: (Patient) Selena [name] provided verbal consent for this virtual visit at the beginning of the encounter.  Current Medications:  Current Outpatient Medications:    nirmatrelvir/ritonavir (PAXLOVID) 20 x 150 MG & 10 x '100MG'$  TABS, Take 3 tablets by mouth 2 (two) times daily for 5 days. (Take nirmatrelvir 150 mg two tablets twice daily for 5 days and ritonavir 100 mg one tablet twice daily for 5 days) Patient GFR is greater than 60, Disp: 30 tablet, Rfl: 0   busPIRone (BUSPAR) 10 MG tablet, TAKE 1 TABLET BY MOUTH TWICE A DAY, Disp: 180 tablet, Rfl: 0   calcium gluconate 500 MG tablet, Take 1 tablet by mouth 2 (two) times daily. , Disp: , Rfl:    clonazePAM (KLONOPIN) 0.5 MG tablet, Take 1 tablet (0.5 mg total) by mouth as directed. Take 1 tablet once a day as needed for severe panic attacks only, Disp: 10 tablet, Rfl: 0   Efinaconazole 10 % SOLN, Apply 1 drop topically daily., Disp: 4 mL, Rfl: 11   estradiol (VIVELLE-DOT) 0.025 MG/24HR, Place 1 patch onto the skin 2 (two) times a week., Disp: , Rfl:    FLUoxetine (PROZAC) 40 MG capsule, Take 2 capsules (80 mg total) by mouth daily., Disp: 180 capsule, Rfl: 0   hydrOXYzine (ATARAX) 25 MG tablet, Take 1-2 tablets (25-50 mg total) by mouth at bedtime as needed for anxiety. And sleep, Disp: 180 tablet, Rfl: 0   Insulin Pen Needle (PEN NEEDLES) 31G X 6 MM MISC, For use with victoza /saxenda, Disp: 100 each, Rfl: 0    levonorgestrel (MIRENA) 20 MCG/24HR IUD, 1 Intra Uterine Device (1 each total) by Intrauterine route once., Disp: 1 each, Rfl: 0   Liraglutide -Weight Management (SAXENDA) 18 MG/3ML SOPN, Inject 0.6 mg into the skin daily. Increase dose weekly as follows: Week 2: 1.2 mg daily ; Week 3: 1.8 mg daily; Week 4: 2.4 mg daily, Disp: 9 mL, Rfl: 0   Multiple Vitamins-Iron (MULTI-VITAMIN/IRON PO), Take 1 tablet by mouth daily. , Disp: , Rfl:    omeprazole (PRILOSEC OTC) 20 MG tablet, Take 20 mg by mouth daily before breakfast. , Disp: , Rfl:    pramipexole (MIRAPEX) 0.125 MG tablet, Take 1 tablet (0.125 mg total) by mouth 3 (three) times daily., Disp: 90 tablet, Rfl: 2   rizatriptan (MAXALT) 10 MG tablet, TAKE 1 TABLET BY MOUTH AS NEEDED FOR MIGRAINE. MAY REPEAT IN 2 HOURS IF NEEDED, Disp: 10 tablet, Rfl: 2   topiramate (TOPAMAX) 100 MG tablet, TAKE 1 TABLET BY MOUTH EVERY DAY, Disp: 90 tablet, Rfl: 1   traZODone (DESYREL) 100 MG tablet, TAKE 1 TABLET (100 MG TOTAL) BY MOUTH AT BEDTIME. FOR SLEEP, Disp: 90 tablet, Rfl: 0   Vitamin D, Ergocalciferol, (DRISDOL) 1.25 MG (50000 UNIT) CAPS capsule, TAKE 1 CAPSULE BY MOUTH ONE TIME PER WEEK, Disp: 12 capsule, Rfl: 3   Medications ordered in this encounter:  Meds ordered this encounter  Medications   nirmatrelvir/ritonavir (PAXLOVID) 20 x 150  MG & 10 x '100MG'$  TABS    Sig: Take 3 tablets by mouth 2 (two) times daily for 5 days. (Take nirmatrelvir 150 mg two tablets twice daily for 5 days and ritonavir 100 mg one tablet twice daily for 5 days) Patient GFR is greater than 60    Dispense:  30 tablet    Refill:  0    Order Specific Question:   Supervising Provider    Answer:   Chase Picket [id number]     *If you need refills on other medications prior to your next appointment, please contact your pharmacy*  Follow-Up: Call back or seek an in-person evaluation if the symptoms worsen or if the condition fails to improve as anticipated.  [facility] [phone]  Other Instructions  Can take to lessen severity: Vit C '500mg'$  twice daily Quercertin 250-'500mg'$  twice daily Zinc 75-'100mg'$  daily Melatonin 3-6 mg at bedtime Vit D3 1000-2000 IU daily Aspirin 81 mg daily with food Optional: Famotidine '20mg'$  daily Also can add tylenol/ibuprofen as needed for fevers and body aches May add Mucinex or Mucinex DM as needed for cough/congestion   COVID-19 COVID-19, or coronavirus disease 2019, is an infection that is caused by a new (novel) coronavirus called SARS-CoV-2. COVID-19 can cause many symptoms. In some people, the virus may not cause any symptoms. In others, it may cause mild or severe symptoms. Some people with severe infection develop severe disease. What are the causes? This illness is caused by a virus. The virus may be in the air as tiny specks of fluid (aerosols) or droplets, or it may be on surfaces. You may catch the virus by: Breathing in droplets from an infected person. Droplets can be spread by a person breathing, speaking, singing, coughing, or sneezing. Touching something, like a table or a doorknob, that has virus on it (is contaminated) and then touching your mouth, nose, or eyes. What increases the risk? Risk for infection: You are more likely to get infected with the COVID-19 virus if: You are within 6 ft (1.8 m) of a person with COVID-19 for 15 minutes or longer. You are providing care for a person who is infected with COVID-19. You are in close personal contact with other people. Close personal contact includes hugging, kissing, or sharing eating or drinking utensils. Risk for serious illness caused by COVID-19: You are more likely to get seriously ill from the COVID-19 virus if: You have cancer. You have a long-term (chronic) disease, such as: Chronic lung disease. This includes pulmonary embolism, chronic obstructive pulmonary disease, and cystic fibrosis. Long-term disease that lowers your body's ability to  fight infection (immunocompromise). Serious cardiac conditions, such as heart failure, coronary artery disease, or cardiomyopathy. Diabetes. Chronic kidney disease. Liver diseases. These include cirrhosis, nonalcoholic fatty liver disease, alcoholic liver disease, or autoimmune hepatitis. You have obesity. You are pregnant or were recently pregnant. You have sickle cell disease. What are the signs or symptoms? Symptoms of this condition can range from mild to severe. Symptoms may appear any time from 2 to 14 days after being exposed to the virus. They include: Fever or chills. Shortness of breath or trouble breathing. Feeling tired or very tired. Headaches, body aches, or muscle aches. Runny or stuffy nose, sneezing, coughing, or sore throat. New loss of taste or smell. This is rare. Some people may also have stomach problems, such as nausea, vomiting, or diarrhea. Other people may not have any symptoms of COVID-19. How is this diagnosed? This  condition may be diagnosed by testing samples to check for the COVID-19 virus. The most common tests are the PCR test and the antigen test. Tests may be done in the lab or at home. They include: Using a swab to take a sample of fluid from the back of your nose and throat (nasopharyngeal fluid), from your nose, or from your throat. Testing a sample of saliva from your mouth. Testing a sample of coughed-up mucus from your lungs (sputum). How is this treated? Treatment for COVID-19 infection depends on the severity of the condition. Mild symptoms can be managed at home with rest, fluids, and over-the-counter medicines. Serious symptoms may be treated in a hospital intensive care unit (ICU). Treatment in the ICU may include: Supplemental oxygen. Extra oxygen is given through a tube in the nose, a face mask, or a hood. Medicines. These may include: Antivirals, such as monoclonal antibodies. These help your body fight off certain viruses that can cause  disease. Anti-inflammatories, such as corticosteroids. These reduce inflammation and suppress the immune system. Antithrombotics. These prevent or treat blood clots, if they develop. Convalescent plasma. This helps boost your immune system, if you have an underlying immunosuppressive condition or are getting immunosuppressive treatments. Prone positioning. This means you will lie on your stomach. This helps oxygen to get into your lungs. Infection control measures. If you are at risk for more serious illness caused by COVID-19, your health care provider may prescribe two long-acting monoclonal antibodies, given together every 6 months. How is this prevented? To protect yourself: Use preventive medicine (pre-exposure prophylaxis). You may get pre-exposure prophylaxis if you have moderate or severe immunocompromise. Get vaccinated. Anyone 53 months old or older who meets guidelines can get a COVID-19 vaccine or vaccine series. This includes people who are pregnant or making breast milk (lactating). Get an added dose of COVID-19 vaccine after your first vaccine or vaccine series if you have moderate to severe immunocompromise. This applies if you have had a solid organ transplant or have been diagnosed with an immunocompromising condition. You should get the added dose 4 weeks after you got the first COVID-19 vaccine or vaccine series. If you get an mRNA vaccine, you will need a 3-dose primary series. If you get the J&J/Janssen vaccine, you will need a 2-dose primary series, with the second dose being an mRNA vaccine. Talk to your health care provider about getting experimental monoclonal antibodies. This treatment is approved under emergency use authorization to prevent severe illness before or after being exposed to the COVID-19 virus. You may be given monoclonal antibodies if: You have moderate or severe immunocompromise. This includes treatments that lower your immune response. People with  immunocompromise may not develop protection against COVID-19 when they are vaccinated. You cannot be vaccinated. You may not get a vaccine if you have a severe allergic reaction to the vaccine or its components. You are not fully vaccinated. You are in a facility where COVID-19 is present and: Are in close contact with a person who is infected with the COVID-19 virus. Are at high risk of being exposed to the COVID-19 virus. You are at risk of illness from new variants of the COVID-19 virus. To protect others: If you have symptoms of COVID-19, take steps to prevent the virus from spreading to others. Stay home. Leave your house only to get medical care. Do not use public transit, if possible. Do not travel while you are sick. Wash your hands often with soap and water for at least 20  seconds. If soap and water are not available, use alcohol-based hand sanitizer. Make sure that all people in your household wash their hands well and often. Cough or sneeze into a tissue or your sleeve or elbow. Do not cough or sneeze into your hand or into the air. Where to find more information Centers for Disease Control and Prevention: CharmCourses.be World Health Organization: https://www.castaneda.info/ Get help right away if: You have trouble breathing. You have pain or pressure in your chest. You are confused. You have bluish lips and fingernails. You have trouble waking from sleep. You have symptoms that get worse. These symptoms may be an emergency. Get help right away. Call 911. Do not wait to see if the symptoms will go away. Do not drive yourself to the hospital. Summary COVID-19 is an infection that is caused by a new coronavirus. Sometimes, there are no symptoms. Other times, symptoms range from mild to severe. Some people with a severe COVID-19 infection develop severe disease. The virus that causes COVID-19 can spread from person to person through droplets or aerosols from  breathing, speaking, singing, coughing, or sneezing. Mild symptoms of COVID-19 can be managed at home with rest, fluids, and over-the-counter medicines. This information is not intended to replace advice given to you by your health care provider. Make sure you discuss any questions you have with your health care provider. Document Revised: 09/09/2021 Document Reviewed: 09/11/2021 Elsevier Patient Education  [place].    If you have been instructed to have an in-person evaluation today at a local Urgent Care facility, please use the link below. It will take you to a list of all of our available [facility], including address, phone number and hours of operation. Please do not delay care.  [facility]  If you or a family member do not have a primary care provider, use the link below to schedule a visit and establish care. When you choose a [facility] primary care physician or advanced practice provider, you gain a long-term partner in health. Find a Primary Care Provider  Learn more about [facility] in-office and virtual care options: [facility]

## 2022-10-01 ENCOUNTER — Encounter: Payer: Self-pay | Admitting: Internal Medicine

## 2022-10-19 ENCOUNTER — Ambulatory Visit: Payer: BC Managed Care – PPO | Admitting: Psychiatry

## 2022-10-21 ENCOUNTER — Encounter: Payer: Self-pay | Admitting: Family Medicine

## 2022-10-21 ENCOUNTER — Ambulatory Visit: Payer: BC Managed Care – PPO | Admitting: Family Medicine

## 2022-10-21 ENCOUNTER — Other Ambulatory Visit: Payer: Self-pay | Admitting: Internal Medicine

## 2022-10-21 ENCOUNTER — Other Ambulatory Visit: Payer: Self-pay | Admitting: Psychiatry

## 2022-10-21 VITALS — BP 120/80 | HR 72 | Temp 98.7°F | Ht 62.0 in | Wt 150.6 lb

## 2022-10-21 DIAGNOSIS — F410 Panic disorder [episodic paroxysmal anxiety] without agoraphobia: Secondary | ICD-10-CM

## 2022-10-21 DIAGNOSIS — J989 Respiratory disorder, unspecified: Secondary | ICD-10-CM

## 2022-10-21 DIAGNOSIS — F411 Generalized anxiety disorder: Secondary | ICD-10-CM

## 2022-10-21 DIAGNOSIS — F4310 Post-traumatic stress disorder, unspecified: Secondary | ICD-10-CM

## 2022-10-21 MED ORDER — HYDROCOD POLI-CHLORPHE POLI ER 10-8 MG/5ML PO SUER: 5.0000 mL | Freq: Every evening | ORAL | 0 refills | Status: DC | PRN

## 2022-10-21 MED ORDER — ALBUTEROL SULFATE HFA 108 (90 BASE) MCG/ACT IN AERS: 2.0000 | INHALATION_SPRAY | Freq: Four times a day (QID) | RESPIRATORY_TRACT | 0 refills | Status: DC | PRN

## 2022-10-21 NOTE — Assessment & Plan Note (Addendum)
Discussed that the patient most likely has a viral respiratory illness.  This is unlikely to be COVID related given her recent COVID infection.  Discussed treatment with cough suppressant at night and an albuterol inhaler given her coarseness.  Discussed using the albuterol inhaler if she has excessive cough or wheezing.  If not improving within the 7 to 10-day timeframe she will let us know and an antibiotic could be considered at that time.  If she develops shortness of breath, cough productive of blood, or fever she will seek medical attention.  Message sent to patient through MyChart advising of risk of drowsiness with the Tussionex.  Advised not to take her hydroxyzine or clonazepam at the same time as her Tussionex.

## 2022-10-21 NOTE — Patient Instructions (Addendum)
Nice to see you. You likely have a viral illness. We are going [facility] he is here for an A1c to treat your cough with Tussionex.  If this makes you excessively drowsy please discontinue use of this.  Please try to only take this at night to help with your cough.  Please do not drive if you get drowsy with this.  Please do not take your hydroxyzine or clonazepam with the Tussionex.

## 2022-10-21 NOTE — Progress Notes (Signed)
Selena Rumps, MD Phone: [phone]  Selena [name] is a 57 y.o. female who presents today for same-day visit.  Since  Respiratory illness: Patient notes she had COVID the week of Christmas.  She fully recovered from that and then over the last couple of days has developed some cough and sinus congestion with postnasal drip.  She has had some nausea related to the drainage.  No fever or shortness of breath.  She did a test for COVID that was negative.  She is been taking over-the-counter medications and cough drops.  She does work at a school.  Social History   Tobacco Use  Smoking Status Never   Passive exposure: Never  Smokeless Tobacco Never    Current Outpatient Medications on File Prior to Visit  Medication Sig Dispense Refill   busPIRone (BUSPAR) 10 MG tablet TAKE 1 TABLET BY MOUTH TWICE A DAY 180 tablet 0   calcium gluconate 500 MG tablet Take 1 tablet by mouth 2 (two) times daily.      Efinaconazole 10 % SOLN Apply 1 drop topically daily. 4 mL 11   estradiol (VIVELLE-DOT) 0.025 MG/24HR Place 1 patch onto the skin 2 (two) times a week.     FLUoxetine (PROZAC) 40 MG capsule Take 2 capsules (80 mg total) by mouth daily. 180 capsule 0   hydrOXYzine (ATARAX) 25 MG tablet Take 1-2 tablets (25-50 mg total) by mouth at bedtime as needed for anxiety. And sleep 180 tablet 0   Insulin Pen Needle (PEN NEEDLES) 31G X 6 MM MISC For use with victoza /saxenda 100 each 0   levonorgestrel (MIRENA) 20 MCG/24HR IUD 1 Intra Uterine Device (1 each total) by Intrauterine route once. 1 each 0   Liraglutide -Weight Management (SAXENDA) 18 MG/3ML SOPN Inject 0.6 mg into the skin daily. Increase dose weekly as follows: Week 2: 1.2 mg daily ; Week 3: 1.8 mg daily; Week 4: 2.4 mg daily 9 mL 0   Multiple Vitamins-Iron (MULTI-VITAMIN/IRON PO) Take 1 tablet by mouth daily.      omeprazole (PRILOSEC OTC) 20 MG tablet Take 20 mg by mouth daily before breakfast.      pramipexole (MIRAPEX) 0.125 MG tablet  Take 1 tablet (0.125 mg total) by mouth 3 (three) times daily. 90 tablet 2   rizatriptan (MAXALT) 10 MG tablet TAKE 1 TABLET BY MOUTH AS NEEDED FOR MIGRAINE. MAY REPEAT IN 2 HOURS IF NEEDED 10 tablet 2   topiramate (TOPAMAX) 100 MG tablet TAKE 1 TABLET BY MOUTH EVERY DAY 90 tablet 1   traZODone (DESYREL) 100 MG tablet TAKE 1 TABLET (100 MG TOTAL) BY MOUTH AT BEDTIME. FOR SLEEP 90 tablet 0   Vitamin D, Ergocalciferol, (DRISDOL) 1.25 MG (50000 UNIT) CAPS capsule TAKE 1 CAPSULE BY MOUTH ONE TIME PER WEEK 12 capsule 3   clonazePAM (KLONOPIN) 0.5 MG tablet Take 1 tablet (0.5 mg total) by mouth as directed. Take 1 tablet once a day as needed for severe panic attacks only 10 tablet 0   No current facility-administered medications on file prior to visit.     ROS see history of present illness  Objective  Physical Exam Vitals:   10/21/22 1028  BP: 120/80  Pulse: 72  Temp: 98.7 F (37.1 C)  SpO2: 98%    BP Readings from Last 3 Encounters:  10/21/22 120/80  07/29/22 110/84  06/29/22 110/72   Wt Readings from Last 3 Encounters:  10/21/22 150 lb 9.6 oz (68.3 kg)  07/29/22 150 lb (68 kg)  06/29/22  147 lb (66.7 kg)    Physical Exam Constitutional:      General: She is not in acute distress.    Appearance: She is not diaphoretic.  HENT:     Right Ear: Tympanic membrane normal.     Left Ear: Tympanic membrane normal.  Cardiovascular:     Rate and Rhythm: Normal rate and regular rhythm.     Heart sounds: Normal heart sounds.  Pulmonary:     Effort: Pulmonary effort is normal.     Comments: Coarse breath sounds throughout, no crackles or wheezing Lymphadenopathy:     Cervical: No cervical adenopathy.  Skin:    General: Skin is warm and dry.  Neurological:     Mental Status: She is alert.      Assessment/Plan: Please see individual problem list.  Respiratory illness Assessment & Plan: Discussed that the patient most likely has a viral respiratory illness.  This is unlikely  to be COVID related given her recent COVID infection.  Discussed treatment with cough suppressant at night and an albuterol inhaler given her coarseness.  Discussed using the albuterol inhaler if she has excessive cough or wheezing.  If not improving within the 7 to 10-day timeframe she will let us know and an antibiotic could be considered at that time.  If she develops shortness of breath, cough productive of blood, or fever she will seek medical attention.  Message sent to patient through MyChart advising of risk of drowsiness with the Tussionex.  Advised not to take her hydroxyzine or clonazepam at the same time as her Tussionex.  Orders: -     Hydrocod Poli-Chlorphe Poli ER; Take 5 mLs by mouth at bedtime as needed for cough.  Dispense: 115 mL; Refill: 0 -     Albuterol Sulfate HFA; Inhale 2 puffs into the lungs every 6 (six) hours as needed for wheezing or shortness of breath.  Dispense: 8 g; Refill: 0   Return if symptoms worsen or fail to improve.   Selena Rumps, MD [place]

## 2022-11-02 ENCOUNTER — Ambulatory Visit: Payer: BC Managed Care – PPO | Admitting: Psychiatry

## 2022-11-12 ENCOUNTER — Other Ambulatory Visit: Payer: Self-pay | Admitting: Family Medicine

## 2022-11-12 DIAGNOSIS — J989 Respiratory disorder, unspecified: Secondary | ICD-10-CM

## 2022-11-16 ENCOUNTER — Ambulatory Visit: Payer: BC Managed Care – PPO | Admitting: Psychiatry

## 2022-11-16 ENCOUNTER — Encounter: Payer: Self-pay | Admitting: Psychiatry

## 2022-11-16 VITALS — BP 107/68 | HR 79 | Temp 98.1°F | Ht 62.0 in | Wt 149.0 lb

## 2022-11-16 DIAGNOSIS — G4701 Insomnia due to medical condition: Secondary | ICD-10-CM

## 2022-11-16 DIAGNOSIS — F410 Panic disorder [episodic paroxysmal anxiety] without agoraphobia: Secondary | ICD-10-CM

## 2022-11-16 DIAGNOSIS — F4310 Post-traumatic stress disorder, unspecified: Secondary | ICD-10-CM | POA: Diagnosis not present

## 2022-11-16 DIAGNOSIS — F411 Generalized anxiety disorder: Secondary | ICD-10-CM | POA: Diagnosis not present

## 2022-11-16 MED ORDER — FLUOXETINE HCL 40 MG PO CAPS: 80.0000 mg | ORAL_CAPSULE | Freq: Every day | ORAL | 0 refills | Status: DC

## 2022-11-16 MED ORDER — BUSPIRONE HCL 10 MG PO TABS: 10.0000 mg | ORAL_TABLET | Freq: Every day | ORAL | 0 refills | Status: DC

## 2022-11-16 MED ORDER — TRAZODONE HCL 50 MG PO TABS: 25.0000 mg | ORAL_TABLET | Freq: Every evening | ORAL | 0 refills | Status: DC | PRN

## 2022-11-16 NOTE — Progress Notes (Unsigned)
[facility] MD OP Progress Note  11/16/2022 4:20 PM Selena [name]  MRN:  [id number]  Chief Complaint:  Chief Complaint  Patient presents with   Follow-up   Medication Refill   Anxiety   Depression   HPI: Selena [name] is a 57 year old Caucasian female, employed, divorced, currently lives in [place], has a history of PTSD, GAD, insomnia, history of gastric bypass surgery in 2017, history of pituitary adenoma, migraine headaches, gastroesophageal reflux disease, history of abnormal LFT was evaluated in office today.  Patient today reports she is currently doing better.  Reports work is better.  She currently has a new Control and instrumentation engineer who is really good and that has helped a lot.  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  She reports she is waiting for a position to open up.  She has also applied to other job options and is currently waiting.  Overall she is currently doing fairly well on the Prozac.  Denies side effects.  She is only taking 1 dosage of BuSpar 10 mg and start BuSpar 10 mg twice a day as prescribed.  She wants to stay on the BuSpar 10 mg once a day for now.  She reports sleep is overall good.  The trazodone 100 mg, half tablet makes her groggy the next day.  Agreeable to dosage reduction.  Denies any suicidality, homicidality or perceptual disturbances.  Reports good support system from her boyfriend.  Reports she completed CBT and she and her therapist decided she can use therapy as needed at this point.  Patient denies any other concerns today.  Visit Diagnosis:    ICD-10-CM   1. PTSD (post-traumatic stress disorder)  F43.10 FLUoxetine (PROZAC) 40 MG capsule    busPIRone (BUSPAR) 10 MG tablet    2. GAD (generalized anxiety disorder)  F41.1 busPIRone (BUSPAR) 10 MG tablet    3. Insomnia due to medical condition  G47.01 traZODone (DESYREL) 50 MG tablet   Anxiety    4. Panic attack  F41.0  busPIRone (BUSPAR) 10 MG tablet      Past Psychiatric History: Reviewed past psychiatric history from progress note on 04/22/2022.  Past trials of Wellbutrin, Celexa, nortriptyline, Paxil, Effexor, Klonopin, Requip, trazodone.  Past Medical History:  Past Medical History:  Diagnosis Date   Anemia, iron deficiency    Anxiety    Asthma    Colon polyps [id number]   Complication of anesthesia    per patient report , woke during endoscopy in 2014 ; no issues with subsequent colonscopy also  in 2014    Costochondritis    srturggled wiht it since age 65 ; exacerbates with excessive movement of left arm; did phsycial therapy in 08-2017 and hasnt had issues with it since    Depression    Eating disorder    Binge eating   Fibromyalgia    Gallstones    GERD (gastroesophageal reflux disease)    Heart murmur    at birth; resolved    Hyperlipidemia    Hypertension    was due to birth control  , " as soon as they took it out, it went away  "     Past Surgical History:  Procedure Laterality Date   CHOLECYSTECTOMY     COLONOSCOPY     [facility] dr Deatra Ina    COLONOSCOPY WITH PROPOFOL N/A 08/01/2021   Procedure: COLONOSCOPY WITH PROPOFOL;  Surgeon: Virgel Manifold, MD;  Location: [facility] ENDOSCOPY;  Service: Endoscopy;  Laterality: N/A;   EVALUATION UNDER ANESTHESIA WITH HEMORRHOIDECTOMY N/A 06/24/2018   Procedure: EXAM UNDER ANESTHESIA WITH EXTERNAL HEMORRHOIDECTOMY;  Surgeon: Johnathan Hausen, MD;  Location: [facility];  Service: General;  Laterality: N/A;   EXPLORATORY LAPAROTOMY  2000   for infertility   GANGLION CYST EXCISION     GASTRIC ROUX-EN-Y N/A 01/24/2018   Procedure: LAPAROSCOPIC ROUX-EN-Y GASTRIC BYPASS WITH UPPER ENDOSCOPY;  Surgeon: Johnathan Hausen, MD;  Location: [facility];  Service: General;  Laterality: N/A;   SPHINCTEROTOMY N/A 06/24/2018   Procedure: LATERAL INTERNAL SPHINCTEROTOMY;  Surgeon: Johnathan Hausen, MD;  Location: [facility];  Service: General;  Laterality: N/A;    TONSILLECTOMY  1989    Family Psychiatric History: Reviewed family psychiatric history from progress note on 04/22/2022.  Family History:  Family History  Problem Relation Age of Onset   Hyperlipidemia Mother    Heart disease Mother        Atrial fibrilation   Cancer Mother        ? Melanoma   Atrial fibrillation Mother    Depression Father    Bipolar disorder Father    Diabetes Father    Mental illness Father        Bipolar   Leukemia Father 47       MDS< then leukemia    Depression Sister    Cancer Sister        Clear cell sarcoma in leg   Hypothyroidism Sister    Arrhythmia Maternal Grandfather    Hypertension Maternal Grandmother    Heart disease Maternal Grandmother        Afib   Arrhythmia Maternal Grandmother    Arthritis Paternal Grandmother    ADD / ADHD Nephew    Anxiety disorder Nephew    ADD / ADHD Son    Autism spectrum disorder Son    Colon cancer Neg Hx    Rectal cancer Neg Hx    Pancreatic cancer Neg Hx     Social History: Reviewed social history from progress note on 04/22/2022. Social History   Socioeconomic History   Marital status: Divorced    Spouse name: Not on file   Number of children: 1   Years of education: Not on file   Highest education level: Master's degree (e.g., MA, MS, MEng, MEd, MSW, MBA)  Occupational History   Occupation: Teaches preschool    Employer: [employer]  Tobacco Use   Smoking status: Never    Passive exposure: Never   Smokeless tobacco: Never  Vaping Use   Vaping Use: Never used  Substance and Sexual Activity   Alcohol use: Not Currently    Comment: occasional   Drug use: No   Sexual activity: Yes    Birth control/protection: I.U.D.  Other Topics Concern   Not on file  Social History Narrative   2 Step children; asthma, ADHD   Social Determinants of Health   Financial Resource Strain: Not on file  Food Insecurity: Not on file  Transportation Needs: Not on file  Physical Activity: Not on file   Stress: Not on file  Social Connections: Not on file    Allergies:  Allergies  Allergen Reactions   Fish Allergy Nausea And Vomiting   Augmentin [Amoxicillin-Pot Clavulanate] Diarrhea    Can take regular amoxicillin with no issues Has patient had a PCN reaction causing immediate rash, facial/tongue/throat swelling, SOB or lightheadedness with hypotension: No Has patient had a PCN reaction causing severe rash involving mucus membranes or  skin necrosis: No Has patient had a PCN reaction that required hospitalization: No Has patient had a PCN reaction occurring within the last 10 years: Yes--but DIARRHEA ONLY If all of the above answers are "NO", then may proceed with Cephalosporin [place]   Citalopram Other (See Comments)    Tachycardia    Eggs Or Egg-Derived Products Diarrhea and Nausea And Vomiting   Requip [Ropinirole]     Not tolerated    Sulfonamide Derivatives Itching    Metabolic Disorder Labs: Lab Results  Component Value Date   HGBA1C 5.6 01/16/2022   Lab Results  Component Value Date   PROLACTIN 5.7 01/16/2022   PROLACTIN 7.5 09/05/2015   Lab Results  Component Value Date   CHOL 158 06/11/2021   TRIG 68.0 06/11/2021   HDL 58.30 06/11/2021   CHOLHDL 3 06/11/2021   VLDL 13.6 06/11/2021   LDLCALC 86 06/11/2021   LDLCALC 89 04/05/2019   Lab Results  Component Value Date   TSH 0.90 01/16/2022   TSH 0.95 12/10/2021    Therapeutic Level Labs: No results found for: "LITHIUM" No results found for: "VALPROATE" No results found for: "CBMZ"  Current Medications: Current Outpatient Medications  Medication Sig Dispense Refill   calcium gluconate 500 MG tablet Take 1 tablet by mouth 2 (two) times daily.      Efinaconazole 10 % SOLN Apply 1 drop topically daily. 4 mL 11   estradiol (VIVELLE-DOT) 0.025 MG/24HR Place 1 patch onto the skin 2 (two) times a week.     hydrOXYzine (ATARAX) 25 MG tablet Take 1-2 tablets (25-50 mg total) by mouth at bedtime as needed for  anxiety. And sleep 180 tablet 0   Insulin Pen Needle (PEN NEEDLES) 31G X 6 MM MISC For use with victoza /saxenda 100 each 0   levonorgestrel (MIRENA) 20 MCG/24HR IUD 1 Intra Uterine Device (1 each total) by Intrauterine route once. 1 each 0   Multiple Vitamins-Iron (MULTI-VITAMIN/IRON PO) Take 1 tablet by mouth daily.      omeprazole (PRILOSEC OTC) 20 MG tablet Take 20 mg by mouth daily before breakfast.      rizatriptan (MAXALT) 10 MG tablet TAKE 1 TABLET BY MOUTH AS NEEDED FOR MIGRAINE. MAY REPEAT IN 2 HOURS IF NEEDED 10 tablet 2   topiramate (TOPAMAX) 100 MG tablet TAKE 1 TABLET BY MOUTH EVERY DAY 90 tablet 1   traZODone (DESYREL) 50 MG tablet Take 0.5-1 tablets (25-50 mg total) by mouth at bedtime as needed for sleep. 90 tablet 0   Vitamin D, Ergocalciferol, (DRISDOL) 1.25 MG (50000 UNIT) CAPS capsule TAKE 1 CAPSULE BY MOUTH ONE TIME PER WEEK 12 capsule 3   albuterol (VENTOLIN HFA) 108 (90 Base) MCG/ACT inhaler TAKE 2 PUFFS BY MOUTH EVERY 6 HOURS AS NEEDED FOR WHEEZE OR SHORTNESS OF BREATH (Patient not taking: Reported on 11/16/2022) 8.5 each 0   busPIRone (BUSPAR) 10 MG tablet Take 1 tablet (10 mg total) by mouth daily. 90 tablet 0   chlorpheniramine-HYDROcodone (TUSSIONEX) 10-8 MG/5ML Take 5 mLs by mouth at bedtime as needed for cough. (Patient not taking: Reported on 11/16/2022) 115 mL 0   clonazePAM (KLONOPIN) 0.5 MG tablet Take 1 tablet (0.5 mg total) by mouth as directed. Take 1 tablet once a day as needed for severe panic attacks only 10 tablet 0   FLUoxetine (PROZAC) 40 MG capsule Take 2 capsules (80 mg total) by mouth daily. 180 capsule 0   Liraglutide -Weight Management (SAXENDA) 18 MG/3ML SOPN Inject 0.6 mg into the skin  daily. Increase dose weekly as follows: Week 2: 1.2 mg daily ; Week 3: 1.8 mg daily; Week 4: 2.4 mg daily (Patient not taking: Reported on 11/16/2022) 9 mL 0   pramipexole (MIRAPEX) 0.125 MG tablet Take 1 tablet (0.125 mg total) by mouth 3 (three) times daily. (Patient not  taking: Reported on 11/16/2022) 90 tablet 2   No current facility-administered medications for this visit.     Musculoskeletal: Strength & Muscle Tone: within normal limits Gait & Station: normal Patient leans: N/A  Psychiatric Specialty Exam: Review of Systems  Psychiatric/Behavioral: Negative.    All other systems reviewed and are negative.   Blood pressure 107/68, pulse 79, temperature 98.1 F (36.7 C), height 5' 2"$  (1.575 m), weight 149 lb (67.6 kg), SpO2 99 %.Body mass index is 27.25 kg/m.  General Appearance: Casual  Eye Contact:  Fair  Speech:  Clear and Coherent  Volume:  Normal  Mood:  Euthymic  Affect:  Congruent  Thought Process:  Goal Directed and Descriptions of Associations: Intact  Orientation:  Full (Time, Place, and Person)  Thought Content: Logical   Suicidal Thoughts:  No  Homicidal Thoughts:  No  Memory:  Immediate;   Fair Recent;   Fair Remote;   Fair  Judgement:  Fair  Insight:  Fair  Psychomotor Activity:  Normal  Concentration:  Concentration: Fair and Attention Span: Fair  Recall:  [employer] of Knowledge: Fair  Language: Fair  Akathisia:  No  Handed:  Right  AIMS (if indicated): not done  Assets:  Armed forces logistics/support/administrative officer Desire for Improvement Housing Social Support Transportation  ADL's:  Intact  Cognition: WNL  Sleep:  Fair   Screenings: GAD-7    Personnel officer Visit from 11/16/2022 in [place] Office Visit from 10/21/2022 in [place] at [facility] Visit from 08/24/2022 in [place] Office Visit from 07/30/2022 in [place] Video Visit from 07/14/2022 in [place]  Total GAD-7 Score 4 0 17 21 12      $ PHQ2-9    [place] Office Visit from 11/16/2022 in [place] Office Visit  from 10/21/2022 in [place] at [facility] Visit from 08/24/2022 in [place] Office Visit from 07/30/2022 in [place] Video Visit from 07/14/2022 in [place]  PHQ-2 Total Score 1 0 2 6 1  $ PHQ-9 Total Score -- -- 5 22 --      [place] Office Visit from 11/16/2022 in [place] Office Visit from 08/24/2022 in [place] Office Visit from 07/30/2022 in [place] No Risk No Risk No Risk        Assessment and Plan: Jailee Galindez is a 56 year old Caucasian female, employed, divorced, has a history of PTSD, GAD, multiple medical problems was evaluated in office today.  Patient is currently stable.  Plan as noted below.  Plan PTSD-stable Continue CBT as needed Prozac 80 mg p.o. daily Reduce trazodone to 25-50 mg p.o. nightly, dose reduced due to side effects to higher doses of trazodone. Reduce BuSpar to 10 mg p.o. daily-patient only has been using once a day dosage of BuSpar and is currently doing well.  GAD-stable Prozac 80 mg p.o. daily Reduce BuSpar to  10 mg p.o. daily Hydroxyzine 25-50 mg p.o. nightly as needed for sleep and anxiety  Insomnia-stable Reduce trazodone to 25-50 mg p.o. nightly for sleep Dose reduced due to side effects to higher dosage of trazodone Pramipexole for restless leg symptoms as prescribed-patient is noncompliant encouraged to use it.  Panic attacks-stable Clonazepam 0.5 mg as needed for severe anxiety attacks only Reviewed [name] PMP AWARxE  Follow-up in clinic in 3 months or sooner if needed.   This note was generated in part or whole with voice recognition software. Voice recognition is usually quite accurate but there are transcription  errors that can and very often do occur. I apologize for any typographical errors that were not detected and corrected.     Ursula Alert, MD 11/17/2022, 2:05 PM

## 2022-12-04 ENCOUNTER — Other Ambulatory Visit: Payer: Self-pay | Admitting: Psychiatry

## 2022-12-04 DIAGNOSIS — G4700 Insomnia, unspecified: Secondary | ICD-10-CM

## 2022-12-04 DIAGNOSIS — F410 Panic disorder [episodic paroxysmal anxiety] without agoraphobia: Secondary | ICD-10-CM

## 2022-12-21 ENCOUNTER — Other Ambulatory Visit: Payer: Self-pay | Admitting: Internal Medicine

## 2022-12-21 ENCOUNTER — Encounter (INDEPENDENT_AMBULATORY_CARE_PROVIDER_SITE_OTHER): Payer: BC Managed Care – PPO

## 2022-12-21 DIAGNOSIS — F410 Panic disorder [episodic paroxysmal anxiety] without agoraphobia: Secondary | ICD-10-CM | POA: Diagnosis not present

## 2022-12-21 DIAGNOSIS — F411 Generalized anxiety disorder: Secondary | ICD-10-CM

## 2022-12-21 DIAGNOSIS — F4310 Post-traumatic stress disorder, unspecified: Secondary | ICD-10-CM

## 2022-12-21 DIAGNOSIS — G43019 Migraine without aura, intractable, without status migrainosus: Secondary | ICD-10-CM

## 2022-12-21 MED ORDER — BUSPIRONE HCL 10 MG PO TABS: 10.0000 mg | ORAL_TABLET | Freq: Two times a day (BID) | ORAL | 0 refills | Status: DC

## 2022-12-21 MED ORDER — CLONAZEPAM 1 MG PO TABS: 0.5000 mg | ORAL_TABLET | ORAL | 0 refills | Status: DC

## 2022-12-21 NOTE — Telephone Encounter (Signed)
Will increase BuSpar to 10 mg p.o. twice daily. Increase Klonopin to 0.5-1 mg once a day as needed for severe panic attacks.  Reviewed [place] PMP AWARxE Patient to limit use.  Patient to continue to work with therapist.

## 2022-12-23 NOTE — Telephone Encounter (Signed)
  I have spent at least 9 minutes non face to face with patient today .

## 2023-02-03 ENCOUNTER — Ambulatory Visit: Payer: BC Managed Care – PPO | Admitting: Podiatry

## 2023-02-03 ENCOUNTER — Ambulatory Visit (INDEPENDENT_AMBULATORY_CARE_PROVIDER_SITE_OTHER): Payer: BC Managed Care – PPO

## 2023-02-03 DIAGNOSIS — M21612 Bunion of left foot: Secondary | ICD-10-CM | POA: Diagnosis not present

## 2023-02-04 NOTE — Progress Notes (Signed)
  Subjective:  Patient ID: Selena [name], female    DOB: 05-12-1966,  MRN: [id number]  Chief Complaint  Patient presents with   Foot Pain    left foot pain ( pt thinks possible bunion) - she went to [facility] over spring break and it's been hurting since then - no history of Gout -had xrays in October 23 for something else    57 y.o. female presents with the above complaint. History confirmed with patient.  She got new shoes prior to her trip did quite a lot of walking  Objective:  Physical Exam: warm, good capillary refill, no trophic changes or ulcerative lesions, normal DP and PT pulses, normal sensory exam, and bilaterally with left worse than right in severity she has hallux valgus deformity, no pain to palpation good range of motion of the joint.  Radiographs: Multiple views x-ray of the left foot: Hallux valgus deformity present, bipartite tibial sesamoid noted Assessment:   1. Bunion, left foot      Plan:  Patient was evaluated and treated and all questions answered.  We discussed etiology and treatment options of bunion deformity including wider shoe silicone offloading pads which she is employing and use of topical anti-inflammatory such as Voltaren gel.  Also discussed corticosteroid injection.  Finally we discussed surgical correction if not improving.  I expect likely she is dealing with intermittent neuritis from pressure from her shoes and her trip her bunion previously was asymptomatic before this.  She will let me know if it worsens or does not improve and if she would like to proceed with surgery  Return if symptoms worsen or fail to improve.

## 2023-02-13 ENCOUNTER — Telehealth: Payer: Self-pay

## 2023-02-13 ENCOUNTER — Other Ambulatory Visit: Payer: Self-pay | Admitting: Psychiatry

## 2023-02-13 DIAGNOSIS — G4701 Insomnia due to medical condition: Secondary | ICD-10-CM

## 2023-02-13 NOTE — Telephone Encounter (Signed)
Prior Auth for saxenda has been sent to plan   Montie Abbasi (Key: [id number]) Rx #: [id number] Saxenda 18MG Ronny Bacon pen-injectors Form [employer] Electronic PA Form [phone] NCPDP)

## 2023-02-15 NOTE — Telephone Encounter (Signed)
noted 

## 2023-02-16 ENCOUNTER — Ambulatory Visit: Payer: BC Managed Care – PPO | Admitting: Psychiatry

## 2023-02-16 ENCOUNTER — Encounter: Payer: Self-pay | Admitting: Psychiatry

## 2023-02-16 VITALS — BP 101/68 | HR 70 | Temp 97.6°F | Ht 62.0 in | Wt 144.6 lb

## 2023-02-16 DIAGNOSIS — F411 Generalized anxiety disorder: Secondary | ICD-10-CM

## 2023-02-16 DIAGNOSIS — G4701 Insomnia due to medical condition: Secondary | ICD-10-CM

## 2023-02-16 DIAGNOSIS — F4310 Post-traumatic stress disorder, unspecified: Secondary | ICD-10-CM

## 2023-02-16 DIAGNOSIS — F410 Panic disorder [episodic paroxysmal anxiety] without agoraphobia: Secondary | ICD-10-CM

## 2023-02-16 MED ORDER — FLUOXETINE HCL 40 MG PO CAPS: 80.0000 mg | ORAL_CAPSULE | Freq: Every day | ORAL | 0 refills | Status: DC

## 2023-02-16 MED ORDER — BUSPIRONE HCL 10 MG PO TABS: 10.0000 mg | ORAL_TABLET | Freq: Two times a day (BID) | ORAL | 0 refills | Status: DC

## 2023-02-16 MED ORDER — HYDROXYZINE HCL 25 MG PO TABS: 25.0000 mg | ORAL_TABLET | Freq: Every evening | ORAL | 0 refills | Status: DC | PRN

## 2023-02-16 NOTE — Progress Notes (Unsigned)
BH MD OP Progress Note  02/16/2023 4:29 PM Selena [name]  MRN:  [id number]  Chief Complaint:  Chief Complaint  Patient presents with   Follow-up   Anxiety   Medication Refill   HPI: Selena [name] is a 57 year old Caucasian female, employed, divorced, currently lives in [place], has a history of PTSD, GAD, insomnia, history of gastric bypass surgery in 2017, history of pituitary adenoma, migraine headaches, gastroesophageal reflux disease, history of abnormal LFT was evaluated in the office today.  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  She reports although anxiety provoking she is currently working on strategies to deal with it.  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  She looks forward to that.  She has to get through some trainings during the summer to do that.  She looks forward to the summer and is planning to take a few trips.  Patient reports sleep was restless on and off.  Since it is the end of the school year and she since she has a lot to plan and to she has had a couple of nights a week when sleep has been restless.  She does have trazodone and hydroxyzine available.  Agreeable to start using the hydroxyzine extra dosage if she wakes up in the middle of the night.  Patient denies any suicidality, homicidality or perceptual disturbances.  Patient is currently compliant on the fluoxetine, BuSpar.  Denies side effects.  Patient denies any other concerns today.  Visit Diagnosis:    ICD-10-CM   1. PTSD (post-traumatic stress disorder)  F43.10 FLUoxetine (PROZAC) 40 MG capsule    busPIRone (BUSPAR) 10 MG tablet    2. GAD (generalized anxiety disorder)  F41.1 busPIRone (BUSPAR) 10 MG tablet    3. Insomnia due to medical condition  G47.01 hydrOXYzine (ATARAX) 25 MG tablet   anxiety    4. Panic attack  F41.0 busPIRone (BUSPAR) 10 MG tablet      Past Psychiatric History: I have reviewed past psychiatric history from  progress note on 04/22/2022.  Past trials of Wellbutrin, Celexa, nortriptyline, Paxil, Effexor, Klonopin, Requip, trazodone.  Past Medical History:  Past Medical History:  Diagnosis Date   Anemia, iron deficiency    Anxiety    Asthma    Colon polyps 2014   Complication of anesthesia    per patient report , woke during endoscopy in 2014 ; no issues with subsequent colonscopy also  in 2014    Costochondritis    srturggled wiht it since age 60 ; exacerbates with excessive movement of left arm; did phsycial therapy in 08-2017 and hasnt had issues with it since    Depression    Eating disorder    Binge eating   Fibromyalgia    Gallstones    GERD (gastroesophageal reflux disease)    Heart murmur    at birth; resolved    Hyperlipidemia    Hypertension    was due to birth control  , " as soon as they took it out, it went away  "     Past Surgical History:  Procedure Laterality Date   CHOLECYSTECTOMY     COLONOSCOPY     [facility] dr Arlyce Dice    COLONOSCOPY WITH PROPOFOL N/A 08/01/2021   Procedure: COLONOSCOPY WITH PROPOFOL;  Surgeon: Pasty Spillers, MD;  Location: [facility];  Service: Endoscopy;  Laterality: N/A;   EVALUATION UNDER ANESTHESIA WITH HEMORRHOIDECTOMY N/A 06/24/2018   Procedure: EXAM UNDER ANESTHESIA WITH EXTERNAL  HEMORRHOIDECTOMY;  Surgeon: Luretha Murphy, MD;  Location: [facility];  Service: General;  Laterality: N/A;   EXPLORATORY LAPAROTOMY  2000   for infertility   GANGLION CYST EXCISION     GASTRIC ROUX-EN-Y N/A 01/24/2018   Procedure: LAPAROSCOPIC ROUX-EN-Y GASTRIC BYPASS WITH UPPER ENDOSCOPY;  Surgeon: Luretha Murphy, MD;  Location: [facility];  Service: General;  Laterality: N/A;   SPHINCTEROTOMY N/A 06/24/2018   Procedure: LATERAL INTERNAL SPHINCTEROTOMY;  Surgeon: Luretha Murphy, MD;  Location: [facility];  Service: General;  Laterality: N/A;   TONSILLECTOMY  1989    Family Psychiatric History: I have reviewed family psychiatric history from progress  note on 04/22/2022.  Family History:  Family History  Problem Relation Age of Onset   Hyperlipidemia Mother    Heart disease Mother        Atrial fibrilation   Cancer Mother        ? Melanoma   Atrial fibrillation Mother    Depression Father    Bipolar disorder Father    Diabetes Father    Mental illness Father        Bipolar   Leukemia Father 40       MDS< then leukemia    Depression Sister    Cancer Sister        Clear cell sarcoma in leg   Hypothyroidism Sister    Arrhythmia Maternal Grandfather    Hypertension Maternal Grandmother    Heart disease Maternal Grandmother        Afib   Arrhythmia Maternal Grandmother    Arthritis Paternal Grandmother    ADD / ADHD Nephew    Anxiety disorder Nephew    ADD / ADHD Son    Autism spectrum disorder Son    Colon cancer Neg Hx    Rectal cancer Neg Hx    Pancreatic cancer Neg Hx     Social History: I have reviewed social history from progress note on 04/22/2022. Social History   Socioeconomic History   Marital status: Divorced    Spouse name: Not on file   Number of children: 1   Years of education: Not on file   Highest education level: Master's degree (e.g., MA, MS, MEng, MEd, MSW, MBA)  Occupational History   Occupation: Teaches preschool    Employer: [employer]  Tobacco Use   Smoking status: Never    Passive exposure: Never   Smokeless tobacco: Never  Vaping Use   Vaping Use: Never used  Substance and Sexual Activity   Alcohol use: Not Currently    Comment: occasional   Drug use: No   Sexual activity: Yes    Birth control/protection: I.U.D.  Other Topics Concern   Not on file  Social History Narrative   2 Step children; asthma, ADHD   Social Determinants of Health   Financial Resource Strain: Not on file  Food Insecurity: Not on file  Transportation Needs: Not on file  Physical Activity: Not on file  Stress: Not on file  Social Connections: Not on file    Allergies:  Allergies  Allergen  Reactions   Fish Allergy Nausea And Vomiting   Augmentin [Amoxicillin-Pot Clavulanate] Diarrhea    Can take regular amoxicillin with no issues Has patient had a PCN reaction causing immediate rash, facial/tongue/throat swelling, SOB or lightheadedness with hypotension: No Has patient had a PCN reaction causing severe rash involving mucus membranes or skin necrosis: No Has patient had a PCN reaction that required hospitalization: No Has patient had a PCN  reaction occurring within the last 10 years: Yes--but DIARRHEA ONLY If all of the above answers are "NO", then may proceed with Cephalosporin [place]   Citalopram Other (See Comments)    Tachycardia    Egg-Derived Products Diarrhea and Nausea And Vomiting   Requip [Ropinirole]     Not tolerated    Sulfonamide Derivatives Itching    Metabolic Disorder Labs: Lab Results  Component Value Date   HGBA1C 5.6 01/16/2022   Lab Results  Component Value Date   PROLACTIN 5.7 01/16/2022   PROLACTIN 7.5 09/05/2015   Lab Results  Component Value Date   CHOL 158 06/11/2021   TRIG 68.0 06/11/2021   HDL 58.30 06/11/2021   CHOLHDL 3 06/11/2021   VLDL 13.6 06/11/2021   LDLCALC 86 06/11/2021   LDLCALC 89 04/05/2019   Lab Results  Component Value Date   TSH 0.90 01/16/2022   TSH 0.95 12/10/2021    Therapeutic Level Labs: No results found for: "LITHIUM" No results found for: "VALPROATE" No results found for: "CBMZ"  Current Medications: Current Outpatient Medications  Medication Sig Dispense Refill   albuterol (VENTOLIN HFA) 108 (90 Base) MCG/ACT inhaler TAKE 2 PUFFS BY MOUTH EVERY 6 HOURS AS NEEDED FOR WHEEZE OR SHORTNESS OF BREATH 8.5 each 0   calcium gluconate 500 MG tablet Take 1 tablet by mouth 2 (two) times daily.      chlorpheniramine-HYDROcodone (TUSSIONEX) 10-8 MG/5ML Take 5 mLs by mouth at bedtime as needed for cough. 115 mL 0   Efinaconazole 10 % SOLN Apply 1 drop topically daily. 4 mL 11   estradiol (VIVELLE-DOT) 0.025 MG/24HR  Place 1 patch onto the skin 2 (two) times a week.     Insulin Pen Needle (PEN NEEDLES) 31G X 6 MM MISC For use with victoza /saxenda 100 each 0   levonorgestrel (MIRENA) 20 MCG/24HR IUD 1 Intra Uterine Device (1 each total) by Intrauterine route once. 1 each 0   Liraglutide -Weight Management (SAXENDA) 18 MG/3ML SOPN Inject 0.6 mg into the skin daily. Increase dose weekly as follows: Week 2: 1.2 mg daily ; Week 3: 1.8 mg daily; Week 4: 2.4 mg daily 9 mL 0   Multiple Vitamins-Iron (MULTI-VITAMIN/IRON PO) Take 1 tablet by mouth daily.      omeprazole (PRILOSEC OTC) 20 MG tablet Take 20 mg by mouth daily before breakfast.      pramipexole (MIRAPEX) 0.125 MG tablet Take 1 tablet (0.125 mg total) by mouth 3 (three) times daily. 90 tablet 2   rizatriptan (MAXALT) 10 MG tablet TAKE 1 TABLET BY MOUTH AS NEEDED FOR MIGRAINE. MAY REPEAT IN 2 HOURS IF NEEDED 10 tablet 2   topiramate (TOPAMAX) 100 MG tablet TAKE 1 TABLET BY MOUTH EVERY DAY 90 tablet 1   traZODone (DESYREL) 50 MG tablet TAKE 0.5-1 TABLETS BY MOUTH AT BEDTIME AS NEEDED FOR SLEEP. 90 tablet 0   Vitamin D, Ergocalciferol, (DRISDOL) 1.25 MG (50000 UNIT) CAPS capsule TAKE 1 CAPSULE BY MOUTH ONE TIME PER WEEK 12 capsule 3   busPIRone (BUSPAR) 10 MG tablet Take 1 tablet (10 mg total) by mouth 2 (two) times daily. 180 tablet 0   clonazePAM (KLONOPIN) 1 MG tablet Take 0.5-1 tablets (0.5-1 mg total) by mouth as directed. Take only for severe anxiety attacks , once daily as needed.Please limit use 15 tablet 0   FLUoxetine (PROZAC) 40 MG capsule Take 2 capsules (80 mg total) by mouth daily. 180 capsule 0   hydrOXYzine (ATARAX) 25 MG tablet Take 1-2 tablets (25-50  mg total) by mouth at bedtime as needed for anxiety. And sleep 180 tablet 0   No current facility-administered medications for this visit.     Musculoskeletal: Strength & Muscle Tone: within normal limits Gait & Station: normal Patient leans: N/A  Psychiatric Specialty Exam: Review of  Systems  Psychiatric/Behavioral:  The patient is nervous/anxious.   All other systems reviewed and are negative.   Blood pressure 101/68, pulse 70, temperature 97.6 F (36.4 C), temperature source Skin, height 5\' 2"  (1.575 m), weight 144 lb 9.6 oz (65.6 kg).Body mass index is 26.45 kg/m.  General Appearance: Casual  Eye Contact:  Fair  Speech:  Clear and Coherent  Volume:  Normal  Mood:  Anxious managing okay  Affect:  Congruent  Thought Process:  Goal Directed and Descriptions of Associations: Intact  Orientation:  Full (Time, Place, and Person)  Thought Content: Logical   Suicidal Thoughts:  No  Homicidal Thoughts:  No  Memory:  Immediate;   Fair Recent;   Fair Remote;   Fair  Judgement:  Fair  Insight:  Fair  Psychomotor Activity:  Normal  Concentration:  Concentration: Fair and Attention Span: Fair  Recall:  [employer] of Knowledge: Fair  Language: Fair  Akathisia:  No  Handed:  Right  AIMS (if indicated): not done  Assets:  Communication Skills Desire for Improvement Housing Social Support  ADL's:  Intact  Cognition: WNL  Sleep:   Restless at times   Screenings: GAD-7    Loss adjuster, chartered Office Visit from 02/16/2023 in [facility] Office Visit from 11/16/2022 in [facility] Office Visit from 10/21/2022 in [facility] Visit from 08/24/2022 in [facility] Office Visit from 07/30/2022 in [facility]  Total GAD-7 Score 4 4 0 17 21      PHQ2-9    Flowsheet Row Office Visit from 02/16/2023 in [facility] Office Visit from 11/16/2022 in [facility] Office Visit from 10/21/2022 in [facility] Visit from 08/24/2022 in [facility] Office Visit from 07/30/2022 in [facility]  PHQ-2 Total Score 0 1 0 2 6  PHQ-9 Total Score 1 -- -- 5 22      Flowsheet Row Office Visit from 11/16/2022 in [facility] Office Visit from 08/24/2022 in [facility] Office Visit from 07/30/2022 in [facility]  C-SSRS RISK CATEGORY No Risk No Risk No Risk        Assessment and Plan: Selena [name] is a 57 year old Caucasian female, employed, divorced, has a history of PTSD, GAD, multiple medical problems was evaluated in office today.  Patient is currently stable.  Plan as noted below.  Plan PTSD-stable Continue CBT as needed Prozac 80 mg p.o. daily Trazodone 25-50 mg p.o. nightly as needed.  She had side effects to higher doses of trazodone. BuSpar 10 mg p.o. twice daily  GAD-stable Prozac 80 mg p.o. daily BuSpar 10 mg p.o. twice daily Hydroxyzine 25-50 mg p.o. nightly as needed for anxiety  Insomnia-stable Patient to work on sleep hygiene techniques Trazodone 25-50 mg p.o. nightly for sleep-reduced dosage. Patient is not interested in making any medication changes and reports current sleep issues as situational. She does have pramipexole for restless leg  symptoms prescribed by primary provider. She also has hydroxyzine as needed available which she agrees to use as needed if sleep is interrupted.  Panic attack-stable Clonazepam 0.5 mg as needed for severe anxiety attacks Reviewed [place] AWARxE  Follow-up in clinic in 3 to 4 months or sooner if needed.    Consent: Patient/Guardian gives verbal consent for treatment and assignment of benefits for services provided during this visit. Patient/Guardian expressed understanding and agreed to proceed.   This note was generated in part or whole with voice recognition software. Voice  recognition is usually quite accurate but there are transcription errors that can and very often do occur. I apologize for any typographical errors that were not detected and corrected.      Jomarie Longs, MD 02/16/2023, 4:29 PM

## 2023-03-01 ENCOUNTER — Encounter: Payer: Self-pay | Admitting: Podiatry

## 2023-03-08 ENCOUNTER — Encounter: Payer: Self-pay | Admitting: Internal Medicine

## 2023-03-08 DIAGNOSIS — Z79899 Other long term (current) drug therapy: Secondary | ICD-10-CM

## 2023-03-11 ENCOUNTER — Other Ambulatory Visit (INDEPENDENT_AMBULATORY_CARE_PROVIDER_SITE_OTHER): Payer: BC Managed Care – PPO

## 2023-03-11 DIAGNOSIS — Z79899 Other long term (current) drug therapy: Secondary | ICD-10-CM

## 2023-03-11 DIAGNOSIS — E876 Hypokalemia: Secondary | ICD-10-CM

## 2023-03-12 ENCOUNTER — Encounter: Payer: Self-pay | Admitting: Internal Medicine

## 2023-03-12 DIAGNOSIS — E876 Hypokalemia: Secondary | ICD-10-CM | POA: Insufficient documentation

## 2023-03-12 LAB — COMPREHENSIVE METABOLIC PANEL
ALT: 37 U/L — ABNORMAL HIGH (ref 0–35)
AST: 31 U/L (ref 0–37)
Albumin: 4 g/dL (ref 3.5–5.2)
Alkaline Phosphatase: 68 U/L (ref 39–117)
BUN: 18 mg/dL (ref 6–23)
CO2: 22 mEq/L (ref 19–32)
Calcium: 8.7 mg/dL (ref 8.4–10.5)
Chloride: 109 mEq/L (ref 96–112)
Creatinine, Ser: 0.89 mg/dL (ref 0.40–1.20)
GFR: 73.25 mL/min (ref >60.00)
Glucose, Bld: 69 mg/dL — ABNORMAL LOW (ref 70–99)
Potassium: 3.4 mEq/L — ABNORMAL LOW (ref 3.5–5.1)
Sodium: 139 mEq/L (ref 135–145)
Total Bilirubin: 0.3 mg/dL (ref 0.2–1.2)
Total Protein: 6.4 g/dL (ref 6.0–8.3)

## 2023-03-12 MED ORDER — POTASSIUM CHLORIDE CRYS ER 20 MEQ PO TBCR: 20.0000 meq | EXTENDED_RELEASE_TABLET | Freq: Every day | ORAL | 0 refills | Status: DC

## 2023-03-12 NOTE — Addendum Note (Signed)
Addended by: Sherlene Shams on: 03/12/2023 10:24 PM   Modules accepted: Orders

## 2023-03-16 MED ORDER — TOPIRAMATE 100 MG PO TABS: 200.0000 mg | ORAL_TABLET | Freq: Every day | ORAL | 1 refills | Status: DC

## 2023-03-18 ENCOUNTER — Telehealth: Payer: Self-pay | Admitting: Urology

## 2023-03-18 ENCOUNTER — Ambulatory Visit: Payer: BC Managed Care – PPO | Admitting: Podiatry

## 2023-03-18 ENCOUNTER — Encounter: Payer: Self-pay | Admitting: Podiatry

## 2023-03-18 DIAGNOSIS — M21612 Bunion of left foot: Secondary | ICD-10-CM | POA: Diagnosis not present

## 2023-03-18 DIAGNOSIS — M21622 Bunionette of left foot: Secondary | ICD-10-CM

## 2023-03-18 NOTE — Telephone Encounter (Signed)
DOS - 04/14/23  AIKEN OSTEOTOMY LEFT --- [id number] Seraf[address] Royals LEFT --- [id number] TAILORS BUNIONECTOMY LEFT --- [id number]  [employer]BS EFFECTIVE DATE - 10/05/22  DEDUCTIBLE - $1,250.00 W/ $1,250.00 REMAINING  OOP - $4,890.00 W/ $4,575.73 REMAINING COINSURANCE - 20%   SPOKE WITH IURO R. WITH [employer] AND SHE STATED THAT FOR CPT CODES 09811, [id number] AND [id number] NO PRIOR AUTH IS REQUIRED.   CALL REF # IURO R. 03/18/23 AT 14:14 EST

## 2023-03-18 NOTE — Progress Notes (Signed)
Subjective:  Patient ID: Selena [name], female    DOB: 12-14-1965,  MRN: [id number]  Chief Complaint  Patient presents with   Bunions    Rm 22 Pt is in office today for left foot injection.     57 y.o. female presents with concern for bunion pain on the left foot.  She has pain in the medial eminence related to bunion deformity.  Has pain around the great toe joint as well.  She previously saw Dr. Lilian Kapur who thought she could initially try conservative measures including different shoes and anti-inflammatory medications as well as gel padding devices and bunion splint.  She has tried these devices and they have not been effective.  They did discuss that if these fail she would need to consider surgical intervention for the bunion correction  Past Medical History:  Diagnosis Date   Anemia, iron deficiency    Anxiety    Asthma    Colon polyps 2014   Complication of anesthesia    per patient report , woke during endoscopy in 2014 ; no issues with subsequent colonscopy also  in 2014    Costochondritis    srturggled wiht it since age 21 ; exacerbates with excessive movement of left arm; did phsycial therapy in 08-2017 and hasnt had issues with it since    Depression    Eating disorder    Binge eating   Fibromyalgia    Gallstones    GERD (gastroesophageal reflux disease)    Heart murmur    at birth; resolved    Hyperlipidemia    Hypertension    was due to birth control  , " as soon as they took it out, it went away  "     Allergies  Allergen Reactions   Fish Allergy Nausea And Vomiting   Augmentin [Amoxicillin-Pot Clavulanate] Diarrhea    Can take regular amoxicillin with no issues Has patient had a PCN reaction causing immediate rash, facial/tongue/throat swelling, SOB or lightheadedness with hypotension: No Has patient had a PCN reaction causing severe rash involving mucus membranes or skin necrosis: No Has patient had a PCN reaction that required hospitalization: No Has  patient had a PCN reaction occurring within the last 10 years: Yes--but DIARRHEA ONLY If all of the above answers are "NO", then may proceed with Cephalosporin [place]   Citalopram Other (See Comments)    Tachycardia    Egg-Derived Products Diarrhea and Nausea And Vomiting   Requip [Ropinirole]     Not tolerated    Sulfonamide Derivatives Itching    ROS: Negative except as per HPI above  Objective:  General: AAO x3, NAD  Dermatological: With inspection and palpation of the right and left lower extremities there are no open sores, no preulcerative lesions, no rash or signs of infection present. Nails are of normal length thickness and coloration.   Vascular:  Dorsalis Pedis artery and Posterior Tibial artery pedal pulses are 2/4 bilateral.  Capillary fill time < 3 sec to all digits.   Neruologic: Grossly intact via light touch bilateral. Protective threshold intact to all sites bilateral.   Musculoskeletal: Bunion deformity is noted on the left foot with increased prominence of the medial eminence and increased valgus position of the great toe.  Is abutting the second toe.  There is pain with palpation about the medial aspect of the forefoot around the medial eminence.  No significant first ray hypermobility noted on exam.  There is mild prominence over the lateral aspect of the fifth metatarsal  head but no significant pain with palpation of this area.  Gait: Unassisted, Nonantalgic.   No images are attached to the encounter.  Radiographs:  Date: 02/03/2023 XR the left foot Weightbearing AP/Lateral/Oblique   Findings: Increased first intermetatarsal space angle.  Bipartite sesamoid is noted.  Hallux valgus angle is increased. no significant dorsiflexion of the first metatarsal seen on lateral view. Assessment:   1. Bunion, left foot   2. Tailor's bunion of left foot      Plan:  Patient was evaluated and treated and all questions answered.  # Painful bunion of left foot # Tailor's  bunion of left foot -Discussed with patient that she does have pain related to her bunion deformity of the left foot. -Discussed conservative and surgical treatment options with the patient detail -She has failed conservative measures and wishes to proceed with surgical intervention at this point -Discussed different bunion surgeries including Lapidus bunionectomy versus minimally invasive bunionectomy with distal first metatarsal osteotomy. -Additionally the patient has some mild pain on the outside of the foot however it is not severe and the x-ray does not support doing a procedure for tailor's bunionectomy at this point in time though she may need at some point in future -She would prefer to have the minimally invasive bunionectomy procedure as she values faster return to weightbearing.  Did discuss that there is a  higher chance of recurrence with this option versus the Lapidus procedure however the recovery is generally smoother with the minimally invasive bunionectomy -Discussed risk benefits alternatives and possible complications associated with them invasive bunionectomy with distal first metatarsal osteotomy and possible Akin osteotomy.  Discussed the expected postop recovery course patient wishes to proceed. -Informed surgical consent was obtained at this visit we will begin surgical planning.  Return for aftert OR.          Corinna Gab, DPM [facility] / [facility]

## 2023-04-11 ENCOUNTER — Other Ambulatory Visit: Payer: Self-pay | Admitting: Internal Medicine

## 2023-04-13 ENCOUNTER — Other Ambulatory Visit (INDEPENDENT_AMBULATORY_CARE_PROVIDER_SITE_OTHER): Payer: BC Managed Care – PPO | Admitting: Podiatry

## 2023-04-13 DIAGNOSIS — Z9889 Other specified postprocedural states: Secondary | ICD-10-CM

## 2023-04-13 MED ORDER — OXYCODONE-ACETAMINOPHEN 5-325 MG PO TABS: 1.0000 | ORAL_TABLET | ORAL | 0 refills | Status: DC | PRN

## 2023-04-13 MED ORDER — IBUPROFEN 600 MG PO TABS: 600.0000 mg | ORAL_TABLET | Freq: Three times a day (TID) | ORAL | 0 refills | Status: DC | PRN

## 2023-04-13 MED ORDER — CLINDAMYCIN HCL 300 MG PO CAPS: 300.0000 mg | ORAL_CAPSULE | Freq: Three times a day (TID) | ORAL | 0 refills | Status: AC

## 2023-04-13 MED ORDER — ASPIRIN 81 MG PO TBEC: 81.0000 mg | DELAYED_RELEASE_TABLET | Freq: Two times a day (BID) | ORAL | 0 refills | Status: DC

## 2023-04-13 NOTE — Progress Notes (Signed)
Post-op meds sent

## 2023-04-14 DIAGNOSIS — M2012 Hallux valgus (acquired), left foot: Secondary | ICD-10-CM | POA: Diagnosis not present

## 2023-04-15 ENCOUNTER — Telehealth: Payer: Self-pay | Admitting: Podiatry

## 2023-04-15 ENCOUNTER — Encounter: Payer: Self-pay | Admitting: Podiatry

## 2023-04-15 NOTE — Telephone Encounter (Signed)
Patient called to let us know she fell yesterday.  She just had foot surgery by Dr. Annamary Rummage.  She states she is not used to crutches and fell onto her face.  She has a chipped tooth and cut her lip and got it stuck between her teeth.  She says the foot is okay, she did not land on it, nor did it strike against anything to make her fall.  She has appt with dentist today.  The surgery center advised her to notify Dr. Annamary Rummage when they called her to follow up and check in.    Patient confirmed the surgical foot wasn't injured and has no additional pain.  Denies bleed-through on bandage.    Will have her f/u as scheduled.

## 2023-04-16 ENCOUNTER — Other Ambulatory Visit (INDEPENDENT_AMBULATORY_CARE_PROVIDER_SITE_OTHER): Payer: BC Managed Care – PPO | Admitting: Podiatry

## 2023-04-16 ENCOUNTER — Telehealth: Payer: Self-pay | Admitting: Podiatry

## 2023-04-16 MED ORDER — HYDROCODONE-ACETAMINOPHEN 5-325 MG PO TABS: 1.0000 | ORAL_TABLET | Freq: Four times a day (QID) | ORAL | 0 refills | Status: DC | PRN

## 2023-04-16 MED ORDER — ONDANSETRON HCL 4 MG PO TABS: 4.0000 mg | ORAL_TABLET | Freq: Three times a day (TID) | ORAL | 0 refills | Status: DC | PRN

## 2023-04-16 NOTE — Progress Notes (Signed)
Rx for Zofran and Vicodin sent

## 2023-04-16 NOTE — Telephone Encounter (Signed)
Patient called.  Had surgery yesterday.  She took the oxycodone + acetaminophen last night and had severe nausea.    She is requesting something else, before the weekend, that might not upset her stomach so much

## 2023-04-19 ENCOUNTER — Telehealth: Payer: Self-pay | Admitting: Podiatry

## 2023-04-19 NOTE — Telephone Encounter (Signed)
Patient called.  Had bunion surgery last week.  Describes the bottom of her foot is sticking out the "hole in my boot".  Wanting to know if that's normal.  She isn't putting weight on it, so she says it doesn't really matter, but was just wondering if that's normal.  I asked Dr. Charlsie Merles if the surgery center's boots/shoes are open at both end (the old school ones), and he didn't believe so.    I can't quite picture what she's talking about to be able to assist her.

## 2023-04-22 ENCOUNTER — Encounter: Payer: Self-pay | Admitting: Podiatry

## 2023-04-22 ENCOUNTER — Ambulatory Visit (INDEPENDENT_AMBULATORY_CARE_PROVIDER_SITE_OTHER): Payer: BC Managed Care – PPO | Admitting: Podiatry

## 2023-04-22 ENCOUNTER — Ambulatory Visit (INDEPENDENT_AMBULATORY_CARE_PROVIDER_SITE_OTHER): Payer: BC Managed Care – PPO

## 2023-04-22 VITALS — BP 110/60 | HR 86

## 2023-04-22 DIAGNOSIS — Z9889 Other specified postprocedural states: Secondary | ICD-10-CM

## 2023-04-22 DIAGNOSIS — M21612 Bunion of left foot: Secondary | ICD-10-CM

## 2023-04-22 DIAGNOSIS — M21622 Bunionette of left foot: Secondary | ICD-10-CM

## 2023-04-22 NOTE — Progress Notes (Signed)
  Subjective:  Patient ID: Selena [name], female    DOB: 04-29-1966,  MRN: [id number]  Chief Complaint  Patient presents with   Routine Post Op    "I guess it's okay, my foot wasn't in the boot right.  I called but no one called me back."    DOS: 04/14/2023 Procedure: Left foot minimally invasive bunionectomy with distal first metatarsal osteotomy and Akin osteotomy  57 y.o. female returns for post-op check.  Patient reports for first postop check approximately 1 week status post left foot bunionectomy with minimally invasive technique.  Patient does report she had a fall but she had a cam boot on the time and does not think she hit her foot.  She says the pain is managed in her left foot at this time.  Using a knee scooter for mobility.    Review of Systems: Negative except as noted in the HPI. Denies N/V/F/Ch.   Objective:   Vitals:   04/22/23 1341  BP: 110/60  Pulse: 86   There is no height or weight on file to calculate BMI. Constitutional Well developed. Well nourished.  Vascular Foot warm and well perfused. Capillary refill normal to all digits.  Calf is soft and supple, no posterior calf or knee pain, negative Homans' sign  Neurologic Normal speech. Oriented to person, place, and time. Epicritic sensation to light touch grossly present bilaterally.  Dermatologic Skin healing well without signs of infection. Skin edges well coapted without signs of infection.  Orthopedic: Minimal tenderness and very little edema noted to palpation noted about the surgical site., good for first post op   Multiple view plain film radiographs: 04/22/2019 XR 3 views AP lateral oblique nonweightbearing.  Findings: Assessment:   1. Status post surgery   2. Post-operative state   3. Bunion, left foot   4. Tailor's bunion of left foot    Plan:  Patient was evaluated and treated and all questions answered.  S/p foot surgery left foot Bunionectomy and Akin osteotomy -Progressing as  expected post-operatively.  Overall doing well first postop visit minimal edema and pain is controlled -XR: As above no acute complication -WB Status: Nonweightbearing in cam boot -Sutures: Remain intact until next visit. -Medications: Ibuprofen and Percocet as needed for pain -Foot redressed, remain intact until next visit  Return in about 1 week (around 04/29/2023) for 2nd POV L foot.         Corinna Gab, DPM [facility] / [facility]

## 2023-04-28 ENCOUNTER — Telehealth: Payer: Self-pay

## 2023-04-28 NOTE — Telephone Encounter (Signed)
Patient called, reporting some new swelling and discoloration. Spoke to patient and advised that swelling and bruising are absolutely normal after bunion surgery. Advised to ice and elevated. She is not having pain. Advised to keep her next post op appointment on 04/29/23.

## 2023-04-29 ENCOUNTER — Ambulatory Visit (INDEPENDENT_AMBULATORY_CARE_PROVIDER_SITE_OTHER): Payer: BC Managed Care – PPO | Admitting: Podiatry

## 2023-04-29 DIAGNOSIS — Z9889 Other specified postprocedural states: Secondary | ICD-10-CM

## 2023-04-29 DIAGNOSIS — M21612 Bunion of left foot: Secondary | ICD-10-CM

## 2023-04-29 NOTE — Progress Notes (Signed)
  Subjective:  Patient ID: Selena [name], female    DOB: 11-20-1965,  MRN: [id number]  Chief Complaint  Patient presents with   Routine Post Op    POV # 2 DOS 04/14/23 --- LEFT FOOT BUNIONECTOMY, POSSIBLE TAILORS BUNIONECTOMY    DOS: 04/14/2023 Procedure: Left foot minimally invasive bunionectomy with distal first metatarsal osteotomy and Akin osteotomy  57 y.o. female returns for post-op check.  Patient reports for postop check approximately 2 week status post left foot bunionectomy with minimally invasive technique.  Has been nonweightbearing to the left lower extremity with the aid of a knee scooter and cam boot.  The dressing clean dry and intact since last visit.  She is doing very well has minimal to no pain and has not needed any pain medications recently says the swelling has been decreasing no concerns.  Very happy with the outcome in terms of how the foot looks.   Review of Systems: Negative except as noted in the HPI. Denies N/V/F/Ch.   Objective:   There were no vitals filed for this visit.  There is no height or weight on file to calculate BMI. Constitutional Well developed. Well nourished.  Vascular Foot warm and well perfused. Capillary refill normal to all digits.  Calf is soft and supple, no posterior calf or knee pain, negative Homans' sign  Neurologic Normal speech. Oriented to person, place, and time. Epicritic sensation to light touch grossly present bilaterally.  Dermatologic Skin healing well without signs of infection. Skin edges well coapted without signs of infection.  Orthopedic: Minimal tenderness and very little edema noted to palpation noted about the surgical site.   Multiple view plain film radiographs: Deferred Assessment:   1. Post-operative state   2. Bunion, left foot     Plan:  Patient was evaluated and treated and all questions answered.  S/p foot surgery left foot Bunionectomy and Akin osteotomy -Progressing as expected  post-operatively.  Overall doing well first postop visit minimal edema and pain is controlled -XR: Deferred at this visit -WB Status: Begin addition to weightbearing in cam boot over the next 1 to 2 weeks as she feels able to. -Sutures: Removed total at this visit -Medications: Ibuprofen as needed for pain -Sutures removed.  Patient is now okay to get the foot wet and warm soapy water and dry thoroughly.  Wear an Ace wrap or bandage over the incisions for protection sake.  Return in about 2 weeks (around 05/13/2023) for 3rd POV L foot bunion repair.         Corinna Gab, DPM [facility] / [facility]

## 2023-05-10 ENCOUNTER — Other Ambulatory Visit: Payer: Self-pay | Admitting: Psychiatry

## 2023-05-10 DIAGNOSIS — G4701 Insomnia due to medical condition: Secondary | ICD-10-CM

## 2023-05-14 ENCOUNTER — Encounter: Payer: BC Managed Care – PPO | Admitting: Podiatry

## 2023-05-14 ENCOUNTER — Other Ambulatory Visit: Payer: Self-pay | Admitting: Psychiatry

## 2023-05-14 ENCOUNTER — Ambulatory Visit (INDEPENDENT_AMBULATORY_CARE_PROVIDER_SITE_OTHER): Payer: BC Managed Care – PPO | Admitting: Podiatry

## 2023-05-14 ENCOUNTER — Ambulatory Visit (INDEPENDENT_AMBULATORY_CARE_PROVIDER_SITE_OTHER): Payer: BC Managed Care – PPO

## 2023-05-14 DIAGNOSIS — Z9889 Other specified postprocedural states: Secondary | ICD-10-CM

## 2023-05-14 DIAGNOSIS — F4310 Post-traumatic stress disorder, unspecified: Secondary | ICD-10-CM

## 2023-05-14 DIAGNOSIS — M21612 Bunion of left foot: Secondary | ICD-10-CM

## 2023-05-14 NOTE — Progress Notes (Signed)
  Subjective:  Patient ID: Selena [name], female    DOB: 27-Apr-1966,  MRN: [id number]  Chief Complaint  Patient presents with   Routine Post Op    POV # 3 DOS 04/14/23 --- LEFT FOOT BUNIONECTOMY, POSSIBLE TAILORS BUNIONECTOMY    DOS: 04/14/2023 Procedure: Left foot minimally invasive bunionectomy with distal first metatarsal osteotomy and Akin osteotomy  57 y.o. female returns for post-op check.  Patient reports for postop check approximately 4 week status post left foot bunionectomy with minimally invasive technique.  Has been weightbearing in a boot.   She is doing very well has minimal to no pain and has been moving the great toe.  She states that she has some swelling around the area at times after she has been on it for for a while but overall minimal swelling.  Reports the pain is less when she is in her regular sneakers then if she is in the boot at this point   Review of Systems: Negative except as noted in the HPI. Denies N/V/F/Ch.   Objective:   There were no vitals filed for this visit.  There is no height or weight on file to calculate BMI. Constitutional Well developed. Well nourished.  Vascular Foot warm and well perfused. Capillary refill normal to all digits.  Calf is soft and supple, no posterior calf or knee pain, negative Homans' sign  Neurologic Normal speech. Oriented to person, place, and time. Epicritic sensation to light touch grossly present bilaterally.  Dermatologic Skin healing well without signs of infection. Skin edges well coapted without signs of infection.  Orthopedic: Minimal tenderness and very little edema noted to palpation noted about the surgical site.   Multiple view plain film radiographs: XR 3 views AP lateral oblique 05/14/2023 weightbearing left foot.  Findings: Attention directed to the first metatarsal there is notably decreased first and metatarsal angle with lateral shift of the first metatarsal head.  Hardware is intact with no breakage  noted.  Aiken osteotomy screw is intact with no issues.  Maintained alignment from inial postoperative. Assessment:   1. Post-operative state   2. Bunion, left foot     Plan:  Patient was evaluated and treated and all questions answered.  S/p foot surgery left foot Bunionectomy and Akin osteotomy -Progressing as expected post-operatively.  Overall doing well at this visit, her pain is well controlled.  - States pain less out of the boot that in it -XR: As above no osseous complication alignment is maintained and hardware intact -WB Status: Begin transition out of the cam boot to weightbearing as tolerated in regular shoes over the next 1 to 2 weeks and then full-time shoes -Medications: Ibuprofen as needed for pain -Discussed patient is healing her bunionectomy very well at a month out she is already walking in the boot without any pain and is healing very quickly.  Will transition her out of the cam boot over the next 1 to 2 weeks.  Continue with activity modification to avoid high step count or impact activities.  -Continue range of motion exercises for the first MPJ  Return in about 4 weeks (around 06/11/2023) for 4th POV L foot.         Corinna Gab, DPM [facility] / [facility]

## 2023-06-10 ENCOUNTER — Ambulatory Visit (INDEPENDENT_AMBULATORY_CARE_PROVIDER_SITE_OTHER): Payer: BC Managed Care – PPO | Admitting: Podiatry

## 2023-06-10 DIAGNOSIS — Z9889 Other specified postprocedural states: Secondary | ICD-10-CM

## 2023-06-10 DIAGNOSIS — M21612 Bunion of left foot: Secondary | ICD-10-CM

## 2023-06-10 NOTE — Progress Notes (Signed)
  Subjective:  Patient ID: Selena [name], female    DOB: [birth year],  MRN: [id number]  Chief Complaint  Patient presents with   Routine Post Op    POV # 4 DOS 04/14/23 --- LEFT FOOT BUNIONECTOMY, POSSIBLE TAILORS BUNIONECTOMY    DOS: 04/14/2023 Procedure: Left foot minimally invasive bunionectomy with distal first metatarsal osteotomy and Akin osteotomy  57 y.o. female returns for post-op check.  Patient reports for postop check approximately 8 week status post left foot bunionectomy with minimally invasive technique.  Has been weightbearing in regular shoes and has been back to work.  She states she is having increased pain and swelling about the great toe joint.  Feels that she is not able to move her toe is much as she was previously   Review of Systems: Negative except as noted in the HPI. Denies N/V/F/Ch.   Objective:   There were no vitals filed for this visit.  There is no height or weight on file to calculate BMI. Constitutional Well developed. Well nourished.  Vascular Foot warm and well perfused. Capillary refill normal to all digits.  Calf is soft and supple, no posterior calf or knee pain, negative Homans' sign  Neurologic Normal speech. Oriented to person, place, and time. Epicritic sensation to light touch grossly present bilaterally.  Dermatologic Skin healing well without signs of infection. Skin edges well coapted without signs of infection.  Orthopedic: Mild tenderness and edema noted about the first metatarsal phalangeal joint.  Decreased plantarflexion range of motion of the hallux.   Multiple view plain film radiographs: Deferred at this visit Assessment:   1. Post-operative state   2. Bunion, left foot      Plan:  Patient was evaluated and treated and all questions answered.  S/p foot surgery left foot Bunionectomy and Akin osteotomy -Progressing as expected post-operatively.   -Patient continues to have some swelling around the first  metatarsophalangeal joint -Recommend return to the cam boot for short time -XR: Deferred at this visit -WB Status: Recommend right turned to cam boot for 2 weeks and then get back to regular shoes see if this decreases some of her pain and edema in the great toe joint -Medications: Ibuprofen as needed for pain -Patient may be experiencing increased pain due to getting back to work and being very active at work doing 7000 steps a day.  Recommend decrease weightbearing activity and daily step count as she is able to -Continue range of motion exercises for the first MPJ  No follow-ups on file.         Corinna Gab, DPM [facility] / [facility]

## 2023-06-14 ENCOUNTER — Telehealth (INDEPENDENT_AMBULATORY_CARE_PROVIDER_SITE_OTHER): Payer: BC Managed Care – PPO | Admitting: Psychiatry

## 2023-06-14 ENCOUNTER — Encounter: Payer: Self-pay | Admitting: Psychiatry

## 2023-06-14 DIAGNOSIS — F4310 Post-traumatic stress disorder, unspecified: Secondary | ICD-10-CM

## 2023-06-14 DIAGNOSIS — G4701 Insomnia due to medical condition: Secondary | ICD-10-CM

## 2023-06-14 DIAGNOSIS — F411 Generalized anxiety disorder: Secondary | ICD-10-CM

## 2023-06-14 NOTE — Progress Notes (Unsigned)
Virtual Visit via Video Note  I connected with Selena [name] on 06/14/23 at  4:30 PM EDT by a video enabled telemedicine application and verified that I am speaking with the correct person using two identifiers.  Location Provider Location : [facility] Patient Location : Work  Participants: Patient , Provider   I discussed the limitations of evaluation and management by telemedicine and the availability of in person appointments. The patient expressed understanding and agreed to proceed.    I discussed the assessment and treatment plan with the patient. The patient was provided an opportunity to ask questions and all were answered. The patient agreed with the plan and demonstrated an understanding of the instructions.   The patient was advised to call back or seek an in-person evaluation if the symptoms worsen or if the condition fails to improve as anticipated.   BH MD OP Progress Note  06/14/2023 4:59 PM Selena [name]  MRN:  [id number]  Chief Complaint:  Chief Complaint  Patient presents with   Follow-up   Anxiety   Depression   Medication Refill   HPI: Selena [name] is a 57 year old Caucasian female, employed, divorced, currently lives in [place], has a history of PTSD, GAD, insomnia, history of gastric bypass surgery in 2017, history of pituitary adenoma, migraine headaches, gastroesophageal reflux disease, history of abnormal LFT was evaluated by telemedicine today.  Patient today reports she is currently struggling with her work.  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  She reports she does not get a lot of break throughout the day.  She only has a 30-minute break the whole day.  She does not eat much throughout the day however tries to eat snacks when she can.  She does not get time to drink water often.  She has reports she may have lost some weight in the past several weeks.  She understands she needs more  time to learn and get adjusted to the new classroom.  She hence is not interested in making any medication changes.  She does have BuSpar 10 mg prescribed to take twice a day however she has been only taking it once a day and she believes that is helpful.  She will go up on the dosage if needed.  She is currently compliant on the fluoxetine.  Denies side effects.  Patient reports she gets tired at the end of the day and hence sleep has been good.  She does have trazodone and hydroxyzine available as needed.  Patient denies any suicidality, homicidality or perceptual disturbances.  Patient denies any other concerns today.  Visit Diagnosis:    ICD-10-CM   1. PTSD (post-traumatic stress disorder)  F43.10     2. GAD (generalized anxiety disorder)  F41.1     3. Insomnia due to medical condition  G47.01    Anxiety      Past Psychiatric History: I have reviewed past psychiatric history from progress note on 04/22/2022.  Past trials of Wellbutrin, Celexa, nortriptyline, Paxil, Effexor, Klonopin, Requip, trazodone.  Past Medical History:  Past Medical History:  Diagnosis Date   Anemia, iron deficiency    Anxiety    Asthma    Colon polyps 2014   Complication of anesthesia    per patient report , woke during endoscopy in 2014 ; no issues with subsequent colonscopy also  in 2014    Costochondritis    srturggled wiht it since age 34 ; exacerbates with excessive  movement of left arm; did phsycial therapy in 08-2017 and hasnt had issues with it since    Depression    Eating disorder    Binge eating   Fibromyalgia    Gallstones    GERD (gastroesophageal reflux disease)    Heart murmur    at birth; resolved    Hyperlipidemia    Hypertension    was due to birth control  , " as soon as they took it out, it went away  "     Past Surgical History:  Procedure Laterality Date   CHOLECYSTECTOMY     COLONOSCOPY     [facility] dr Arlyce Dice    COLONOSCOPY WITH PROPOFOL N/A 08/01/2021    Procedure: COLONOSCOPY WITH PROPOFOL;  Surgeon: Pasty Spillers, MD;  Location: [facility] ENDOSCOPY;  Service: Endoscopy;  Laterality: N/A;   EVALUATION UNDER ANESTHESIA WITH HEMORRHOIDECTOMY N/A 06/24/2018   Procedure: EXAM UNDER ANESTHESIA WITH EXTERNAL HEMORRHOIDECTOMY;  Surgeon: Luretha Murphy, MD;  Location: [facility];  Service: General;  Laterality: N/A;   EXPLORATORY LAPAROTOMY  2000   for infertility   GANGLION CYST EXCISION     GASTRIC ROUX-EN-Y N/A 01/24/2018   Procedure: LAPAROSCOPIC ROUX-EN-Y GASTRIC BYPASS WITH UPPER ENDOSCOPY;  Surgeon: Luretha Murphy, MD;  Location: [facility];  Service: General;  Laterality: N/A;   SPHINCTEROTOMY N/A 06/24/2018   Procedure: LATERAL INTERNAL SPHINCTEROTOMY;  Surgeon: Luretha Murphy, MD;  Location: [facility];  Service: General;  Laterality: N/A;   TONSILLECTOMY  1989    Family Psychiatric History: I have reviewed family psychiatric history from progress note on 04/22/2022.  Family History:  Family History  Problem Relation Age of Onset   Hyperlipidemia Mother    Heart disease Mother        Atrial fibrilation   Cancer Mother        ? Melanoma   Atrial fibrillation Mother    Depression Father    Bipolar disorder Father    Diabetes Father    Mental illness Father        Bipolar   Leukemia Father 54       MDS< then leukemia    Depression Sister    Cancer Sister        Clear cell sarcoma in leg   Hypothyroidism Sister    Arrhythmia Maternal Grandfather    Hypertension Maternal Grandmother    Heart disease Maternal Grandmother        Afib   Arrhythmia Maternal Grandmother    Arthritis Paternal Grandmother    ADD / ADHD Nephew    Anxiety disorder Nephew    ADD / ADHD Son    Autism spectrum disorder Son    Colon cancer Neg Hx    Rectal cancer Neg Hx    Pancreatic cancer Neg Hx     Social History: I have reviewed social history from progress note on 04/22/2022. Social History   Socioeconomic History   Marital status: Divorced    Spouse  name: Not on file   Number of children: 1   Years of education: Not on file   Highest education level: Master's degree (e.g., MA, MS, MEng, MEd, MSW, MBA)  Occupational History   Occupation: Teaches preschool    Employer: [employer]  Tobacco Use   Smoking status: Never    Passive exposure: Never   Smokeless tobacco: Never  Vaping Use   Vaping status: Never Used  Substance and Sexual Activity   Alcohol use: Not Currently    Comment:  occasional   Drug use: No   Sexual activity: Yes    Birth control/protection: I.U.D.  Other Topics Concern   Not on file  Social History Narrative   2 Step children; asthma, ADHD   Social Determinants of Health   Financial Resource Strain: Not on file  Food Insecurity: Not on file  Transportation Needs: Not on file  Physical Activity: Not on file  Stress: Not on file  Social Connections: Not on file    Allergies:  Allergies  Allergen Reactions   Fish Allergy Nausea And Vomiting   Augmentin [Amoxicillin-Pot Clavulanate] Diarrhea    Can take regular amoxicillin with no issues Has patient had a PCN reaction causing immediate rash, facial/tongue/throat swelling, SOB or lightheadedness with hypotension: No Has patient had a PCN reaction causing severe rash involving mucus membranes or skin necrosis: No Has patient had a PCN reaction that required hospitalization: No Has patient had a PCN reaction occurring within the last 10 years: Yes--but DIARRHEA ONLY If all of the above answers are "NO", then may proceed with Cephalosporin [place]   Citalopram Other (See Comments)    Tachycardia    Egg-Derived Products Diarrhea and Nausea And Vomiting   Requip [Ropinirole]     Not tolerated    Sulfonamide Derivatives Itching    Metabolic Disorder Labs: Lab Results  Component Value Date   HGBA1C 5.6 01/16/2022   Lab Results  Component Value Date   PROLACTIN 5.7 01/16/2022   PROLACTIN 7.5 09/05/2015   Lab Results  Component Value Date    CHOL 158 06/11/2021   TRIG 68.0 06/11/2021   HDL 58.30 06/11/2021   CHOLHDL 3 06/11/2021   VLDL 13.6 06/11/2021   LDLCALC 86 06/11/2021   LDLCALC 89 04/05/2019   Lab Results  Component Value Date   TSH 0.90 01/16/2022   TSH 0.95 12/10/2021    Therapeutic Level Labs: No results found for: "LITHIUM" No results found for: "VALPROATE" No results found for: "CBMZ"  Current Medications: Current Outpatient Medications  Medication Sig Dispense Refill   albuterol (VENTOLIN HFA) 108 (90 Base) MCG/ACT inhaler TAKE 2 PUFFS BY MOUTH EVERY 6 HOURS AS NEEDED FOR WHEEZE OR SHORTNESS OF BREATH 8.5 each 0   aspirin EC 81 MG tablet Take 1 tablet (81 mg total) by mouth in the morning and at bedtime. Swallow whole. 60 tablet 0   busPIRone (BUSPAR) 10 MG tablet Take 1 tablet (10 mg total) by mouth 2 (two) times daily. 180 tablet 0   calcium gluconate 500 MG tablet Take 1 tablet by mouth 2 (two) times daily.      chlorpheniramine-HYDROcodone (TUSSIONEX) 10-8 MG/5ML Take 5 mLs by mouth at bedtime as needed for cough. 115 mL 0   clonazePAM (KLONOPIN) 1 MG tablet Take 0.5-1 tablets (0.5-1 mg total) by mouth as directed. Take only for severe anxiety attacks , once daily as needed.Please limit use 15 tablet 0   Efinaconazole 10 % SOLN Apply 1 drop topically daily. 4 mL 11   estradiol (VIVELLE-DOT) 0.025 MG/24HR Place 1 patch onto the skin 2 (two) times a week.     FLUoxetine (PROZAC) 40 MG capsule TAKE 2 CAPSULES BY MOUTH DAILY 180 capsule 0   HYDROcodone-acetaminophen (NORCO/VICODIN) 5-325 MG tablet Take 1 tablet by mouth every 6 (six) hours as needed for moderate pain. 15 tablet 0   hydrOXYzine (ATARAX) 25 MG tablet Take 1-2 tablets (25-50 mg total) by mouth at bedtime as needed for anxiety. And sleep 180 tablet 0   ibuprofen (  ADVIL) 600 MG tablet Take 1 tablet (600 mg total) by mouth every 8 (eight) hours as needed. 30 tablet 0   Insulin Pen Needle (PEN NEEDLES) 31G X 6 MM MISC For use with victoza /saxenda  100 each 0   levonorgestrel (MIRENA) 20 MCG/24HR IUD 1 Intra Uterine Device (1 each total) by Intrauterine route once. 1 each 0   Multiple Vitamins-Iron (MULTI-VITAMIN/IRON PO) Take 1 tablet by mouth daily.      omeprazole (PRILOSEC OTC) 20 MG tablet Take 20 mg by mouth daily before breakfast.      ondansetron (ZOFRAN) 4 MG tablet Take 1 tablet (4 mg total) by mouth every 8 (eight) hours as needed for nausea or vomiting. 20 tablet 0   potassium chloride SA (KLOR-CON M) 20 MEQ tablet Take 1 tablet (20 mEq total) by mouth daily for 7 days. 7 tablet 0   pramipexole (MIRAPEX) 0.125 MG tablet Take 1 tablet (0.125 mg total) by mouth 3 (three) times daily. 90 tablet 2   rizatriptan (MAXALT) 10 MG tablet TAKE 1 TABLET BY MOUTH AS NEEDED FOR MIGRAINE. MAY REPEAT IN 2 HOURS IF NEEDED 10 tablet 2   topiramate (TOPAMAX) 100 MG tablet TAKE 1 TABLET BY MOUTH EVERY DAY 90 tablet 0   traZODone (DESYREL) 50 MG tablet TAKE 1/2 TO 1 TABLET BY MOUTH AT BEDTIME AS NEEDED FOR SLEEP 90 tablet 0   Vitamin D, Ergocalciferol, (DRISDOL) 1.25 MG (50000 UNIT) CAPS capsule TAKE 1 CAPSULE BY MOUTH ONE TIME PER WEEK 12 capsule 3   No current facility-administered medications for this visit.     Musculoskeletal: Strength & Muscle Tone:  UTA Gait & Station: normal Patient leans: N/A  Psychiatric Specialty Exam: Review of Systems  Psychiatric/Behavioral:  The patient is nervous/anxious.     There were no vitals taken for this visit.There is no height or weight on file to calculate BMI.  General Appearance: Fairly Groomed  Eye Contact:  Fair  Speech:  Clear and Coherent  Volume:  Normal  Mood:  Anxious  Affect:  Congruent  Thought Process:  Goal Directed and Descriptions of Associations: Intact  Orientation:  Full (Time, Place, and Person)  Thought Content: Logical   Suicidal Thoughts:  No  Homicidal Thoughts:  No  Memory:  Immediate;   Fair Recent;   Fair Remote;   Fair  Judgement:  Fair  Insight:  Fair   Psychomotor Activity:  Normal  Concentration:  Concentration: Fair and Attention Span: Fair  Recall:  [employer] of Knowledge: Fair  Language: Fair  Akathisia:  No  Handed:  Right  AIMS (if indicated): not done  Assets:  Communication Skills Desire for Improvement Housing Social Support  ADL's:  Intact  Cognition: WNL  Sleep:  Fair   Screenings: GAD-7    Garment/textile technologist Visit from 02/16/2023 in [facility] Office Visit from 11/16/2022 in [facility] Office Visit from 10/21/2022 in [facility] Visit from 08/24/2022 in [facility] Office Visit from 07/30/2022 in [facility]  Total GAD-7 Score 4 4 0 17 21      PHQ2-9    Flowsheet Row Office Visit from 02/16/2023 in [facility] Office Visit from 11/16/2022 in [facility] Office Visit from 10/21/2022 in [facility] Visit from 08/24/2022 in [facility] Office Visit from 07/30/2022 in [facility]  PHQ-2 Total Score 0 1 0 2 6  PHQ-9 Total Score 1 -- -- 5 22      Flowsheet Row Video Visit from 06/14/2023 in [facility] Office Visit from 02/16/2023 in [facility] Office Visit from 11/16/2022 in [facility]  C-SSRS RISK CATEGORY No Risk No Risk No Risk        Assessment and Plan: Selena [name] is a 57 year old Caucasian female, employed, divorced, has a history of PTSD, GAD, multiple medical problems was evaluated by telemedicine today.  Patient with work related stressors although current medications  are beneficial, will benefit from the following plan.  Plan PTSD-stable Continue CBT as needed Prozac 80 mg p.o. daily Trazodone 25-50 mg p.o. nightly as needed BuSpar prescribed as 10 mg p.o. twice daily her patient has been taking it only once a day.  Patient encouraged to increase the dosage if she has worsening anxiety.  GAD-stable Prozac 80 mg p.o. daily BuSpar 10 mg p.o. twice daily Hydroxyzine 25-50 mg p.o. nightly as needed Klonopin 0.5 mg as needed for severe anxiety attacks. Reviewed [place] PMP AWARxE   Insomnia-stable Patient to continue sleep hygiene techniques Trazodone 25-50 mg p.o. nightly for sleep-reduced dosage  Some time was spent providing brief supportive psychotherapy, scheduling breaks for herself, meal planning, taking snacks as well as staying well-hydrated throughout the day.   Consent: Patient/Guardian gives verbal consent for treatment and assignment of benefits for services provided during this visit. Patient/Guardian expressed understanding and agreed to proceed.  Follow-up in clinic in 2 months or sooner if needed.  This note was generated in part or whole with voice recognition software. Voice recognition is usually quite accurate but there are transcription errors that can and very often do occur. I apologize for any typographical errors that were not detected and corrected.     Jomarie Longs, MD 06/14/2023, 4:59 PM

## 2023-07-02 ENCOUNTER — Telehealth: Payer: Self-pay

## 2023-07-02 NOTE — Telephone Encounter (Signed)
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 she states that last time she spoke with dr. [name] she told her to call her back if she feels that medication not working. pt was last seen on 06-14-23 next appt 09-09-23

## 2023-07-05 ENCOUNTER — Encounter: Payer: Self-pay | Admitting: Psychiatry

## 2023-07-05 ENCOUNTER — Telehealth (INDEPENDENT_AMBULATORY_CARE_PROVIDER_SITE_OTHER): Payer: BC Managed Care – PPO | Admitting: Psychiatry

## 2023-07-05 DIAGNOSIS — F32A Depression, unspecified: Secondary | ICD-10-CM

## 2023-07-05 DIAGNOSIS — F411 Generalized anxiety disorder: Secondary | ICD-10-CM

## 2023-07-05 DIAGNOSIS — F4310 Post-traumatic stress disorder, unspecified: Secondary | ICD-10-CM | POA: Diagnosis not present

## 2023-07-05 DIAGNOSIS — G4701 Insomnia due to medical condition: Secondary | ICD-10-CM

## 2023-07-05 DIAGNOSIS — Z9189 Other specified personal risk factors, not elsewhere classified: Secondary | ICD-10-CM | POA: Insufficient documentation

## 2023-07-05 DIAGNOSIS — G4700 Insomnia, unspecified: Secondary | ICD-10-CM

## 2023-07-05 MED ORDER — QUETIAPINE FUMARATE 25 MG PO TABS
12.5000 mg | ORAL_TABLET | Freq: Every day | ORAL | 1 refills | Status: DC
Start: 2023-07-05 — End: 2023-07-07

## 2023-07-05 NOTE — Patient Instructions (Addendum)
Please call for EKG - [phone]   Quetiapine Tablets What is this medication? QUETIAPINE (kwe TYE a peen) treats schizophrenia and bipolar disorder. It works by balancing the levels of dopamine and serotonin in your brain, hormones that help regulate mood, behaviors, and thoughts. It belongs to a group of medications called antipsychotics. Antipsychotic medications can be used to treat several kinds of mental health conditions. This medicine may be used for other purposes; ask your health care provider or pharmacist if you have questions. COMMON BRAND NAME(S): Seroquel What should I tell my care team before I take this medication? They need to know if you have any of these conditions: Blockage in your bowels Cataracts Constipation Dementia Diabetes Difficulty swallowing Glaucoma Heart disease High levels of prolactin History of breast cancer History of irregular heartbeat Liver disease Low blood cell levels (white cells, red cells, and platelets) Low blood pressure Parkinson disease Prostate disease Seizures Suicidal thoughts, plans, or attempt by you or a family member Thyroid disease Trouble passing urine An unusual or allergic reaction to quetiapine, other medications, foods, dyes, or preservatives Pregnant or trying to get pregnant Breastfeeding How should I use this medication? Take this medication by mouth with water. Take it as directed on the prescription label at the same time every day. You can take it with or without food. If it upsets your stomach, take it with food. Keep taking it unless your care team tells you to stop. A special MedGuide will be given to you by the pharmacist with each prescription and refill. Be sure to read this information carefully each time. Talk to your care team about the use of this medication in children. While this medication may be prescribed for children as young as 10 years for selected conditions, precautions do apply. People over  11 years of age may have a stronger reaction to this medication and need smaller doses. Overdosage: If you think you have taken too much of this medicine contact a poison control center or emergency room at once. NOTE: This medicine is only for you. Do not share this medicine with others. What if I miss a dose? If you miss a dose, take it as soon as you can. If it is almost time for your next dose, take only that dose. Do not take double or extra doses. What may interact with this medication? Do not take this medication with any of the following: Cisapride Dronedarone Metoclopramide Pimozide Thioridazine This medication may also interact with the following: Alcohol Antihistamines for allergy, cough, and cold Atropine Avasimibe Certain antivirals for HIV or hepatitis Certain medications for anxiety or sleep Certain medications for bladder problems, such as oxybutynin, tolterodine Certain medications for depression, such as amitriptyline, fluoxetine, nefazodone, sertraline Certain medications for fungal infections, such as fluconazole, ketoconazole, itraconazole, posaconazole Certain medications for stomach problems, such as dicyclomine, hyoscyamine Certain medications for travel sickness, such as scopolamine Cimetidine General anesthetics, such as halothane, isoflurane, methoxyflurane, propofol Ipratropium Levodopa or other medications for Parkinson disease Medications for blood pressure Medications for seizures Medications that relax muscles for surgery Opioid medications for pain Other medications that cause heart rhythm changes Phenothiazines, such as chlorpromazine, prochlorperazine Rifampin St. Pratik's wort This list may not describe all possible interactions. Give your health care provider a list of all the medicines, herbs, non-prescription drugs, or dietary supplements you use. Also tell them if you smoke, drink alcohol, or use illegal drugs. Some items may interact with  your medicine. What should I watch for while  using this medication? Visit your care team for regular checks on your progress. Tell your care team if your symptoms do not start to get better or if they get worse. Do not suddenly stop taking This medication. You may develop a severe reaction. Your care team will tell you how much medication to take. If your care team wants you to stop the medication, the dose may be slowly lowered over time to avoid any side effects. You may need to have an eye exam before and during use of this medication. This medication may increase blood sugar. Ask your care team if changes in diet or medications are needed if you have diabetes. This medication may cause thoughts of suicide or depression. This includes sudden changes in mood, behaviors, or thoughts. These changes can happen at any time but are more common in the beginning of treatment or after a change in dose. Call your care team right away if you experience these thoughts or worsening depression. This medication may affect your coordination, reaction time, or judgment. Do not drive or operate machinery until you know how this medication affects you. Sit up or stand slowly to reduce the risk of dizzy or fainting spells. Drinking alcohol with this medication can increase the risk of these side effects. This medication can cause problems with controlling your body temperature. It can lower the response of your body to cold temperatures. If possible, stay indoors during cold weather. If you must go outdoors, wear warm clothes. It can also lower the response of your body to heat. Do not overheat. Do not over-exercise. Stay out of the sun when possible. If you must be in the sun, wear cool clothing. Drink plenty of water. If you have trouble controlling your body temperature, call your care team right away. What side effects may I notice from receiving this medication? Side effects that you should report to your care team as  soon as possible: Allergic reactions--skin rash, itching, hives, swelling of the face, lips, tongue, or throat Heart rhythm changes--fast or irregular heartbeat, dizziness, feeling faint or lightheaded, chest pain, trouble breathing High blood sugar (hyperglycemia)--increased thirst or amount of urine, unusual weakness or fatigue, blurry vision High fever, stiff muscles, increased sweating, fast or irregular heartbeat, and confusion, which may be signs of neuroleptic malignant syndrome High prolactin level--unexpected breast tissue growth, discharge from the nipple, change in sex drive or performance, irregular menstrual cycle Increase in blood pressure in children Infection--fever, chills, cough, or sore throat Low blood pressure--dizziness, feeling faint or lightheaded, blurry vision Low thyroid levels (hypothyroidism)--unusual weakness or fatigue, increased sensitivity to cold, constipation, hair loss, dry skin, weight gain, feelings of depression Pain or trouble swallowing Seizures Stroke--sudden numbness or weakness of the face, arm, or leg, trouble speaking, confusion, trouble walking, loss of balance or coordination, dizziness, severe headache, change in vision Sudden eye pain or change in vision such as blurry vision, seeing halos around lights, vision loss Thoughts of suicide or self-harm, worsening mood, feelings of depression Trouble passing urine Uncontrolled and repetitive body movements, muscle stiffness or spasms, tremors or shaking, loss of balance or coordination, restlessness, shuffling walk, which may be signs of extrapyramidal symptoms (EPS) Side effects that usually do not require medical attention (report to your care team if they continue or are bothersome): Constipation Dizziness Drowsiness Dry mouth Weight gain This list may not describe all possible side effects. Call your doctor for medical advice about side effects. You may report side effects to FDA at  1-800-FDA-1088. Where should I keep my medication? Keep out of the reach of children. Store at room temperature between 15 and 30 degrees C (59 and 86 degrees F). Throw away any unused medication after the expiration date. NOTE: This sheet is a summary. It may not cover all possible information. If you have questions about this medicine, talk to your doctor, pharmacist, or health care provider.  2024 Elsevier/Gold Standard (2022-04-06 00:00:00)

## 2023-07-05 NOTE — Telephone Encounter (Signed)
Patient has an appointment scheduled with this provider today, will evaluate.

## 2023-07-05 NOTE — Progress Notes (Signed)
Virtual Visit via Video Note  I connected with [name] on 07/05/23 at  4:30 PM EDT by a video enabled telemedicine application and verified that I am speaking with the correct person using two identifiers.  Location Provider Location : [facility] Patient Location : Home  Participants: Patient , Provider    I discussed the limitations of evaluation and management by telemedicine and the availability of in person appointments. The patient expressed understanding and agreed to proceed.   I discussed the assessment and treatment plan with the patient. The patient was provided an opportunity to ask questions and all were answered. The patient agreed with the plan and demonstrated an understanding of the instructions.   The patient was advised to call back or seek an in-person evaluation if the symptoms worsen or if the condition fails to improve as anticipated.   [facility] MD OP Progress Note  07/05/2023 5:09 PM Tytianna Greenley  MRN:  [id number]  Chief Complaint:  Chief Complaint  Patient presents with   Follow-up   Anxiety   Depression   Medication Refill   HPI: Annalisse Minkoff is a 57 year old Caucasian female, employed, divorced, currently lives in [place], has a history of PTSD, GAD, insomnia, history of gastric bypass surgery in 2017, history of pituitary adenoma, migraine headaches, gastroesophageal reflux disease, history of abnormal LFT was evaluated by telemedicine today.  Patient today reports she is currently having a hard time keeping up with her job related needs.  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  She reports there was a child who was acting out last week during lunch break and she was trying to discipline him.  Patient reports when he did not respond to her and did not follow through with her instructions she tried to discipline him and he started screaming for '70 minutes'.  Patient reports she felt bad since another staff who did not  know what was going on came and hugged the child who was throwing tantrums.  This was very upsetting to her since she felt she was the one who was wrong when the other staff behaved that way.  She also did not like the fact that the child got positive reinforcement for his behavior.  Patient hence is currently struggling with the fact that she may not be doing a good job with this class.  She feels like a failure and feels she did not meet the expectations of her school who hired her to do this job.  Patient hence has been struggling with sadness, low motivation, low energy, racing thoughts, sleep problems, decreased appetite, concentration problems.  Patient reports she started taking a higher dosage of BuSpar, 2 of the 10 mg since the past few days.  She does not know if that is helpful.  She reports Friday was a day off for school and that kind of helped her to relax.  Patient denies any suicidality, homicidality or perceptual disturbances.  Patient motivated to start psychotherapy, reports she will reach out to her therapist.  Patient denies any other concerns today.  Visit Diagnosis:    ICD-10-CM   1. PTSD (post-traumatic stress disorder)  F43.10 QUEtiapine (SEROQUEL) 25 MG tablet    2. GAD (generalized anxiety disorder)  F41.1 QUEtiapine (SEROQUEL) 25 MG tablet    3. Depression, unspecified depression type  F32.A QUEtiapine (SEROQUEL) 25 MG tablet    EKG 12-Lead    4. Insomnia due to medical condition  G47.01    Anxiety  5. At risk for prolonged QT interval syndrome  Z91.89 EKG 12-Lead      Past Psychiatric History: I have reviewed past psychiatric history from progress note on 04/22/2022.  Past trials of Wellbutrin, Celexa, nortriptyline, Paxil, Klonopin, Requip, trazodone.  Past Medical History:  Past Medical History:  Diagnosis Date   Anemia, iron deficiency    Anxiety    Asthma    Colon polyps 2014   Complication of anesthesia    per patient report , woke during  endoscopy in 2014 ; no issues with subsequent colonscopy also  in 2014    Costochondritis    srturggled wiht it since age 23 ; exacerbates with excessive movement of left arm; did phsycial therapy in 08-2017 and hasnt had issues with it since    Depression    Eating disorder    Binge eating   Fibromyalgia    Gallstones    GERD (gastroesophageal reflux disease)    Heart murmur    at birth; resolved    Hyperlipidemia    Hypertension    was due to birth control  , " as soon as they took it out, it went away  "     Past Surgical History:  Procedure Laterality Date   CHOLECYSTECTOMY     COLONOSCOPY     [facility] dr Arlyce Dice    COLONOSCOPY WITH PROPOFOL N/A 08/01/2021   Procedure: COLONOSCOPY WITH PROPOFOL;  Surgeon: Pasty Spillers, MD;  Location: [facility];  Service: Endoscopy;  Laterality: N/A;   EVALUATION UNDER ANESTHESIA WITH HEMORRHOIDECTOMY N/A 06/24/2018   Procedure: EXAM UNDER ANESTHESIA WITH EXTERNAL HEMORRHOIDECTOMY;  Surgeon: Luretha Murphy, MD;  Location: [facility];  Service: General;  Laterality: N/A;   EXPLORATORY LAPAROTOMY  2000   for infertility   GANGLION CYST EXCISION     GASTRIC ROUX-EN-Y N/A 01/24/2018   Procedure: LAPAROSCOPIC ROUX-EN-Y GASTRIC BYPASS WITH UPPER ENDOSCOPY;  Surgeon: Luretha Murphy, MD;  Location: [facility];  Service: General;  Laterality: N/A;   SPHINCTEROTOMY N/A 06/24/2018   Procedure: LATERAL INTERNAL SPHINCTEROTOMY;  Surgeon: Luretha Murphy, MD;  Location: [facility];  Service: General;  Laterality: N/A;   TONSILLECTOMY  1989    Family Psychiatric History: I have reviewed family psychiatric history from progress note on 04/22/2022.  Family History:  Family History  Problem Relation Age of Onset   Hyperlipidemia Mother    Heart disease Mother        Atrial fibrilation   Cancer Mother        ? Melanoma   Atrial fibrillation Mother    Depression Father    Bipolar disorder Father    Diabetes Father    Mental illness Father         Bipolar   Leukemia Father 28       MDS< then leukemia    Depression Sister    Cancer Sister        Clear cell sarcoma in leg   Hypothyroidism Sister    Arrhythmia Maternal Grandfather    Hypertension Maternal Grandmother    Heart disease Maternal Grandmother        Afib   Arrhythmia Maternal Grandmother    Arthritis Paternal Grandmother    ADD / ADHD Nephew    Anxiety disorder Nephew    ADD / ADHD Son    Autism spectrum disorder Son    Colon cancer Neg Hx    Rectal cancer Neg Hx    Pancreatic cancer Neg Hx     Social  History: I have reviewed social history from progress note on 04/22/2022. Social History   Socioeconomic History   Marital status: Divorced    Spouse name: Not on file   Number of children: 1   Years of education: Not on file   Highest education level: Master's degree (e.g., MA, MS, MEng, MEd, MSW, MBA)  Occupational History   Occupation: Teaches preschool    Employer: [employer]  Tobacco Use   Smoking status: Never    Passive exposure: Never   Smokeless tobacco: Never  Vaping Use   Vaping status: Never Used  Substance and Sexual Activity   Alcohol use: Not Currently    Comment: occasional   Drug use: No   Sexual activity: Yes    Birth control/protection: I.U.D.  Other Topics Concern   Not on file  Social History Narrative   2 Step children; asthma, ADHD   Social Determinants of Health   Financial Resource Strain: Not on file  Food Insecurity: Not on file  Transportation Needs: Not on file  Physical Activity: Not on file  Stress: Not on file  Social Connections: Not on file    Allergies:  Allergies  Allergen Reactions   Fish Allergy Nausea And Vomiting   Augmentin [Amoxicillin-Pot Clavulanate] Diarrhea    Can take regular amoxicillin with no issues Has patient had a PCN reaction causing immediate rash, facial/tongue/throat swelling, SOB or lightheadedness with hypotension: No Has patient had a PCN reaction causing severe rash  involving mucus membranes or skin necrosis: No Has patient had a PCN reaction that required hospitalization: No Has patient had a PCN reaction occurring within the last 10 years: Yes--but DIARRHEA ONLY If all of the above answers are "NO", then may proceed with Cephalosporin [place]   Citalopram Other (See Comments)    Tachycardia    Egg-Derived Products Diarrhea and Nausea And Vomiting   Requip [Ropinirole]     Not tolerated    Sulfonamide Derivatives Itching    Metabolic Disorder Labs: Lab Results  Component Value Date   HGBA1C 5.6 01/16/2022   Lab Results  Component Value Date   PROLACTIN 5.7 01/16/2022   PROLACTIN 7.5 09/05/2015   Lab Results  Component Value Date   CHOL 158 06/11/2021   TRIG 68.0 06/11/2021   HDL 58.30 06/11/2021   CHOLHDL 3 06/11/2021   VLDL 13.6 06/11/2021   LDLCALC 86 06/11/2021   LDLCALC 89 04/05/2019   Lab Results  Component Value Date   TSH 0.90 01/16/2022   TSH 0.95 12/10/2021    Therapeutic Level Labs: No results found for: "LITHIUM" No results found for: "VALPROATE" No results found for: "CBMZ"  Current Medications: Current Outpatient Medications  Medication Sig Dispense Refill   albuterol (VENTOLIN HFA) 108 (90 Base) MCG/ACT inhaler TAKE 2 PUFFS BY MOUTH EVERY 6 HOURS AS NEEDED FOR WHEEZE OR SHORTNESS OF BREATH 8.5 each 0   busPIRone (BUSPAR) 10 MG tablet Take 1 tablet (10 mg total) by mouth 2 (two) times daily. 180 tablet 0   calcium gluconate 500 MG tablet Take 1 tablet by mouth 2 (two) times daily.      clonazePAM (KLONOPIN) 1 MG tablet Take 0.5-1 tablets (0.5-1 mg total) by mouth as directed. Take only for severe anxiety attacks , once daily as needed.Please limit use 15 tablet 0   FLUoxetine (PROZAC) 40 MG capsule TAKE 2 CAPSULES BY MOUTH DAILY 180 capsule 0   hydrOXYzine (ATARAX) 25 MG tablet Take 1-2 tablets (25-50 mg total) by mouth at  bedtime as needed for anxiety. And sleep 180 tablet 0   ibuprofen (ADVIL) 600 MG tablet Take 1  tablet (600 mg total) by mouth every 8 (eight) hours as needed. 30 tablet 0   levonorgestrel (MIRENA) 20 MCG/24HR IUD 1 Intra Uterine Device (1 each total) by Intrauterine route once. 1 each 0   Multiple Vitamins-Iron (MULTI-VITAMIN/IRON PO) Take 1 tablet by mouth daily.      omeprazole (PRILOSEC OTC) 20 MG tablet Take 20 mg by mouth daily before breakfast.      QUEtiapine (SEROQUEL) 25 MG tablet Take 0.5-1 tablets (12.5-25 mg total) by mouth at bedtime. 30 tablet 1   rizatriptan (MAXALT) 10 MG tablet TAKE 1 TABLET BY MOUTH AS NEEDED FOR MIGRAINE. MAY REPEAT IN 2 HOURS IF NEEDED 10 tablet 2   topiramate (TOPAMAX) 100 MG tablet TAKE 1 TABLET BY MOUTH EVERY DAY 90 tablet 0   Vitamin D, Ergocalciferol, (DRISDOL) 1.25 MG (50000 UNIT) CAPS capsule TAKE 1 CAPSULE BY MOUTH ONE TIME PER WEEK 12 capsule 3   Insulin Pen Needle (PEN NEEDLES) 31G X 6 MM MISC For use with victoza /saxenda 100 each 0   traZODone (DESYREL) 50 MG tablet TAKE 1/2 TO 1 TABLET BY MOUTH AT BEDTIME AS NEEDED FOR SLEEP (Patient not taking: Reported on 07/05/2023) 90 tablet 0   No current facility-administered medications for this visit.     Musculoskeletal: Strength & Muscle Tone: within normal limits Gait & Station: normal Patient leans: N/A  Psychiatric Specialty Exam: Review of Systems  Psychiatric/Behavioral:  Positive for dysphoric mood and sleep disturbance. The patient is nervous/anxious.     There were no vitals taken for this visit.There is no height or weight on file to calculate BMI.  General Appearance: Fairly Groomed  Eye Contact:  Fair  Speech:  Clear and Coherent  Volume:  Normal  Mood:  Anxious and Dysphoric  Affect:  Congruent  Thought Process:  Goal Directed and Descriptions of Associations: Intact  Orientation:  Full (Time, Place, and Person)  Thought Content: Logical   Suicidal Thoughts:  No  Homicidal Thoughts:  No  Memory:  Immediate;   Fair Recent;   Fair Remote;   Fair  Judgement:  Fair   Insight:  Fair  Psychomotor Activity:  Normal  Concentration:  Concentration: Fair and Attention Span: Fair  Recall:  [employer] of Knowledge: Fair  Language: Fair  Akathisia:  No  Handed:  Right  AIMS (if indicated): not done  Assets:  Communication Skills Desire for Improvement Housing Social Support Talents/Skills  ADL's:  Intact  Cognition: WNL  Sleep:   varies   Screenings: GAD-7    Loss adjuster, chartered Office Visit from 02/16/2023 in [facility] Office Visit from 11/16/2022 in [facility] Office Visit from 10/21/2022 in [facility] Visit from 08/24/2022 in [facility] Office Visit from 07/30/2022 in [facility]  Total GAD-7 Score 4 4 0 17 21      PHQ2-9    Flowsheet Row Video Visit from 07/05/2023 in [facility] Office Visit from 02/16/2023 in [facility] Office Visit from 11/16/2022 in [facility] Office Visit from 10/21/2022 in [facility] Visit from 08/24/2022 in [facility]  PHQ-2 Total Score 2 0 1 0 2  PHQ-9 Total Score 12 1 -- -- 5      Flowsheet Row Video Visit from 07/05/2023 in [facility] Video Visit from 06/14/2023 in [facility] Office Visit from 02/16/2023 in [facility]  C-SSRS RISK CATEGORY No Risk No Risk No Risk        Assessment and Plan: Agatha Duplechain is a 57 year old Caucasian female, employed, divorced, has a history of PTSD, GAD, multiple medical problems was evaluated in office today.  Patient is currently struggling with  worsening anxiety, depression symptoms, will benefit from medication readjustment, reestablishing care with a therapist.  Plan as noted below.  Plan PTSD-stable Prozac 80 mg p.o. daily Trazodone 25-50 mg p.o. nightly as needed BuSpar 10 mg p.o. twice daily.  Patient has started taking the higher dosage recently.   GAD-unstable Prozac 80 mg p.o. daily BuSpar 10 mg p.o. twice daily Hydroxyzine 25-50 mg p.o. nightly as needed Klonopin 0.5 mg for severe anxiety attacks Patient advised to reestablish care with a therapist. Reviewed [place] PMP AWARxE  Depression unspecified-rule out MDD-unstable Start Seroquel 12.5-25 mg at bedtime. Provided medication education including weight gain side effects, abnormal involuntary movements, tardive dyskinesia. Continue Prozac 80 mg p.o. daily Patient to establish care with therapist.  Insomnia-unstable Patient to continue sleep hygiene techniques Patient to hold trazodone 25 to 50 mg p.o. nightly as needed since Seroquel added.  At risk for prolonged QT syndrome-we will order EKG.  Patient to call [phone].  Patient to get EKG completed in a week.        Collaboration of Care: Collaboration of Care: Referral or follow-up with counselor/therapist AEB patient encouraged to continue CBT.  Patient/Guardian was advised Release of Information must be obtained prior to any record release in order to collaborate their care with an outside provider. Patient/Guardian was advised if they have not already done so to contact the registration department to sign all necessary forms in order for [place] to release information regarding their care.   Consent: Patient/Guardian gives verbal consent for treatment and assignment of benefits for services provided during this visit. Patient/Guardian expressed understanding and agreed to proceed.   Follow-up in clinic in 2 to 3 weeks or sooner if needed.  This note was generated in part or whole with voice recognition  software. Voice recognition is usually quite accurate but there are transcription errors that can and very often do occur. I apologize for any typographical errors that were not detected and corrected.    Jomarie Longs, MD 07/05/2023, 5:09 PM

## 2023-07-07 ENCOUNTER — Telehealth: Payer: Self-pay

## 2023-07-07 DIAGNOSIS — F410 Panic disorder [episodic paroxysmal anxiety] without agoraphobia: Secondary | ICD-10-CM

## 2023-07-07 DIAGNOSIS — F411 Generalized anxiety disorder: Secondary | ICD-10-CM

## 2023-07-07 DIAGNOSIS — F4310 Post-traumatic stress disorder, unspecified: Secondary | ICD-10-CM

## 2023-07-07 MED ORDER — BUSPIRONE HCL 10 MG PO TABS
20.0000 mg | ORAL_TABLET | Freq: Two times a day (BID) | ORAL | 1 refills | Status: DC
Start: 2023-07-07 — End: 2023-09-08

## 2023-07-07 NOTE — Telephone Encounter (Signed)
Returned call to patient.  Discussed with patient to stop the Seroquel if she is having side effects.  Will increase BuSpar to 20 mg twice daily.

## 2023-07-07 NOTE — Telephone Encounter (Signed)
pt called states that she has some concerns and scared to take the new medication you order for her. the seroquel. she states that she was reading about this and it said it is to treat schizophrenia and bipolar and she thought you told her it was a mood stabilizer and she wanted to know why you would give her something for schizophrenia and bipolar.  She scared to take it.  Please call her,

## 2023-07-07 NOTE — Telephone Encounter (Signed)
pt called states that with the new medication she only took 1/2 and she is having a racing heart rate. wanted to know if that was normal or if she needs to stop medication.

## 2023-07-08 ENCOUNTER — Ambulatory Visit (INDEPENDENT_AMBULATORY_CARE_PROVIDER_SITE_OTHER): Payer: BC Managed Care – PPO | Admitting: Podiatry

## 2023-07-08 ENCOUNTER — Ambulatory Visit (INDEPENDENT_AMBULATORY_CARE_PROVIDER_SITE_OTHER): Payer: BC Managed Care – PPO

## 2023-07-08 DIAGNOSIS — Z9889 Other specified postprocedural states: Secondary | ICD-10-CM | POA: Diagnosis not present

## 2023-07-08 DIAGNOSIS — M21612 Bunion of left foot: Secondary | ICD-10-CM

## 2023-07-08 NOTE — Progress Notes (Signed)
Subjective:  Patient ID: Selena [name], female    DOB: [birth year],  MRN: [id number]  Chief Complaint  Patient presents with   Routine Post Op    POV # 5 DOS 04/14/23 --- LEFT FOOT BUNIONECTOMY, POSSIBLE TAILORS BUNIONECTOMY    DOS: 04/14/2023 Procedure: Left foot minimally invasive bunionectomy with distal first metatarsal osteotomy and Akin osteotomy  57 y.o. female returns for post-op check.  Patient reports for postop check approximately 11 week status post left foot bunionectomy with minimally invasive technique.  Has been weightbearing in regular shoes and has been back to work.  She states she is doing a lot better at this time with minimal to no pain when she is walking she does have a little soreness in the left forefoot at the end of the day after she has been on her foot all day.   Review of Systems: Negative except as noted in the HPI. Denies N/V/F/Ch.   Objective:   There were no vitals filed for this visit.  There is no height or weight on file to calculate BMI. Constitutional Well developed. Well nourished.  Vascular Foot warm and well perfused. Capillary refill normal to all digits.  Calf is soft and supple, no posterior calf or knee pain, negative Homans' sign  Neurologic Normal speech. Oriented to person, place, and time. Epicritic sensation to light touch grossly present bilaterally.  Dermatologic Skin healing well without signs of infection. Skin edges well coapted without signs of infection.  Orthopedic: No significant tenderness or edema noted about the first MPJ on the left foot.  Good dorsiflexion range of motion however plantarflexion range of motion is diminished at this time   Multiple view plain film radiographs: 07/08/2023 XR 3 views AP lateral bleak of the left foot weightbearing.  Findings: Attention directed the first ray there is noted to be bunionectomy with distal first metatarsal osteotomy with lateral translation of the first metatarsal head.   Surgical screws are present without any hardware breakage.  There is improved osseous healing both in the hallux as well as the first metatarsal with complete osseous bridging across the osteotomy site much improved from prior. Assessment:   1. Post-operative state   2. Bunion, left foot      Plan:  Patient was evaluated and treated and all questions answered.  S/p foot surgery left foot Bunionectomy and Akin osteotomy -Progressing as expected post-operatively.  Nearly completely healed after left foot minimally invasive bunionectomy and Akin osteotomy -Patient much improved from prior decreased swelling about the first MPJ -Continue in regular shoe gear -XR: As above improved imaging from prior with increased osseous healing -WB Status: Weightbearing as tolerated in regular shoe gear    -Medications: Ibuprofen as needed for pain however patient states she is not having much pain just a little soreness at the end of the day -Continue range of motion exercises for the first MPJ -Overall patient is maximally improved following left foot bunion surgery discussed that if she wants to proceed with custom-made orthotics would be an option for her to try and work on the end of day soreness after she has been on the foot all day however I believe she is fully healed in regards to her bunion correction at this time.  She is satisfied with the result and feels the correction is adequate discharged in regards to the surgical procedure she is to call if there are any future questions or concerns regarding the surgery.        Alex  Kyra Searles, DPM [facility] / [facility]

## 2023-07-23 LAB — HM MAMMOGRAPHY

## 2023-08-02 ENCOUNTER — Other Ambulatory Visit: Payer: Self-pay | Admitting: Psychiatry

## 2023-08-02 DIAGNOSIS — F4310 Post-traumatic stress disorder, unspecified: Secondary | ICD-10-CM

## 2023-08-04 ENCOUNTER — Ambulatory Visit: Payer: BC Managed Care – PPO | Admitting: Psychiatry

## 2023-08-04 ENCOUNTER — Encounter: Payer: Self-pay | Admitting: Psychiatry

## 2023-08-04 ENCOUNTER — Telehealth (HOSPITAL_COMMUNITY): Payer: Self-pay | Admitting: Psychiatry

## 2023-08-04 VITALS — BP 119/81 | HR 71 | Temp 98.1°F | Ht 62.0 in | Wt 136.6 lb

## 2023-08-04 DIAGNOSIS — F4310 Post-traumatic stress disorder, unspecified: Secondary | ICD-10-CM | POA: Diagnosis not present

## 2023-08-04 DIAGNOSIS — G4701 Insomnia due to medical condition: Secondary | ICD-10-CM

## 2023-08-04 DIAGNOSIS — F330 Major depressive disorder, recurrent, mild: Secondary | ICD-10-CM

## 2023-08-04 DIAGNOSIS — F411 Generalized anxiety disorder: Secondary | ICD-10-CM | POA: Diagnosis not present

## 2023-08-04 MED ORDER — MIRTAZAPINE 7.5 MG PO TABS: 7.5000 mg | ORAL_TABLET | Freq: Every day | ORAL | 1 refills | Status: DC

## 2023-08-04 MED ORDER — CLONAZEPAM 1 MG PO TABS
0.5000 mg | ORAL_TABLET | ORAL | 0 refills | Status: DC
Start: 2023-08-04 — End: 2023-09-09

## 2023-08-04 NOTE — Telephone Encounter (Signed)
D:  Dr. [name] referred pt to virtual MH-IOP.  A:  Placed call to orient pt, but there was no answer.  Case manager left her name and phone #, but will f/u with pt in the morning.  Informed Dr. [name].

## 2023-08-04 NOTE — Patient Instructions (Signed)
Mirtazapine Tablets What is this medication? MIRTAZAPINE (mir TAZ a peen) treats depression. It increases the amount of serotonin and norepinephrine in the brain, hormones that help regulate mood. This medicine may be used for other purposes; ask your health care provider or pharmacist if you have questions. COMMON BRAND NAME(S): Remeron What should I tell my care team before I take this medication? They need to know if you have any of these conditions: Bipolar disorder Glaucoma Kidney disease Liver disease Suicidal thoughts An unusual or allergic reaction to mirtazapine, other medications, foods, dyes, or preservatives Pregnant or trying to get pregnant Breast-feeding How should I use this medication? Take this medication by mouth with a glass of water. Follow the directions on the prescription label. Take your medication at regular intervals. Do not take your medication more often than directed. Do not stop taking this medication suddenly except upon the advice of your care team. Stopping this medication too quickly may cause serious side effects or your condition may worsen. A special MedGuide will be given to you by the pharmacist with each prescription and refill. Be sure to read this information carefully each time. Talk to your care team about the use of this medication in children. Special care may be needed. Overdosage: If you think you have taken too much of this medicine contact a poison control center or emergency room at once. NOTE: This medicine is only for you. Do not share this medicine with others. What if I miss a dose? If you miss a dose, take it as soon as you can. If it is almost time for your next dose, take only that dose. Do not take double or extra doses. What may interact with this medication? Do not take this medication with any of the following: Linezolid MAOIs, such as Carbex, Eldepryl, Marplan, Nardil, and Parnate Methylene blue (injected into a vein) This  medication may also interact with the following: Alcohol Antivirals for HIV or AIDS Certain medications that treat or prevent blood clots, such as warfarin Certain medications for fungal infections, such as ketoconazole and itraconazole Certain medications for mental health conditions Certain medications for migraine headache, such as almotriptan, eletriptan, frovatriptan, naratriptan, rizatriptan, sumatriptan, zolmitriptan Certain medications for seizures, such as carbamazepine or phenytoin Certain medications for sleep Cimetidine Erythromycin Fentanyl Lithium Medications for blood pressure Nefazodone Rasagiline Rifampin Supplements, such as St. John's wort, kava kava, valerian Tramadol Tryptophan This list may not describe all possible interactions. Give your health care provider a list of all the medicines, herbs, non-prescription drugs, or dietary supplements you use. Also tell them if you smoke, drink alcohol, or use illegal drugs. Some items may interact with your medicine. What should I watch for while using this medication? Visit your care team for regular checks on your progress. It may be some time before you see the benefit from this medication. This medication may cause thoughts of suicide or depression. This includes sudden changes in mood, behaviors, or thoughts. These changes can happen at any time but are more common in the beginning of treatment or after a change in dose. Call your care team right away if you experience these thoughts or worsening depression. This medication may affect your coordination, reaction time, or judgment. Do not drive or operate machinery until you know how this medication affects you. Sit up or stand slowly to reduce the risk of dizzy or fainting spells. Drinking alcohol with this medication can increase the risk of these side effects. This medication may cause dry  eyes and blurred vision. If you wear contact lenses, you may feel some discomfort.  Lubricating eye drops may help. See your care team if the problem does not go away or is severe. Your mouth may get dry. Chewing sugarless gum or sucking hard candy and drinking plenty of water may help. Contact your care team if the problem does not go away or is severe. What side effects may I notice from receiving this medication? Side effects that you should report to your care team as soon as possible: Allergic reactions--skin rash, itching, hives, swelling of the face, lips, tongue, or throat Heart rhythm changes--fast or irregular heartbeat, dizziness, feeling faint or lightheaded, chest pain, trouble breathing Infection--fever, chills, cough, or sore throat Irritability, confusion, fast or irregular heartbeat, muscle stiffness, twitching muscles, sweating, high fever, seizure, chills, vomiting, diarrhea, which may be signs of serotonin syndrome Low sodium level--muscle weakness, fatigue, dizziness, headache, confusion Rash, fever, and swollen lymph nodes Redness, blistering, peeling or loosening of the skin, including inside the mouth Seizures Sudden eye pain or change in vision such as blurry vision, seeing halos around lights, vision loss Thoughts of suicide or self-harm, worsening mood, feelings of depression Side effects that usually do not require medical attention (report to your care team if they continue or are bothersome): Constipation Dizziness Drowsiness Dry mouth Increase in appetite Weight gain This list may not describe all possible side effects. Call your doctor for medical advice about side effects. You may report side effects to FDA at 1-800-FDA-1088. Where should I keep my medication? Keep out of the reach of children. Store at room temperature between 15 and 30 degrees C (59 and 86 degrees F) Protect from light and moisture. Throw away any unused medication after the expiration date. NOTE: This sheet is a summary. It may not cover all possible information. If you  have questions about this medicine, talk to your doctor, pharmacist, or health care provider.  2024 [employer] Standard (2022-05-20 00:00:00)
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
## 2023-08-05 ENCOUNTER — Telehealth (HOSPITAL_COMMUNITY): Payer: Self-pay | Admitting: Psychiatry

## 2023-08-05 NOTE — Telephone Encounter (Signed)
D:  Returned phone call to orient pt to virtual MH-IOP.  Informed pt that she would need a CCA done before she could start.  According to pt she is available in the afternoon on 08-10-23 @ 2pm.  Pt requested the CCA to be done on 08-09-23, but the case manager is on PAL that day.  A:  CCA scheduled for 08-10-23 @ 2 pm (virtual) and pt will start MH-IOP on 08-11-23 @ 9 a.m.  Inform Dr. [name].  R:  Pt receptive.

## 2023-08-10 ENCOUNTER — Telehealth: Payer: Self-pay | Admitting: Psychiatry

## 2023-08-10 ENCOUNTER — Other Ambulatory Visit (HOSPITAL_COMMUNITY): Payer: Self-pay | Attending: Psychiatry | Admitting: Psychiatry

## 2023-08-10 DIAGNOSIS — Z1330 Encounter for screening examination for mental health and behavioral disorders, unspecified: Secondary | ICD-10-CM

## 2023-08-10 NOTE — Telephone Encounter (Signed)
Patient has a scheduled appointment with MH IOP today with Ms.Clark. Please let patient know they will fill out FMLA and they can add today when they fill it out . If not she could ask her company to send the request to [place] and we can fill it out.
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
## 2023-08-10 NOTE — Telephone Encounter (Signed)
Patient states she only took the Remeron for a couple of days and had to stop. Felt really jumpy while on it. She states she doesn't know what to do with going to work tomorrow. She couldn't bring herself to go today and there are no children there. She fells in a panic. Please call to advise

## 2023-08-10 NOTE — Telephone Encounter (Signed)
Pt was notified.  

## 2023-08-11 ENCOUNTER — Encounter (HOSPITAL_COMMUNITY): Payer: Self-pay

## 2023-08-11 ENCOUNTER — Other Ambulatory Visit (HOSPITAL_COMMUNITY): Payer: BC Managed Care – PPO | Attending: Psychiatry | Admitting: Psychiatry

## 2023-08-11 ENCOUNTER — Encounter (HOSPITAL_COMMUNITY): Payer: Self-pay | Admitting: Psychiatry

## 2023-08-11 DIAGNOSIS — Z79899 Other long term (current) drug therapy: Secondary | ICD-10-CM | POA: Insufficient documentation

## 2023-08-11 DIAGNOSIS — F330 Major depressive disorder, recurrent, mild: Secondary | ICD-10-CM | POA: Insufficient documentation

## 2023-08-11 DIAGNOSIS — F4310 Post-traumatic stress disorder, unspecified: Secondary | ICD-10-CM | POA: Insufficient documentation
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
## 2023-08-12 ENCOUNTER — Other Ambulatory Visit (HOSPITAL_COMMUNITY): Payer: BC Managed Care – PPO | Attending: Psychiatry | Admitting: Psychiatry

## 2023-08-12 DIAGNOSIS — F4310 Post-traumatic stress disorder, unspecified: Secondary | ICD-10-CM

## 2023-08-12 DIAGNOSIS — F411 Generalized anxiety disorder: Secondary | ICD-10-CM | POA: Diagnosis not present

## 2023-08-12 DIAGNOSIS — F330 Major depressive disorder, recurrent, mild: Secondary | ICD-10-CM

## 2023-08-12 NOTE — Progress Notes (Signed)
Virtual Visit via Video Note   I connected with Selena [name] on 08/12/23 at  9:00 AM EDT by a video enabled telemedicine application and verified that I am speaking with the correct person using two identifiers.   At orientation to the IOP program, Case Manager discussed the limitations of evaluation and management by telemedicine and the availability of in person appointments. The patient expressed understanding and agreed to proceed with virtual visits throughout the duration of the program.   Location:  Patient: Patient Home Provider: OPT BH Office   History of Present Illness: PTSD, MDD, and GAD   Observations/Objective: Check In: Case Manager checked in with all participants to review discharge dates, insurance authorizations, work-related documents and needs from the treatment team regarding medications. Selena [name] stated needs and engaged in discussion.    Initial Therapeutic Activity: Counselor facilitated a check-in with Selena [name] to assess for safety, sobriety and medication compliance.  Counselor also inquired about Selena [name]'s current emotional ratings, as well as any significant changes in thoughts, feelings or behavior since previous check in.  Selena [name] presented for session on time and was alert, oriented x5, with no evidence or self-report of active SI/HI or A/V H.  Selena [name] reported compliance with medication and denied use of alcohol or illicit substances.  Selena [name] reported scores of 6/10 for depression, 8/10 for anxiety, and 0/10 for anger/irritability.  Selena [name] denied any recent outbursts or panic attacks.  Selena [name] reported that a recent success was having a nice lunch with her sister yesterday.  Selena [name] reported that a struggle was having a conversation with a coworker later in the evening which was upsetting for her.  Selena [name] reported that her goal today is to bake something for self-care.    Second Therapeutic Activity: Counselor introduced topic of stress  management today.  Counselor provided definition of stress as feeling tense, overwhelmed, worn out, and/or exhausted, and noted that in small amounts, stress can be motivating until things become too overwhelming to manage.  Counselor also explained how stress can be acute (brief but intense) or chronic (long-lasting) and this can impact the severity of symptoms one can experience in the physical, emotional, and behavioral categories.  Counselor inquired about members' specific stressors, how long they have been prevalent, and the various symptoms that tend to manifest as a result.  Counselor also offered several stress management strategies to help improve members' coping ability, including journaling, gratitude practice, relaxation techniques, and time management tips.  Counselor also explained that research has shown a strong support network composed of trusted family, friends, or community members can increase resilience in times of stress, and inquired about who members can reach out to for help in managing stressors.  Counselor encouraged members to consider discussing stressor 'red flags' with their close supports that can be monitored and strategies for assisting them in times of crisis.  Intervention was effective, as evidenced by Selena [name] actively participating in discussion on subject, reporting that her most significant stressors include work stress, lack of confidence, changing jobs, and planning for retirement.  Selena [name] was able to identify several warning signs related to stress, including chest pain or tightness, teeth grinding, forgetfulness, and compulsive eating.  Selena [name] reported that her stress management goal is to keep her stress level below 5/10 for 4 days a week, and at 0/10 for 3 days a week by making changes to her work life balance, and possibly looking into other career opportunities that might be less triggering for her PTSD.  Selena [name] also expressed receptiveness to  several  stress management strategies practiced today in session, including practicing a deep breathing exercise, keeping a stress journal to track stressors, and progressive muscle relaxation.     Assessment and Plan: Counselor recommends that Selena [name] remain in IOP treatment to better manage mental health symptoms, ensure stability and pursue completion of treatment plan goals. Counselor recommends adherence to crisis/safety plan, taking medications as prescribed, and following up with medical professionals if any issues arise.    Follow Up Instructions: Counselor will send Webex link for session tomorrow.  Selena [name] was advised to call back or seek an in-person evaluation if the symptoms worsen or if the condition fails to improve as anticipated.   Collaboration of Care:   Medication Management AEB Dr. Alfonse Flavors or Hillery Jacks, NP                                          Case Manager AEB [name], CNA    Patient/Guardian was advised Release of Information must be obtained prior to any record release in order to collaborate their care with an outside provider. Patient/Guardian was advised if they have not already done so to contact the registration department to sign all necessary forms in order for [place] to release information regarding their care.    Consent: Patient/Guardian gives verbal consent for treatment and assignment of benefits for services provided during this visit. Patient/Guardian expressed understanding and agreed to proceed.   I provided 180 minutes of non-face-to-face time during this encounter.   Selena [name], [place], LCAS 08/12/23

## 2023-08-13 ENCOUNTER — Other Ambulatory Visit (HOSPITAL_COMMUNITY): Payer: BC Managed Care – PPO | Admitting: Psychiatry

## 2023-08-13 DIAGNOSIS — F4310 Post-traumatic stress disorder, unspecified: Secondary | ICD-10-CM

## 2023-08-13 DIAGNOSIS — F411 Generalized anxiety disorder: Secondary | ICD-10-CM

## 2023-08-13 DIAGNOSIS — F330 Major depressive disorder, recurrent, mild: Secondary | ICD-10-CM

## 2023-08-13 NOTE — Progress Notes (Signed)
Virtual Visit via Video Note   I connected with Selena [name] on 08/13/23 at  9:00 AM EDT by a video enabled telemedicine application and verified that I am speaking with the correct person using two identifiers.   At orientation to the IOP program, Case Manager discussed the limitations of evaluation and management by telemedicine and the availability of in person appointments. The patient expressed understanding and agreed to proceed with virtual visits throughout the duration of the program.   Location:  Patient: Patient Home Provider: Home Office   History of Present Illness: PTSD, MDD, and GAD   Observations/Objective: Check In: Case Manager checked in with all participants to review discharge dates, insurance authorizations, work-related documents and needs from the treatment team regarding medications. Selena [name] stated needs and engaged in discussion.    Initial Therapeutic Activity: Counselor facilitated a check-in with Selena [name] to assess for safety, sobriety and medication compliance.  Counselor also inquired about Selena [name]'s current emotional ratings, as well as any significant changes in thoughts, feelings or behavior since previous check in.  Selena [name] presented for session on time and was alert, oriented x5, with no evidence or self-report of active SI/HI or A/V H.  Selena [name] reported compliance with medication and denied use of alcohol or illicit substances.  Selena [name] reported scores of 6/10 for depression, 6/10 for anxiety, and 0/10 for anger/irritability.  Selena [name] denied any recent outbursts or panic attacks.  Selena [name] reported that a recent success was outreaching a Animator to assertively address an issue that came up between them, which they were receptive about.  Selena [name] reported that a struggle was finding it difficult to fall asleep last night.  Selena [name] reported that her goal is to meet up with family about preparing for her boyfriend's birthday tomorrow.         Second Therapeutic Activity: Counselor introduced topic of self-care today.  Counselor explained how this can be defined as the things one does to maintain good health and improve well-being.  Counselor provided members with a self-care assessment form to complete.  This handout featured various sub-categories of self-care, including physical, psychological/emotional, social, spiritual, and professional.  Members were asked to rank their engagement in the activities listed for each dimension on a scale of 1-3, with 1 indicating 'Poor', 2 indicating 'Ok', and 3 indicating 'Well'.  Counselor invited members to share results of their assessment, and inquired about which areas of self-care they are doing well in, as well as areas that require attention, and how they plan to begin addressing this during treatment.  Intervention was effective, as evidenced by Selena [name] successfully completing initial 2 sections of assessment and actively engaging in discussion on subject, reporting that she is excelling in areas such as maintaining personal hygiene, attending preventative medical appointments, and eating healthy foods, but would benefit from focusing more on areas such as getting enough sleep, resting when sick, wearing clothes that make her feel good, exercising, participating in fun activities or hobbies, doing comforting things, going on vacations or daytrips, and finding reasons to laugh.  Selena [name] reported that she would work to improve self-care deficits by avoiding naps throughout the day that could disrupt routine, wearing some new outfits that improve self-image, getting back into a light exercise routine as she recovers from surgery, setting aside time outside of work for simple activities like watching TV, attending craft fairs with family, or taking relaxing baths, planning for a meaningful vacation somewhere like [place], or talking to supportive friends that can make her laugh.  Assessment and  Plan: Counselor recommends that Selena [name] remain in IOP treatment to better manage mental health symptoms, ensure stability and pursue completion of treatment plan goals. Counselor recommends adherence to crisis/safety plan, taking medications as prescribed, and following up with medical professionals if any issues arise.    Follow Up Instructions: Counselor will send Webex link for session tomorrow.  Selena [name] was advised to call back or seek an in-person evaluation if the symptoms worsen or if the condition fails to improve as anticipated.   Collaboration of Care:   Medication Management AEB Dr. Alfonse Flavors or Hillery Jacks, NP                                          Case Manager AEB [name], CNA    Patient/Guardian was advised Release of Information must be obtained prior to any record release in order to collaborate their care with an outside provider. Patient/Guardian was advised if they have not already done so to contact the registration department to sign all necessary forms in order for [place] to release information regarding their care.    Consent: Patient/Guardian gives verbal consent for treatment and assignment of benefits for services provided during this visit. Patient/Guardian expressed understanding and agreed to proceed.   I provided 180 minutes of non-face-to-face time during this encounter.   Noralee Stain, [place], LCAS 08/13/23

## 2023-08-16 ENCOUNTER — Other Ambulatory Visit (HOSPITAL_COMMUNITY): Payer: BC Managed Care – PPO | Admitting: Psychiatry

## 2023-08-16 DIAGNOSIS — F411 Generalized anxiety disorder: Secondary | ICD-10-CM

## 2023-08-16 DIAGNOSIS — F4310 Post-traumatic stress disorder, unspecified: Secondary | ICD-10-CM

## 2023-08-16 DIAGNOSIS — F330 Major depressive disorder, recurrent, mild: Secondary | ICD-10-CM | POA: Diagnosis not present

## 2023-08-16 NOTE — Progress Notes (Signed)
Virtual Visit via Video Note   I connected with [name] on 08/16/23 at  9:00 AM EDT by a video enabled telemedicine application and verified that I am speaking with the correct person using two identifiers.   At orientation to the IOP program, Case Manager discussed the limitations of evaluation and management by telemedicine and the availability of in person appointments. The patient expressed understanding and agreed to proceed with virtual visits throughout the duration of the program.   Location:  Patient: Patient Home Provider: Home Office   History of Present Illness: MDD, GAD and PTSD   Observations/Objective: Check In: Case Manager checked in with all participants to review discharge dates, insurance authorizations, work-related documents and needs from the treatment team regarding medications. Torance stated needs and engaged in discussion.    Initial Therapeutic Activity: Counselor facilitated a check-in with Johnasia to assess for safety, sobriety and medication compliance.  Counselor also inquired about Katiana's current emotional ratings, as well as any significant changes in thoughts, feelings or behavior since previous check in.  Jayce presented for session on time and was alert, oriented x5, with no evidence or self-report of active SI/HI or A/V H.  Stevi reported compliance with medication and denied use of alcohol or illicit substances.  Zaelynn reported scores of 4/10 for depression, 4/10 for anxiety, and 0/10 for anger/irritability.  Aniiyah denied any recent outbursts or panic attacks.  Haarika reported that a recent success was baking a cake for her husband's birthday celebration with family.  Cushena reported that a struggle is experiencing anxiety this week worrying about how to fill her empty schedule.   Kenitra reported that her goal today is to go to the eye doctor to pick up new glasses.          Second Therapeutic Activity: Counselor  covered topic of core beliefs with group today.  Counselor virtually shared a handout on the subject, which explained how everyone looks at the world differently, and two people can have the same experience, but have different interpretations of what happened.  Members were encouraged to think of these like sunglasses with different "shades" influencing perception towards positive or negative outcomes.  Examples of negative core beliefs were provided, such as "I'm unlovable", "I'm not good enough", and "I'm a bad person".  Members were asked to share which one(s) they could relate to, and then identify evidence which contradicts these beliefs.  Counselor also provided psychoeducation on positive affirmations today.  Counselor explained how these are positive statements which can be spoken out loud or recited mentally to challenge negative thoughts and/or core beliefs to improve mood and outlook each day.  Counselor shared a comprehensive list of affirmations virtually to members with different categories, including ones for health, confidence, success, and happiness.  Counselor invited members to look through this list and identify any which resonated with them, and practice saying them out loud with sincerity.  Intervention was effective, as evidenced by [name] successfully participating in discussion on the subject and reporting that she could relate to several negative core beliefs listed on the handout, such as "I am trapped", and "I am bad".  Elisavet was able to successfully challenge the core belief "I am trapped" by listing evidence that contradicted it, reporting that she has a lot of experience in her field that would make her appealing to other employers, she is currently on medical leave, and putting in applications.  Elisheba also reported that she liked several of the positive affirmations listed,  such as "I get better every day in every way", "I am not trying to fit in, because I was born to  stand out", and "All of my problems have solutions".      Assessment and Plan: Counselor recommends that [place] remain in IOP treatment to better manage mental health symptoms, ensure stability and pursue completion of treatment plan goals. Counselor recommends adherence to crisis/safety plan, taking medications as prescribed, and following up with medical professionals if any issues arise.    Follow Up Instructions: Counselor will send Webex link for session tomorrow.  Keslie was advised to call back or seek an in-person evaluation if the symptoms worsen or if the condition fails to improve as anticipated.   Collaboration of Care:   Medication Management AEB Dr. Alfonse Flavors or Hillery Jacks, NP                                          Case Manager AEB [name], CNA    Patient/Guardian was advised Release of Information must be obtained prior to any record release in order to collaborate their care with an outside provider. Patient/Guardian was advised if they have not already done so to contact the registration department to sign all necessary forms in order for [place] to release information regarding their care.    Consent: Patient/Guardian gives verbal consent for treatment and assignment of benefits for services provided during this visit. Patient/Guardian expressed understanding and agreed to proceed.   I provided 180 minutes of non-face-to-face time during this encounter.   Noralee Stain, [place], LCAS 08/16/23

## 2023-08-17 ENCOUNTER — Encounter (HOSPITAL_COMMUNITY): Payer: Self-pay | Admitting: Family

## 2023-08-17 ENCOUNTER — Other Ambulatory Visit (HOSPITAL_COMMUNITY): Payer: BC Managed Care – PPO | Admitting: Family

## 2023-08-17 DIAGNOSIS — F330 Major depressive disorder, recurrent, mild: Secondary | ICD-10-CM | POA: Diagnosis not present

## 2023-08-17 DIAGNOSIS — F4310 Post-traumatic stress disorder, unspecified: Secondary | ICD-10-CM

## 2023-08-17 DIAGNOSIS — F411 Generalized anxiety disorder: Secondary | ICD-10-CM

## 2023-08-17 NOTE — Progress Notes (Signed)
BH MD/PA/NP OP Progress Note  08/17/2023 12:53 PM Selena [name]  MRN:  [id number]  Chief Complaint:  [name] "poor concentration."  HPI: Selena [name] was seen and evaluated via video telehealth assessment.  She presents flat, guarded but pleasant.  Denying suicidal or homicidal ideations.  Denies auditory or visual hallucinations.  States she just wanted to speak to someone regarding her medications at the suggestion of the facilitator.    States she continues to feel like she has decreased to no energy coupled with little motivation to clean her home.  States she has been struggling with the symptoms for over 2 years.  Reports overall, her mood has improved has she was struggling with debilitating depression.  States " I recently got out and I know that have to let my medications stabilize."  Discussed adding adjunct medications such as venlafaxine and/or Wellbutrin.  Patient declined at this time.  States she will give it a few more weeks.  Discussed consideration for adult ADHD testing through [facility]. " I don't think my lack of motivation is due to any other diagnoses."   Patient to continue with current medication regimen.   Follow-up as needed.   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  And/or primary care for vitamin D levels, thyroid disorders and A1c diabetes screening patient was receptive to plan.  Support, encouragement and  reassurance was provided.  During evaluation Selena [name] is sitting, she is alert/oriented x 4; calm/cooperative; and mood congruent with affect.  Patient is speaking in a clear tone at moderate volume, and normal pace; with good eye contact. Her thought process is coherent and relevant; There is no indication that she is currently responding to internal/external stimuli or experiencing delusional thought content.  Patient denies  suicidal/self-harm/homicidal ideation, psychosis, and paranoia.  Patient has remained calm throughout assessment and has answered questions appropriately.    Visit Diagnosis:    ICD-10-CM   1. PTSD (post-traumatic stress disorder)  F43.10     2. MDD (major depressive disorder), recurrent episode, mild (HCC)  F33.0     3. GAD (generalized anxiety disorder)  F41.1       Past Psychiatric History:   Past Medical History:  Past Medical History:  Diagnosis Date   Anemia, iron deficiency    Anxiety    Asthma    Colon polyps 2014   Complication of anesthesia    per patient report , woke during endoscopy in 2014 ; no issues with subsequent colonscopy also  in 2014    Costochondritis    srturggled wiht it since age 48 ; exacerbates with excessive movement of left arm; did phsycial therapy in 08-2017 and hasnt had issues with it since    Depression    Eating disorder    Binge eating   Fibromyalgia    Gallstones    GERD (gastroesophageal reflux disease)    Heart murmur    at birth; resolved    Hyperlipidemia    Hypertension    was due to birth control  , " as soon as they took it out, it went away  "     Past Surgical History:  Procedure Laterality Date   CHOLECYSTECTOMY     COLONOSCOPY     [facility] dr Arlyce Dice    COLONOSCOPY WITH PROPOFOL N/A 08/01/2021   Procedure: COLONOSCOPY WITH PROPOFOL;  Surgeon: Pasty Spillers, MD;  Location: [facility] END[facility];  Service: Endoscopy;  Laterality: N/A;  EVALUATION UNDER ANESTHESIA WITH HEMORRHOIDECTOMY N/A 06/24/2018   Procedure: EXAM UNDER ANESTHESIA WITH EXTERNAL HEMORRHOIDECTOMY;  Surgeon: Luretha Murphy, MD;  Location: [facility];  Service: General;  Laterality: N/A;   EXPLORATORY LAPAROTOMY  2000   for infertility   GANGLION CYST EXCISION     GASTRIC ROUX-EN-Y N/A 01/24/2018   Procedure: LAPAROSCOPIC ROUX-EN-Y GASTRIC BYPASS WITH UPPER ENDOSCOPY;  Surgeon: Luretha Murphy, MD;  Location: [facility];  Service: General;  Laterality:  N/A;   SPHINCTEROTOMY N/A 06/24/2018   Procedure: LATERAL INTERNAL SPHINCTEROTOMY;  Surgeon: Luretha Murphy, MD;  Location: [facility];  Service: General;  Laterality: N/A;   TONSILLECTOMY  1989    Family Psychiatric History:   Family History:  Family History  Problem Relation Age of Onset   Hyperlipidemia Mother    Heart disease Mother        Atrial fibrilation   Cancer Mother        ? Melanoma   Atrial fibrillation Mother    Depression Father    Bipolar disorder Father    Diabetes Father    Mental illness Father        Bipolar   Leukemia Father 73       MDS< then leukemia    Depression Sister    Cancer Sister        Clear cell sarcoma in leg   Hypothyroidism Sister    Arrhythmia Maternal Grandfather    Hypertension Maternal Grandmother    Heart disease Maternal Grandmother        Afib   Arrhythmia Maternal Grandmother    Arthritis Paternal Grandmother    ADD / ADHD Nephew    Anxiety disorder Nephew    ADD / ADHD Son    Autism spectrum disorder Son    Colon cancer Neg Hx    Rectal cancer Neg Hx    Pancreatic cancer Neg Hx     Social History:  Social History   Socioeconomic History   Marital status: Divorced    Spouse name: Not on file   Number of children: 1   Years of education: Not on file   Highest education level: Master's degree (e.g., MA, MS, MEng, MEd, MSW, MBA)  Occupational History   Occupation: Teaches preschool    Employer: [employer]  Tobacco Use   Smoking status: Never    Passive exposure: Never   Smokeless tobacco: Never  Vaping Use   Vaping status: Never Used  Substance and Sexual Activity   Alcohol use: Not Currently    Comment: occasional   Drug use: No   Sexual activity: Yes    Birth control/protection: I.U.D.  Other Topics Concern   Not on file  Social History Narrative   2 Step children; asthma, ADHD   Social Determinants of Health   Financial Resource Strain: Not on file  Food Insecurity: Not on file   Transportation Needs: Not on file  Physical Activity: Not on file  Stress: Not on file  Social Connections: Not on file    Allergies:  Allergies  Allergen Reactions   Fish Allergy Nausea And Vomiting   Augmentin [Amoxicillin-Pot Clavulanate] Diarrhea    Can take regular amoxicillin with no issues Has patient had a PCN reaction causing immediate rash, facial/tongue/throat swelling, SOB or lightheadedness with hypotension: No Has patient had a PCN reaction causing severe rash involving mucus membranes or skin necrosis: No Has patient had a PCN reaction that required hospitalization: No Has patient had a PCN reaction occurring within the  last 10 years: Yes--but DIARRHEA ONLY If all of the above answers are "NO", then may proceed with Cephalosporin [place]   Citalopram Other (See Comments)    Tachycardia    Egg-Derived Products Diarrhea and Nausea And Vomiting   Requip [Ropinirole]     Not tolerated    Sulfonamide Derivatives Itching    Metabolic Disorder Labs: Lab Results  Component Value Date   HGBA1C 5.6 01/16/2022   Lab Results  Component Value Date   PROLACTIN 5.7 01/16/2022   PROLACTIN 7.5 09/05/2015   Lab Results  Component Value Date   CHOL 158 06/11/2021   TRIG 68.0 06/11/2021   HDL 58.30 06/11/2021   CHOLHDL 3 06/11/2021   VLDL 13.6 06/11/2021   LDLCALC 86 06/11/2021   LDLCALC 89 04/05/2019   Lab Results  Component Value Date   TSH 0.90 01/16/2022   TSH 0.95 12/10/2021    Therapeutic Level Labs: No results found for: "LITHIUM" No results found for: "VALPROATE" No results found for: "CBMZ"  Current Medications: Current Outpatient Medications  Medication Sig Dispense Refill   busPIRone (BUSPAR) 10 MG tablet Take 2 tablets (20 mg total) by mouth 2 (two) times daily. 120 tablet 1   calcium gluconate 500 MG tablet Take 1 tablet by mouth 2 (two) times daily.      clonazePAM (KLONOPIN) 1 MG tablet Take 0.5-1 tablets (0.5-1 mg total) by mouth as directed. Take  only for severe anxiety attacks , once daily as needed.Please limit use 15 tablet 0   FLUoxetine (PROZAC) 40 MG capsule TAKE 2 CAPSULES BY MOUTH DAILY 180 capsule 0   hydrOXYzine (ATARAX) 25 MG tablet Take 1-2 tablets (25-50 mg total) by mouth at bedtime as needed for anxiety. And sleep 180 tablet 0   Insulin Pen Needle (PEN NEEDLES) 31G X 6 MM MISC For use with victoza /saxenda 100 each 0   levonorgestrel (MIRENA) 20 MCG/24HR IUD 1 Intra Uterine Device (1 each total) by Intrauterine route once. 1 each 0   mirtazapine (REMERON) 7.5 MG tablet Take 1 tablet (7.5 mg total) by mouth at bedtime. (Patient not taking: Reported on 08/11/2023) 30 tablet 1   Multiple Vitamins-Iron (MULTI-VITAMIN/IRON PO) Take 1 tablet by mouth daily.      omeprazole (PRILOSEC OTC) 20 MG tablet Take 20 mg by mouth daily before breakfast.      rizatriptan (MAXALT) 10 MG tablet TAKE 1 TABLET BY MOUTH AS NEEDED FOR MIGRAINE. MAY REPEAT IN 2 HOURS IF NEEDED 10 tablet 2   topiramate (TOPAMAX) 100 MG tablet TAKE 1 TABLET BY MOUTH EVERY DAY 90 tablet 0   Vitamin D, Ergocalciferol, (DRISDOL) 1.25 MG (50000 UNIT) CAPS capsule TAKE 1 CAPSULE BY MOUTH ONE TIME PER WEEK 12 capsule 3   No current facility-administered medications for this visit.     Musculoskeletal: Strength & Muscle Tone: within normal limits Gait & Station: normal Patient leans: N/A  Psychiatric Specialty Exam: Review of Systems  Psychiatric/Behavioral:  The patient is nervous/anxious.   All other systems reviewed and are negative.   There were no vitals taken for this visit.There is no height or weight on file to calculate BMI.  General Appearance: Casual  Eye Contact:  Good  Speech:  Clear and Coherent  Volume:  Normal  Mood:  Anxious and Depressed  Affect:  Congruent  Thought Process:  Coherent  Orientation:  Full (Time, Place, and Person)  Thought Content: Logical   Suicidal Thoughts:  No  Homicidal Thoughts:  No  Memory:  Immediate;    Good Recent;   Good  Judgement:  Good  Insight:  Good  Psychomotor Activity:  Normal  Concentration:  Concentration: Good  Recall:  Good  Fund of Knowledge: Good  Language: Good  Akathisia:  No  Handed:  Right  AIMS (if indicated): not done  Assets:  Communication Skills Desire for Improvement  ADL's:  Intact  Cognition: WNL  Sleep:  Good   Screenings: GAD-7    Flowsheet Row Office Visit from 02/16/2023 in confidential department Office Visit from 11/16/2022 in confidential department Office Visit from 10/21/2022 in [facility] Visit from 08/24/2022 in confidential department Office Visit from 07/30/2022 in confidential department  Total GAD-7 Score 4 4 0 17 21      PHQ2-9    Flowsheet Row Counselor from 08/10/2023 in [facility] Video Visit from 07/05/2023 in confidential department Office Visit from 02/16/2023 in confidential department Office Visit from 11/16/2022 in confidential department Office Visit from 10/21/2022 in [facility]  PHQ-2 Total Score 6 2 0 1 0  PHQ-9 Total Score 22 12 1  -- --      Flowsheet Row Counselor from 08/10/2023 in BEHAVIORAL HEALTH INTENSIVE PSYCH Office Visit from 08/04/2023 in confidential department Video Visit from 07/05/2023 in confidential department  C-SSRS RISK CATEGORY Error: Question 6 not populated No Risk No Risk        Assessment and Plan:  Continue Intensive outpatient programming (IOP) Continue Prozac 80 mg daily Continue Klonopin .05 mg -1 mg as needed Continue hydroxyzine 25 mg-50 mg as needed nightly -Denied that she is currently taking Remeron  Declined to have any medication adjustments at this visit.  Will follow-up as needed.    Collaboration of Care: Collaboration of Care: Medication Management AEB continue with above  Patient/Guardian was advised Release of Information must be obtained prior to any record  release in order to collaborate their care with an outside provider. Patient/Guardian was advised if they have not already done so to contact the registration department to sign all necessary forms in order for [place] to release information regarding their care.   Consent: Patient/Guardian gives verbal consent for treatment and assignment of benefits for services provided during this visit. Patient/Guardian expressed understanding and agreed to proceed.    Oneta Rack, NP 08/17/2023, 12:53 PM

## 2023-08-17 NOTE — Progress Notes (Signed)
Virtual Visit via Video Note   I connected with Selena [name] on 08/17/23 at  9:00 AM EDT by a video enabled telemedicine application and verified that I am speaking with the correct person using two identifiers.   At orientation to the IOP program, Case Manager discussed the limitations of evaluation and management by telemedicine and the availability of in person appointments. The patient expressed understanding and agreed to proceed with virtual visits throughout the duration of the program.   Location:  Patient: Patient Home Provider: [facility]   History of Present Illness: MDD, GAD and PTSD   Observations/Objective: Check In: Case Manager checked in with all participants to review discharge dates, insurance authorizations, work-related documents and needs from the treatment team regarding medications. Selena [name] stated needs and engaged in discussion.    Initial Therapeutic Activity: Counselor facilitated a check-in with Selena [name] to assess for safety, sobriety and medication compliance.  Counselor also inquired about Selena [name]'s current emotional ratings, as well as any significant changes in thoughts, feelings or behavior since previous check in.  Selena [name] presented for session on time and was alert, oriented x5, with no evidence or self-report of active SI/HI or A/V H.  Selena [name] reported compliance with medication and denied use of alcohol or illicit substances.  Selena [name] reported scores of 3/10 for depression, 4/10 for anxiety, and 0/10 for anger/irritability.  Selena [name] denied any recent outbursts or panic attacks.  Selena [name] reported that a recent success was going to the eye doctor yesterday, and then doing some laundry.  Selena [name] reported that a struggle was dealing with her mom, as she felt like Selena [name] was overworking herself.  Selena [name] reported that her goal today is to assist her principal with a small task after group.        Second Therapeutic Activity: Counselor  engaged the group in discussion on managing work/life balance today to improve mental health and wellness.  Counselor explained how finding balance between responsibilities at home and work place can be challenging, and lead to increased stress.  Counselor facilitated discussion on what challenges members are currently, or have historically faced.  Counselor also discussed strategies for improving work/life balance while members work on their mental health during treatment.  Some of these included keeping track of time management; creating a list of priorities and scaling importance; setting realistic, measurable goals each day; establishing boundaries; taking care of health needs; and nurturing relationships at home and work for support.  Counselor inquired about areas where members feel they are excelling, as well as areas they could focus on during treatment. 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  She stated "I didn't have time to eat, its like you wake up and its time to work again".  Selena [name] reported that she experienced several symptoms of burnout, including isolating from others, feeling overwhelmed, sleeping too much, feeling tired and drained.  Selena [name] reported that there have also been numerous warning signs such as working through lunch breaks, taking work home after hours, and pulling out of social engagements due to lack of energy and motivation.  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  Assessment and Plan: Counselor recommends that Selena [name] remain in IOP treatment to better manage mental health symptoms, ensure stability and pursue completion of treatment plan goals. Counselor recommends adherence to crisis/safety plan, taking medications as prescribed, and following up with medical professionals if any issues arise.    Follow Up Instructions: Counselor will send Webex link for session tomorrow.  Selena [name] was advised to call back or seek an in-person evaluation if the symptoms worsen or if the condition fails to improve as anticipated.   Collaboration of Care:   Medication Management AEB Dr. Alfonse Flavors or Hillery Jacks, NP                                          Case Manager AEB [name], CNA    Patient/Guardian was advised Release of Information must be obtained prior to any record release in order to collaborate their care with an outside provider. Patient/Guardian was advised if they have not already done so to contact the registration department to sign all necessary forms in order for [place] to release information regarding their care.    Consent: Patient/Guardian gives verbal consent for treatment and assignment of benefits for services provided during this visit. Patient/Guardian expressed understanding and agreed to proceed.   I provided 180 minutes of non-face-to-face time during this encounter.   Noralee Stain, [place], LCAS 08/17/23

## 2023-08-18 ENCOUNTER — Other Ambulatory Visit (HOSPITAL_COMMUNITY): Payer: BC Managed Care – PPO | Admitting: Licensed Clinical Social Worker

## 2023-08-18 DIAGNOSIS — F330 Major depressive disorder, recurrent, mild: Secondary | ICD-10-CM | POA: Diagnosis not present

## 2023-08-18 DIAGNOSIS — F4310 Post-traumatic stress disorder, unspecified: Secondary | ICD-10-CM

## 2023-08-18 DIAGNOSIS — F411 Generalized anxiety disorder: Secondary | ICD-10-CM

## 2023-08-18 NOTE — Progress Notes (Signed)
Virtual Visit via Video Note   I connected with Selena [name] on 08/18/23 at  9:00 AM EDT by a video enabled telemedicine application and verified that I am speaking with the correct person using two identifiers.   At orientation to the IOP program, Case Manager discussed the limitations of evaluation and management by telemedicine and the availability of in person appointments. The patient expressed understanding and agreed to proceed with virtual visits throughout the duration of the program.   Location:  Patient: Patient Home Provider: [facility]   History of Present Illness: MDD, GAD and PTSD   Observations/Objective: Check In: Case Manager checked in with all participants to review discharge dates, insurance authorizations, work-related documents and needs from the treatment team regarding medications. Selena [name] stated needs and engaged in discussion.    Initial Therapeutic Activity: Counselor facilitated a check-in with Selena [name] to assess for safety, sobriety and medication compliance.  Counselor also inquired about Selena [name]'s current emotional ratings, as well as any significant changes in thoughts, feelings or behavior since previous check in.  Selena [name] presented for session on time and was alert, oriented x5, with no evidence or self-report of active SI/HI or A/V H.  Selena [name] reported compliance with medication and denied use of alcohol or illicit substances.  Selena [name] reported scores of 2/10 for depression, 4/10 for anxiety, and 0/10 for anger/irritability.  Selena [name] denied any recent outbursts or panic attacks.  Selena [name] reported that a recent success was assisting her principal yesterday by making some comments on report cards that are about to be issued.  Selena [name] reported that she also spoke with her principal about her anxiety, and other opportunities that might be less stressful for her.  Selena [name] reported that her goal today is to go to her sister's house and do their  nails together.        Second Therapeutic Activity: Counselor introduced topic of assertive communication today.  Counselor shared various handouts with members virtually in group to read along with on the subject.  These handouts defined assertive communication as a communication style in which a person stands up for their own needs and wants, while also taking into consideration the needs and wants of others, without behaving in a passive or aggressive way.  Traits of assertive communicators were highlighted such as using appropriate speaking volume, maintaining eye contact, using confident language, and avoiding interruption.  Members were also provided with tips on how to improve communication, including respecting oneself, expressing thoughts and feelings calmly, and saying "No" when necessary.  Members were given a variety of scenarios where they could practice using these tips to respond in an assertive manner.  Intervention was effective, as evidenced by Selena [name] participating in discussion on topic, reporting that she has a passive communication style due to traits such as having trouble expressing needs or wants, prioritizing the needs of others, and allowing others to take advantage of her kindness.  Selena [name] reported that this has led to issues such as overwhelming herself at school despite the triggers for trauma that have negatively impacted her mental health.  Selena [name] stated "Its hard for me to address problems.  I'm not as confident when I need to be assertive".  Selena [name] showed more effective use of assertive communication skills through engagement in roleplay activities.    Assessment and Plan: Counselor recommends that Selena [name] remain in IOP treatment to better manage mental health symptoms, ensure stability and pursue completion of treatment plan goals. Counselor recommends adherence to crisis/safety plan, taking medications as  prescribed, and following up with medical professionals if  any issues arise.    Follow Up Instructions: Counselor will send Webex link for session tomorrow.  Selena [name] was advised to call back or seek an in-person evaluation if the symptoms worsen or if the condition fails to improve as anticipated.   Collaboration of Care:   Medication Management AEB Dr. Alfonse Flavors or Hillery Jacks, NP                                          Case Manager AEB [name], CNA    Patient/Guardian was advised Release of Information must be obtained prior to any record release in order to collaborate their care with an outside provider. Patient/Guardian was advised if they have not already done so to contact the registration department to sign all necessary forms in order for [place] to release information regarding their care.    Consent: Patient/Guardian gives verbal consent for treatment and assignment of benefits for services provided during this visit. Patient/Guardian expressed understanding and agreed to proceed.   I provided 180 minutes of non-face-to-face time during this encounter.   Noralee Stain, [place], LCAS 08/18/23

## 2023-08-19 ENCOUNTER — Other Ambulatory Visit (HOSPITAL_COMMUNITY): Payer: BC Managed Care – PPO | Admitting: Licensed Clinical Social Worker

## 2023-08-19 DIAGNOSIS — F330 Major depressive disorder, recurrent, mild: Secondary | ICD-10-CM

## 2023-08-19 DIAGNOSIS — F411 Generalized anxiety disorder: Secondary | ICD-10-CM

## 2023-08-19 DIAGNOSIS — F4310 Post-traumatic stress disorder, unspecified: Secondary | ICD-10-CM

## 2023-08-19 NOTE — Progress Notes (Signed)
Virtual Visit via Video Note   I connected with Selena [name] on 08/19/23 at  9:00 AM EDT by a video enabled telemedicine application and verified that I am speaking with the correct person using two identifiers.   At orientation to the IOP program, Case Manager discussed the limitations of evaluation and management by telemedicine and the availability of in person appointments. The patient expressed understanding and agreed to proceed with virtual visits throughout the duration of the program.   Location:  Patient: Patient Home Provider: [facility]   History of Present Illness: MDD, GAD and PTSD   Observations/Objective: Check In: Case Manager checked in with all participants to review discharge dates, insurance authorizations, work-related documents and needs from the treatment team regarding medications. Selena [name] stated needs and engaged in discussion.    Initial Therapeutic Activity: Counselor facilitated a check-in with Selena [name] to assess for safety, sobriety and medication compliance.  Counselor also inquired about Selena [name]'s current emotional ratings, as well as any significant changes in thoughts, feelings or behavior since previous check in.  Selena [name] presented for session on time and was alert, oriented x5, with no evidence or self-report of active SI/HI or A/V H.  Selena [name] reported compliance with medication and denied use of alcohol or illicit substances.  Selena [name] reported scores of 3/10 for depression, 2/10 for anxiety, and 0/10 for anger/irritability.  Selena [name] denied any recent outbursts.  Selena [name] reported that a recent success was doing some work on report cards yesterday, although this may have triggered a panic attack due to feeling overwhelmed.  Selena [name] reported that she also did more job hunting since plans with her sister got cancelled.  Selena [name] reported that her goal today is to go to her sisters house to do her nails.        Second Therapeutic Activity:  Counselor introduced [employer], [place] Chaplain to provide psychoeducation on topic of Grief and Loss with members today.  [name] began discussion by checking in with the group about their baseline mood today, general thoughts on what grief means to them and how it has affected them personally in the past.  [name] provided information on how the process of grief/loss can differ depending upon one's unique culture, and categories of loss one could experience (i.e. loss of a person, animal, relationship, job, identity, etc).  [name] encouraged members to be mindful of how pervasive loss can be, and how to recognize signs which could indicate that this is having an impact on one's overall mental health and wellbeing.  Intervention effectiveness was mixed, as Selena [name] did not participate in discussion on topic of grief, but did share during an Research scientist (life sciences) activity.    Third Therapeutic Activity: Counselor discussed topic of distress tolerance today with group.  Counselor shared virtual handout with members that offered a DBT approach represented by the acronym ACCEPTS, and outlined strategies for distracting oneself from distressing emotions, allowing appropriate time for these emotions to lesson in intensity and eventually fade away.  Strategies offered included engaging in positive activities, contributing to the wellbeing of others, comparing one's present situation to a previously difficult one to highlight resilience, using mental imagery, and physical grounding.  Counselor assisted members in creating their own realistic ACCEPTS plan for tackling a distressing emotion of their choice.  Intervention was effective, as evidenced by [name] participating in creation of the plan, choosing fearful as her emotion of focus, and identifying several helpful approaches for distraction, such as making a decorative wreathe for a family member, practicing  deep breathing or 'tapping' taught from past trauma therapy,  watching a movie with subtitles that requires more focus, or using an oil diffuser that releases pleasant scents.  Assessment and Plan: Counselor recommends that [place] remain in IOP treatment to better manage mental health symptoms, ensure stability and pursue completion of treatment plan goals. Counselor recommends adherence to crisis/safety plan, taking medications as prescribed, and following up with medical professionals if any issues arise.    Follow Up Instructions: Counselor will send Webex link for session tomorrow.  Selena [name] was advised to call back or seek an in-person evaluation if the symptoms worsen or if the condition fails to improve as anticipated.   Collaboration of Care:   Medication Management AEB Dr. Alfonse Flavors or Hillery Jacks, NP                                          Case Manager AEB [name], CNA    Patient/Guardian was advised Release of Information must be obtained prior to any record release in order to collaborate their care with an outside provider. Patient/Guardian was advised if they have not already done so to contact the registration department to sign all necessary forms in order for [place] to release information regarding their care.    Consent: Patient/Guardian gives verbal consent for treatment and assignment of benefits for services provided during this visit. Patient/Guardian expressed understanding and agreed to proceed.   I provided 180 minutes of non-face-to-face time during this encounter.   Noralee Stain, [place], LCAS 08/19/23

## 2023-08-20 ENCOUNTER — Other Ambulatory Visit (HOSPITAL_COMMUNITY): Payer: BC Managed Care – PPO | Admitting: Licensed Clinical Social Worker

## 2023-08-20 DIAGNOSIS — F411 Generalized anxiety disorder: Secondary | ICD-10-CM

## 2023-08-20 DIAGNOSIS — F330 Major depressive disorder, recurrent, mild: Secondary | ICD-10-CM

## 2023-08-20 DIAGNOSIS — F4310 Post-traumatic stress disorder, unspecified: Secondary | ICD-10-CM

## 2023-08-20 NOTE — Progress Notes (Signed)
Virtual Visit via Video Note   I connected with Selena [name] on 08/20/23 at  9:00 AM EDT by a video enabled telemedicine application and verified that I am speaking with the correct person using two identifiers.   At orientation to the IOP program, Case Manager discussed the limitations of evaluation and management by telemedicine and the availability of in person appointments. The patient expressed understanding and agreed to proceed with virtual visits throughout the duration of the program.   Location:  Patient: Patient Home Provider: [facility]   History of Present Illness: MDD, GAD and PTSD   Observations/Objective: Check In: Case Manager checked in with all participants to review discharge dates, insurance authorizations, work-related documents and needs from the treatment team regarding medications. Selena [name] stated needs and engaged in discussion.    Initial Therapeutic Activity: Counselor facilitated a check-in with Selena [name] to assess for safety, sobriety and medication compliance.  Counselor also inquired about Selena [name]'s current emotional ratings, as well as any significant changes in thoughts, feelings or behavior since previous check in.  Selena [name] presented for session on time and was alert, oriented x5, with no evidence or self-report of active SI/HI or A/V H.  Selena [name] reported compliance with medication and denied use of alcohol or illicit substances.  Selena [name] reported scores of 3/10 for depression, 4/10 for anxiety, and 0/10 for anger/irritability.  Selena [name] denied any recent outbursts or panic attacks.  Selena [name] reported that a recent success was visiting her sister yesterday to eat pizza and have her nails done while watching a TV show.  Selena [name] reported that a struggle has been ruminating about her return to work.  Selena [name] reported that her goal today is to do some work around [employer], organizing and 'tidying up' to be productive.        Second Therapeutic  Activity: Counselor introduced topic of self-esteem today and defined this as the value an individual places on oneself, based upon assessment of personal worth as a human being and approval/disapproval of one's behavior. Counselor asked members to assess their level of self-esteem at this time based upon common indicators of high self-esteem, including: accepting oneself unconditionally;  having self-respect and deep seated belief that one matters; being unaffected by other people's opinions/criticisms; and showing good control over emotions.  Counselor also explained concept of one's inner critic which serves to highlight faults and minimize strengths, directly influencing low sense of self-esteem.  Counselor then provided handout on 'strengths and qualities', which featured questions to guide discussion and increase awareness of each member's unique individual abilities which could reinforce higher self-esteem. Examples of questions included: 'things I am good at', 'challenges I have overcome', and 'what I like about myself'.  Intervention was effective, as evidenced by Selena [name] actively engaging in discussion on topic, and completing a self-esteem assessment, receiving a score of 17, which indicated a 'moderate' level of self-esteem at this time due to traits such as being filled with fears about the future, putting down her abilities and skills, and being anxious around students.  Selena [name] was receptive to several strategies offered today for increasing self-esteem during treatment, including improving self-care by getting more regular exercise, putting up reminders of accomplishments around her personal office, seeking positive feedback from people in her life like her sister, or cultivating personal strengths like open mindedness, kindness, common sense, humor, and patience.  Assessment and Plan: Counselor recommends that Selena [name] remain in IOP treatment to better manage mental health symptoms, ensure  stability and pursue completion of treatment  plan goals. Counselor recommends adherence to crisis/safety plan, taking medications as prescribed, and following up with medical professionals if any issues arise.    Follow Up Instructions: Counselor will send Webex link for session tomorrow.  Selena [name] was advised to call back or seek an in-person evaluation if the symptoms worsen or if the condition fails to improve as anticipated.   Collaboration of Care:   Medication Management AEB Dr. Alfonse Flavors or Hillery Jacks, NP                                          Case Manager AEB [name], CNA    Patient/Guardian was advised Release of Information must be obtained prior to any record release in order to collaborate their care with an outside provider. Patient/Guardian was advised if they have not already done so to contact the registration department to sign all necessary forms in order for [place] to release information regarding their care.    Consent: Patient/Guardian gives verbal consent for treatment and assignment of benefits for services provided during this visit. Patient/Guardian expressed understanding and agreed to proceed.   I provided 180 minutes of non-face-to-face time during this encounter.   Noralee Stain, [place], LCAS 08/20/23

## 2023-08-23 ENCOUNTER — Other Ambulatory Visit (HOSPITAL_COMMUNITY): Payer: BC Managed Care – PPO | Admitting: Licensed Clinical Social Worker

## 2023-08-23 DIAGNOSIS — F4310 Post-traumatic stress disorder, unspecified: Secondary | ICD-10-CM

## 2023-08-23 DIAGNOSIS — F330 Major depressive disorder, recurrent, mild: Secondary | ICD-10-CM

## 2023-08-23 DIAGNOSIS — F411 Generalized anxiety disorder: Secondary | ICD-10-CM

## 2023-08-23 NOTE — Progress Notes (Signed)
Virtual Visit via Video Note   I connected with Selena [name] on 08/23/23 at  9:00 AM EDT by a video enabled telemedicine application and verified that I am speaking with the correct person using two identifiers.   At orientation to the IOP program, Case Manager discussed the limitations of evaluation and management by telemedicine and the availability of in person appointments. The patient expressed understanding and agreed to proceed with virtual visits throughout the duration of the program.   Location:  Patient: Patient Home Provider: Home Office   History of Present Illness: MDD, GAD and PTSD   Observations/Objective: Check In: Case Manager checked in with all participants to review discharge dates, insurance authorizations, work-related documents and needs from the treatment team regarding medications. Selena [name] stated needs and engaged in discussion.    Initial Therapeutic Activity: Counselor facilitated a check-in with Selena [name] to assess for safety, sobriety and medication compliance.  Counselor also inquired about Selena [name]'s current emotional ratings, as well as any significant changes in thoughts, feelings or behavior since previous check in.  Selena [name] presented for session on time and was alert, oriented x5, with no evidence or self-report of active SI/HI or A/V H.  Selena [name] reported compliance with medication and denied use of alcohol or illicit substances.  Selena [name] reported scores of 4/10 for depression, 6/10 for anxiety, and 0/10 for anger/irritability.  Selena [name] denied any recent outbursts or panic attacks.  Selena [name] reported that a recent success was taking a daytrip to [place] with her boyfriend over the weekend to do some shopping.  Selena [name] reported that a struggle was having a coworker outreach her to check in, stating "I started to worry because I haven't heard anything from the jobs I applied to".  Selena [name] reported that her goal today is to apply for a job she  just heard about, and tidy up the house.        Second Therapeutic Activity: Counselor utilized a Cabin crew with group members today to guide discussion on topic of codependency.  This handout defined codependency as excessive emotional or psychological reliance upon someone who requires support on account of an illness or addiction.  It also explained how this issue presents in dysfunctional family systems, including behavior such as denying existence of problems, rigid boundaries on communication, strained trust, lack of individuality, and reinforcement of unhealthy coping mechanisms such as substance use.  Characteristics of co-dependent people were listed for assistance with identification, such as extreme need for approval/recognition, difficulty identifying feelings, poor communication, and more.  Members were also tasked with completing a questionnaire in order to identify signs of codependency and results were discussed afterward.  This handout also offered strategies for resolving co-dependency within one's network, including increased use of assertive communication skills in order to set appropriate boundaries.  Intervention effectiveness was mixed, as Selena [name] did not actively participate in discussion on the subject, but completed codependency questionnaire, with 12 out of 20 positive responses.  Selena [name] reported that she was feeling tired due to her medication this morning.    Assessment and Plan: Counselor recommends that Selena [name] remain in IOP treatment to better manage mental health symptoms, ensure stability and pursue completion of treatment plan goals. Counselor recommends adherence to crisis/safety plan, taking medications as prescribed, and following up with medical professionals if any issues arise.    Follow Up Instructions: Counselor will send Webex link for session tomorrow.  Selena [name] was advised to call back or seek an in-person evaluation if the symptoms worsen or if the  condition fails to improve as anticipated.   Collaboration of Care:   Medication Management AEB Dr. Alfonse Flavors or Hillery Jacks, NP                                          Case Manager AEB [name], CNA    Patient/Guardian was advised Release of Information must be obtained prior to any record release in order to collaborate their care with an outside provider. Patient/Guardian was advised if they have not already done so to contact the registration department to sign all necessary forms in order for [place] to release information regarding their care.    Consent: Patient/Guardian gives verbal consent for treatment and assignment of benefits for services provided during this visit. Patient/Guardian expressed understanding and agreed to proceed.   I provided 180 minutes of non-face-to-face time during this encounter.   Noralee Stain, [place], LCAS 08/23/23

## 2023-08-24 ENCOUNTER — Other Ambulatory Visit (HOSPITAL_COMMUNITY): Payer: BC Managed Care – PPO | Admitting: Licensed Clinical Social Worker

## 2023-08-24 DIAGNOSIS — F330 Major depressive disorder, recurrent, mild: Secondary | ICD-10-CM

## 2023-08-24 DIAGNOSIS — F4310 Post-traumatic stress disorder, unspecified: Secondary | ICD-10-CM

## 2023-08-24 DIAGNOSIS — F411 Generalized anxiety disorder: Secondary | ICD-10-CM

## 2023-08-24 NOTE — Progress Notes (Signed)
Virtual Visit via Video Note   I connected with Selena [name] on 08/24/23 at  9:00 AM EDT by a video enabled telemedicine application and verified that I am speaking with the correct person using two identifiers.   At orientation to the IOP program, Case Manager discussed the limitations of evaluation and management by telemedicine and the availability of in person appointments. The patient expressed understanding and agreed to proceed with virtual visits throughout the duration of the program.   Location:  Patient: Patient Home Provider: Home Office   History of Present Illness: MDD, GAD and PTSD   Observations/Objective: Check In: Case Manager checked in with all participants to review discharge dates, insurance authorizations, work-related documents and needs from the treatment team regarding medications. Melissia stated needs and engaged in discussion.    Initial Therapeutic Activity: Counselor facilitated a check-in with Selena [name] to assess for safety, sobriety and medication compliance.  Counselor also inquired about Selena [name]'s current emotional ratings, as well as any significant changes in thoughts, feelings or behavior since previous check in.  Selena [name] presented for session on time and was alert, oriented x5, with no evidence or self-report of active SI/HI or A/V H.  Selena [name] reported compliance with medication and denied use of alcohol or illicit substances.  Selena [name] reported scores of 2/10 for depression, 4/10 for anxiety, and 0/10 for anger/irritability.  Selena [name] denied any recent outbursts or panic attacks.  Selena [name] reported that a recent success was going out to lunch with her sister and mother yesterday.  Selena [name] reported that a struggle was falling asleep earlier than planned yesterday, which she attributed to her medication.  Selena [name] reported that her goal today is to work on her to-do list, including folding towels, putting in a job application, and making soup  for the family.         Second Therapeutic Activity: Counselor introduced [employer], Selena [name] to provide psychoeducation on topic of Grief and Loss with members today.  [name] began discussion by checking in with the group about their baseline mood today, general thoughts on what grief means to them and how it has affected them personally in the past.  [name] provided information on how the process of grief/loss can differ depending upon one's unique culture, and categories of loss one could experience (i.e. loss of a person, animal, relationship, job, identity, etc).  [name] encouraged members to be mindful of how pervasive loss can be, and how to recognize signs which could indicate that this is having an impact on one's overall mental health and wellbeing.  Intervention was effective, as evidenced by Selena [name] participating in discussion with speaker on the subject, reporting that she could relate to the idea of 'role loss', as she was a Runner, broadcasting/film/video for a long time, but the demands of her job, and exposure to trauma triggers led her to go on leave.  Selena [name] reported that she is trying to deal with having too much free time, and a slower schedule while in treatment.  Selena [name] was receptive to supportive feedback from [name] on ways to increase self-care in schedule as she adapts to this loss.    Third Therapeutic Activity: Counselor proposed practice of guided imagery exercise with members today to improve their relaxation and stress management skills.  This visualization featured a casual walk through a nature trail in a forest on a pleasant fall day.  Counselor invited members to get comfortable, achieve a relaxing breathing pattern, and then began narration of this visualization, including various sensory  details (i.e. feeling of the sun on the skin, smell of pine trees, sound of running stream and breeze through leaves) to enhance experience over course of activity.  Counselor processed  experience afterward with each member, inquiring about effectiveness of activity, any issues that may have arisen, and whether they intend to add this to self-care routine.  Intervention was effective, as evidenced by Selena [name] participating in activity successfully and reporting that she has practiced imagery activities before in therapy, and found this to be calming.  Selena [name] stated "It made me feel very peaceful, and that's good because I've felt very jittery the past few days".     Assessment and Plan: Counselor recommends that Munhall remain in IOP treatment to better manage mental health symptoms, ensure stability and pursue completion of treatment plan goals. Counselor recommends adherence to crisis/safety plan, taking medications as prescribed, and following up with medical professionals if any issues arise.    Follow Up Instructions: Counselor will send Webex link for session tomorrow.  Quashonda was advised to call back or seek an in-person evaluation if the symptoms worsen or if the condition fails to improve as anticipated.   Collaboration of Care:   Medication Management AEB Dr. Alfonse Flavors or Hillery Jacks, NP                                          Case Manager AEB [name], CNA    Patient/Guardian was advised Release of Information must be obtained prior to any record release in order to collaborate their care with an outside provider. Patient/Guardian was advised if they have not already done so to contact the registration department to sign all necessary forms in order for Selena [name] to release information regarding their care.    Consent: Patient/Guardian gives verbal consent for treatment and assignment of benefits for services provided during this visit. Patient/Guardian expressed understanding and agreed to proceed.   I provided 180 minutes of non-face-to-face time during this encounter.   Selena [name], [place], LCAS 08/24/23

## 2023-08-25 ENCOUNTER — Other Ambulatory Visit (HOSPITAL_COMMUNITY): Payer: BC Managed Care – PPO | Admitting: Licensed Clinical Social Worker

## 2023-08-25 DIAGNOSIS — F330 Major depressive disorder, recurrent, mild: Secondary | ICD-10-CM

## 2023-08-25 DIAGNOSIS — F411 Generalized anxiety disorder: Secondary | ICD-10-CM

## 2023-08-25 DIAGNOSIS — F4310 Post-traumatic stress disorder, unspecified: Secondary | ICD-10-CM
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
## 2023-08-26 ENCOUNTER — Other Ambulatory Visit (HOSPITAL_COMMUNITY): Payer: BC Managed Care – PPO | Admitting: Licensed Clinical Social Worker

## 2023-08-26 DIAGNOSIS — F4310 Post-traumatic stress disorder, unspecified: Secondary | ICD-10-CM

## 2023-08-26 DIAGNOSIS — F330 Major depressive disorder, recurrent, mild: Secondary | ICD-10-CM

## 2023-08-26 DIAGNOSIS — F411 Generalized anxiety disorder: Secondary | ICD-10-CM
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
## 2023-08-27 ENCOUNTER — Other Ambulatory Visit (HOSPITAL_COMMUNITY): Payer: BC Managed Care – PPO | Admitting: Licensed Clinical Social Worker

## 2023-08-27 ENCOUNTER — Encounter (HOSPITAL_COMMUNITY): Payer: Self-pay | Admitting: *Deleted

## 2023-08-27 DIAGNOSIS — F330 Major depressive disorder, recurrent, mild: Secondary | ICD-10-CM

## 2023-08-27 DIAGNOSIS — F4310 Post-traumatic stress disorder, unspecified: Secondary | ICD-10-CM

## 2023-08-27 DIAGNOSIS — F411 Generalized anxiety disorder: Secondary | ICD-10-CM

## 2023-08-27 NOTE — Progress Notes (Signed)
Virtual Visit via Video Note   I connected with Selena [name] on 08/27/23 at  9:00 AM EDT by a video enabled telemedicine application and verified that I am speaking with the correct person using two identifiers.   At orientation to the IOP program, Case Manager discussed the limitations of evaluation and management by telemedicine and the availability of in person appointments. The patient expressed understanding and agreed to proceed with virtual visits throughout the duration of the program.   Location:  Patient: Patient Home Provider: Home Office   History of Present Illness: MDD, GAD and PTSD   Observations/Objective: Check In: Case Manager checked in with all participants to review discharge dates, insurance authorizations, work-related documents and needs from the treatment team regarding medications. Selena [name] stated needs and engaged in discussion.    Initial Therapeutic Activity: Counselor facilitated a check-in with Selena [name] to assess for safety, sobriety and medication compliance.  Counselor also inquired about Selena [name]'s current emotional ratings, as well as any significant changes in thoughts, feelings or behavior since previous check in.  Selena [name] presented for session on time and was alert, oriented x5, with no evidence or self-report of active SI/HI or A/V H.  Selena [name] reported compliance with medication and denied use of alcohol or illicit substances.  Selena [name] reported scores of 2/10 for depression, 3/10 for anxiety, and 0/10 for anger/irritability.  Selena [name] denied any recent outbursts or panic attacks.  Selena [name] reported that a recent success was picking up supplies from the grocery store yesterday and running into a coworker, who helped her out when she forgot her wallet.  Selena [name] denied any new struggles.  Selena [name] reported that her goal today is to work on Orth[employer] for the upcoming holiday with family.        Second Therapeutic Activity: Counselor  introduced topic of building a social support network today.  Counselor explained how this can be defined as having a having a group of healthy people in one's life you can talk to, spend time with, and get help from to improve both mental and physical health.  Counselor noted that some barriers can make it difficult to connect with other people, including the presence of anxiety or depression, or moving to an unfamiliar area.  Group members were asked to assess the current state of their support network, and identify ways that this could be improved.  Tips were given on how to address previously noted barriers, such as strengthening social skills, using relaxation techniques to reduce anxiety, scheduling social time each week, and/or exploring social events nearby which could increase chances of meeting new supports.  Members were also encouraged to consider getting closer to people they already know through suggestions such as outreaching someone by text, email or phone call if they haven't spoken in awhile, doing something nice for a friend/family member unexpectedly, and/or inviting someone over for a game/movie/dinner night.  Intervention effectiveness could not be measured, as client did not participate.    Third Therapeutic Activity: Psycho-educational portion of group was provided by Virgina Evener, Interior and spatial designer of community education with [employer].  [name] provided information on history of her local agency, mission statement, and the variety of unique services offered which group members might find beneficial to engage in, including both virtual and in-person support groups, as well as peer support program for mentoring.  [name] offered time to answer member's questions regarding services and encouraged them to consider utilizing these services to assist in working towards their individual wellness goals.  Intervention  effectiveness could not be measured, as client did not participate.     Assessment and Plan: Counselor recommends that Selena [name] remain in IOP treatment to better manage mental health symptoms, ensure stability and pursue completion of treatment plan goals. Counselor recommends adherence to crisis/safety plan, taking medications as prescribed, and following up with medical professionals if any issues arise.    Follow Up Instructions: Counselor will send Webex link for session tomorrow.  Selena [name] was advised to call back or seek an in-person evaluation if the symptoms worsen or if the condition fails to improve as anticipated.   Collaboration of Care:   Medication Management AEB Dr. Alfonse Flavors or Hillery Jacks, NP                                          Case Manager AEB [name], CNA    Patient/Guardian was advised Release of Information must be obtained prior to any record release in order to collaborate their care with an outside provider. Patient/Guardian was advised if they have not already done so to contact the registration department to sign all necessary forms in order for [place] to release information regarding their care.    Consent: Patient/Guardian gives verbal consent for treatment and assignment of benefits for services provided during this visit. Patient/Guardian expressed understanding and agreed to proceed.   I provided 180 minutes of non-face-to-face time during this encounter.   Noralee Stain, [place], LCAS 08/27/23

## 2023-08-28 ENCOUNTER — Other Ambulatory Visit: Payer: Self-pay | Admitting: Psychiatry

## 2023-08-28 DIAGNOSIS — F4310 Post-traumatic stress disorder, unspecified: Secondary | ICD-10-CM

## 2023-08-28 DIAGNOSIS — F411 Generalized anxiety disorder: Secondary | ICD-10-CM

## 2023-08-30 ENCOUNTER — Other Ambulatory Visit (HOSPITAL_COMMUNITY): Payer: BC Managed Care – PPO | Admitting: Psychiatry

## 2023-08-30 DIAGNOSIS — F330 Major depressive disorder, recurrent, mild: Secondary | ICD-10-CM | POA: Diagnosis not present

## 2023-08-30 DIAGNOSIS — F4310 Post-traumatic stress disorder, unspecified: Secondary | ICD-10-CM

## 2023-08-30 DIAGNOSIS — F411 Generalized anxiety disorder: Secondary | ICD-10-CM

## 2023-08-30 NOTE — Progress Notes (Signed)
Virtual Visit via Video Note   I connected with Selena [name] on 08/30/23 at  9:00 AM EDT by a video enabled telemedicine application and verified that I am speaking with the correct person using two identifiers.   At orientation to the IOP program, Case Manager discussed the limitations of evaluation and management by telemedicine and the availability of in person appointments. The patient expressed understanding and agreed to proceed with virtual visits throughout the duration of the program.   Location:  Patient: Patient Home Provider: Home Office   History of Present Illness: MDD, GAD and PTSD   Observations/Objective: Check In: Case Manager checked in with all participants to review discharge dates, insurance authorizations, work-related documents and needs from the treatment team regarding medications. Selena [name] stated needs and engaged in discussion.    Initial Therapeutic Activity: Counselor facilitated a check-in with Selena [name] to assess for safety, sobriety and medication compliance.  Counselor also inquired about Selena [name]'s current emotional ratings, as well as any significant changes in thoughts, feelings or behavior since previous check in.  Selena [name] presented for session on time and was alert, oriented x5, with no evidence or self-report of active SI/HI or A/V H.  Selena [name] reported compliance with medication and denied use of alcohol or illicit substances.  Selena [name] reported scores of 2/10 for depression, 3/10 for anxiety, and 0/10 for anger/irritability.  Selena [name] denied any recent outbursts or panic attacks.  Selena [name] reported that a recent success was celebrating her sister's birthday over the weekend, stating "Everything came out perfectly".  Selena [name] reported that a struggle was experiencing chest tightness twice over the weekend, stating "It might have been because I talked to some people about jobs".  Selena [name] reported that her goal today is to do some laundry and  packing to prepare for the holiday.        Second Therapeutic Activity: Counselor introduced topic of grounding skills today.  Counselor defined these as simple strategies one can use to help detach from difficult thoughts or feelings temporarily by focusing on something else.  Counselor noted that grounding will not solve the problem at hand, but can provide the practitioner with time to regain control over their thoughts and/or feelings and prevent the situation from getting worse (i.e. interrupting a panic attack).  Counselor divided these into three categories (mental, physical, and soothing) and then provided examples of each which group members could practice during session.  Some of these included describing one's environment in detail or playing a categories game with oneself for mental category, taking a hot bath/shower, stretching, or carrying a grounding object for physical category, and saying kind statements, or visualizing people one cares about for soothing category.  Counselor inquired about which techniques members have used with success in the past, or will commit to learning, practicing, and applying now to improve coping abilities.  Intervention was effective, as evidenced by Selena [name] participating in discussion on the subject, trying out several of the techniques during session, and expressing interest in adding several to her available coping skills, such as describing her environment in detail, splashing cold water on her face, playing with a fidget ring on her hand, doing a yoga routine, eating popcorn as a safe treat, and focusing on deep breathing.    Assessment and Plan: Counselor recommends that Selena [name] remain in IOP treatment to better manage mental health symptoms, ensure stability and pursue completion of treatment plan goals. Counselor recommends adherence to crisis/safety plan, taking medications as prescribed, and following up with medical professionals if  any issues arise.     Follow Up Instructions: Counselor will send Webex link for session tomorrow.  Selena [name] was advised to call back or seek an in-person evaluation if the symptoms worsen or if the condition fails to improve as anticipated.   Collaboration of Care:   Medication Management AEB Dr. Alfonse Flavors or Hillery Jacks, NP                                          Case Manager AEB [name], CNA    Patient/Guardian was advised Release of Information must be obtained prior to any record release in order to collaborate their care with an outside provider. Patient/Guardian was advised if they have not already done so to contact the registration department to sign all necessary forms in order for [place] to release information regarding their care.    Consent: Patient/Guardian gives verbal consent for treatment and assignment of benefits for services provided during this visit. Patient/Guardian expressed understanding and agreed to proceed.   I provided 180 minutes of non-face-to-face time during this encounter.   Noralee Stain, [place], LCAS 08/30/23

## 2023-08-31 ENCOUNTER — Other Ambulatory Visit (HOSPITAL_COMMUNITY): Payer: BC Managed Care – PPO | Admitting: Psychiatry

## 2023-08-31 ENCOUNTER — Encounter (HOSPITAL_COMMUNITY): Payer: Self-pay | Admitting: Psychiatry

## 2023-08-31 DIAGNOSIS — F411 Generalized anxiety disorder: Secondary | ICD-10-CM

## 2023-08-31 DIAGNOSIS — F330 Major depressive disorder, recurrent, mild: Secondary | ICD-10-CM | POA: Diagnosis not present

## 2023-08-31 DIAGNOSIS — F4310 Post-traumatic stress disorder, unspecified: Secondary | ICD-10-CM
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
## 2023-08-31 NOTE — Patient Instructions (Signed)
D:  Patient successfully completed virtual MH-IOP today.  A:  Follow up with Dr. [name] on 09-09-23 @ 4:30 pm.  Pt is awaiting a phone call from her therapist [name], LCSW) in order to schedule with her.  Strongly recommend follow up with support groups, [facility] [employer] and [facility].  Return to work on 09-13-23, without any restrictions, unless Dr. [name] extend.  R:  Patient receptive.
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
## 2023-08-31 NOTE — Progress Notes (Signed)
Virtual Visit via Video Note   I connected with Selena [name] on 08/31/23 at  9:00 AM EDT by a video enabled telemedicine application and verified that I am speaking with the correct person using two identifiers.   At orientation to the IOP program, Case Manager discussed the limitations of evaluation and management by telemedicine and the availability of in person appointments. The patient expressed understanding and agreed to proceed with virtual visits throughout the duration of the program.   Location:  Patient: Patient Home Provider: OPT BH Office   History of Present Illness: MDD, GAD and PTSD   Observations/Objective: Check In: Case Manager checked in with all participants to review discharge dates, insurance authorizations, work-related documents and needs from the treatment team regarding medications. Selena [name] stated needs and engaged in discussion.    Initial Therapeutic Activity: Counselor facilitated a check-in with Selena [name] to assess for safety, sobriety and medication compliance.  Counselor also inquired about Selena [name]'s current emotional ratings, as well as any significant changes in thoughts, feelings or behavior since previous check in.  Selena [name] presented for session on time and was alert, oriented x5, with no evidence or self-report of active SI/HI or A/V H.  Selena [name] reported compliance with medication and denied use of alcohol or illicit substances.  Selena [name] reported scores of 3/10 for depression, 3/10 for anxiety, and 3/10 for anger.  Selena [name] denied any recent outbursts or panic attacks.  Selena [name] reported that a struggle was talking to her boss yesterday evening and feeling like she was stopping her from coming back to her old position, stating "Its bad enough that I'm struggling with my dad, and then it feels like she doesn't want me back".  Selena [name] reported that her goal today is to prepare to go visit family for the upcoming Thanksgiving holiday.         Second Therapeutic Activity: Counselor introduced [employer], [place] Chaplain to provide psychoeducation on topic of Grief and Loss with members today.  Selena [name] began discussion by checking in with the group about their baseline mood today, general thoughts on what grief means to them and how it has affected them personally in the past.  Selena [name] provided information on how the process of grief/loss can differ depending upon one's unique culture, and categories of loss one could experience (i.e. loss of a person, animal, relationship, job, identity, etc).  Selena [name] encouraged members to be mindful of how pervasive loss can be, and how to recognize signs which could indicate that this is having an impact on one's overall mental health and wellbeing.  Intervention effectiveness was mixed, as Selena [name] did not participate in discussion with speaker on the subject, but did reply to an icebreaker question about the upcoming holidays.    Third Therapeutic Activity: Counselor also acknowledged a graduating group member by prompting this member to reflect on progress made since beginning the MHIOP program, notable takeaways from sessions attended, challenges overcome, and plan for continued care following discharge. Counselor and group members shared observations of growth, words of encouragement and support as this member transitioned out of the program today.  Intervention was effective, as evidenced by Selena [name] participating in discussion on subject, reporting that she has mixed feelings on completing MHIOP today.  Selena [name] reported that she got used to the structure of group and enjoyed coming in each day to speak with peers and learn new coping skills.  She reported that her goal is to prioritize more self-care in her schedule and find a better work life  balance that doesn't put her mental health in jeopardy.  Selena [name] also shared words of encouragement with remaining peers, stating "I hope they grow, feel  happier, and more positive when they get to their last day in here too".   Assessment and Plan: Selena [name] has completed MHIOP and will be discharged today.  Counselor recommends adherence to crisis/safety plan, taking medications as prescribed, and following up with medical professionals if any issues arise.    Follow Up Instructions: Selena [name] was advised to call back or seek an in-person evaluation if the symptoms worsen or if the condition fails to improve as anticipated.   Collaboration of Care:   Medication Management AEB Dr. Alfonse Flavors or Hillery Jacks, NP                                          Case Manager AEB [name], CNA    Patient/Guardian was advised Release of Information must be obtained prior to any record release in order to collaborate their care with an outside provider. Patient/Guardian was advised if they have not already done so to contact the registration department to sign all necessary forms in order for [place] to release information regarding their care.    Consent: Patient/Guardian gives verbal consent for treatment and assignment of benefits for services provided during this visit. Patient/Guardian expressed understanding and agreed to proceed.   I provided 180 minutes of non-face-to-face time during this encounter.   Noralee Stain, [place], LCAS 08/31/23

## 2023-09-01 ENCOUNTER — Other Ambulatory Visit (HOSPITAL_COMMUNITY): Payer: BC Managed Care – PPO

## 2023-09-01 ENCOUNTER — Ambulatory Visit: Payer: BC Managed Care – PPO | Admitting: Licensed Clinical Social Worker

## 2023-09-01 ENCOUNTER — Encounter (HOSPITAL_COMMUNITY): Payer: Self-pay

## 2023-09-03 ENCOUNTER — Other Ambulatory Visit (HOSPITAL_COMMUNITY): Payer: BC Managed Care – PPO

## 2023-09-04 ENCOUNTER — Other Ambulatory Visit: Payer: Self-pay | Admitting: Internal Medicine

## 2023-09-06 ENCOUNTER — Other Ambulatory Visit (HOSPITAL_COMMUNITY): Payer: BC Managed Care – PPO

## 2023-09-07 ENCOUNTER — Other Ambulatory Visit (HOSPITAL_COMMUNITY): Payer: BC Managed Care – PPO

## 2023-09-08 ENCOUNTER — Other Ambulatory Visit (HOSPITAL_COMMUNITY): Payer: BC Managed Care – PPO

## 2023-09-08 ENCOUNTER — Other Ambulatory Visit: Payer: Self-pay | Admitting: Psychiatry

## 2023-09-08 DIAGNOSIS — F410 Panic disorder [episodic paroxysmal anxiety] without agoraphobia: Secondary | ICD-10-CM

## 2023-09-08 DIAGNOSIS — F411 Generalized anxiety disorder: Secondary | ICD-10-CM

## 2023-09-08 DIAGNOSIS — F4310 Post-traumatic stress disorder, unspecified: Secondary | ICD-10-CM

## 2023-09-09 ENCOUNTER — Other Ambulatory Visit (HOSPITAL_COMMUNITY): Payer: BC Managed Care – PPO

## 2023-09-09 ENCOUNTER — Ambulatory Visit: Payer: BC Managed Care – PPO | Admitting: Internal Medicine

## 2023-09-09 ENCOUNTER — Encounter: Payer: Self-pay | Admitting: Psychiatry

## 2023-09-09 ENCOUNTER — Encounter: Payer: Self-pay | Admitting: Internal Medicine

## 2023-09-09 ENCOUNTER — Ambulatory Visit: Payer: BC Managed Care – PPO | Admitting: Psychiatry

## 2023-09-09 VITALS — BP 116/78 | Temp 98.5°F | Ht 62.0 in | Wt 136.6 lb

## 2023-09-09 VITALS — BP 104/76 | HR 82 | Ht 62.0 in | Wt 137.8 lb

## 2023-09-09 DIAGNOSIS — E876 Hypokalemia: Secondary | ICD-10-CM | POA: Diagnosis not present

## 2023-09-09 DIAGNOSIS — F411 Generalized anxiety disorder: Secondary | ICD-10-CM

## 2023-09-09 DIAGNOSIS — R634 Abnormal weight loss: Secondary | ICD-10-CM | POA: Diagnosis not present

## 2023-09-09 DIAGNOSIS — F4310 Post-traumatic stress disorder, unspecified: Secondary | ICD-10-CM

## 2023-09-09 DIAGNOSIS — G4701 Insomnia due to medical condition: Secondary | ICD-10-CM

## 2023-09-09 DIAGNOSIS — Z23 Encounter for immunization: Secondary | ICD-10-CM | POA: Diagnosis not present

## 2023-09-09 DIAGNOSIS — F330 Major depressive disorder, recurrent, mild: Secondary | ICD-10-CM

## 2023-09-09 DIAGNOSIS — G2581 Restless legs syndrome: Secondary | ICD-10-CM

## 2023-09-09 LAB — BASIC METABOLIC PANEL
BUN: 13 mg/dL (ref 6–23)
CO2: 24 meq/L (ref 19–32)
Calcium: 8.7 mg/dL (ref 8.4–10.5)
Chloride: 108 meq/L (ref 96–112)
Creatinine, Ser: 0.84 mg/dL (ref 0.40–1.20)
GFR: 78.24 mL/min (ref >60.00)
Glucose, Bld: 89 mg/dL (ref 70–99)
Potassium: 3.6 meq/L (ref 3.5–5.1)
Sodium: 139 meq/L (ref 135–145)

## 2023-09-09 LAB — TSH: TSH: 0.88 u[IU]/mL (ref 0.35–5.50)

## 2023-09-09 LAB — MAGNESIUM: Magnesium: 2.1 mg/dL (ref 1.5–2.5)

## 2023-09-09 MED ORDER — CLONAZEPAM 1 MG PO TABS: 0.5000 mg | ORAL_TABLET | ORAL | 0 refills | Status: DC

## 2023-09-09 NOTE — Assessment & Plan Note (Addendum)
Was prescibed  mirapex for RLS starting dose was  0.125 mg qhs  but she is not taking it  for unclear reasons ; advise to discuss with Dr [name] givne her multiple psychoactive medications
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
## 2023-09-09 NOTE — Patient Instructions (Addendum)
Good to see you.  BLESS YOUR HEART!!!!  Let me know if I can write a letter TO YOUR EMPLOYER!     Ask Dr [name] if you can use mirapex or requip for restless legs
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
## 2023-09-10 ENCOUNTER — Other Ambulatory Visit (HOSPITAL_COMMUNITY): Payer: BC Managed Care – PPO

## 2023-09-16 ENCOUNTER — Ambulatory Visit: Payer: BC Managed Care – PPO | Admitting: Licensed Clinical Social Worker

## 2023-09-27 ENCOUNTER — Ambulatory Visit (INDEPENDENT_AMBULATORY_CARE_PROVIDER_SITE_OTHER): Payer: BC Managed Care – PPO | Admitting: Licensed Clinical Social Worker

## 2023-09-27 DIAGNOSIS — F411 Generalized anxiety disorder: Secondary | ICD-10-CM

## 2023-09-27 DIAGNOSIS — F330 Major depressive disorder, recurrent, mild: Secondary | ICD-10-CM

## 2023-09-27 DIAGNOSIS — F4310 Post-traumatic stress disorder, unspecified: Secondary | ICD-10-CM | POA: Diagnosis not present
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
## 2023-10-04 ENCOUNTER — Other Ambulatory Visit: Payer: Self-pay | Admitting: Psychiatry

## 2023-10-04 ENCOUNTER — Ambulatory Visit: Payer: BC Managed Care – PPO | Admitting: Licensed Clinical Social Worker

## 2023-10-04 DIAGNOSIS — F330 Major depressive disorder, recurrent, mild: Secondary | ICD-10-CM

## 2023-10-04 DIAGNOSIS — F4310 Post-traumatic stress disorder, unspecified: Secondary | ICD-10-CM

## 2023-10-04 DIAGNOSIS — F411 Generalized anxiety disorder: Secondary | ICD-10-CM

## 2023-10-04 DIAGNOSIS — F410 Panic disorder [episodic paroxysmal anxiety] without agoraphobia: Secondary | ICD-10-CM
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
## 2023-10-05 ENCOUNTER — Ambulatory Visit: Payer: BC Managed Care – PPO | Admitting: Internal Medicine

## 2023-10-18 ENCOUNTER — Encounter: Payer: Self-pay | Admitting: Internal Medicine

## 2023-10-21 ENCOUNTER — Encounter: Payer: Self-pay | Admitting: Psychiatry

## 2023-10-21 ENCOUNTER — Telehealth: Payer: 59 | Admitting: Psychiatry

## 2023-10-21 DIAGNOSIS — G4701 Insomnia due to medical condition: Secondary | ICD-10-CM | POA: Diagnosis not present

## 2023-10-21 DIAGNOSIS — F330 Major depressive disorder, recurrent, mild: Secondary | ICD-10-CM | POA: Diagnosis not present

## 2023-10-21 DIAGNOSIS — F4310 Post-traumatic stress disorder, unspecified: Secondary | ICD-10-CM | POA: Diagnosis not present

## 2023-10-21 DIAGNOSIS — F411 Generalized anxiety disorder: Secondary | ICD-10-CM

## 2023-10-21 MED ORDER — FLUOXETINE HCL 40 MG PO CAPS: 80.0000 mg | ORAL_CAPSULE | Freq: Every day | ORAL | 1 refills | Status: DC
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
## 2023-10-29 ENCOUNTER — Other Ambulatory Visit: Payer: Self-pay | Admitting: Psychiatry

## 2023-10-29 DIAGNOSIS — G4701 Insomnia due to medical condition: Secondary | ICD-10-CM

## 2023-11-01 ENCOUNTER — Ambulatory Visit (INDEPENDENT_AMBULATORY_CARE_PROVIDER_SITE_OTHER): Payer: 59 | Admitting: Licensed Clinical Social Worker

## 2023-11-01 DIAGNOSIS — F330 Major depressive disorder, recurrent, mild: Secondary | ICD-10-CM

## 2023-11-01 DIAGNOSIS — F4310 Post-traumatic stress disorder, unspecified: Secondary | ICD-10-CM

## 2023-11-01 DIAGNOSIS — F411 Generalized anxiety disorder: Secondary | ICD-10-CM | POA: Diagnosis not present
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
## 2023-11-22 ENCOUNTER — Ambulatory Visit (INDEPENDENT_AMBULATORY_CARE_PROVIDER_SITE_OTHER): Payer: Self-pay | Admitting: Licensed Clinical Social Worker

## 2023-11-22 DIAGNOSIS — F4310 Post-traumatic stress disorder, unspecified: Secondary | ICD-10-CM

## 2023-11-22 DIAGNOSIS — F411 Generalized anxiety disorder: Secondary | ICD-10-CM

## 2023-11-22 DIAGNOSIS — F330 Major depressive disorder, recurrent, mild: Secondary | ICD-10-CM

## 2023-11-22 NOTE — Progress Notes (Signed)
   THERAPIST PROGRESS NOTE  Session Time: 4:02pm-4:52pm  Participation Level: Active  Behavioral Response: CasualAlertEuthymic  Type of Therapy: Individual Therapy  Treatment Goals addressed: Goal: LTG: Elimination of maladaptive behaviors and thinking patterns which interfere with resolution of trauma as evidenced by self report.  Goal: LTG: Develop and implement effective coping skills to carry out normal responsibilities and participate constructively in relationships as evidenced by self report.  Goal: LTG: Pt reports:  "feel like I have a little more control over my own emotions and feel like I'm a little more balanced."    Goal: STG: [name] will identify internal and external stimuli that trigger PTSD symptoms  ProgressTowards Goals: Progressing  Interventions: Supportive and Other: EMDR  Summary:Selena [name] is a 57 y.o. female who presents with symptoms of anxiety. Patient identifies symptoms to include uncontrollable worry, negative self affect, compulsions, isolated behaviors.Pt was oriented times 5. Pt was cooperative and engaged. Pt denies SI/HI/AVH.   Patient utilized therapeutic space to process recent stressors related to her job and relationship with her child.  Addressed controllable versus uncontrollable factors and use of assertive communication to maintain healthy boundaries.  Clinician began resourcing with patient.  Patient reports she has engaged in EMDR therapy before ,therefore, clinician reviewed resources.  Patient revisited her container and peaceful place.  Clinician educated patient on 7-11 breathing.  Reinforced with bilateral tapping.  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  Assess for readiness to begin constructing her target sequence plan next session.  Patient  expressed readiness.  Suicidal/Homicidal: Nowithout intent/plan  Therapist Response: Cln utilized active and supportive reflection to create a safe environment for patient to process recent life stressors. Clinician assessed for current symptoms, stressors, safety since last session.  Reviewed resourcing and use of container as well as patient's peaceful place.  Clinician educated patient on 7-11 breathing.  Plan: Return again in 4 weeks.  Diagnosis: GAD (generalized anxiety disorder)  PTSD (post-traumatic stress disorder)  MDD (major depressive disorder), recurrent episode, mild (HCC)   Collaboration of Care: AEB psychiatrist can access notes and cln. Will review psychiatrists' notes. Check in with the patient and will see LCSW per availability. Patient agreed with treatment recommendations.   Patient/Guardian was advised Release of Information must be obtained prior to any record release in order to collaborate their care with an outside provider. Patient/Guardian was advised if they have not already done so to contact the registration department to sign all necessary forms in order for [place] to release information regarding their care.   Consent: Patient/Guardian gives verbal consent for treatment and assignment of benefits for services provided during this visit. Patient/Guardian expressed understanding and agreed to proceed.   Dereck Leep, LCSW 11/22/2023

## 2023-12-03 ENCOUNTER — Other Ambulatory Visit: Payer: Self-pay | Admitting: Psychiatry

## 2023-12-03 DIAGNOSIS — F4310 Post-traumatic stress disorder, unspecified: Secondary | ICD-10-CM

## 2023-12-03 DIAGNOSIS — F411 Generalized anxiety disorder: Secondary | ICD-10-CM

## 2023-12-29 ENCOUNTER — Ambulatory Visit: Payer: Self-pay | Admitting: Internal Medicine

## 2023-12-29 NOTE — Telephone Encounter (Signed)
 FYI... scheduled to see you tomorrow at 3:15 pm.

## 2023-12-29 NOTE — Telephone Encounter (Signed)
  Chief Complaint: dizziness Symptoms: dizzy  Disposition: [] ED /[] Urgent Care (no appt availability in office) / [x] Appointment(In office/virtual)/ []  [facility]/ [] Home Care/ [] Refused Recommended Disposition /[]  Mobile Bus/ []  Follow-up with PCP Additional Notes: Pt complaining of dizziness that seems to be progressing. Pt stated she first noticed it 2 weeks ago when bending over/standing up too fast.  Pt stated now she has to catch herself from falling at times. Pt stated the dizziness leaves as soon as she stands up. Pt has appt tomorrow @1515 . RN gave care advice and pt verbalized understanding.          Copied from CRM [id number]. Topic: Clinical - Red Word Triage >> Dec 29, 2023  4:54 PM Sim Boast F wrote: Red Word that prompted transfer to Nurse Triage: Patient experiencing dizziness and starting to loose balance, going on 2 weeks and it's getting worse Reason for Disposition  [1] MODERATE dizziness (e.g., vertigo; feels very unsteady, interferes with normal activities) AND [2] has NOT been evaluated by doctor (or NP/PA) for this  Answer Assessment - Initial Assessment Questions 1. DESCRIPTION: "Describe your dizziness."     When pt bends over/stands up feels dizzy  2. VERTIGO: "Do you feel like either you or the room is spinning or tilting?"      yes 3. LIGHTHEADED: "Do you feel lightheaded?" (e.g., somewhat faint, woozy, weak upon standing)     Not currently  4. SEVERITY: "How bad is it?"  "Can you walk?"   - MILD: Feels slightly dizzy and unsteady, but is walking normally.   - MODERATE: Feels unsteady when walking, but not falling; interferes with normal activities (e.g., school, work).   - SEVERE: Unable to walk without falling, or requires assistance to walk without falling.     Mild  5. ONSET:  "When did the dizziness begin?"     2 weeks  6. AGGRAVATING FACTORS: "Does anything make it worse?" (e.g., standing, change in head position)     Bending  over/ standing up  7. CAUSE: "What do you think is causing the dizziness?"     Not sure  8. RECURRENT SYMPTOM: "Have you had dizziness before?" If Yes, ask: "When was the last time?" "What happened that time?"     Yes, vertigo 20 years ago  9. OTHER SYMPTOMS: "Do you have any other symptoms?" (e.g., headache, weakness, numbness, vomiting, earache)     Denies  Protocols used: Dizziness - Vertigo-A-AH

## 2023-12-30 ENCOUNTER — Encounter: Payer: Self-pay | Admitting: Family

## 2023-12-30 ENCOUNTER — Ambulatory Visit: Payer: Self-pay | Admitting: Family

## 2023-12-30 VITALS — BP 130/70 | HR 67 | Temp 98.7°F | Ht 62.0 in | Wt 140.0 lb

## 2023-12-30 DIAGNOSIS — R42 Dizziness and giddiness: Secondary | ICD-10-CM | POA: Diagnosis not present

## 2023-12-30 MED ORDER — MECLIZINE HCL 12.5 MG PO TABS: 12.5000 mg | ORAL_TABLET | Freq: Three times a day (TID) | ORAL | 0 refills | Status: DC | PRN

## 2023-12-30 NOTE — Patient Instructions (Addendum)
 Plenty of water   Careful with position changes  MRI brain    Let us know if you dont hear back within a week in regards to an appointment being scheduled.   So that you are aware, if you are [facility] MyChart user , please pay attention to your MyChart messages as you may receive a MyChart message with a phone number to call and schedule this test/appointment own your own from our referral coordinator. This is a new process so I do not want you to miss this message.  If you are not a MyChart user, you will receive a phone call.    Start meclizine  At this point, I suspect Benign paroxysmal positional vertigo (BPPV) and have included information from [facility] below.   You may look videos for Epley's maneuvers as we discussed online.   If dizziness/vertigo persists, a referral to ENT for further evaluation and medication may be appropriate.   If there is no improvement in your symptoms, or if there is any worsening of symptoms, or if you have any additional concerns, please return to this clinic for re-evaluation; or, if we are closed, consider going to the Emergency Room for evaluation.   What is BPPV?  BPPV  is one of the most common causes of vertigo -- the sudden sensation that you're spinning or that the inside of your head is spinning. Benign paroxysmal positional vertigo causes brief episodes of mild to intense dizziness. Benign paroxysmal positional vertigo is usually triggered by specific changes in the position of your head. This might occur when you tip your head up or down, when you lie down, or when you turn over or sit up in bed. Although benign paroxysmal positional vertigo can be a bothersome problem, it's rarely serious except when it increases the chance of falls.  If you experience dizziness associated with benign paroxysmal positional vertigo (BPPV), consider these tips: Be aware of the possibility of losing your balance, which can lead to falling and serious injury.   Sit down immediately when you feel dizzy.  Use good lighting if you get up at night.  Walk with a cane for stability if you're at risk of falling.  Work closely with your doctor to manage your symptoms effectively. BPPV may recur even after successful therapy. Fortunately, although there's no cure, the condition can be managed with physical therapy and home treatments.   Dizziness [Uncertain Cause] Dizziness is a common symptom sometimes described as "lightheadedness" or feeling like you are going to faint. If it lasts for only a few seconds and is related to changes in position (such as getting up after lying or sitting for a long time), it is usually not a sign of anything serious. Dizziness that lasts for minutes to hours, or comes on for no apparent reason, may be a sign of a more serious problem (such as dehydration, a medicine reaction, disease of the heart or brain). Today's exam did not show an exact cause for your dizzy spell . Sometimes additional tests are required before a cause can be found. Therefore, it is important to follow up with your doctor if your symptoms continue. Home Care: 1) If a dizzy spell occurs and lasts more than a few seconds, lie down until it passes. If you are lying down, then you cannot hurt yourself by falling if you do faint. 2) Do not drive or operate dangerous equipment until the dizzy spells have stopped for at least 48 hours. 3) If dizzy  spells occur with sudden standing, this may be a sign of mild dehydration. Drink extra fluids over the next few days. 4) If you recently started a new medicine or if you had the dose of a current medicine increased (especially blood pressure medicine), talk with the prescribing doctor about your symptoms. Dose adjustments may be needed. Follow Up with your doctor for further evaluation within the next seven days, if your symptoms continue. Get Prompt Medical Attention if any of the following occur: -- Worsening of your  symptoms -- Fainting, headache or seizure -- Repeated vomiting -- Feeling like you or the room is spinning -- Chest, arm, neck, back or jaw pain -- Palpitations (the sense that your heart is fluttering or beating fast or hard) -- Shortness of breath -- Blood in vomit or stool (black or red color) -- Weakness of an arm or leg or one side of the face -- Difficulty with speech or vision  2000-2015 [employer] [address] [place], [place] [address]. All rights reserved. This information is not intended as a substitute for professional medical care. Always follow your healthcare professional's instructions.

## 2023-12-30 NOTE — Progress Notes (Unsigned)
 Assessment & Plan:  Vertigo Assessment & Plan: Description of vertigo most consistent with benign positional vertigo.  She is not orthostatic on exam.  See flowsheet.  Start meclizine.  Pending MRI for vertigo and for episodic disorientation.  Neurology appointment is scheduled.  Orders: -     B12 and Folate Panel -     VITAMIN D 25 Hydroxy (Vit-D Deficiency, Fractures) -     Hemoglobin A1c -     CBC with Differential/Platelet -     Comprehensive metabolic panel with GFR -     TSH -     MR BRAIN W WO CONTRAST; Future -     Meclizine HCl; Take 1 tablet (12.5 mg total) by mouth 3 (three) times daily as needed for dizziness.  Dispense: 30 tablet; Refill: 0     Return precautions given.   Risks, benefits, and alternatives of the medications and treatment plan prescribed today were discussed, and patient expressed understanding.   Education regarding symptom management and diagnosis given to patient on AVS either electronically or printed.  No follow-ups on file.  Rennie Plowman, FNP  Subjective:    Patient ID: Selena [name], female    DOB: [birth year], 58 y.o.   MRN: [id number]  CC: Selena [name] is a 58 y.o. female who presents today for an acute visit.    HPI: She is here today for vertigo x 2 weeks, daily episodes.   She felt a 'little swimmy' when bending over the in classroom. Not aggravated by turning her side to side.  She denies feeling lightheaded or that she will pass out.  Room will start to spin. Endorses fatigue.   H/o vertigo  No palpitations, SOB, sinus pressure, hearing loss, nasal congestion, tinnitus, syncope.   She eats small frequent meals.  She works at a school , first grade.  BP at school 112/70  She has two episodes of disorientation the past 2 weeks     No h/o OSA. Sleep study in 2014  She is scheduled to see Dr Epimenio Foot 02/09/24  Seen 11/26/23 for episodic disorientation, confusion. Resolved after eating and drinking.    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 360 tablet 0   calcium gluconate 500 MG tablet Take 1 tablet by mouth 2 (two) times daily.      FLUoxetine (PROZAC) 40 MG capsule Take 2 capsules (80 mg total) by mouth daily. 180 capsule 1   hydrOXYzine (ATARAX) 25 MG tablet TAKE 1-2 TABLETS (25-50 MG TOTAL) BY MOUTH AT BEDTIME AS NEEDED FOR ANXIETY. AND SLEEP 180 tablet 0   levonorgestrel (MIRENA) 20 MCG/24HR IUD 1 Intra Uterine Device (1 each total) by Intrauterine route once. 1 each 0   mirtazapine (REMERON) 7.5 MG tablet TAKE 1 TABLET BY MOUTH EVERYDAY AT BEDTIME 90 tablet 0   Multiple Vitamins-Iron (MULTI-VITAMIN/IRON PO) Take 1 tablet by mouth daily.      omeprazole (PRILOSEC OTC) 20 MG tablet Take 20 mg by mouth daily before breakfast.      rizatriptan (MAXALT) 10 MG tablet TAKE 1 TABLET BY MOUTH AS NEEDED FOR MIGRAINE. MAY REPEAT IN 2 HOURS IF NEEDED 10 tablet 2   topiramate (TOPAMAX) 100 MG tablet TAKE 1 TABLET BY MOUTH EVERY DAY 90 tablet 0  Vitamin D, Ergocalciferol, (DRISDOL) 1.25 MG (50000 UNIT) CAPS capsule TAKE 1 CAPSULE BY MOUTH ONE TIME PER WEEK 12 capsule 3   clonazePAM (KLONOPIN) 1 MG tablet Take 0.5-1 tablets (0.5-1 mg total) by mouth as directed. Take only for severe anxiety attacks , once daily as needed.Please limit use 15 tablet 0   No current facility-administered medications on file prior to visit.    Review of Systems  Constitutional:  Negative for chills and fever.  HENT:  Negative for congestion.   Eyes:  Negative for visual disturbance.  Respiratory:  Negative for cough.   Cardiovascular:  Negative for chest pain and palpitations.  Gastrointestinal:  Negative for nausea and  vomiting.  Neurological:  Negative for dizziness and syncope.  Psychiatric/Behavioral:  Positive for confusion (disorientation episodes).       Objective:    BP 130/70   Pulse 67   Temp 98.7 F (37.1 C) (Oral)   Ht 5\' 2"  (1.575 m)   Wt 140 lb (63.5 kg)   LMP  (LMP Unknown)   SpO2 99%   BMI 25.61 kg/m   BP Readings from Last 3 Encounters:  12/30/23 130/70  09/09/23 104/76  04/22/23 110/60   Wt Readings from Last 3 Encounters:  12/30/23 140 lb (63.5 kg)  09/09/23 137 lb 12.8 oz (62.5 kg)  10/21/22 150 lb 9.6 oz (68.3 kg)    Physical Exam Vitals reviewed.  Constitutional:      Appearance: She is well-developed.  HENT:     Head: Normocephalic and atraumatic.     Right Ear: Hearing, tympanic membrane, ear canal and external ear normal. No swelling or tenderness. No middle ear effusion. Tympanic membrane is not erythematous or bulging.     Left Ear: Tympanic membrane, ear canal and external ear normal. No swelling or tenderness.  No middle ear effusion. Tympanic membrane is not erythematous or bulging.     Nose: Nose normal. No rhinorrhea.     Right Sinus: No maxillary sinus tenderness or frontal sinus tenderness.     Left Sinus: No maxillary sinus tenderness or frontal sinus tenderness.     Mouth/Throat:     Pharynx: Uvula midline. No posterior oropharyngeal erythema.  Eyes:     General: Lids are normal. Lids are everted, no foreign bodies appreciated.     Conjunctiva/sclera: Conjunctivae normal.     Pupils: Pupils are equal, round, and reactive to light.     Comments: Normal fundus bilaterally   Cardiovascular:     Rate and Rhythm: Normal rate and regular rhythm.     Pulses: Normal pulses.     Heart sounds: Normal heart sounds.  Pulmonary:     Effort: Pulmonary effort is normal.     Breath sounds: Normal breath sounds. No wheezing, rhonchi or rales.  Lymphadenopathy:     Head:     Right side of head: No submental, submandibular, tonsillar, preauricular, posterior  auricular or occipital adenopathy.     Left side of head: No submental, submandibular, tonsillar, preauricular, posterior auricular or occipital adenopathy.     Cervical: No cervical adenopathy.     Right cervical: No superficial, deep or posterior cervical adenopathy.    Left cervical: No superficial, deep or posterior cervical adenopathy.  Skin:    General: Skin is warm and dry.  Neurological:     Mental Status: She is alert.     Cranial Nerves: No cranial nerve deficit.     Sensory: No sensory deficit.     Deep Tendon  Reflexes:     Reflex Scores:      Bicep reflexes are 2+ on the right side and 2+ on the left side.      Patellar reflexes are 2+ on the right side and 2+ on the left side.    Comments: Grip equal and strong bilateral upper extremities. Gait strong and steady. Able to perform  finger-to-nose without difficulty.   Dix hall pike maneuver did not vertigo. No nystagmus noted. She felt room start to spin after laying supine and then started to sit up.    Psychiatric:        Speech: Speech normal.        Behavior: Behavior normal.        Thought Content: Thought content normal.

## 2023-12-31 LAB — CBC WITH DIFFERENTIAL/PLATELET
Basophils Absolute: 0.1 10*3/uL (ref 0.0–0.1)
Basophils Relative: 0.9 % (ref 0.0–3.0)
Eosinophils Absolute: 0.1 10*3/uL (ref 0.0–0.7)
Eosinophils Relative: 1.6 % (ref 0.0–5.0)
HCT: 37 % (ref 36.0–46.0)
Hemoglobin: 12.3 g/dL (ref 12.0–15.0)
Lymphocytes Relative: 42.4 % (ref 12.0–46.0)
Lymphs Abs: 2.5 10*3/uL (ref 0.7–4.0)
MCHC: 33.1 g/dL (ref 30.0–36.0)
MCV: 94.3 fl (ref 78.0–100.0)
Monocytes Absolute: 0.4 10*3/uL (ref 0.1–1.0)
Monocytes Relative: 7.5 % (ref 3.0–12.0)
Neutro Abs: 2.8 10*3/uL (ref 1.4–7.7)
Neutrophils Relative %: 47.6 % (ref 43.0–77.0)
Platelets: 275 10*3/uL (ref 150.0–400.0)
RBC: 3.93 Mil/uL (ref 3.87–5.11)
RDW: 13.2 % (ref 11.5–15.5)
WBC: 6 10*3/uL (ref 4.0–10.5)

## 2023-12-31 LAB — B12 AND FOLATE PANEL
Folate: 11.4 ng/mL (ref >5.9)
Vitamin B-12: 263 pg/mL (ref 211–911)

## 2023-12-31 LAB — COMPREHENSIVE METABOLIC PANEL WITH GFR
ALT: 27 U/L (ref 0–35)
AST: 26 U/L (ref 0–37)
Albumin: 4.1 g/dL (ref 3.5–5.2)
Alkaline Phosphatase: 70 U/L (ref 39–117)
BUN: 11 mg/dL (ref 6–23)
CO2: 25 meq/L (ref 19–32)
Calcium: 8.6 mg/dL (ref 8.4–10.5)
Chloride: 108 meq/L (ref 96–112)
Creatinine, Ser: 0.83 mg/dL (ref 0.40–1.20)
GFR: 79.2 mL/min (ref >60.00)
Glucose, Bld: 85 mg/dL (ref 70–99)
Potassium: 3.9 meq/L (ref 3.5–5.1)
Sodium: 141 meq/L (ref 135–145)
Total Bilirubin: 0.3 mg/dL (ref 0.2–1.2)
Total Protein: 6.4 g/dL (ref 6.0–8.3)

## 2023-12-31 LAB — TSH: TSH: 0.95 u[IU]/mL (ref 0.35–5.50)

## 2023-12-31 LAB — VITAMIN D 25 HYDROXY (VIT D DEFICIENCY, FRACTURES): VITD: 25.54 ng/mL — ABNORMAL LOW (ref 30.00–100.00)

## 2023-12-31 NOTE — Assessment & Plan Note (Signed)
 Description of vertigo most consistent with benign positional vertigo.  She is not orthostatic on exam.  See flowsheet.  Start meclizine.  Pending MRI for vertigo and for episodic disorientation.  Neurology appointment is scheduled.

## 2024-01-01 LAB — HEMOGLOBIN A1C: Hgb A1c MFr Bld: 5.6 % (ref 4.6–6.5)

## 2024-01-02 ENCOUNTER — Other Ambulatory Visit: Payer: Self-pay | Admitting: Internal Medicine

## 2024-01-02 DIAGNOSIS — G43019 Migraine without aura, intractable, without status migrainosus: Secondary | ICD-10-CM

## 2024-01-03 ENCOUNTER — Ambulatory Visit (INDEPENDENT_AMBULATORY_CARE_PROVIDER_SITE_OTHER): Payer: Self-pay | Admitting: Licensed Clinical Social Worker

## 2024-01-03 ENCOUNTER — Telehealth: Payer: Self-pay | Admitting: Internal Medicine

## 2024-01-03 DIAGNOSIS — F4310 Post-traumatic stress disorder, unspecified: Secondary | ICD-10-CM | POA: Diagnosis not present

## 2024-01-03 DIAGNOSIS — F330 Major depressive disorder, recurrent, mild: Secondary | ICD-10-CM | POA: Diagnosis not present

## 2024-01-03 DIAGNOSIS — F411 Generalized anxiety disorder: Secondary | ICD-10-CM | POA: Diagnosis not present

## 2024-01-03 NOTE — Telephone Encounter (Signed)
 Lft pt vm to call ofc to sch MRI. thanks

## 2024-01-04 NOTE — Progress Notes (Signed)
   THERAPIST PROGRESS NOTE  Session Time: 4:05am-5pm  Participation Level: Active  Behavioral Response: CasualAlertEuthymic  Type of Therapy: Individual Therapy  Treatment Goals addressed: Goal: LTG: Elimination of maladaptive behaviors and thinking patterns which interfere with resolution of trauma as evidenced by self report.  Goal: LTG: Develop and implement effective coping skills to carry out normal responsibilities and participate constructively in relationships as evidenced by self report.  Goal: LTG: Pt reports:  "feel like I have a little more control over my own emotions and feel like I'm a little more balanced."    Goal: STG: Dericka will identify internal and external stimuli that trigger PTSD symptoms  ProgressTowards Goals: Progressing  Interventions: Supportive and Other: EMDR  Summary: Selena [name] is a 58 y.o. female who presents with symptoms of anxiety. Patient identifies symptoms to include uncontrollable worry, negative self affect, compulsions, isolated behaviors.Pt was oriented times 5. Pt was cooperative and engaged. Pt denies SI/HI/AVH.   Patient and clinician continued EMDR by constructing patient's target sequence plan.  Patient identified the frequent negative cognition "I am inadequate."  Patient began constructing a list of past incidents, present triggers, and future triggers associated with this negative cognition.   Patient reflected on previous therapeutic experience undergoing EMDR treatment.  Patient identified processing relationships with her parents as a main goal for this stage of EMDR treatment.  Reflected on current stressors also associated with work that contribute to the negative cognition listed above.  Suicidal/Homicidal: Nowithout intent/plan  Therapist Response: Cln utilized active and supportive reflection to create a safe environment for patient to process recent life stressors. Clinician assessed for current symptoms,  stressors, safety since last session.  Continued EMDR by constructing patient's target sequence plan.  Plan: Return again in 3 weeks.  Diagnosis: GAD (generalized anxiety disorder)  PTSD (post-traumatic stress disorder)  MDD (major depressive disorder), recurrent episode, mild (HCC)   Collaboration of Care: AEB psychiatrist can access notes and cln. Will review psychiatrists' notes. Check in with the patient and will see LCSW per availability. Patient agreed with treatment recommendations.   Patient/Guardian was advised Release of Information must be obtained prior to any record release in order to collaborate their care with an outside provider. Patient/Guardian was advised if they have not already done so to contact the registration department to sign all necessary forms in order for [place] to release information regarding their care.   Consent: Patient/Guardian gives verbal consent for treatment and assignment of benefits for services provided during this visit. Patient/Guardian expressed understanding and agreed to proceed.   Dereck Leep, LCSW 01/04/2024

## 2024-01-05 ENCOUNTER — Other Ambulatory Visit: Payer: Self-pay | Admitting: Psychiatry

## 2024-01-05 ENCOUNTER — Encounter: Payer: Self-pay | Admitting: Family

## 2024-01-05 ENCOUNTER — Other Ambulatory Visit: Payer: Self-pay | Admitting: Family

## 2024-01-05 DIAGNOSIS — E538 Deficiency of other specified B group vitamins: Secondary | ICD-10-CM

## 2024-01-05 DIAGNOSIS — F410 Panic disorder [episodic paroxysmal anxiety] without agoraphobia: Secondary | ICD-10-CM

## 2024-01-05 DIAGNOSIS — F411 Generalized anxiety disorder: Secondary | ICD-10-CM

## 2024-01-05 DIAGNOSIS — F4310 Post-traumatic stress disorder, unspecified: Secondary | ICD-10-CM

## 2024-01-07 NOTE — Telephone Encounter (Signed)
 Spoke to pt she stated that she will try the otc B12 first and then come in for b12 recheck and see if it is working

## 2024-01-17 ENCOUNTER — Telehealth: Payer: Self-pay

## 2024-01-17 NOTE — Telephone Encounter (Signed)
 Copied from CRM [id number]. Topic: General - Other >> Jan 17, 2024  8:51 AM Alysia Jumbo S wrote: Reason for CRM: Patient calling to to request that MRI results be read as soon as possible.

## 2024-01-17 NOTE — Telephone Encounter (Signed)
 Spoke with pt and she stated that she will have the MRI done on Wednesday at 2:30 pm. She stated that she just wanted to make sure it got read in a timely manor because she is still having the dizzy spells. I advised pt to send a mychart message or given us  a call after she has completed the MRI so we can follow up on the results.

## 2024-01-19 ENCOUNTER — Ambulatory Visit

## 2024-01-19 ENCOUNTER — Ambulatory Visit
Admission: RE | Admit: 2024-01-19 | Discharge: 2024-01-19 | Disposition: A | Discharge status: Home / Self Care | Source: Ambulatory Visit | Attending: Family | Admitting: Family

## 2024-01-19 ENCOUNTER — Telehealth: Payer: Self-pay

## 2024-01-19 ENCOUNTER — Encounter: Payer: Self-pay | Admitting: Internal Medicine

## 2024-01-19 VITALS — BP 110/70 | HR 78 | Temp 98.4°F | Ht 62.0 in | Wt 137.6 lb

## 2024-01-19 DIAGNOSIS — J0110 Acute frontal sinusitis, unspecified: Secondary | ICD-10-CM

## 2024-01-19 DIAGNOSIS — H6993 Unspecified Eustachian tube disorder, bilateral: Secondary | ICD-10-CM | POA: Diagnosis not present

## 2024-01-19 DIAGNOSIS — R42 Dizziness and giddiness: Secondary | ICD-10-CM | POA: Insufficient documentation

## 2024-01-19 MED ORDER — FLUTICASONE PROPIONATE 50 MCG/ACT NA SUSP: 2.0000 | Freq: Every day | NASAL | 0 refills | Status: DC

## 2024-01-19 MED ORDER — GADOBUTROL 1 MMOL/ML IV SOLN
6.0000 mL | Freq: Once | INTRAVENOUS | Status: AC | PRN
  Administered 2024-01-19: 6 mL via INTRAVENOUS

## 2024-01-19 MED ORDER — DOXYCYCLINE HYCLATE 100 MG PO TABS: 100.0000 mg | ORAL_TABLET | Freq: Two times a day (BID) | ORAL | 0 refills | Status: AC

## 2024-01-19 NOTE — Patient Instructions (Addendum)
--   You can start using nasal Flonase, 2 puffs in each nostril daily for the next 10 days and as needed after that for allergy.  -- You can wait for next couple of days before starting antibiotics but if you develop fever, worsening sinus pain you should start taking Doxycycline 100 mg twice a day for 7 days. Please take this with tall glass of water and do not lay down for 30 minutes after taking the antibiotic. Take full fat yogurt or over the counter probiotic to help reduce side effects from antibiotic.  -- Saline nasal rinses 3-4 times a day will help clear sinuses.

## 2024-01-19 NOTE — Telephone Encounter (Signed)
 Copied from CRM [id number]. Topic: General - Other >> Jan 19, 2024  4:10 PM Selena [name] wrote: Reason for CRM: Patient called in to inform Dr.Tullo that she is finish with her MRI

## 2024-01-19 NOTE — Assessment & Plan Note (Signed)
 Nasal Flonase 2 puffs in each nostril daily for 10 days then prn. Take OTC antihistamine medication like Allegra, Zyrtec, Xyzol daily to help with allergy.

## 2024-01-19 NOTE — Assessment & Plan Note (Signed)
 Most likely viral which should resolve within the next 5-7 days. She should use saline nasal rinses 3-4 times a day. Patient is traveling to [place] , I do recommend prescribing prophylactic antibiotic if she continues to have symptoms for the next 3 days or sooner if she develops fever, worsening sinus pain, thick-yellow nasal discharge.  She was counseled on antibiotic s/e including increased r/o vaginal yeast infection, photosensitivity, gi upset. Take probiotic to reduce s/e, take antibiotic with tall glass of water and do not lay down for 30 min after taking antibiotic.  - Doxycycline 100 Mg, BID for 7 days

## 2024-01-19 NOTE — Progress Notes (Signed)
 Acute Office Visit  Subjective:    Patient ID: Selena [name], female    DOB: 1966-03-15, 58 y.o.   MRN: [id number]  Chief Complaint  Patient presents with   Facial Pain   Patient is in today for following acute concerns:  1) Sinus pressure, pain, nasal congestion, postnasal drip, sneezing, hoarse voice for about 4 days. She reports symptoms has been gradually worsening since onset. There has been no fever.  Pertinent negatives include no chills, shortness of breath, sore throat or swollen glands.   Treatments tried: OTC allergy medication, Dayquill did not fully resolve symptoms. The treatment provided mild relief.  She also reports of sinusitis that seems to occur during this time of the year. She has had h/o gi upset on Augmentin.    ROS: As per HPI    Objective:    BP 110/70   Pulse 78   Temp 98.4 F (36.9 C) (Oral)   Ht 5\' 2"  (1.575 m)   Wt 137 lb 9.6 oz (62.4 kg)   LMP  (LMP Unknown)   SpO2 97%   BMI 25.17 kg/m  BP Readings from Last 3 Encounters:  01/19/24 110/70  12/30/23 130/70  09/09/23 104/76      Physical Exam Constitutional:      Appearance: Normal appearance.  HENT:     Right Ear: Tympanic membrane is bulging. Tympanic membrane is not injected or erythematous.     Left Ear: Tympanic membrane is bulging. Tympanic membrane is not injected or erythematous.     Nose:     Right Sinus: Maxillary sinus tenderness and frontal sinus tenderness present.     Left Sinus: Maxillary sinus tenderness and frontal sinus tenderness present.     Comments: Nasal mucosa: pale in color    Mouth/Throat:     Mouth: Mucous membranes are moist.     Pharynx: Oropharynx is clear. Uvula midline.  Cardiovascular:     Rate and Rhythm: Normal rate.  Pulmonary:     Effort: Pulmonary effort is normal.     Breath sounds: Normal breath sounds. No wheezing or rales.  Abdominal:     Palpations: Abdomen is soft.  Musculoskeletal:     Cervical back: Neck supple.   Lymphadenopathy:     Cervical: No cervical adenopathy.  Skin:    General: Skin is warm.  Neurological:     Mental Status: She is alert and oriented to person, place, and time.     No results found for any visits on 01/19/24.     Assessment & Plan:  Acute non-recurrent frontal sinusitis Assessment & Plan: Most likely viral which should resolve within the next 5-7 days. She should use saline nasal rinses 3-4 times a day. Patient is traveling to [place] , I do recommend prescribing prophylactic antibiotic if she continues to have symptoms for the next 3 days or sooner if she develops fever, worsening sinus pain, thick-yellow nasal discharge.  She was counseled on antibiotic s/e including increased r/o vaginal yeast infection, photosensitivity, gi upset. Take probiotic to reduce s/e, take antibiotic with tall glass of water and do not lay down for 30 min after taking antibiotic.  - Doxycycline 100 Mg, BID for 7 days    Orders: -     Doxycycline Hyclate; Take 1 tablet (100 mg total) by mouth 2 (two) times daily for 7 days.  Dispense: 14 tablet; Refill: 0  Eustachian tube dysfunction, bilateral Assessment & Plan: Nasal Flonase 2 puffs in each nostril daily for 10 days then  prn. Take OTC antihistamine medication like Allegra, Zyrtec, Xyzol daily to help with allergy.   Orders: -     Fluticasone Propionate; Place 2 sprays into both nostrils daily.  Dispense: 16 g; Refill: 0    Return if symptoms worsen or fail to improve.  Jacklin Mascot, MD

## 2024-01-20 NOTE — Telephone Encounter (Signed)
Pt is requesting MRI results.

## 2024-01-24 ENCOUNTER — Telehealth: Payer: Self-pay

## 2024-01-24 ENCOUNTER — Encounter: Payer: Self-pay | Admitting: Family

## 2024-01-24 NOTE — Telephone Encounter (Signed)
 Copied from CRM [id number]. Topic: Clinical - Lab/Test Results >> Jan 21, 2024 10:17 AM Luane Rumps D wrote: Reason for CRM: Patient calling back to request mri results.

## 2024-01-24 NOTE — Telephone Encounter (Signed)
 Spoke to pt she has seen results of MRI and responded via my chart , sent to pcp as well

## 2024-01-25 ENCOUNTER — Telehealth: Payer: Self-pay

## 2024-01-25 ENCOUNTER — Other Ambulatory Visit (HOSPITAL_COMMUNITY): Payer: Self-pay | Admitting: Psychiatry

## 2024-01-25 DIAGNOSIS — G4701 Insomnia due to medical condition: Secondary | ICD-10-CM

## 2024-01-25 MED ORDER — HYDROXYZINE HCL 25 MG PO TABS: 25.0000 mg | ORAL_TABLET | Freq: Every evening | ORAL | 0 refills | Status: DC | PRN

## 2024-01-25 NOTE — Telephone Encounter (Signed)
 sent

## 2024-01-25 NOTE — Telephone Encounter (Signed)
 Patient called requesting refill of   hydrOXYzine  (ATARAX ) 25 MG tablet   Preferred pharmacy  [facility] [phone] [facility], [place] Phone: [phone]  Fax: [phone]     Last ordered 10-29-23  Last visit 10-21-23  Next visit 02-14-24

## 2024-01-26 ENCOUNTER — Ambulatory Visit (INDEPENDENT_AMBULATORY_CARE_PROVIDER_SITE_OTHER): Payer: Self-pay | Admitting: Licensed Clinical Social Worker

## 2024-01-26 DIAGNOSIS — F411 Generalized anxiety disorder: Secondary | ICD-10-CM

## 2024-01-26 DIAGNOSIS — F330 Major depressive disorder, recurrent, mild: Secondary | ICD-10-CM

## 2024-01-26 DIAGNOSIS — F4310 Post-traumatic stress disorder, unspecified: Secondary | ICD-10-CM | POA: Diagnosis not present

## 2024-01-26 NOTE — Progress Notes (Signed)
   THERAPIST PROGRESS NOTE  Virtual Visit via Video Note  I connected with Selena [name] on 01/26/24 at  4:00 PM EDT by a video enabled telemedicine application and verified that I am speaking with the correct person using two identifiers.  Location: Patient: Address on file  Provider: [facility]   I discussed the limitations of evaluation and management by telemedicine and the availability of in person appointments. The patient expressed understanding and agreed to proceed.    I discussed the assessment and treatment plan with the patient. The patient was provided an opportunity to ask questions and all were answered. The patient agreed with the plan and demonstrated an understanding of the instructions.   The patient was advised to call back or seek an in-person evaluation if the symptoms worsen or if the condition fails to improve as anticipated.  I provided 57 minutes of non-face-to-face time during this encounter.   Marvin Slot, LCSW   Session Time: 4:06pm-5:03pm  Participation Level: Active  Behavioral Response: CasualAlertEuthymic  Type of Therapy: Individual Therapy  Treatment Goals addressed: Elimination of maladaptive behaviors and thinking patterns which interfere with resolution of trauma as evidenced by self report.  Goal: LTG: Develop and implement effective coping skills to carry out normal responsibilities and participate constructively in relationships as evidenced by self report.  Goal: LTG: Pt reports:  "feel like I have a little more control over my own emotions and feel like I'm a little more balanced."    Goal: STG: Dionna will identify internal and external stimuli that trigger PTSD symptoms  ProgressTowards Goals: Progressing  Interventions: Assertiveness Training, Supportive, and Other: EMDR  Summary: Selena [name] is a 58 y.o. female who presents with symptoms of anxiety. Patient identifies symptoms to include uncontrollable worry,  negative self affect, compulsions, isolated behaviors.Pt was oriented times 5. Pt was cooperative and engaged. Pt denies SI/HI/AVH.   Patient utilized therapeutic session to process stress related to job search.  Continue to process ways in which this experience is feeding into her feelings of inadequacy.  Patient also processed stress related to working with collogues who are attention seeking. Addressed ways in which she can establish healthy boundaries through use of assertive communication.  Finished constructing her TSP for the negative thought "I'm inadequate." Reflected on the impacts of her previous marriages on her self esteem and MH.   Suicidal/Homicidal: Nowithout intent/plan  Therapist Response: Cln utilized active and supportive reflection to create a safe environment for patient to process recent life stressors. Clinician assessed for current symptoms, stressors, safety since last session.  Continued EMDR by constructing patient's target sequence plan.   Plan: Return again in 3 weeks.  Diagnosis: GAD (generalized anxiety disorder)  MDD (major depressive disorder), recurrent episode, mild (HCC)  PTSD (post-traumatic stress disorder)   Collaboration of Care: AEB psychiatrist can access notes and cln. Will review psychiatrists' notes. Check in with the patient and will see LCSW per availability. Patient agreed with treatment recommendations.   Patient/Guardian was advised Release of Information must be obtained prior to any record release in order to collaborate their care with an outside provider. Patient/Guardian was advised if they have not already done so to contact the registration department to sign all necessary forms in order for us  to release information regarding their care.   Consent: Patient/Guardian gives verbal consent for treatment and assignment of benefits for services provided during this visit. Patient/Guardian expressed understanding and agreed to proceed.    Marvin Slot, LCSW 01/26/2024

## 2024-01-27 ENCOUNTER — Other Ambulatory Visit: Payer: Self-pay | Admitting: Family

## 2024-01-27 DIAGNOSIS — R42 Dizziness and giddiness: Secondary | ICD-10-CM

## 2024-02-05 ENCOUNTER — Other Ambulatory Visit: Payer: Self-pay | Admitting: Internal Medicine

## 2024-02-08 ENCOUNTER — Other Ambulatory Visit: Payer: Self-pay | Admitting: Internal Medicine

## 2024-02-08 MED ORDER — TOPIRAMATE 100 MG PO TABS: 100.0000 mg | ORAL_TABLET | Freq: Every day | ORAL | 0 refills | Status: DC

## 2024-02-09 ENCOUNTER — Encounter: Payer: Self-pay | Admitting: Neurology

## 2024-02-09 ENCOUNTER — Ambulatory Visit: Payer: Self-pay | Admitting: Neurology

## 2024-02-09 ENCOUNTER — Other Ambulatory Visit (INDEPENDENT_AMBULATORY_CARE_PROVIDER_SITE_OTHER)

## 2024-02-09 VITALS — BP 130/87 | HR 81 | Ht 62.0 in | Wt 138.5 lb

## 2024-02-09 DIAGNOSIS — R296 Repeated falls: Secondary | ICD-10-CM | POA: Diagnosis not present

## 2024-02-09 DIAGNOSIS — R404 Transient alteration of awareness: Secondary | ICD-10-CM

## 2024-02-09 DIAGNOSIS — R29818 Other symptoms and signs involving the nervous system: Secondary | ICD-10-CM

## 2024-02-09 DIAGNOSIS — E538 Deficiency of other specified B group vitamins: Secondary | ICD-10-CM | POA: Diagnosis not present

## 2024-02-09 DIAGNOSIS — R200 Anesthesia of skin: Secondary | ICD-10-CM | POA: Diagnosis not present
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
## 2024-02-11 LAB — CELIAC DISEASE AB SCREEN W/RFX
Antigliadin Abs, IgA: 3 U (ref 0–19)
IgA/Immunoglobulin A, Serum: 89 mg/dL (ref 87–352)
Transglutaminase IgA: <2 U/mL (ref 0–3)

## 2024-02-12 LAB — INTRINSIC FACTOR ANTIBODIES: Intrinsic Factor: NEGATIVE

## 2024-02-12 LAB — HOMOCYSTEINE: Homocysteine: 10.1 umol/L (ref <10.4)

## 2024-02-12 LAB — METHYLMALONIC ACID, SERUM: Methylmalonic Acid, Quant: 120 nmol/L (ref 55–335)

## 2024-02-14 ENCOUNTER — Ambulatory Visit (INDEPENDENT_AMBULATORY_CARE_PROVIDER_SITE_OTHER): Payer: Self-pay | Admitting: Psychiatry

## 2024-02-14 ENCOUNTER — Encounter: Payer: Self-pay | Admitting: Psychiatry

## 2024-02-14 VITALS — BP 108/76 | Temp 98.5°F | Ht 62.0 in | Wt 141.2 lb

## 2024-02-14 DIAGNOSIS — F3342 Major depressive disorder, recurrent, in full remission: Secondary | ICD-10-CM

## 2024-02-14 DIAGNOSIS — F4310 Post-traumatic stress disorder, unspecified: Secondary | ICD-10-CM

## 2024-02-14 DIAGNOSIS — F411 Generalized anxiety disorder: Secondary | ICD-10-CM | POA: Diagnosis not present

## 2024-02-14 DIAGNOSIS — G4701 Insomnia due to medical condition: Secondary | ICD-10-CM | POA: Diagnosis not present

## 2024-02-14 DIAGNOSIS — F330 Major depressive disorder, recurrent, mild: Secondary | ICD-10-CM

## 2024-02-14 MED ORDER — CLONAZEPAM 1 MG PO TABS: 0.5000 mg | ORAL_TABLET | ORAL | 0 refills | Status: DC

## 2024-02-14 MED ORDER — MIRTAZAPINE 7.5 MG PO TABS
7.5000 mg | ORAL_TABLET | Freq: Every evening | ORAL | 0 refills | Status: DC | PRN
Start: 2024-02-14 — End: 2024-08-17

## 2024-02-14 NOTE — Progress Notes (Signed)
 [facility] MD OP Progress Note  02/14/2024 4:04 PM Selena [name]  MRN:  [id number]  Chief Complaint:  Chief Complaint  Patient presents with   Follow-up   Depression   Anxiety   Medication Refill    Discussed the use of AI scribe software for clinical note transcription with the patient, who gave verbal consent to proceed.  History of Present Illness Selena [name] is a 58 year old Caucasian female who is employed, divorced, currently lives in [place], has a history of PTSD, GAD, MDD, insomnia, history of gastric bypass surgery in 2017, history of pituitary adenoma, migraine headaches, gastroesophageal reflux disease, history of abnormal LFT was evaluated in office today.  She experiences episodes of dizziness and memory issues, including an incident where she became hot, sweaty, and confused while working on her computer, unable to recall her task. Her sister measured her blood pressure at 97/70, and her blood sugar was normal at 98. She felt better after eating and drinking. 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  An MRI revealed white spots. She is scheduled for an EEG to rule out seizures and another MRI to assess for spinal cord issues. She experiences daily dizziness and is uncertain of the cause.  She has a history of gastric bypass and is currently taking B12 supplements due to low levels. She started B12 supplementation three weeks ago but has not yet rechecked her levels. Her vitamin D  is also low.  Her current medications include Fluoxetine  80 mg, Buspar  50 mg in the morning, and clonazepam  as needed. She is not taking Mirtazapine .  She does struggle with excessive sleepiness during the day.  She goes to bed at around 10 PM and wakes up at around 5 AM.  She reports she feels rested after 30 minutes or so of waking up.  She however does report dozing off throughout the day.  She does take hydroxyzine  early in  the morning and unknown if that has anything to do with feeling sleepy during the day.  She is currently following up with neurology and may need a sleep study.  She agrees to discuss with neurologist.  She currently reports anxiety situation at school as improved.  She is currently planning to stay at her current position although she has an interview lined up.  She is not sure if she wants to change schools at this time since situational stressors have improved.  She currently denies any suicidality, homicidality or perceptual disturbances.   Visit Diagnosis:    ICD-10-CM   1. PTSD (post-traumatic stress disorder)  F43.10 mirtazapine  (REMERON ) 7.5 MG tablet    2. GAD (generalized anxiety disorder)  F41.1 mirtazapine  (REMERON ) 7.5 MG tablet    clonazePAM  (KLONOPIN ) 1 MG tablet    3. Recurrent major depressive disorder, in full remission (HCC)  F33.42     4. Insomnia due to medical condition  G47.01    Anxiety      Past Psychiatric History: I have reviewed past psychiatric history from progress note on 04/22/2022.  Past trials of Wellbutrin , Celexa , nortriptyline , Paxil , Klonopin , Requip , trazodone , Seroquel .  Past Medical History:  Past Medical History:  Diagnosis Date   Anemia, iron deficiency    Anxiety    Asthma    Colon polyps 2014   Complication of anesthesia    per patient report , woke during endoscopy in 2014 ; no issues with subsequent colonscopy also  in 2014  Costochondritis    srturggled wiht it since age 79 ; exacerbates with excessive movement of left arm; did phsycial therapy in 08-2017 and hasnt had issues with it since    Depression    Eating disorder    Binge eating   Fibromyalgia    Gallstones    GERD (gastroesophageal reflux disease)    Heart murmur    at birth; resolved    Hyperlipidemia    Hypertension    was due to birth control  ,  as soon as they took it out, it went away       Past Surgical History:  Procedure Laterality Date    CHOLECYSTECTOMY     COLONOSCOPY     [facility] dr debrah    COLONOSCOPY WITH PROPOFOL  N/A 08/01/2021   Procedure: COLONOSCOPY WITH PROPOFOL ;  Surgeon: Janalyn Keene NOVAK, MD;  Location: [facility];  Service: Endoscopy;  Laterality: N/A;   EVALUATION UNDER ANESTHESIA WITH HEMORRHOIDECTOMY N/A 06/24/2018   Procedure: EXAM UNDER ANESTHESIA WITH EXTERNAL HEMORRHOIDECTOMY;  Surgeon: Gladis Cough, MD;  Location: [facility];  Service: General;  Laterality: N/A;   EXPLORATORY LAPAROTOMY  2000   for infertility   GANGLION CYST EXCISION     GASTRIC ROUX-EN-Y N/A 01/24/2018   Procedure: LAPAROSCOPIC ROUX-EN-Y GASTRIC BYPASS WITH UPPER ENDOSCOPY;  Surgeon: Gladis Cough, MD;  Location: [facility];  Service: General;  Laterality: N/A;   SPHINCTEROTOMY N/A 06/24/2018   Procedure: LATERAL INTERNAL SPHINCTEROTOMY;  Surgeon: Gladis Cough, MD;  Location: [facility];  Service: General;  Laterality: N/A;   TONSILLECTOMY  1989    Family Psychiatric History: I have reviewed family psychiatric history from progress note on 04/22/2022.  Family History:  Family History  Problem Relation Age of Onset   Hyperlipidemia Mother    Heart disease Mother        Atrial fibrilation   Cancer Mother        ? Melanoma   Atrial fibrillation Mother    Depression Father    Bipolar disorder Father    Diabetes Father    Mental illness Father        Bipolar   Leukemia Father 2       MDS< then leukemia    Depression Sister    Cancer Sister        Clear cell sarcoma in leg   Hypothyroidism Sister    Arrhythmia Maternal Grandfather    Hypertension Maternal Grandmother    Heart disease Maternal Grandmother        Afib   Arrhythmia Maternal Grandmother    Arthritis Paternal Grandmother    ADD / ADHD Nephew    Anxiety disorder Nephew    ADD / ADHD Son    Autism spectrum disorder Son    Colon cancer Neg Hx    Rectal cancer Neg Hx    Pancreatic cancer Neg Hx     Social History: I have reviewed the history  from progress note on 04/22/2022. Social History   Socioeconomic History   Marital status: Divorced    Spouse name: Not on file   Number of children: 1   Years of education: Not on file   Highest education level: Master's degree (e.g., MA, MS, MEng, MEd, MSW, MBA)  Occupational History   Occupation: Teaches preschool    Employer: [employer]  Tobacco Use   Smoking status: Never    Passive exposure: Never   Smokeless tobacco: Never  Vaping Use   Vaping status: Never Used  Substance and Sexual Activity   Alcohol use: Not Currently    Comment: occasional   Drug use: No   Sexual activity: Yes    Birth control/protection: I.U.D.  Other Topics Concern   Not on file  Social History Narrative   2 Step children; asthma, ADHD   Social Drivers of Health   Financial Resource Strain: Not on file  Food Insecurity: Not on file  Transportation Needs: Not on file  Physical Activity: Not on file  Stress: Not on file  Social Connections: Not on file    Allergies:  Allergies  Allergen Reactions   Fish Allergy Nausea And Vomiting   Augmentin  [Amoxicillin -Pot Clavulanate] Diarrhea    Can take regular amoxicillin  with no issues Has patient had a PCN reaction causing immediate rash, facial/tongue/throat swelling, SOB or lightheadedness with hypotension: No Has patient had a PCN reaction causing severe rash involving mucus membranes or skin necrosis: No Has patient had a PCN reaction that required hospitalization: No Has patient had a PCN reaction occurring within the last 10 years: Yes--but DIARRHEA ONLY If all of the above answers are NO, then may proceed with Cephalosporin us    Citalopram  Other (See Comments)    Tachycardia    Egg-Derived Products Diarrhea and Nausea And Vomiting   Requip  [Ropinirole ]     Not tolerated    Sulfonamide Derivatives Itching    Metabolic Disorder Labs: Lab Results  Component Value Date   HGBA1C 5.6 12/30/2023   Lab Results  Component  Value Date   PROLACTIN 5.7 01/16/2022   PROLACTIN 7.5 09/05/2015   Lab Results  Component Value Date   CHOL 158 06/11/2021   TRIG 68.0 06/11/2021   HDL 58.30 06/11/2021   CHOLHDL 3 06/11/2021   VLDL 13.6 06/11/2021   LDLCALC 86 06/11/2021   LDLCALC 89 04/05/2019   Lab Results  Component Value Date   TSH 0.95 12/30/2023   TSH 0.88 09/09/2023    Therapeutic Level Labs: No results found for: LITHIUM No results found for: VALPROATE No results found for: CBMZ  Current Medications: Current Outpatient Medications  Medication Sig Dispense Refill   busPIRone  (BUSPAR ) 10 MG tablet TAKE 2 TABLETS BY MOUTH TWICE A DAY 360 tablet 0   calcium gluconate 500 MG tablet Take 1 tablet by mouth 2 (two) times daily.      FLUoxetine  (PROZAC ) 40 MG capsule Take 2 capsules (80 mg total) by mouth daily. 180 capsule 1   fluticasone  (FLONASE ) 50 MCG/ACT nasal spray Place 2 sprays into both nostrils daily. 16 g 0   hydrOXYzine  (ATARAX ) 25 MG tablet Take 1-2 tablets (25-50 mg total) by mouth at bedtime as needed for anxiety. And sleep 180 tablet 0   levonorgestrel (MIRENA) 20 MCG/24HR IUD 1 Intra Uterine Device (1 each total) by Intrauterine route once. 1 each 0   Multiple Vitamins-Iron (MULTI-VITAMIN/IRON PO) Take 1 tablet by mouth daily.      omeprazole  (PRILOSEC  OTC) 20 MG tablet Take 20 mg by mouth daily before breakfast.      rizatriptan  (MAXALT ) 10 MG tablet TAKE 1 TABLET BY MOUTH AS NEEDED FOR MIGRAINE. MAY REPEAT IN 2 HOURS IF NEEDED 10 tablet 2   topiramate  (TOPAMAX ) 100 MG tablet Take 1 tablet (100 mg total) by mouth daily. 90 tablet 0   Vitamin D , Ergocalciferol , (DRISDOL ) 1.25 MG (50000 UNIT) CAPS capsule TAKE 1 CAPSULE BY MOUTH ONE TIME PER WEEK 12 capsule 3   clonazePAM  (KLONOPIN ) 1 MG tablet Take 0.5-1 tablets (0.5-1 mg total)  by mouth as directed. Take only for severe anxiety attacks , once daily as needed.Please limit use 15 tablet 0   mirtazapine  (REMERON ) 7.5 MG tablet Take 1  tablet (7.5 mg total) by mouth at bedtime as needed. For sleep 90 tablet 0   No current facility-administered medications for this visit.     Musculoskeletal: Strength & Muscle Tone: within normal limits Gait & Station: normal Patient leans: N/A  Psychiatric Specialty Exam: Review of Systems  Psychiatric/Behavioral:  Positive for sleep disturbance. The patient is nervous/anxious.     Blood pressure 108/76, temperature 98.5 F (36.9 C), temperature source Temporal, height 5' 2 (1.575 m), weight 141 lb 3.2 oz (64 kg).Body mass index is 25.83 kg/m.  General Appearance: Casual  Eye Contact:  Fair  Speech:  Clear and Coherent  Volume:  Normal  Mood:  Anxious  Affect:  Congruent  Thought Process:  Goal Directed and Descriptions of Associations: Intact  Orientation:  Full (Time, Place, and Person)  Thought Content: Logical   Suicidal Thoughts:  No  Homicidal Thoughts:  No  Memory:  Immediate;   Fair Recent;   Fair Remote;   Fair  Judgement:  Fair  Insight:  Fair  Psychomotor Activity:  Normal  Concentration:  Concentration: Fair and Attention Span: Fair  Recall:  [employer] of Knowledge: Fair  Language: Fair  Akathisia:  No  Handed:  Right  AIMS (if indicated): not done  Assets:  Desire for Improvement Housing Social Support Talents/Skills Transportation  ADL's:  Intact  Cognition: WNL  Sleep:  Excessive during the day   Screenings: GAD-7    Flowsheet Row Office Visit from 02/14/2024 in [facility] Most recent reading at 02/14/2024  5:12 PM Office Visit from 01/19/2024 in [facility] [place] Most recent reading at 01/19/2024  1:10 PM Counselor from 09/27/2023 in [facility] Most recent reading at 09/27/2023  9:52 AM Office Visit from 09/09/2023 in [facility] Most recent reading at 09/09/2023  4:56 PM Office Visit  from 09/09/2023 in [facility] [place] Most recent reading at 09/09/2023 11:12 AM  Total GAD-7 Score 7 4 10 11 12       [employer]    Flowsheet Row Office Visit from 02/14/2024 in [facility] Office Visit from 01/19/2024 in [facility] Visit from 12/30/2023 in [facility] [employer] from 09/27/2023 in [facility] Office Visit from 09/09/2023 in [facility]  PHQ-2 Total Score 2 3 0 2 4  PHQ-9 Total Score 8 10 0 10 12      Flowsheet Row Office Visit from 02/14/2024 in [facility] Video Visit from 10/21/2023 in [facility] Counselor from 09/27/2023 in [facility]  C-SSRS RISK CATEGORY No Risk No Risk No Risk        Assessment and Plan: Selena [name] is a 58 year old Caucasian female, has a history of PTSD, GAD was evaluated in office today, discussed assessment and plan as noted below.  Posttraumatic stress disorder-improving Reports trauma related symptoms have improved and her work-related stressors are better.  She does continue to struggle with sleepiness during the day.  She also had an episode where she had transient altered mental status and is currently under the  care of neurologist. - Continue Fluoxetine  80 mg daily - Continue BuSpar  20 mg twice daily - Restart Clonazepam  0.5 - 1 mg daily as needed - Continue psychotherapy sessions. - Reviewed [place] PMP AWARxE - Continue Hydroxyzine  25-50 mg daily as needed for severe anxiety attacks only.  Advised to avoid using early in the morning to avoid sleepiness during the day.  MDD in remission Currently denies any significant depression symptoms. - Continue Fluoxetine  80 mg daily -  Continue Buspirone  20 mg twice daily  Generalized anxiety disorder-improving Currently reports anxiety is improved since situational stressors have improved. - Continue Fluoxetine  as prescribed - Restart Clonazepam  0.5 - 1 mg daily as needed for severe anxiety symptoms only - Continue Hydroxyzine  25-50 mg daily as needed  Insomnia-unstable Sleep overall at night is okay although she does struggle with excessive sleepiness, dozing off throughout the day.  Currently working with neurologist and is going to discuss about referral to sleep study with neurology.  Agreeable to restart mirtazapine  on an as needed basis and avoiding hydroxyzine  use in the morning. - Start Mirtazapine  7.5 mg at bedtime as needed - Work on sleep hygiene techniques  Follow-up Follow-up in clinic in 2 months or sooner if needed.     Collaboration of Care: Collaboration of Care: Referral or follow-up with counselor/therapist AEB continue psychotherapy sessions as needed.  Patient/Guardian was advised Release of Information must be obtained prior to any record release in order to collaborate their care with an outside provider. Patient/Guardian was advised if they have not already done so to contact the registration department to sign all necessary forms in order for us  to release information regarding their care.   Consent: Patient/Guardian gives verbal consent for treatment and assignment of benefits for services provided during this visit. Patient/Guardian expressed understanding and agreed to proceed.   This note was generated in part or whole with voice recognition software. Voice recognition is usually quite accurate but there are transcription errors that can and very often do occur. I apologize for any typographical errors that were not detected and corrected.    Selena Khamis, MD 02/15/2024, 9:01 AM

## 2024-02-15 ENCOUNTER — Ambulatory Visit: Admitting: Neurology

## 2024-02-15 ENCOUNTER — Encounter: Payer: Self-pay | Admitting: Neurology

## 2024-02-15 DIAGNOSIS — R4182 Altered mental status, unspecified: Secondary | ICD-10-CM

## 2024-02-15 DIAGNOSIS — R404 Transient alteration of awareness: Secondary | ICD-10-CM

## 2024-02-15 NOTE — Progress Notes (Signed)
   [facility]  EEG (ELECTROENCEPHALOGRAM) REPORT   STUDY DATE: 02/15/2024 PATIENT NAME: Selena [name] DOB: 02/03/1966 MRN: [id number]  ORDERING CLINICIAN: Pamella Samons A. Godwin Lat, MD. PhD  TECHNOLOGIST: Arrie Lares, REEGT TECHNIQUE: Electroencephalogram was recorded utilizing standard 10-20 system of lead placement and reformatted into average and bipolar montages.  RECORDING TIME: 25 minutes 21 seconds  CLINICAL INFORMATION: 58 year old woman with episodes of transient alteration of awareness  FINDINGS: A digital EEG was performed while the patient was awake and drowsy. While awake and most alert there was a 10-11 hz posterior dominant rhythm. Voltages and frequencies were symmetric.  There were no focal, lateralizing, epileptiform activity or seizures seen.  Photic stimulation had a normal driving response. Hyperventilation and recovery did not change the underlying rhythms. EKG channel shows normal sinus rhythm the patient became drowsy and very briefly was asleep.  IMPRESSION: This is a normal EEG while the patient was awake and asleep   INTERPRETING PHYSICIAN:   Aryella Besecker A. Godwin Lat, MD, PhD, [facility] Certified in Neurology, Clinical Neurophysiology, Sleep Medicine, Pain Medicine and Neuroimaging  [facility] [address] [place], [place] [address] [phone]

## 2024-02-16 ENCOUNTER — Ambulatory Visit: Payer: Self-pay | Admitting: Neurology

## 2024-02-16 ENCOUNTER — Other Ambulatory Visit: Payer: Self-pay

## 2024-02-16 ENCOUNTER — Ambulatory Visit

## 2024-02-16 ENCOUNTER — Ambulatory Visit: Payer: Self-pay | Admitting: Family

## 2024-02-16 DIAGNOSIS — R200 Anesthesia of skin: Secondary | ICD-10-CM | POA: Diagnosis not present

## 2024-02-16 DIAGNOSIS — R296 Repeated falls: Secondary | ICD-10-CM

## 2024-02-16 DIAGNOSIS — R29818 Other symptoms and signs involving the nervous system: Secondary | ICD-10-CM

## 2024-02-17 ENCOUNTER — Ambulatory Visit: Payer: Self-pay | Admitting: Licensed Clinical Social Worker

## 2024-02-18 LAB — COPPER, SERUM: Copper: 93 ug/dL (ref 80–158)

## 2024-02-21 ENCOUNTER — Encounter: Payer: Self-pay | Admitting: Internal Medicine

## 2024-02-21 ENCOUNTER — Ambulatory Visit: Payer: Self-pay | Admitting: Internal Medicine

## 2024-02-21 NOTE — Telephone Encounter (Signed)
 Scheduled to see Deborra Falter, NP tomorrow.

## 2024-02-21 NOTE — Telephone Encounter (Signed)
 Chief Complaint: Hoarse voice last Thursday  Symptoms: Cough, earache on right side, nasal congestion, sore throat Pertinent Negatives: Patient denies fever, difficulty breathing  Disposition: [x] Appointment (VIRTUAL)  Additional Notes: Patient scheduled for a virtual appointment tomorrow. This RN educated pt on new-worsening symptom and when to call back/seek emergent care. Pt verbalized understanding and agrees to plan.     Copied from CRM [id number]. Topic: Clinical - Red Word Triage >> Feb 21, 2024  2:22 PM Howard Macho wrote: Reason for CRM: patient called stating she thought she had laryngitis and now it is sitting in her chest and her right eat hurts. Reason for Disposition  SEVERE sore throat pain  Answer Assessment - Initial Assessment Questions DESCRIPTION: "Describe your voice." (e.g., coarse, raspy, weaker, airy, scratchy, deeper)     Hoarse voice ONSET: "When did the hoarseness begin?"     Last Thursday COUGH: "Is there a cough?" If Yes, ask: "How bad is it?"     Productive cough FEVER: "Do you have a fever?" If Yes, ask: "What is your temperature, how was it measured, and when did it start?"     No  Protocols used: [facility]

## 2024-02-22 ENCOUNTER — Ambulatory Visit: Payer: Self-pay | Admitting: Family Medicine

## 2024-02-22 ENCOUNTER — Ambulatory Visit (INDEPENDENT_AMBULATORY_CARE_PROVIDER_SITE_OTHER): Admitting: Family Medicine

## 2024-02-22 ENCOUNTER — Encounter: Payer: Self-pay | Admitting: Family Medicine

## 2024-02-22 ENCOUNTER — Ambulatory Visit: Admitting: Nurse Practitioner

## 2024-02-22 VITALS — BP 120/76 | HR 74 | Temp 98.2°F | Resp 20 | Ht 62.0 in | Wt 141.0 lb

## 2024-02-22 DIAGNOSIS — J029 Acute pharyngitis, unspecified: Secondary | ICD-10-CM

## 2024-02-22 DIAGNOSIS — R051 Acute cough: Secondary | ICD-10-CM

## 2024-02-22 DIAGNOSIS — J040 Acute laryngitis: Secondary | ICD-10-CM

## 2024-02-22 LAB — POCT RAPID STREP A (OFFICE): Rapid Strep A Screen: NEGATIVE

## 2024-02-22 LAB — POC COVID19 BINAXNOW: SARS Coronavirus 2 Ag: NEGATIVE

## 2024-02-22 MED ORDER — BENZONATATE 100 MG PO CAPS: 100.0000 mg | ORAL_CAPSULE | Freq: Three times a day (TID) | ORAL | 0 refills | Status: DC | PRN

## 2024-02-22 MED ORDER — FLUTICASONE PROPIONATE 50 MCG/ACT NA SUSP: 2.0000 | Freq: Two times a day (BID) | NASAL | 1 refills | Status: AC

## 2024-02-22 MED ORDER — AZELASTINE HCL 0.1 % NA SOLN: 2.0000 | Freq: Two times a day (BID) | NASAL | 1 refills | Status: AC

## 2024-02-22 MED ORDER — PREDNISONE 20 MG PO TABS: 20.0000 mg | ORAL_TABLET | Freq: Every day | ORAL | 0 refills | Status: AC

## 2024-02-22 NOTE — Patient Instructions (Addendum)
 It was a pleasure meeting you today. Thank you for allowing me to take part in your health care.  Our goals for today as we discussed include:  Rapid Strep negative Covid negative  Start Prednisone  20 mg daily for 5 days Flonase  2 spray two times a day Astelin  2 sprays two times a day  Tessalon  perles for cough.  Can take 1 tablet 3 times a day as needed  You can take Tylenol  and/or Ibuprofen  as needed for fever reduction and pain relief.   For cough: honey 1/2 to 1 teaspoon (you can dilute the honey in water or another fluid).  You can also use guaifenesin and dextromethorphan for cough. You can use a humidifier for chest congestion and cough.  If you don't have a humidifier, you can sit in the bathroom with the hot shower running.      For sore throat: try warm salt water gargles, cepacol lozenges, throat spray, warm tea or water with lemon/honey, popsicles or ice, or OTC cold relief medicine for throat discomfort.   For congestion: take a daily anti-histamine like Xyzal as prescribed.  Humidified air is also helpful.   It is important to stay hydrated: drink plenty of fluids (water, gatorade/powerade/pedialyte, juices, or teas) to keep your throat moisturized and help further relieve irritation/discomfort.    Follow up with MD if no improvement  This is a list of the screening recommended for you and due dates:  Health Maintenance  Topic Date Due   Zoster (Shingles) Vaccine (1 of 2) Never done   Pap with HPV screening  03/20/2024   COVID-19 Vaccine (5 - 2024-25 season) 03/08/2024*   Flu Shot  05/05/2024   Mammogram  07/22/2024   Colon Cancer Screening  08/01/2024   DTaP/Tdap/Td vaccine (4 - Td or Tdap) 05/14/2028   Hepatitis C Screening  Completed   HIV Screening  Completed   HPV Vaccine  Aged Out   Meningitis B Vaccine  Aged Out  *Topic was postponed. The date shown is not the original due date.      If you have any questions or concerns, please do not hesitate to call  the office at [phone].  I look forward to our next visit and until then take care and stay safe.  Regards,   Valli Gaw, MD   [facility]

## 2024-02-22 NOTE — Progress Notes (Unsigned)
 SUBJECTIVE:   Chief Complaint  Patient presents with   Hoarse   Cough    Since this weekend   HPI Presents for acute visit  Discussed the use of AI scribe software for clinical note transcription with the patient, who gave verbal consent to proceed.  History of Present Illness Selena [name] is a 58 year old female who presents with cough, chest congestion, and laryngitis.  She developed laryngitis on Thursday, which persisted into Friday. Over the weekend, she developed a cough, and by Monday, the symptoms seemed to be moving into her chest, accompanied by productive coughing. The phlegm is green, and she experiences chest pressure, described as a heaviness rather than pain.  She is concerned about the possibility of having contracted an illness such as strep throat or COVID-19, especially since one of her parents was hospitalized with COVID-19. She also reports an earache and significant throat pain, which makes swallowing difficult. No fever, nausea, or vomiting.  She has been taking Mucinex to manage her symptoms and has been ensuring adequate hydration. She usually has a strong immune system and rarely gets sick until the end of the school year. 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  During the review of symptoms, she denies shortness of breath and chest pain but confirms significant coughing and congestion. Her voice is hoarse and not recovering, which is concerning given her need to speak frequently at work.     PERTINENT PMH / PSH: As above  OBJECTIVE:  BP 120/76   Pulse 74   Temp 98.2 F (36.8 C)   Resp 20   Ht 5\' 2"  (1.575 m)   Wt 141 lb (64 kg)   SpO2 98%   BMI 25.79 kg/m    Physical Exam Vitals reviewed.  Constitutional:      General: She is not in acute distress.    Appearance: Normal appearance. She is normal weight. She is not  ill-appearing, toxic-appearing or diaphoretic.  HENT:     Right Ear: Tympanic membrane, ear canal and external ear normal.     Left Ear: Tympanic membrane, ear canal and external ear normal.  Eyes:     General:        Right eye: No discharge.        Left eye: No discharge.     Conjunctiva/sclera: Conjunctivae normal.  Cardiovascular:     Rate and Rhythm: Normal rate and regular rhythm.     Heart sounds: Normal heart sounds.  Pulmonary:     Effort: Pulmonary effort is normal.     Breath sounds: Normal breath sounds. No wheezing or rhonchi.  Abdominal:     General: Bowel sounds are normal.  Musculoskeletal:        General: Normal range of motion.  Skin:    General: Skin is warm and dry.  Neurological:     General: No focal deficit present.     Mental Status: She is alert and oriented to person, place, and time. Mental status is at baseline.  Psychiatric:        Mood and Affect: Mood normal.        Behavior: Behavior normal.        Thought Content: Thought content normal.        Judgment: Judgment normal.           02/22/2024   10:21 AM 02/14/2024  5:11 PM 01/19/2024    1:10 PM 12/30/2023    3:54 PM 09/27/2023    9:52 AM  Depression screen PHQ 2/9  Decreased Interest 1  2 0   Down, Depressed, Hopeless 1  1 0   PHQ - 2 Score 2  3 0   Altered sleeping 1  2 0   Tired, decreased energy 1  1 0   Change in appetite 2  2 0   Feeling bad or failure about yourself  0  1 0   Trouble concentrating 1  1 0   Moving slowly or fidgety/restless 0  0 0   Suicidal thoughts 0  0 0   PHQ-9 Score 7  10 0   Difficult doing work/chores Somewhat difficult  Somewhat difficult Not difficult at all      Information is confidential and restricted. Go to Review Flowsheets to unlock data.      02/22/2024   10:21 AM 02/14/2024    5:12 PM 01/19/2024    1:10 PM 09/27/2023    9:52 AM  GAD 7 : Generalized Anxiety Score  Nervous, Anxious, on Edge 1  1   Control/stop worrying 1  1   Worry too  much - different things 1  1   Trouble relaxing 0  1   Restless 0  0   Easily annoyed or irritable 0  0   Afraid - awful might happen 0  0   Total GAD 7 Score 3  4   Anxiety Difficulty Somewhat difficult  Somewhat difficult      Information is confidential and restricted. Go to Review Flowsheets to unlock data.    ASSESSMENT/PLAN:  Acute laryngitis Assessment & Plan: Viral laryngitis with hoarseness and voice loss, likely viral etiology. Negative strep test, clear lungs. 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 - Rapid strep negative - Covid negative - Prescribed steroids to reduce inflammation and improve hoarseness. - Advised against whispering to prevent worsening. - Considered providing a few days off work for voice rest if necessary. - Has ENT appointment tomorow  Orders: -     predniSONE ; Take 1 tablet (20 mg total) by mouth daily with breakfast for 5 days.  Dispense: 5 tablet; Refill: 0  Acute cough Assessment & Plan: Cough with green phlegm, likely viral. No signs of bacterial infection. Cough not severe, occurs mostly when sitting. - Prescribed Flonase  or Astelin  nasal spray for nasal congestion and postnasal drip. - Checked COVID-19 test to rule out infection. - Tessalon  perles as needed.  At end of visit patient stated she will not pick up, do not work. - Education provided for treatment of acute cough  Orders: -     POC COVID-19 BinaxNow -     POCT rapid strep A -     Fluticasone  Propionate; Place 2 sprays into both nostrils 2 (two) times daily.  Dispense: 16 g; Refill: 1 -     Azelastine  HCl; Place 2 sprays into both nostrils 2 (two) times daily. Use in each nostril as directed  Dispense: 30 mL; Refill: 1 -     Benzonatate ; Take 1 capsule (100 mg total) by mouth 3 (three) times daily as needed for cough.  Dispense: 20 capsule; Refill: 0  Sore throat -     POCT rapid strep A    PDMP reviewed  Return if symptoms worsen or fail to improve, for  PCP.  Valli Gaw, MD

## 2024-02-26 ENCOUNTER — Other Ambulatory Visit: Payer: Self-pay

## 2024-02-26 ENCOUNTER — Ambulatory Visit
Admission: EM | Admit: 2024-02-26 | Discharge: 2024-02-26 | Disposition: A | Discharge status: Home / Self Care | Attending: Emergency Medicine | Admitting: Emergency Medicine

## 2024-02-26 ENCOUNTER — Encounter: Payer: Self-pay | Admitting: *Deleted

## 2024-02-26 DIAGNOSIS — J0100 Acute maxillary sinusitis, unspecified: Secondary | ICD-10-CM | POA: Diagnosis not present

## 2024-02-26 MED ORDER — CEFDINIR 300 MG PO CAPS: 300.0000 mg | ORAL_CAPSULE | Freq: Two times a day (BID) | ORAL | 0 refills | Status: DC

## 2024-02-26 NOTE — Discharge Instructions (Signed)
 Begin cefdinir twice daily for 7 days for bacterial coverage which most likely will is causing symptoms to linger    You can take Tylenol  and/or Ibuprofen  as needed for fever reduction and pain relief.   For cough: honey 1/2 to 1 teaspoon (you can dilute the honey in water or another fluid).  You can also use guaifenesin and dextromethorphan for cough. You can use a humidifier for chest congestion and cough.  If you don't have a humidifier, you can sit in the bathroom with the hot shower running.      For sore throat: try warm salt water gargles, cepacol lozenges, throat spray, warm tea or water with lemon/honey, popsicles or ice, or OTC cold relief medicine for throat discomfort.   For congestion: take a daily anti-histamine like Zyrtec, Claritin, and a oral decongestant, such as pseudoephedrine.  You can also use Flonase  1-2 sprays in each nostril daily.   It is important to stay hydrated: drink plenty of fluids (water, gatorade/powerade/pedialyte, juices, or teas) to keep your throat moisturized and help further relieve irritation/discomfort.

## 2024-02-26 NOTE — ED Triage Notes (Signed)
 Pt reports she has hoarse voice. Pt took 5 days of steroids last dose today.
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
## 2024-02-27 ENCOUNTER — Encounter: Payer: Self-pay | Admitting: Family Medicine

## 2024-02-27 DIAGNOSIS — J029 Acute pharyngitis, unspecified: Secondary | ICD-10-CM | POA: Insufficient documentation

## 2024-02-27 DIAGNOSIS — R051 Acute cough: Secondary | ICD-10-CM | POA: Insufficient documentation

## 2024-02-27 DIAGNOSIS — J040 Acute laryngitis: Secondary | ICD-10-CM | POA: Insufficient documentation

## 2024-02-27 NOTE — Assessment & Plan Note (Addendum)
 Cough with green phlegm, likely viral. No signs of bacterial infection. Cough not severe, occurs mostly when sitting. - Prescribed Flonase  or Astelin  nasal spray for nasal congestion and postnasal drip. - Checked COVID-19 test to rule out infection. - Tessalon  perles as needed.  At end of visit patient stated she will not pick up, do not work. - Education provided for treatment of acute cough

## 2024-02-27 NOTE — Assessment & Plan Note (Addendum)
 Viral laryngitis with hoarseness and voice loss, likely viral etiology. Negative strep test, clear lungs. 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 - Rapid strep negative - Covid negative - Prescribed steroids to reduce inflammation and improve hoarseness. - Advised against whispering to prevent worsening. - Considered providing a few days off work for voice rest if necessary. - Has ENT appointment tomorow

## 2024-02-29 ENCOUNTER — Other Ambulatory Visit: Payer: Self-pay | Admitting: Family Medicine

## 2024-02-29 DIAGNOSIS — R051 Acute cough: Secondary | ICD-10-CM

## 2024-02-29 NOTE — Telephone Encounter (Signed)
 90 day supply requested. Is it okay to fill for 90 days?

## 2024-03-09 ENCOUNTER — Encounter: Payer: Self-pay | Admitting: Licensed Clinical Social Worker

## 2024-03-16 ENCOUNTER — Ambulatory Visit (INDEPENDENT_AMBULATORY_CARE_PROVIDER_SITE_OTHER): Admitting: Licensed Clinical Social Worker

## 2024-03-16 ENCOUNTER — Ambulatory Visit: Payer: BC Managed Care – PPO | Admitting: Internal Medicine

## 2024-03-16 DIAGNOSIS — F3342 Major depressive disorder, recurrent, in full remission: Secondary | ICD-10-CM

## 2024-03-16 DIAGNOSIS — F411 Generalized anxiety disorder: Secondary | ICD-10-CM | POA: Diagnosis not present

## 2024-03-16 DIAGNOSIS — F4310 Post-traumatic stress disorder, unspecified: Secondary | ICD-10-CM | POA: Diagnosis not present

## 2024-03-16 NOTE — Progress Notes (Signed)
   THERAPIST PROGRESS NOTE  Session Time: 3-3:57pm  Participation Level: Active  Behavioral Response: CasualAlertEuthymic  Type of Therapy: Individual Therapy  Treatment Goals addressed:  Elimination of maladaptive behaviors and thinking patterns which interfere with resolution of trauma as evidenced by self report.  Goal: LTG: Develop and implement effective coping skills to carry out normal responsibilities and participate constructively in relationships as evidenced by self report.  Goal: LTG: Pt reports:  feel like I have a little more control over my own emotions and feel like I'm a little more balanced.    Goal: STG: [name] will identify internal and external stimuli that trigger PTSD symptoms   ProgressTowards Goals: Progressing   Interventions: Assertiveness Training, Supportive, and Other: EMDR   Summary: Selena [name] is a 58 y.o. female who presents with symptoms of anxiety. Patient identifies symptoms to include uncontrollable worry, negative self affect, compulsions, isolated behaviors.Pt was oriented times 5. Pt was cooperative and engaged. Pt denies SI/HI/AVH.    Patient utilized therapeutic space to continue EMDR processing distressing memory with her relatives.  Patient identified negative cognition I am unsafe.  Patient identified connecting emotions to current dynamics unfolding with her mother.  Reflected on healthy boundaries she can begin to establish with her mother and efforts to prevent herself from overexerting her energy.  Identified significance of establishing specific roles between daughter and parent relationships.  Suicidal/Homicidal: Nowithout intent/plan  Therapist Response: Cln utilized active and supportive reflection to create a safe environment for patient to process recent life stressors. Clinician assessed for current symptoms, stressors, safety since last session. Continued EMDR by processing patient's target sequence plan.   Plan:  Return again in 2 weeks.  Diagnosis: GAD (generalized anxiety disorder)  Recurrent major depressive disorder, in full remission (HCC)  PTSD (post-traumatic stress disorder)   Collaboration of Care: AEB psychiatrist can access notes and cln. Will review psychiatrists' notes. Check in with the patient and will see LCSW per availability. Patient agreed with treatment recommendations.   Patient/Guardian was advised Release of Information must be obtained prior to any record release in order to collaborate their care with an outside provider. Patient/Guardian was advised if they have not already done so to contact the registration department to sign all necessary forms in order for us  to release information regarding their care.   Consent: Patient/Guardian gives verbal consent for treatment and assignment of benefits for services provided during this visit. Patient/Guardian expressed understanding and agreed to proceed.   Marvin Slot, LCSW 03/16/2024

## 2024-03-20 ENCOUNTER — Ambulatory Visit

## 2024-03-28 ENCOUNTER — Ambulatory Visit: Payer: Self-pay | Admitting: Neurology

## 2024-04-13 ENCOUNTER — Ambulatory Visit (INDEPENDENT_AMBULATORY_CARE_PROVIDER_SITE_OTHER): Admitting: Licensed Clinical Social Worker

## 2024-04-13 DIAGNOSIS — F4310 Post-traumatic stress disorder, unspecified: Secondary | ICD-10-CM

## 2024-04-13 DIAGNOSIS — F3342 Major depressive disorder, recurrent, in full remission: Secondary | ICD-10-CM | POA: Diagnosis not present

## 2024-04-13 DIAGNOSIS — F411 Generalized anxiety disorder: Secondary | ICD-10-CM | POA: Diagnosis not present

## 2024-04-13 NOTE — Progress Notes (Signed)
 THERAPIST PROGRESS NOTE  Progress Review  Session Time: 11-11:53am  Participation Level: Active  Behavioral Response: CasualAlertEuthymic  Type of Therapy: Individual Therapy  Treatment Goals addressed:  Active     BH CCP Acute or Chronic Trauma Reaction     LTG: Elimination of maladaptive behaviors and thinking patterns which interfere with resolution of trauma as evidenced by self report.  (Progressing)     Start:  09/27/23    Expected End:  07/14/24         LTG: Develop and implement effective coping skills to carry out normal responsibilities and participate constructively in relationships as evidenced by self report.  (Progressing)     Start:  09/27/23    Expected End:  07/14/24         LTG: Pt reports:  feel like I have a little more control over my own emotions and feel like I'm a little more balanced.  (Progressing)     Start:  09/27/23    Expected End:  07/14/24      04/13/24: [name] she is able to utilize her logical mind to challenge trauma responses instead of believing everything of her emotional brain tells her.       STG: Zira will identify internal and external stimuli that trigger PTSD symptoms (Progressing)     Start:  09/27/23    Expected End:  07/14/24      04/13/24: I am becoming aware that I notice around birthday and holidays I start to think about things more and I have more thins remind me of my ex more. Reports she has been able to catch herself with these triggers and increased hypervigilance. Shares improvements with her ability to cope.        STG: Kalayla will identify coping strategies to deal with trauma memories and the associated emotional reaction (Progressing)     Start:  09/27/23    Expected End:  07/14/24      04/13/24: Shares improved ability to utilize coping skills.       Cooperate with trauma-focused psychotherapy techniques to reduce emotional reaction to the traumatic event      Start:  09/27/23         Work  with [name] to construct a list of the situations, people, & places that [place] evoke the most distressing symptoms; suggest that they keep a journal of instances of stress being triggered     Start:  09/27/23         Teach [name] coping strategies (e.g., writing down thoughts and feelings in a journal; taking deep, slow breaths; calling a support person to talk about memories) to deal with trauma memories and sudden emotional reactions without becoming emotionally     Start:  09/27/23         Educate [name] on trauma influenced cognitive distortions     Start:  09/27/23         Encourage Elbia to identify 2 trauma related cognitive distortions     Start:  09/27/23           Self Esteem:         STG - Patient will identify 3 ways to improve self-esteem within 5 recreation therapy group sessions (Progressing)     Start:  09/27/23    Expected End:  07/14/24            LTG: Pt reports: I want to feel empowered again. (Progressing)     Start:  09/27/23    Expected  End:  07/14/24      04/13/24: I feel better since I made the decision about work. I don't know that I've done as much outside of work that I've wanted.       Coping Skills      Start:  09/27/23       Will Work with patient to decrease the frequency of negative self-descriptive statements and increase the frequency of positive self- descriptive statements using CBT/DBT/REBT techniques per patient self report 3 out of 5 documented sessions. Some of the techniques that will be used will be CBT, positive affirmations, role playing, modeling, homework and journaling.           ProgressTowards Goals: Progressing  Interventions: CBT, Strength-based, Supportive, and Reframing  Summary: Kizzy Olafson is a 58 y.o. female who presents with symptoms of anxiety. Patient identifies symptoms to include uncontrollable worry, negative self affect, compulsions, isolated behaviors.Pt was oriented times 5.  Pt was cooperative and engaged. Pt denies SI/HI/AVH.   Cln utilized the first half of session to review patients progress. See progress notes documented above.   The clinician readministered the PHQ-9 and GAD-7 assessments. The patient's anxiety scores decreased from 10 to 3, while her depression scores reduced from 10 to 8. She expressed feeling "all over the place" due to the lack of school routine, which she believes contributes to her depression being higher than she would like. The patient mentioned feeling like she is wasting her time and described being out of sorts because of not having a structured routine. She reported a desire to clean her home but stated that anxiety has been interfering with her ability to start, saying, I'm overwhelmed because the more I clean, the more stuff gets in the way.  The patient identified increased stressors related to her upcoming birthday, noting that this serves as a trigger connected to her trauma history from previous life experiences. The clinician employed Socratic questioning to help the patient identify coping mechanisms she can use to regulate her emotional responses on that day. The clinician also worked with her to develop practical solutions, such as setting a 2-hour timer to manage feelings of overwhelm while cleaning her personal space. Additionally, they explored ways for the patient to establish healthy boundaries with those around her, incorporating assertive communication and "I feel, I need" statements.   Suicidal/Homicidal: Nowithout intent/plan  Therapist Response: Cln utilized active and supportive reflection to create a safe environment for patient to process recent life stressors. Clinician assessed for current symptoms, stressors, safety since last session. Clinician utilized Socratic questioning to challenge patient to identify coping mechanisms to improve coping with trauma triggers.  Clinician also worked with patient to identify  solutions she can establish for the goal of cleaning her personal space.  Explored ways in which patient can continue to establish healthy boundaries through Psychoeducation.  Plan: Return again in 3 weeks.  Diagnosis: GAD (generalized anxiety disorder)  PTSD (post-traumatic stress disorder)  Recurrent major depressive disorder, in full remission (HCC)   Collaboration of Care: AEB psychiatrist can access notes and cln. Will review psychiatrists' notes. Check in with the patient and will see LCSW per availability. Patient agreed with treatment recommendations.   Patient/Guardian was advised Release of Information must be obtained prior to any record release in order to collaborate their care with an outside provider. Patient/Guardian was advised if they have not already done so to contact the registration department to sign all necessary forms in order for us  to release information  regarding their care.   Consent: Patient/Guardian gives verbal consent for treatment and assignment of benefits for services provided during this visit. Patient/Guardian expressed understanding and agreed to proceed.   [name] Husband, LCSW 04/13/2024

## 2024-04-17 ENCOUNTER — Ambulatory Visit (INDEPENDENT_AMBULATORY_CARE_PROVIDER_SITE_OTHER): Admitting: Psychiatry

## 2024-04-17 ENCOUNTER — Encounter: Payer: Self-pay | Admitting: Psychiatry

## 2024-04-17 VITALS — BP 112/78 | HR 87 | Temp 98.4°F | Ht 62.0 in | Wt 139.6 lb

## 2024-04-17 DIAGNOSIS — F3342 Major depressive disorder, recurrent, in full remission: Secondary | ICD-10-CM | POA: Diagnosis not present

## 2024-04-17 DIAGNOSIS — F4310 Post-traumatic stress disorder, unspecified: Secondary | ICD-10-CM

## 2024-04-17 DIAGNOSIS — F411 Generalized anxiety disorder: Secondary | ICD-10-CM | POA: Diagnosis not present

## 2024-04-17 DIAGNOSIS — G4701 Insomnia due to medical condition: Secondary | ICD-10-CM | POA: Diagnosis not present

## 2024-04-17 MED ORDER — FLUOXETINE HCL 40 MG PO CAPS: 80.0000 mg | ORAL_CAPSULE | Freq: Every day | ORAL | 1 refills | Status: AC

## 2024-04-17 MED ORDER — BUSPIRONE HCL 10 MG PO TABS: 20.0000 mg | ORAL_TABLET | Freq: Two times a day (BID) | ORAL | 1 refills | Status: AC

## 2024-04-17 NOTE — Progress Notes (Signed)
 BH MD OP Progress Note  04/17/2024 9:40 AM Selena [name]  MRN:  [id number]  Chief Complaint:  Chief Complaint  Patient presents with   Follow-up   Anxiety   Depression   Medication Refill   Discussed the use of AI scribe software for clinical note transcription with the patient, who gave verbal consent to proceed.  History of Present Illness Selena [name] is a 58 year old Caucasian female, employed, divorced, currently lives in [place], has a history of PTSD, GAD, MDD, insomnia, history of gastric bypass surgery in 2017, history of pituitary adenoma, migraine headaches, gastroesophageal reflux disease, history of abnormal LFT was evaluated in office today.  She feels overwhelmed by the chaotic state of her home, which has become unsettling. Despite having been tidy in the past, she has struggled to maintain order since her second marriage, which introduced a science writer environment. After her husband and his children left, she has been unable to return to her previous level of tidiness, leading to embarrassment and a barrier to socializing at home.  Efforts to organize her home before the school year starts have been exhausting, often resulting in [name] mess despite her efforts. She has donated items to [employer] but still feels overwhelmed. She spends hours cleaning a room only to feel like no progress has been made.  She is currently taking clonazepam  as needed, fluoxetine  80 mg, and Buspar . She stopped taking hydroxyzine  after experiencing dizziness and falls. Clonazepam  has been used only twice since her last visit. Her sleep is reportedly fine, and she denies any thoughts of self-harm or harm to others.  Her appetite has increased slightly, attributed to being at home [name] during the summer and recent birthday celebrations. She has not been engaging in regular physical exercise but acknowledges having a bike at home that she could use.  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 A positive experience with a challenging student, recognized by her colleagues, contributed to her decision to remain in her current role.   Visit Diagnosis:    ICD-10-CM   1. PTSD (post-traumatic stress disorder)  F43.10 FLUoxetine  (PROZAC ) 40 MG capsule    busPIRone  (BUSPAR ) 10 MG tablet    2. GAD (generalized anxiety disorder)  F41.1 busPIRone  (BUSPAR ) 10 MG tablet    3. Recurrent major depressive disorder, in full remission (HCC)  F33.42     4. Insomnia due to medical condition  G47.01    Anxiety      Past Psychiatric History: I have reviewed past psychiatric history from progress note on 04/22/2022.  Past trials of Wellbutrin , Celexa , nortriptyline , Paxil , Klonopin , Requip , trazodone , Seroquel .  Past Medical History:  Past Medical History:  Diagnosis Date   Anemia, iron deficiency    Anxiety    Asthma    Colon polyps 2014   Complication of anesthesia    per patient report , woke during endoscopy in 2014 ; no issues with subsequent colonscopy also  in 2014    Costochondritis    srturggled wiht it since age 49 ; exacerbates with excessive movement of left arm; did phsycial therapy in 08-2017 and hasnt had issues with it since    Depression    Eating disorder    Binge eating   Fibromyalgia    Gallstones    GERD (gastroesophageal reflux disease)    Heart murmur    at  birth; resolved    Hyperlipidemia    Hypertension    was due to birth control  ,  as soon as they took it out, it went away       Past Surgical History:  Procedure Laterality Date   CHOLECYSTECTOMY     COLONOSCOPY     [facility] dr debrah    COLONOSCOPY WITH PROPOFOL  N/A 08/01/2021   Procedure: COLONOSCOPY WITH PROPOFOL ;  Surgeon: Janalyn Keene NOVAK, MD;  Location: [facility];  Service: Endoscopy;  Laterality: N/A;    EVALUATION UNDER ANESTHESIA WITH HEMORRHOIDECTOMY N/A 06/24/2018   Procedure: EXAM UNDER ANESTHESIA WITH EXTERNAL HEMORRHOIDECTOMY;  Surgeon: Gladis Cough, MD;  Location: [facility];  Service: General;  Laterality: N/A;   EXPLORATORY LAPAROTOMY  2000   for infertility   GANGLION CYST EXCISION     GASTRIC ROUX-EN-Y N/A 01/24/2018   Procedure: LAPAROSCOPIC ROUX-EN-Y GASTRIC BYPASS WITH UPPER ENDOSCOPY;  Surgeon: Gladis Cough, MD;  Location: [facility];  Service: General;  Laterality: N/A;   SPHINCTEROTOMY N/A 06/24/2018   Procedure: LATERAL INTERNAL SPHINCTEROTOMY;  Surgeon: Gladis Cough, MD;  Location: [facility];  Service: General;  Laterality: N/A;   TONSILLECTOMY  1989    Family Psychiatric History: I have reviewed family psychiatric history from progress note on 04/22/2022.  Family History:  Family History  Problem Relation Age of Onset   Hyperlipidemia Mother    Heart disease Mother        Atrial fibrilation   Cancer Mother        ? Melanoma   Atrial fibrillation Mother    Depression Father    Bipolar disorder Father    Diabetes Father    Mental illness Father        Bipolar   Leukemia Father 9       MDS< then leukemia    Depression Sister    Cancer Sister        Clear cell sarcoma in leg   Hypothyroidism Sister    Arrhythmia Maternal Grandfather    Hypertension Maternal Grandmother    Heart disease Maternal Grandmother        Afib   Arrhythmia Maternal Grandmother    Arthritis Paternal Grandmother    ADD / ADHD Nephew    Anxiety disorder Nephew    ADD / ADHD Son    Autism spectrum disorder Son    Colon cancer Neg Hx    Rectal cancer Neg Hx    Pancreatic cancer Neg Hx     Social History: I have reviewed social history from progress note on 04/22/2022. Social History   Socioeconomic History   Marital status: Divorced    Spouse name: Not on file   Number of children: 1   Years of education: Not on file   Highest education level: Master's degree (e.g., MA, MS, MEng,  MEd, MSW, MBA)  Occupational History   Occupation: Teaches preschool    Employer: [employer]  Tobacco Use   Smoking status: Never    Passive exposure: Never   Smokeless tobacco: Never  Vaping Use   Vaping status: Never Used  Substance and Sexual Activity   Alcohol use: Not Currently    Comment: occasional   Drug use: No   Sexual activity: Yes    Birth control/protection: I.U.D.  Other Topics Concern   Not on file  Social History Narrative   2 Step children; asthma, ADHD   Social Drivers of Health   Financial Resource Strain: Not on file  Food  Insecurity: Not on file  Transportation Needs: Not on file  Physical Activity: Not on file  Stress: Not on file  Social Connections: Not on file    Allergies:  Allergies  Allergen Reactions   Fish Allergy Nausea And Vomiting   Augmentin  [Amoxicillin -Pot Clavulanate] Diarrhea    Can take regular amoxicillin  with no issues Has patient had a PCN reaction causing immediate rash, facial/tongue/throat swelling, SOB or lightheadedness with hypotension: No Has patient had a PCN reaction causing severe rash involving mucus membranes or skin necrosis: No Has patient had a PCN reaction that required hospitalization: No Has patient had a PCN reaction occurring within the last 10 years: Yes--but DIARRHEA ONLY If all of the above answers are NO, then may proceed with Cephalosporin us    Citalopram  Other (See Comments)    Tachycardia    Egg-Derived Products Diarrhea and Nausea And Vomiting   Requip  [Ropinirole ]     Not tolerated    Sulfonamide Derivatives Itching    Metabolic Disorder Labs: Lab Results  Component Value Date   HGBA1C 5.6 12/30/2023   Lab Results  Component Value Date   PROLACTIN 5.7 01/16/2022   PROLACTIN 7.5 09/05/2015   Lab Results  Component Value Date   CHOL 158 06/11/2021   TRIG 68.0 06/11/2021   HDL 58.30 06/11/2021   CHOLHDL 3 06/11/2021   VLDL 13.6 06/11/2021   LDLCALC 86 06/11/2021    LDLCALC 89 04/05/2019   Lab Results  Component Value Date   TSH 0.95 12/30/2023   TSH 0.88 09/09/2023    Therapeutic Level Labs: No results found for: LITHIUM No results found for: VALPROATE No results found for: CBMZ  Current Medications: Current Outpatient Medications  Medication Sig Dispense Refill   azelastine  (ASTELIN ) 0.1 % nasal spray Place 2 sprays into both nostrils 2 (two) times daily. Use in each nostril as directed 30 mL 1   benzonatate  (TESSALON  PERLES) 100 MG capsule Take 1 capsule (100 mg total) by mouth 3 (three) times daily as needed for cough. 20 capsule 0   calcium gluconate 500 MG tablet Take 1 tablet by mouth 2 (two) times daily.      cefdinir  (OMNICEF ) 300 MG capsule Take 1 capsule (300 mg total) by mouth 2 (two) times daily. 14 capsule 0   clonazePAM  (KLONOPIN ) 1 MG tablet Take 0.5-1 tablets (0.5-1 mg total) by mouth as directed. Take only for severe anxiety attacks , once daily as needed.Please limit use 15 tablet 0   fluticasone  (FLONASE ) 50 MCG/ACT nasal spray Place 2 sprays into both nostrils 2 (two) times daily. 16 g 1   levonorgestrel (MIRENA) 20 MCG/24HR IUD 1 Intra Uterine Device (1 each total) by Intrauterine route once. 1 each 0   mirtazapine  (REMERON ) 7.5 MG tablet Take 1 tablet (7.5 mg total) by mouth at bedtime as needed. For sleep 90 tablet 0   Multiple Vitamins-Iron (MULTI-VITAMIN/IRON PO) Take 1 tablet by mouth daily.      omeprazole  (PRILOSEC  OTC) 20 MG tablet Take 20 mg by mouth daily before breakfast.      rizatriptan  (MAXALT ) 10 MG tablet TAKE 1 TABLET BY MOUTH AS NEEDED FOR MIGRAINE. MAY REPEAT IN 2 HOURS IF NEEDED 10 tablet 2   topiramate  (TOPAMAX ) 100 MG tablet Take 1 tablet (100 mg total) by mouth daily. 90 tablet 0   Vitamin D , Ergocalciferol , (DRISDOL ) 1.25 MG (50000 UNIT) CAPS capsule TAKE 1 CAPSULE BY MOUTH ONE TIME PER WEEK 12 capsule 3   busPIRone  (BUSPAR ) 10 MG tablet  Take 2 tablets (20 mg total) by mouth 2 (two) times daily.  360 tablet 1   FLUoxetine  (PROZAC ) 40 MG capsule Take 2 capsules (80 mg total) by mouth daily. 180 capsule 1   No current facility-administered medications for this visit.     Musculoskeletal: Strength & Muscle Tone: within normal limits Gait & Station: normal Patient leans: N/A  Psychiatric Specialty Exam: Review of Systems  Psychiatric/Behavioral:  The patient is nervous/anxious.     Blood pressure 112/78, pulse 87, temperature 98.4 F (36.9 C), temperature source Temporal, height 5' 2 (1.575 m), weight 139 lb 9.6 oz (63.3 kg), SpO2 100%.Body mass index is 25.53 kg/m.  General Appearance: Casual  Eye Contact:  Fair  Speech:  Clear and Coherent  Volume:  Normal  Mood:  Anxious  Affect:  Appropriate  Thought Process:  Goal Directed and Descriptions of Associations: Intact  Orientation:  Full (Time, Place, and Person)  Thought Content: Logical   Suicidal Thoughts:  No  Homicidal Thoughts:  No  Memory:  Immediate;   Fair Recent;   Fair Remote;   Fair  Judgement:  Fair  Insight:  Fair  Psychomotor Activity:  Normal  Concentration:  Concentration: Fair and Attention Span: Fair  Recall:  [employer] of Knowledge: Fair  Language: Fair  Akathisia:  No  Handed:  Right  AIMS (if indicated): not done  Assets:  Manufacturing Systems Engineer Desire for Improvement Housing Social Support Transportation  ADL's:  Intact  Cognition: WNL  Sleep:  Fair   Screenings: GAD-7    Garment/textile Technologist Visit from 04/17/2024 in [facility] Office Visit from 02/22/2024 in [facility] Visit from 02[facility]14/2024 in [facility] Office Visit from 01/19/2024 in [facility] [place] [employer] from 09/27/2023 in [facility]  Total GAD-7 Score 4 3 7 4 10    PHQ2-9    Flowsheet Row Office Visit from 04/17/2024 in  [facility] Office Visit from 02/22/2024 in [facility] Visit from 02/14/2024 in [facility] Office Visit from 01/19/2024 in [facility] Visit from 12/30/2023 in [facility] [place] [facility] [employer] [facility]  PHQ-2 Total Score 0 2 2 3  0  PHQ-9 Total Score -- 7 8 10  0   Flowsheet Row Office Visit from 04/17/2024 in [facility] UC from 02/26/2024 in [facility] Visit from 02/14/2024 in [facility]  C-SSRS RISK CATEGORY No Risk No Risk No Risk     Assessment and Plan: Selena [name] is a 58 year old Caucasian female who has a history of PTSD, GAD was evaluated in office today.  Discussed assessment and plan as noted below.  Posttraumatic stress disorder-improving MDD in remission Generalized anxiety disorder-stable Insomnia-stable Currently denies any significant trauma related symptoms.  She is also on her summer break which does help.  She continues to be motivated to stay in therapy. Continue Fluoxetine  80 mg daily Continue BuSpar  20 mg twice daily Continue Clonazepam  0.5-1 mg as needed Continue Mirtazapine  7.5 mg at bedtime as needed Continue Hydroxyzine  25 to 50 mg daily as needed. Continue psychotherapy sessions with Ms. [name] Husband Reviewed [place] P[place] AWARxE Continue hydroxyzine  for side effects.  Follow-up Follow-up in clinic in 2 months or sooner if  needed.   Collaboration of Care: Collaboration of Care: Referral or follow-up with counselor/therapist AEB and ways to continue psychotherapy sessions with Ms. Perkins.  Patient/Guardian was advised Release of Information must be obtained prior to any record release in order to collaborate their care with an outside provider. Patient/Guardian  was advised if they have not already done so to contact the registration department to sign all necessary forms in order for us  to release information regarding their care.   Consent: Patient/Guardian gives verbal consent for treatment and assignment of benefits for services provided during this visit. Patient/Guardian expressed understanding and agreed to proceed.   This note was generated in part or whole with voice recognition software. Voice recognition is usually quite accurate but there are transcription errors that can and very often do occur. I apologize for any typographical errors that were not detected and corrected.    Selena More, MD 04/19/2024, 4:16 AM

## 2024-04-27 ENCOUNTER — Other Ambulatory Visit (HOSPITAL_COMMUNITY): Payer: Self-pay | Admitting: Psychiatry

## 2024-04-27 DIAGNOSIS — G4701 Insomnia due to medical condition: Secondary | ICD-10-CM

## 2024-04-28 ENCOUNTER — Encounter: Payer: Self-pay | Admitting: Internal Medicine

## 2024-04-28 ENCOUNTER — Ambulatory Visit: Admitting: Internal Medicine

## 2024-04-28 VITALS — BP 97/62 | HR 86 | Temp 97.9°F | Resp 20 | Ht 62.0 in | Wt 138.0 lb

## 2024-04-28 DIAGNOSIS — Z9884 Bariatric surgery status: Secondary | ICD-10-CM

## 2024-04-28 DIAGNOSIS — K760 Fatty (change of) liver, not elsewhere classified: Secondary | ICD-10-CM | POA: Diagnosis not present

## 2024-04-28 DIAGNOSIS — K909 Intestinal malabsorption, unspecified: Secondary | ICD-10-CM

## 2024-04-28 DIAGNOSIS — E559 Vitamin D deficiency, unspecified: Secondary | ICD-10-CM

## 2024-04-28 DIAGNOSIS — K146 Glossodynia: Secondary | ICD-10-CM

## 2024-04-28 DIAGNOSIS — Z1211 Encounter for screening for malignant neoplasm of colon: Secondary | ICD-10-CM

## 2024-04-28 DIAGNOSIS — I700 Atherosclerosis of aorta: Secondary | ICD-10-CM

## 2024-04-28 DIAGNOSIS — R55 Syncope and collapse: Secondary | ICD-10-CM | POA: Diagnosis not present

## 2024-04-28 DIAGNOSIS — E782 Mixed hyperlipidemia: Secondary | ICD-10-CM

## 2024-04-28 DIAGNOSIS — E876 Hypokalemia: Secondary | ICD-10-CM | POA: Diagnosis not present

## 2024-04-28 DIAGNOSIS — E538 Deficiency of other specified B group vitamins: Secondary | ICD-10-CM

## 2024-04-28 DIAGNOSIS — I471 Supraventricular tachycardia, unspecified: Secondary | ICD-10-CM

## 2024-04-28 DIAGNOSIS — I10 Essential (primary) hypertension: Secondary | ICD-10-CM

## 2024-04-28 MED ORDER — TOPIRAMATE 100 MG PO TABS: 100.0000 mg | ORAL_TABLET | Freq: Every day | ORAL | 1 refills | Status: DC
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
## 2024-04-28 NOTE — Assessment & Plan Note (Addendum)
 Epsiodes occurred after receiving 2 vaccines in a fasting state.  Etiology was likely VASOVAGAL WITH DEHYDRATION CONTRIBUTING.  ADVISED TO INCREASE SALT INTAKE WITH ELECTOLYTE REPLACEMENT DRINKS OVER THE WEEK END . RETURN NEXT WEEK FOR LABS.

## 2024-04-28 NOTE — Patient Instructions (Signed)
 YOU APPEAR DEHYDRATED,  WHICH IS LIKELY THE CAUSE OF YOUR FAINTING SPELL   HOWEVER IF IT HAPPENS AGAIN, I RECOMMEND YOU GO TO THE ER TO HAVE YOUR HEART CHECKED OUT  INCREASE YOUR SODIUM INTAKE TODAY WITH GATORADE   RETURN NEXT WEEK FOR LABS

## 2024-04-30 DIAGNOSIS — K909 Intestinal malabsorption, unspecified: Secondary | ICD-10-CM | POA: Insufficient documentation
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
## 2024-04-30 NOTE — Assessment & Plan Note (Signed)
 Secondary to bariatric surgery.  Checking vitamin levels.

## 2024-04-30 NOTE — Assessment & Plan Note (Signed)
 Resolved with weight loss.

## 2024-04-30 NOTE — Assessment & Plan Note (Signed)
 Mild , distal noted on 2020 CT abdomen.  Her untreated LDL was 86 in Sept 2022.  No treatment needed . Repeat lipids are due   Lab Results  Component Value Date   CHOL 158 06/11/2021   HDL 58.30 06/11/2021   LDLCALC 86 06/11/2021   LDLDIRECT 171 (H) 05/21/2016   TRIG 68.0 06/11/2021   CHOLHDL 3 06/11/2021

## 2024-05-02 ENCOUNTER — Other Ambulatory Visit (INDEPENDENT_AMBULATORY_CARE_PROVIDER_SITE_OTHER)

## 2024-05-02 DIAGNOSIS — E559 Vitamin D deficiency, unspecified: Secondary | ICD-10-CM | POA: Diagnosis not present

## 2024-05-02 DIAGNOSIS — R55 Syncope and collapse: Secondary | ICD-10-CM | POA: Diagnosis not present

## 2024-05-02 DIAGNOSIS — K909 Intestinal malabsorption, unspecified: Secondary | ICD-10-CM

## 2024-05-02 DIAGNOSIS — E876 Hypokalemia: Secondary | ICD-10-CM | POA: Diagnosis not present

## 2024-05-02 DIAGNOSIS — K760 Fatty (change of) liver, not elsewhere classified: Secondary | ICD-10-CM

## 2024-05-02 DIAGNOSIS — E538 Deficiency of other specified B group vitamins: Secondary | ICD-10-CM

## 2024-05-02 DIAGNOSIS — E782 Mixed hyperlipidemia: Secondary | ICD-10-CM

## 2024-05-02 LAB — CBC WITH DIFFERENTIAL/PLATELET
Basophils Absolute: 0 K/uL (ref 0.0–0.1)
Basophils Relative: 1.1 % (ref 0.0–3.0)
Eosinophils Absolute: 0.1 K/uL (ref 0.0–0.7)
Eosinophils Relative: 2.3 % (ref 0.0–5.0)
HCT: 37.8 % (ref 36.0–46.0)
Hemoglobin: 12.4 g/dL (ref 12.0–15.0)
Lymphocytes Relative: 41.2 % (ref 12.0–46.0)
Lymphs Abs: 1.7 K/uL (ref 0.7–4.0)
MCHC: 32.7 g/dL (ref 30.0–36.0)
MCV: 88.7 fl (ref 78.0–100.0)
Monocytes Absolute: 0.4 K/uL (ref 0.1–1.0)
Monocytes Relative: 9.2 % (ref 3.0–12.0)
Neutro Abs: 1.9 K/uL (ref 1.4–7.7)
Neutrophils Relative %: 46.2 % (ref 43.0–77.0)
Platelets: 281 K/uL (ref 150.0–400.0)
RBC: 4.27 Mil/uL (ref 3.87–5.11)
RDW: 13.4 % (ref 11.5–15.5)
WBC: 4.2 K/uL (ref 4.0–10.5)

## 2024-05-02 LAB — COMPREHENSIVE METABOLIC PANEL WITH GFR
ALT: 21 U/L (ref 0–35)
AST: 23 U/L (ref 0–37)
Albumin: 4.1 g/dL (ref 3.5–5.2)
Alkaline Phosphatase: 71 U/L (ref 39–117)
BUN: 13 mg/dL (ref 6–23)
CO2: 22 meq/L (ref 19–32)
Calcium: 8.9 mg/dL (ref 8.4–10.5)
Chloride: 104 meq/L (ref 96–112)
Creatinine, Ser: 0.85 mg/dL (ref 0.40–1.20)
GFR: 76.79 mL/min (ref >60.00)
Glucose, Bld: 83 mg/dL (ref 70–99)
Potassium: 3.7 meq/L (ref 3.5–5.1)
Sodium: 136 meq/L (ref 135–145)
Total Bilirubin: 0.4 mg/dL (ref 0.2–1.2)
Total Protein: 6.2 g/dL (ref 6.0–8.3)

## 2024-05-02 LAB — VITAMIN D 25 HYDROXY (VIT D DEFICIENCY, FRACTURES): VITD: 39.7 ng/mL (ref 30.00–100.00)

## 2024-05-02 LAB — MAGNESIUM: Magnesium: 2.1 mg/dL (ref 1.5–2.5)

## 2024-05-02 LAB — VITAMIN B12: Vitamin B-12: 1185 pg/mL — ABNORMAL HIGH (ref 211–911)

## 2024-05-02 LAB — CORTISOL: Cortisol, Plasma: 7.1 ug/dL

## 2024-05-02 LAB — FERRITIN: Ferritin: 10.7 ng/mL (ref 10.0–291.0)

## 2024-05-03 LAB — T4 AND TSH
T4, Total: 6.1 ug/dL (ref 4.5–12.0)
TSH: 0.936 u[IU]/mL (ref 0.450–4.500)

## 2024-05-03 LAB — FOLATE, RBC AND SERUM
Folate, Hemolysate: 305 ng/mL
Folate, RBC: 782 ng/mL (ref >498)
Folate: 13.7 ng/mL (ref >3.0)
Hematocrit: 39 % (ref 34.0–46.6)

## 2024-05-06 LAB — VITAMIN K1, SERUM: Vitamin K: 111 pg/mL — ABNORMAL LOW (ref 130–1500)

## 2024-05-07 ENCOUNTER — Ambulatory Visit: Payer: Self-pay | Admitting: Internal Medicine

## 2024-05-30 ENCOUNTER — Ambulatory Visit (INDEPENDENT_AMBULATORY_CARE_PROVIDER_SITE_OTHER): Admitting: Licensed Clinical Social Worker

## 2024-05-30 DIAGNOSIS — F3342 Major depressive disorder, recurrent, in full remission: Secondary | ICD-10-CM | POA: Diagnosis not present

## 2024-05-30 DIAGNOSIS — F411 Generalized anxiety disorder: Secondary | ICD-10-CM | POA: Diagnosis not present

## 2024-05-30 DIAGNOSIS — F4310 Post-traumatic stress disorder, unspecified: Secondary | ICD-10-CM | POA: Diagnosis not present
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
## 2024-06-06 LAB — VITAMIN E
Gamma-Tocopherol (Vit E): <1 mg/L (ref <4.4)
Vitamin E (Alpha Tocopherol): 11.5 mg/L (ref 5.7–19.9)

## 2024-06-06 LAB — VITAMIN B1: Vitamin B1 (Thiamine): 16 nmol/L (ref 8–30)

## 2024-06-06 LAB — ZINC: Zinc: 64 ug/dL (ref 60–130)

## 2024-06-06 LAB — VITAMIN A

## 2024-06-06 LAB — VITAMIN B6: Vitamin B6: 8.9 ng/mL (ref 2.1–21.7)

## 2024-06-18 ENCOUNTER — Ambulatory Visit
Admission: EM | Admit: 2024-06-18 | Discharge: 2024-06-18 | Disposition: A | Discharge status: Home / Self Care | Attending: Emergency Medicine | Admitting: Emergency Medicine

## 2024-06-18 ENCOUNTER — Encounter: Payer: Self-pay | Admitting: Emergency Medicine

## 2024-06-18 DIAGNOSIS — U071 COVID-19: Secondary | ICD-10-CM

## 2024-06-18 MED ORDER — PAXLOVID (300/100) 20 X 150 MG & 10 X 100MG PO TBPK: 3.0000 | ORAL_TABLET | Freq: Two times a day (BID) | ORAL | 0 refills | Status: AC
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
## 2024-06-18 NOTE — Discharge Instructions (Addendum)
 Covid 19 is a virus and should steadily improve in time it can take up to 7 to 10 days before you truly start to see a turnaround however things will get better    Per the CDC you will need to quarantine and to your 24 hours without fever, if no fever may continue activity wearing mask  Takes Paxlovid  twice daily for 5 days to reduce the amount of virus in the body helping to minimize symptoms and ideally the timeline that you are sick, does not fully take away your illness  You can take Tylenol  and/or Ibuprofen  as needed for fever reduction and pain relief.   For cough: honey 1/2 to 1 teaspoon (you can dilute the honey in water or another fluid).  You can also use guaifenesin and dextromethorphan for cough. You can use a humidifier for chest congestion and cough.  If you don't have a humidifier, you can sit in the bathroom with the hot shower running.      For sore throat: try warm salt water gargles, cepacol lozenges, throat spray, warm tea or water with lemon/honey, popsicles or ice, or OTC cold relief medicine for throat discomfort.   For congestion: take a daily anti-histamine like Zyrtec, Claritin, and a oral decongestant, such as pseudoephedrine.  You can also use Flonase  1-2 sprays in each nostril daily.   It is important to stay hydrated: drink plenty of fluids (water, gatorade/powerade/pedialyte, juices, or teas) to keep your throat moisturized and help further relieve irritation/discomfort.

## 2024-06-18 NOTE — ED Triage Notes (Signed)
 Patient reports that she took a covid /flu and the results was positive for Covid on today. Patient took Theraflu daytime with no relief. Patient chills, body aches and nasal congestion that started yesterday. Rates pain 8/10.

## 2024-06-19 ENCOUNTER — Ambulatory Visit: Admitting: Licensed Clinical Social Worker

## 2024-06-21 ENCOUNTER — Telehealth (INDEPENDENT_AMBULATORY_CARE_PROVIDER_SITE_OTHER): Admitting: Psychiatry

## 2024-06-21 DIAGNOSIS — F411 Generalized anxiety disorder: Secondary | ICD-10-CM

## 2024-06-21 NOTE — Progress Notes (Signed)
 Unable to complete evaluation due to connection problem.  Patient advised to reschedule the appointment.

## 2024-07-20 ENCOUNTER — Ambulatory Visit (INDEPENDENT_AMBULATORY_CARE_PROVIDER_SITE_OTHER): Admitting: Licensed Clinical Social Worker

## 2024-07-20 DIAGNOSIS — F411 Generalized anxiety disorder: Secondary | ICD-10-CM | POA: Diagnosis not present

## 2024-07-20 DIAGNOSIS — F4310 Post-traumatic stress disorder, unspecified: Secondary | ICD-10-CM | POA: Diagnosis not present

## 2024-07-20 DIAGNOSIS — F3342 Major depressive disorder, recurrent, in full remission: Secondary | ICD-10-CM | POA: Diagnosis not present
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
## 2024-07-24 ENCOUNTER — Encounter: Payer: Self-pay | Admitting: Licensed Clinical Social Worker

## 2024-08-17 ENCOUNTER — Ambulatory Visit: Admitting: Psychiatry

## 2024-08-17 ENCOUNTER — Ambulatory Visit (INDEPENDENT_AMBULATORY_CARE_PROVIDER_SITE_OTHER): Admitting: Psychiatry

## 2024-08-17 ENCOUNTER — Other Ambulatory Visit: Payer: Self-pay

## 2024-08-17 ENCOUNTER — Encounter: Payer: Self-pay | Admitting: Psychiatry

## 2024-08-17 VITALS — BP 118/78 | HR 75 | Ht 62.0 in | Wt 136.8 lb

## 2024-08-17 DIAGNOSIS — F4310 Post-traumatic stress disorder, unspecified: Secondary | ICD-10-CM

## 2024-08-17 DIAGNOSIS — F411 Generalized anxiety disorder: Secondary | ICD-10-CM | POA: Diagnosis not present

## 2024-08-17 DIAGNOSIS — G4701 Insomnia due to medical condition: Secondary | ICD-10-CM

## 2024-08-17 DIAGNOSIS — F331 Major depressive disorder, recurrent, moderate: Secondary | ICD-10-CM

## 2024-08-17 DIAGNOSIS — F3342 Major depressive disorder, recurrent, in full remission: Secondary | ICD-10-CM

## 2024-08-17 MED ORDER — MIRTAZAPINE 7.5 MG PO TABS: 7.5000 mg | ORAL_TABLET | Freq: Every day | ORAL | 0 refills | Status: AC

## 2024-08-17 NOTE — Progress Notes (Unsigned)
 BH MD OP Progress Note  08/17/2024 4:06 PM Selena [name]  MRN:  [id number]  Chief Complaint:  Chief Complaint  Patient presents with   Follow-up   Anxiety   Depression   Medication Refill   Discussed the use of AI scribe software for clinical note transcription with the patient, who gave verbal consent to proceed.  History of Present Illness Selena [name] is a 58 year old Caucasian female, employed, divorced, currently lives in [place], has a history of PTSD, GAD, MDD, insomnia, history of gastric bypass surgery in 2017, history of pituitary adenoma, migraine headaches, gastroesophageal reflux disease, history of abnormal LFT was evaluated in office today for a follow-up appointment.  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 She describes feeling overwhelmed by the workload, citing extensive documentation requirements, limited planning and lunch time, and insufficient support from her assistant. She notes that her previous assistant provided significantly more help, and she reports that the current situation has made her job much more difficult.  Persistent fatigue affects her daily functioning, and she frequently feels tired and worn down. 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 She enjoys working with children and receives positive feedback from parents, yet emotional depletion and difficulty caring for herself remain concerns. She identifies burnout and emotional exhaustion, stating that her body feels fried and that she may need a quieter life.  Sleep disturbances disrupt her rest, including difficulty falling asleep, falling asleep early in the evening, waking after a short period, and being unable to return to sleep. She takes mirtazapine  at bedtime when  she cannot sleep and confirms that it does not cause dizziness. Her medication regimen includes fluoxetine  80 mg and buspirone  20 mg twice daily. She stopped taking hydroxyzine  due to dizziness and reports that she currently does not have a medication to take during the day when she feels off.  Ongoing engagement in therapy supports her, and her therapist encourages her to take more time for herself. She recently took a day off from school-related activities to relax, which she found helpful, but she continues to struggle with self-care and finding ways to improve her well-being.  She reports that her appetite remains stable, and she has not experienced any significant weight changes.  She denies any suicidality, homicidality or perceptual disturbances.   Visit Diagnosis:    ICD-10-CM   1. PTSD (post-traumatic stress disorder)  F43.10 mirtazapine  (REMERON ) 7.5 MG tablet    2. GAD (generalized anxiety disorder)  F41.1 mirtazapine  (REMERON ) 7.5 MG tablet    3. MDD (major depressive disorder), recurrent episode, moderate (HCC)  F33.1     4. Insomnia due to medical condition  G47.01    Anxiety      Past Psychiatric History: I have reviewed past psychiatric history from progress note on 04/22/2022.  Past trials of Wellbutrin , Celexa , nortriptyline , Paxil , Klonopin , Requip , trazodone , Seroquel .  Past Medical History:  Past Medical History:  Diagnosis Date   Anemia, iron deficiency    Anxiety    Asthma    Colon polyps 2014   Complication of anesthesia    per patient report , woke during endoscopy in 2014 ; no issues with subsequent colonscopy also  in 2014    Costochondritis    srturggled wiht it since age 81 ; exacerbates with excessive movement of left arm; did phsycial therapy in 08-2017 and hasnt  had issues with it since    Depression    Eating disorder    Binge eating   Fibromyalgia    Gallstones    GERD (gastroesophageal reflux disease)    Heart murmur    at birth; resolved     Hyperlipidemia    Hypertension    was due to birth control  ,  as soon as they took it out, it went away       Past Surgical History:  Procedure Laterality Date   CHOLECYSTECTOMY     COLONOSCOPY     [facility] dr debrah    COLONOSCOPY WITH PROPOFOL  N/A 08/01/2021   Procedure: COLONOSCOPY WITH PROPOFOL ;  Surgeon: Janalyn Keene NOVAK, MD;  Location: [facility];  Service: Endoscopy;  Laterality: N/A;   EVALUATION UNDER ANESTHESIA WITH HEMORRHOIDECTOMY N/A 06/24/2018   Procedure: EXAM UNDER ANESTHESIA WITH EXTERNAL HEMORRHOIDECTOMY;  Surgeon: Gladis Cough, MD;  Location: [facility];  Service: General;  Laterality: N/A;   EXPLORATORY LAPAROTOMY  2000   for infertility   GANGLION CYST EXCISION     GASTRIC ROUX-EN-Y N/A 01/24/2018   Procedure: LAPAROSCOPIC ROUX-EN-Y GASTRIC BYPASS WITH UPPER ENDOSCOPY;  Surgeon: Gladis Cough, MD;  Location: [facility];  Service: General;  Laterality: N/A;   SPHINCTEROTOMY N/A 06/24/2018   Procedure: LATERAL INTERNAL SPHINCTEROTOMY;  Surgeon: Gladis Cough, MD;  Location: [facility];  Service: General;  Laterality: N/A;   TONSILLECTOMY  1989    Family Psychiatric History: I have reviewed family psychiatric history from progress note on 04/22/2022.  Family History:  Family History  Problem Relation Age of Onset   Hyperlipidemia Mother    Heart disease Mother        Atrial fibrilation   Cancer Mother        ? Melanoma   Atrial fibrillation Mother    Depression Father    Bipolar disorder Father    Diabetes Father    Mental illness Father        Bipolar   Leukemia Father 70       MDS< then leukemia    Depression Sister    Cancer Sister        Clear cell sarcoma in leg   Hypothyroidism Sister    Arrhythmia Maternal Grandfather    Hypertension Maternal Grandmother    Heart disease Maternal Grandmother        Afib   Arrhythmia Maternal Grandmother    Arthritis Paternal Grandmother    ADD / ADHD Nephew    Anxiety disorder Nephew    ADD  / ADHD Son    Autism spectrum disorder Son    Colon cancer Neg Hx    Rectal cancer Neg Hx    Pancreatic cancer Neg Hx     Social History: I have reviewed social history from progress note on 04/22/2022. Social History   Socioeconomic History   Marital status: Divorced    Spouse name: Not on file   Number of children: 1   Years of education: Not on file   Highest education level: Master's degree (e.g., MA, MS, MEng, MEd, MSW, MBA)  Occupational History   Occupation: Teaches preschool    Employer: [employer]  Tobacco Use   Smoking status: Never    Passive exposure: Never   Smokeless tobacco: Never  Vaping Use   Vaping status: Never Used  Substance and Sexual Activity   Alcohol use: Not Currently    Comment: occasional   Drug use: No   Sexual activity: Yes  Birth control/protection: I.U.D.  Other Topics Concern   Not on file  Social History Narrative   2 Step children; asthma, ADHD   Social Drivers of Health   Financial Resource Strain: Not on file  Food Insecurity: Not on file  Transportation Needs: Not on file  Physical Activity: Not on file  Stress: Not on file  Social Connections: Not on file    Allergies:  Allergies  Allergen Reactions   Fish Allergy Nausea And Vomiting   Augmentin  [Amoxicillin -Pot Clavulanate] Diarrhea    Can take regular amoxicillin  with no issues Has patient had a PCN reaction causing immediate rash, facial/tongue/throat swelling, SOB or lightheadedness with hypotension: No Has patient had a PCN reaction causing severe rash involving mucus membranes or skin necrosis: No Has patient had a PCN reaction that required hospitalization: No Has patient had a PCN reaction occurring within the last 10 years: Yes--but DIARRHEA ONLY If all of the above answers are NO, then may proceed with Cephalosporin us    Citalopram  Other (See Comments)    Tachycardia    Egg Protein-Containing Drug Products Diarrhea and Nausea And Vomiting    Hydroxyzine  Other (See Comments)    Dizziness   Requip  [Ropinirole ]     Not tolerated    Sulfonamide Derivatives Itching    Metabolic Disorder Labs: Lab Results  Component Value Date   HGBA1C 5.6 12/30/2023   Lab Results  Component Value Date   PROLACTIN 5.7 01/16/2022   PROLACTIN 7.5 09/05/2015   Lab Results  Component Value Date   CHOL 158 06/11/2021   TRIG 68.0 06/11/2021   HDL 58.30 06/11/2021   CHOLHDL 3 06/11/2021   VLDL 13.6 06/11/2021   LDLCALC 86 06/11/2021   LDLCALC 89 04/05/2019   Lab Results  Component Value Date   TSH 0.936 05/02/2024   TSH 0.95 12/30/2023    Therapeutic Level Labs: No results found for: LITHIUM No results found for: VALPROATE No results found for: CBMZ  Current Medications: Current Outpatient Medications  Medication Sig Dispense Refill   azelastine  (ASTELIN ) 0.1 % nasal spray Place 2 sprays into both nostrils 2 (two) times daily. Use in each nostril as directed 30 mL 1   busPIRone  (BUSPAR ) 10 MG tablet Take 2 tablets (20 mg total) by mouth 2 (two) times daily. 360 tablet 1   clonazePAM  (KLONOPIN ) 1 MG tablet Take 0.5-1 tablets (0.5-1 mg total) by mouth as directed. Take only for severe anxiety attacks , once daily as needed.Please limit use 15 tablet 0   FLUoxetine  (PROZAC ) 40 MG capsule Take 2 capsules (80 mg total) by mouth daily. 180 capsule 1   fluticasone  (FLONASE ) 50 MCG/ACT nasal spray Place 2 sprays into both nostrils 2 (two) times daily. 16 g 1   levonorgestrel (MIRENA) 20 MCG/24HR IUD 1 Intra Uterine Device (1 each total) by Intrauterine route once. 1 each 0   Multiple Vitamins-Iron (MULTI-VITAMIN/IRON PO) Take 1 tablet by mouth daily.      omeprazole  (PRILOSEC  OTC) 20 MG tablet Take 20 mg by mouth daily before breakfast.      rizatriptan  (MAXALT ) 10 MG tablet TAKE 1 TABLET BY MOUTH AS NEEDED FOR MIGRAINE. MAY REPEAT IN 2 HOURS IF NEEDED 10 tablet 2   topiramate  (TOPAMAX ) 100 MG tablet Take 1 tablet (100 mg total) by  mouth daily. 90 tablet 1   Vitamin D , Ergocalciferol , (DRISDOL ) 1.25 MG (50000 UNIT) CAPS capsule TAKE 1 CAPSULE BY MOUTH ONE TIME PER WEEK 12 capsule 3   calcium gluconate 500 MG  tablet Take 1 tablet by mouth 2 (two) times daily.  (Patient not taking: Reported on 08/17/2024)     mirtazapine  (REMERON ) 7.5 MG tablet Take 1 tablet (7.5 mg total) by mouth at bedtime. 90 tablet 0   No current facility-administered medications for this visit.     Musculoskeletal: Strength & Muscle Tone: within normal limits Gait & Station: normal Patient leans: N/A  Psychiatric Specialty Exam: Review of Systems  Psychiatric/Behavioral:  Positive for dysphoric mood and sleep disturbance. The patient is nervous/anxious.     Blood pressure 118/78, pulse 75, height 5' 2 (1.575 m), weight 136 lb 12.8 oz (62.1 kg), SpO2 99%.Body mass index is 25.02 kg/m.  General Appearance: Casual  Eye Contact:  Fair  Speech:  Clear and Coherent  Volume:  Normal  Mood:  Anxious and Depressed  Affect:  Congruent  Thought Process:  Goal Directed and Descriptions of Associations: Intact  Orientation:  Full (Time, Place, and Person)  Thought Content: Logical   Suicidal Thoughts:  No  Homicidal Thoughts:  No  Memory:  Immediate;   Fair Recent;   Fair Remote;   Fair  Judgement:  Fair  Insight:  Good  Psychomotor Activity:  Normal  Concentration:  Concentration: Fair and Attention Span: Fair  Recall:  [employer] of Knowledge: Fair  Language: Fair  Akathisia:  No  Handed:  Right  AIMS (if indicated): not done  Assets:  Manufacturing Systems Engineer Desire for Improvement Housing Social Support Transportation  ADL's:  Intact  Cognition: WNL  Sleep:  Poor   Screenings: GAD-7    Loss Adjuster, Chartered Office Visit from 08/17/2024 in [facility] Counselor from 07/20/2024 in [facility] Office Visit from 04/28/2024 in [facility]  [place] Visit from 04/17/2024 in [facility] Office Visit from 02/22/2024 in [facility] [employer]  Total GAD-7 Score 9 10 2 4 3    [employer]    Flowsheet Row Office Visit from 08/17/2024 in [facility] Counselor from 07/20/2024 in [facility] Office Visit from 04/28/2024 in [facility] Visit from 04/17/2024 in [facility] Office Visit from 02/22/2024 in [facility] [place]  PHQ-2 Total Score 4 4 2  0 2  PHQ-9 Total Score 16 16 5  -- 7   Flowsheet Row Office Visit from 08/17/2024 in [facility] Counselor from 07/20/2024 in [facility] UC from 06/18/2024 in [facility]   C-SSRS RISK CATEGORY No Risk No Risk No Risk     Assessment and Plan: Loise Esguerra is a 58 year old Caucasian female who has a history of PTSD, GAD was evaluated in office today.  Discussed assessment and plan as noted below.  1. PTSD (post-traumatic stress disorder)-unstable Currently struggling with mood symptoms likely also due to history of trauma although currently denies any significant intrusive memories or flashbacks. Will benefit from continued psychotherapy sessions. Currently follows up with Ms. [name] Husband  2. GAD (generalized anxiety disorder)-unstable Ongoing anxiety symptoms mostly work-related. Continue Fluoxetine  80 mg daily Continue BuSpar  20 mg twice daily Encouraged to take Mirtazapine  7.5 mg at bedtime daily, currently uses it only as needed. Will consider increasing the dosage in the future. Continue Clonazepam  0.5-1 mg as needed Continue Hydroxyzine  25 to 50 mg daily  as needed  3. Recurrent major  depressive disorder, recurrent, moderate-unstable Currently with worsening mood symptoms mostly due to situational stressors. Continue Fluoxetine  and BuSpar  as prescribed Encouraged to take Mirtazapine  daily.  4. Insomnia due to medical condition-unstable Ongoing sleep problems mostly due to lack of sleep hygiene, fatigue, work-related stressors. Encouraged to work on sleep hygiene techniques Encouraged to start taking mirtazapine  every night.   Follow-up Follow-up in clinic in 3 to 4 weeks or sooner if needed.    Collaboration of Care: Collaboration of Care: Referral or follow-up with counselor/therapist AEB encouraged to continue psychotherapy sessions, I have reviewed notes per Ms. Perkins dated 07/20/2024-currently working on addressing stressors at school, negative cognitions related to trauma symptoms.  Patient/Guardian was advised Release of Information must be obtained prior to any record release in order to collaborate their care with an outside provider. Patient/Guardian was advised if they have not already done so to contact the registration department to sign all necessary forms in order for us  to release information regarding their care.   Consent: Patient/Guardian gives verbal consent for treatment and assignment of benefits for services provided during this visit. Patient/Guardian expressed understanding and agreed to proceed.  This note was generated in part or whole with voice recognition software. Voice recognition is usually quite accurate but there are transcription errors that can and very often do occur. I apologize for any typographical errors that were not detected and corrected.     Lisandro Meggett, MD 08/18/2024, 8:59 AM

## 2024-08-29 NOTE — Progress Notes (Unsigned)
 THERAPIST PROGRESS NOTE  Session Time: ***  Participation Level: Active  Behavioral Response: CasualAlertEuthymic  Type of Therapy: Individual Therapy  Treatment Goals addressed:  Active     BH CCP Acute or Chronic Trauma Reaction     LTG: Elimination of maladaptive behaviors and thinking patterns which interfere with resolution of trauma as evidenced by self report.  (Not Progressing)     Start:  09/27/23    Expected End:  10/20/24       Goal Note     07/20/24: Pt reports patient reports due to stressors at school, she has experience negative cognitions related to trauma symptoms often feeling defeated and powerless.  Patient identifies difficulties in reframing negative cognitions.         LTG: Develop and implement effective coping skills to carry out normal responsibilities and participate constructively in relationships as evidenced by self report.  (Progressing)     Start:  09/27/23    Expected End:  10/20/24       Goal Note     07/20/24: Patient reports she has been attempting to utilize breathing techniques and mindfulness techniques to manage stress and anxiety associated with work stressors.         LTG: Pt reports:  feel like I have a little more control over my own emotions and feel like I'm a little more balanced.  (Not Progressing)     Start:  09/27/23    Expected End:  10/20/24      04/13/24: [name] she is able to utilize her logical mind to challenge trauma responses instead of believing everything of her emotional brain tells her.     Goal Note     07/20/24: Pt reports she feels powerless due to recent triggers identifying she is often disassociating and avoiding her feelings in an effort to cope.         STG: Mason will identify internal and external stimuli that trigger PTSD symptoms (Progressing)     Start:  09/27/23    Expected End:  10/20/24      04/13/24: I am becoming aware that I notice around birthday and holidays I start to think  about things more and I have more thins remind me of my ex more. Reports she has been able to catch herself with these triggers and increased hypervigilance. Shares improvements with her ability to cope.      Goal Note     Patient reports she is aware of her triggers.         STG: Christian will identify coping strategies to deal with trauma memories and the associated emotional reaction (Progressing)     Start:  09/27/23    Expected End:  10/20/24      04/13/24: Shares improved ability to utilize coping skills.       Cooperate with trauma-focused psychotherapy techniques to reduce emotional reaction to the traumatic event      Start:  09/27/23         Work with [name] to construct a list of the situations, people, & places that [place] evoke the most distressing symptoms; suggest that they keep a journal of instances of stress being triggered     Start:  09/27/23         Teach [name] coping strategies (e.g., writing down thoughts and feelings in a journal; taking deep, slow breaths; calling a support person to talk about memories) to deal with trauma memories and sudden emotional reactions without becoming emotionally  Start:  09/27/23         Educate [name] on trauma influenced cognitive distortions     Start:  09/27/23         Encourage Laure to identify 2 trauma related cognitive distortions     Start:  09/27/23           Self Esteem:         STG - Patient will identify 3 ways to improve self-esteem within 5 recreation therapy group sessions (Progressing)     Start:  09/27/23    Expected End:  10/20/24            LTG: Pt reports: I want to feel empowered again. (Not Progressing)     Start:  09/27/23    Expected End:  10/20/24      04/13/24: I feel better since I made the decision about work. I don't know that I've done as much outside of work that I've wanted.     Goal Note     07/20/24: Pt reports due to situations at her work she is  feeling powerless again and reminded of the mentality she was in a year ago when she first began therapeutic services.         Coping Skills      Start:  09/27/23       Will Work with patient to decrease the frequency of negative self-descriptive statements and increase the frequency of positive self- descriptive statements using CBT/DBT/REBT techniques per patient self report 3 out of 5 documented sessions. Some of the techniques that will be used will be CBT, positive affirmations, role playing, modeling, homework and journaling.           ProgressTowards Goals: Progressing  Interventions: Supportive, Reframing, and Other: EMDR  Summary: Armanie Ullmer is a 58 y.o. female who presents with symptoms of anxiety. Patient identifies symptoms to include uncontrollable worry, negative self affect, compulsions, isolated behaviors.Pt was oriented times 5. Pt was cooperative and engaged. Pt denies SI/HI/AVH.  The clinician utilized the first half of the session to review the patient's progress, as noted in the documentation above.  Suicidal/Homicidal: Nowithout intent/plan  Therapist Response: Cln utilized active and supportive reflection to create a safe environment for patient to process recent life stressors. Clinician assessed for current symptoms, stressors, safety since last session.    Plan: Return again in 3 weeks.  Diagnosis: PTSD (post-traumatic stress disorder)  GAD (generalized anxiety disorder)  MDD (major depressive disorder), recurrent episode, moderate (HCC)   Collaboration of Care: AEB psychiatrist can access notes and cln. Will review psychiatrists' notes. Check in with the patient and will see LCSW per availability. Patient agreed with treatment recommendations.   Patient/Guardian was advised Release of Information must be obtained prior to any record release in order to collaborate their care with an outside provider. Patient/Guardian was advised if they have not  already done so to contact the registration department to sign all necessary forms in order for us  to release information regarding their care.   Consent: Patient/Guardian gives verbal consent for treatment and assignment of benefits for services provided during this visit. Patient/Guardian expressed understanding and agreed to proceed.   [name] Husband, LCSW 08/29/2024

## 2024-08-30 ENCOUNTER — Ambulatory Visit (INDEPENDENT_AMBULATORY_CARE_PROVIDER_SITE_OTHER): Admitting: Licensed Clinical Social Worker

## 2024-08-30 DIAGNOSIS — F4310 Post-traumatic stress disorder, unspecified: Secondary | ICD-10-CM

## 2024-08-30 DIAGNOSIS — F331 Major depressive disorder, recurrent, moderate: Secondary | ICD-10-CM

## 2024-08-30 DIAGNOSIS — F411 Generalized anxiety disorder: Secondary | ICD-10-CM
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
## 2024-09-05 DIAGNOSIS — F411 Generalized anxiety disorder: Secondary | ICD-10-CM

## 2024-09-11 MED ORDER — CLONAZEPAM 1 MG PO TABS: 1.0000 mg | ORAL_TABLET | ORAL | 0 refills | Status: AC

## 2024-09-12 ENCOUNTER — Emergency Department

## 2024-09-12 ENCOUNTER — Emergency Department: Admission: EM | Admit: 2024-09-12 | Discharge: 2024-09-12 | Disposition: A | Discharge status: Home / Self Care

## 2024-09-12 ENCOUNTER — Ambulatory Visit: Payer: Self-pay

## 2024-09-12 DIAGNOSIS — R109 Unspecified abdominal pain: Secondary | ICD-10-CM

## 2024-09-12 DIAGNOSIS — R079 Chest pain, unspecified: Secondary | ICD-10-CM

## 2024-09-12 LAB — URINALYSIS, COMPLETE (UACMP) WITH MICROSCOPIC
Bilirubin Urine: NEGATIVE
Glucose, UA: NEGATIVE mg/dL
Hgb urine dipstick: NEGATIVE
Ketones, ur: NEGATIVE mg/dL
Leukocytes,Ua: NEGATIVE
Nitrite: NEGATIVE
Protein, ur: NEGATIVE mg/dL
Specific Gravity, Urine: 1.006 (ref 1.005–1.030)
pH: 6 (ref 5.0–8.0)

## 2024-09-12 LAB — CBC
HCT: 35 % — ABNORMAL LOW (ref 36.0–46.0)
Hemoglobin: 11.4 g/dL — ABNORMAL LOW (ref 12.0–15.0)
MCH: 28.6 pg (ref 26.0–34.0)
MCHC: 32.6 g/dL (ref 30.0–36.0)
MCV: 87.9 fL (ref 80.0–100.0)
Platelets: 302 K/uL (ref 150–400)
RBC: 3.98 MIL/uL (ref 3.87–5.11)
RDW: 13.8 % (ref 11.5–15.5)
WBC: 8.2 K/uL (ref 4.0–10.5)
nRBC: 0 % (ref 0.0–0.2)

## 2024-09-12 LAB — BASIC METABOLIC PANEL WITH GFR
Anion gap: 12 (ref 5–15)
BUN: 14 mg/dL (ref 6–20)
CO2: 22 mmol/L (ref 22–32)
Calcium: 8.8 mg/dL — ABNORMAL LOW (ref 8.9–10.3)
Chloride: 105 mmol/L (ref 98–111)
Creatinine, Ser: 0.83 mg/dL (ref 0.44–1.00)
GFR, Estimated: >60 mL/min (ref >60)
Glucose, Bld: 134 mg/dL — ABNORMAL HIGH (ref 70–99)
Potassium: 3.5 mmol/L (ref 3.5–5.1)
Sodium: 139 mmol/L (ref 135–145)

## 2024-09-12 LAB — HEPATIC FUNCTION PANEL
ALT: 98 U/L — ABNORMAL HIGH (ref 0–44)
AST: 166 U/L — ABNORMAL HIGH (ref 15–41)
Albumin: 4.1 g/dL (ref 3.5–5.0)
Alkaline Phosphatase: 114 U/L (ref 38–126)
Bilirubin, Direct: 0.1 mg/dL (ref 0.0–0.2)
Indirect Bilirubin: 0.1 mg/dL — ABNORMAL LOW (ref 0.3–0.9)
Total Bilirubin: 0.3 mg/dL (ref 0.0–1.2)
Total Protein: 6.5 g/dL (ref 6.5–8.1)

## 2024-09-12 LAB — LIPASE, BLOOD: Lipase: 77 U/L — ABNORMAL HIGH (ref 11–51)

## 2024-09-12 LAB — TROPONIN T, HIGH SENSITIVITY: Troponin T High Sensitivity: <15 ng/L (ref 0–19)

## 2024-09-12 MED ORDER — IOHEXOL 350 MG/ML SOLN
100.0000 mL | Freq: Once | INTRAVENOUS | Status: AC | PRN
  Administered 2024-09-12: 100 mL via INTRAVENOUS

## 2024-09-12 MED ORDER — SODIUM CHLORIDE 0.9 % IV BOLUS
1000.0000 mL | Freq: Once | INTRAVENOUS | Status: AC
  Administered 2024-09-12: 1000 mL via INTRAVENOUS

## 2024-09-12 NOTE — ED Triage Notes (Signed)
 Pt presents to the ED via POV from home with chest pain. Pt states that this started around 3pm today. States that it started in her stomach and moved to her chest. Pt states that she thought that it could be a panic attack because she has a hx of same, but states that she took klonopin  with no relief.

## 2024-09-12 NOTE — Telephone Encounter (Signed)
 Pt she stated that she is on her way to ED as we speak, chest hurting but not sob or tightening in chest, feels like it may be a panic attack

## 2024-09-12 NOTE — Discharge Instructions (Signed)
 You were seen in the emergency department for chest and abdominal pain.  Workup today was reassuring and you are safe to return home.  Please make note that your liver enzymes were slightly elevated today and you should have your primary care physician recheck these in 2 to 3 weeks.  Please continue your regular medications.  Please return with any acutely worsening symptoms or any other emergency.  I have placed an outpatient cardiology referral for you and you should receive a call to help schedule. -- RETURN PRECAUTIONS & AFTERCARE: (ENGLISH) RETURN PRECAUTIONS: Return immediately to the emergency department or see/call your doctor if you feel worse, weak or have changes in speech or vision, are short of breath, have fever, vomiting, pain, bleeding or dark stool, trouble urinating or any new issues. Return here or see/call your doctor if not improving as expected for your suspected condition. FOLLOW-UP CARE: Call your doctor and/or any doctors we referred you to for more advice and to make an appointment. Do this today, tomorrow or after the weekend. Some doctors only take PPO insurance so if you have HMO insurance you may want to contact your HMO or your regular doctor for referral to a specialist within your plan. Either way tell the doctor's office that it was a referral from the emergency department so you get the soonest possible appointment.  YOUR TEST RESULTS: Take result reports of any blood or urine tests, imaging tests and EKG's to your doctor and any referral doctor. Have any abnormal tests repeated. Your doctor or a referral doctor can let you know when this should be done. Also make sure your doctor contacts this hospital to get any test results that are not currently available such as cultures or special tests for infection and final imaging reports, which are often not available at the time you leave the ER but which may list additional important findings that are not documented on the  preliminary report. BLOOD PRESSURE: If your blood pressure was greater than 120/80 have your blood pressure rechecked within 1 to 2 weeks. MEDICATION SIDE EFFECTS: Do not drive, walk, bike, take the bus, etc. if you have received or are being prescribed any sedating medications such as those for pain or anxiety or certain antihistamines like Benadryl. If you have been give one of these here get a taxi home or have a friend drive you home. Ask your pharmacist to counsel you on potential side effects of any new medication

## 2024-09-12 NOTE — Telephone Encounter (Signed)
 FYI Only or Action Required?: nurse advised go to ED now  Patient was last seen in primary care on 04/28/2024 by Marylynn Verneita CROME, MD.  Called Nurse Triage reporting Chest Pain.  Symptoms began today.  Interventions attempted: Prescription medications: took anxiety medication.  Symptoms are: gradually worsening.  Triage Disposition: Go to ED Now (or PCP Triage)  Patient/caregiver understands and will follow disposition?: Yes    Copied from CRM #[id number]. Topic: Clinical - Red Word Triage >> Sep 12, 2024  4:16 PM Alfonso HERO wrote: Red Word that prompted transfer to Nurse Triage: chest and stomach pain. Took something but its not helping. Reason for Disposition  Taking a deep breath makes pain worse  Answer Assessment - Initial Assessment Questions 1. LOCATION: Where does it hurt?       Left chest - tightness, SOB 2. RADIATION: Does the pain go anywhere else? (e.g., into neck, jaw, arms, back)     no 3. ONSET: When did the chest pain begin? (Minutes, hours or days)      Started around 3 pm 4. PATTERN: Does the pain come and go, or has it been constant since it started?  Does it get worse with exertion?      constant 5. DURATION: How long does it last (e.g., seconds, minutes, hours)     na 6. SEVERITY: How bad is the pain?  (e.g., Scale 1-10; mild, moderate, or severe)     7/10 7. CARDIAC RISK FACTORS: Do you have any history of heart problems or risk factors for heart disease? (e.g., angina, prior heart attack; diabetes, high blood pressure, high cholesterol, smoker, or strong family history of heart disease)     na 8. PULMONARY RISK FACTORS: Do you have any history of lung disease?  (e.g., blood clots in lung, asthma, emphysema, birth control pills)     na 9. CAUSE: What do you think is causing the chest pain?     unsure 10. OTHER SYMPTOMS: Do you have any other symptoms? (e.g., dizziness, nausea, vomiting, sweating, fever, difficulty breathing, cough)        Nausea, stomach pain  11. PREGNANCY: Is there any chance you are pregnant? When was your last menstrual period?       Na  121/56 BP: pt not sure if anxiety attack or not: pt took anxiety mediation at 3:15pm & has not had relief: pt stated at first pain 12/10 now 7/10 chest tightness and feels like something sitting on chest: nurse recommended pt go to ER: pt stated will go sister in law to take her now.  Protocols used: Chest Pain-A-AH
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
## 2024-09-13 ENCOUNTER — Ambulatory Visit: Admitting: Licensed Clinical Social Worker

## 2024-09-14 ENCOUNTER — Ambulatory Visit

## 2024-09-14 VITALS — BP 98/64 | HR 71 | Ht 62.0 in | Wt 139.0 lb

## 2024-09-14 DIAGNOSIS — R072 Precordial pain: Secondary | ICD-10-CM | POA: Diagnosis not present

## 2024-09-14 DIAGNOSIS — R079 Chest pain, unspecified: Secondary | ICD-10-CM

## 2024-09-14 DIAGNOSIS — E782 Mixed hyperlipidemia: Secondary | ICD-10-CM | POA: Diagnosis not present

## 2024-09-14 DIAGNOSIS — I700 Atherosclerosis of aorta: Secondary | ICD-10-CM

## 2024-09-14 DIAGNOSIS — R7989 Other specified abnormal findings of blood chemistry: Secondary | ICD-10-CM | POA: Diagnosis not present

## 2024-09-14 NOTE — Patient Instructions (Signed)
 Medication Instructions:  Your physician recommends that you continue on your current medications as directed. Please refer to the Current Medication list given to you today.  *If you need a refill on your cardiac medications before your next appointment, please call your pharmacy*  Lab Work:  BMP completed on 09/12/2024   Follow up with Primary Care regarding elevated Liver enzymes  If you have labs (blood work) drawn today and your tests are completely normal, you will receive your results only by: MyChart Message (if you have MyChart) OR A paper copy in the mail If you have any lab test that is abnormal or we need to change your treatment, we will call you to review the results.  Testing/Procedures:  Your cardiac CT will be scheduled at one of the below locations:   [name] [address] [place], [place] [address] [phone]  [facility], please arrive 15 mins early for check-in and test prep.  There is spacious parking and easy access to the radiology department from the [facility] entrance. Please enter here and check-in with the desk attendant.   Please follow these instructions carefully (unless otherwise directed):  An IV will be required for this test and Nitroglycerin will be given.   On the Night Before the Test: Be sure to Drink plenty of water. Do not consume any caffeinated/decaffeinated beverages or chocolate 12 hours prior to your test. Do not take any antihistamines 12 hours prior to your test.   On the Day of the Test: Drink plenty of water until 1 hour prior to the test. Do not eat any food 1 hour prior to test. You may take your regular medications prior to the test.  FEMALES- please wear underwire-free bra if available, avoid dresses & tight clothing  After the Test: Drink plenty of water. After receiving IV contrast, you may experience a mild flushed feeling. This is normal. On occasion,  you may experience a mild rash up to 24 hours after the test. This is not dangerous. If this occurs, you can take Benadryl 25 mg, Zyrtec, Claritin, or Allegra and increase your fluid intake. (Patients taking Tikosyn should avoid Benadryl, and may take Zyrtec, Claritin, or Allegra) If you experience trouble breathing, this can be serious. If it is severe call 911 IMMEDIATELY. If it is mild, please call our office.  We will call to schedule your test 2-4 weeks out understanding that some insurance companies will need an authorization prior to the service being performed.   For more information and frequently asked questions, please visit our website : [email or web address]  For non-scheduling related questions, please contact the cardiac imaging nurse navigator should you have any questions/concerns: Cardiac Imaging Nurse Navigators Direct Office Dial: [phone]   For scheduling needs, including cancellations and rescheduling, please call Brittany, [phone].    ZIO XT- Long Term Monitor Instructions  Your physician has requested you wear a ZIO patch monitor for 14 days.  This is a single patch monitor. [employer] supplies one patch monitor per enrollment. Additional stickers are not available. Please do not apply patch if you will be having a Nuclear Stress Test, Echocardiogram, Cardiac CT, MRI, or Chest Xray during the period you would be wearing the monitor. The patch cannot be worn during these tests. You cannot remove and re-apply the ZIO XT patch monitor.  Your ZIO patch monitor will be mailed 3 day USPS to your address on file. It may take 3-5 days to receive  your monitor after you have been enrolled. Once you have received your monitor, please review the enclosed instructions. Your monitor has already been registered assigning a specific monitor serial number to you.  Billing and Patient Assistance Program Information  We have supplied [employer] with any of your insurance  information on file for billing purposes.  [employer] offers a sliding scale Patient Assistance Program for patients that do not have insurance, or whose insurance does not completely cover the cost of the ZIO monitor.  You must apply for the Patient Assistance Program to qualify for this discounted rate.  To apply, please call [employer] at [phone], select option 4, select option 2, ask to apply for Patient Assistance Program. Meredeth will ask your household income, and how many people are in your household. They will quote your out-of-pocket cost based on that information. [employer] will also be able to set up a 107-month, interest-free payment plan if needed.  Applying the monitor   Shave hair from upper left chest.  Hold abrader disc by orange tab. Rub abrader in 40 strokes over the upper left chest as indicated in your monitor instructions.  Clean area with 4 enclosed alcohol pads. Let dry.  Apply patch as indicated in monitor instructions. Patch will be placed under collarbone on left side of chest with arrow pointing upward.  Rub patch adhesive wings for 2 minutes. Remove label with white arrow. Remove other label. Rub patch adhesive wings for 2 additional minutes.  While looking in a mirror, press and release button in center of patch. A small green light will flash 3-4 times. This will be your only indicator that the monitor has been turned on.   After Applying Monitor: Do not shower for the first 24 hours. You may shower after the first 24 hours. (Keep back toward water) Press the button if you feel a symptom. You will hear a small click. Record Date, Time and Symptom in the Patient Logbook.   After Completing 14 Days: When you are ready to remove the patch, follow instructions on the last 2 pages of Patient Logbook.  Stick patch monitor into the tabs at the bottom of the return box.  Place Patient Logbook in the blue and white box. Use locking tab on box and tape box closed securely. The  blue and white box has prepaid postage on it. Please place it in the mailbox as soon as possible. Your physician should have your test results approximately 7-14 days after the monitor has been mailed back to [facility].   Troubleshooting: Call [facility] at [phone] if you have questions regarding your ZIO XT patch monitor.  Call them immediately if you see an orange light blinking on your monitor.  If your monitor falls off in less than 4 days, contact our Monitor department at [phone].  If your monitor becomes loose or falls off after 4 days call [employer] at [phone] for suggestions on securing your monitor.   Follow-Up: At [facility], you and your health needs are our priority.  As part of our continuing mission to provide you with exceptional heart care, our providers are all part of one team.  This team includes your primary Cardiologist (physician) and Advanced Practice Providers or APPs (Physician Assistants and Nurse Practitioners) who all work together to provide you with the care you need, when you need it.  Your next appointment:  As Needed   Provider:  Caron Poser, MD    We recommend signing up for  the patient portal called MyChart.  Sign up information is provided on this After Visit Summary.  MyChart is used to connect with patients for Virtual Visits (Telemedicine).  Patients are able to view lab/test results, encounter notes, upcoming appointments, etc.  Non-urgent messages can be sent to your provider as well.   To learn more about what you can do with MyChart, go to [email or web address].

## 2024-09-14 NOTE — Progress Notes (Signed)
°  Cardiology Office Note   Date:  09/14/2024  ID:  Selena [name], DOB 1966/06/10, MRN [id number] PCP: Marylynn Verneita CROME, MD  [facility] Providers Cardiologist:  Caron Poser, MD     History of Present Illness Selena [name] is a 58 y.o. 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  Patient recently seen in the ED for this issue 09/12/2024.  She came in with 4 hours of chest and abdominal pain.  Troponin, CBC, BMP unremarkable.  LFTs are mildly elevated with AST 166 and ALT 98.  Last LDL on file 86 06/2021.  CTPA was negative for PE.  Patient notes that she has had this kind of pain for many years.  It has worsened recently and she has had 2 fairly severe episodes.  She has never had any cardiac testing.  She notes her mother has atrial fibrillation.  She denies significant palpitations, but has had a couple of episodes of what sounds like near syncope.  Denies any shortness of breath, orthopnea, lower extremity edema, etc.  Relevant CVD History -CTPA 09/2024 aortic atherosclerosis, no significant CAC on my review   ROS: Pt denies any jaw pain, arm pain, palpitations, syncope, presyncope, orthopnea, PND, or LE edema.  Studies Reviewed I have independently reviewed the patient's ECG, recent CT scan, previous medical records, previous blood work.  Physical Exam VS:  BP 98/64 (BP Location: Left Arm, Patient Position: Sitting, Cuff Size: Normal)   Pulse 71   Ht 5' 2 (1.575 m)   Wt 139 lb (63 kg)   SpO2 98%   BMI 25.42 kg/m        Wt Readings from Last 3 Encounters:  09/14/24 139 lb (63 kg)  09/12/24 137 lb (62.1 kg)  04/28/24 138 lb (62.6 kg)    GEN: No acute distress. NECK: No JVD; No carotid bruits. CARDIAC: RRR, no murmurs, rubs, gallops. RESPIRATORY:  Clear to auscultation. EXTREMITIES:  Warm and well-perfused. No edema.  ASSESSMENT AND PLAN Chest discomfort Patient presents with chronic  chest pain that has worsened recently.  It has been bad enough that it has brought her to the ER.  Seems to occur after significant exertion.  She has never really had a cardiac evaluation for this issue.  Recent CT scan without significant CAC.  Plan: - Coronary CT angiogram to rule out obstructive CAD; further plans pending results  Near syncope Patient has had a couple of episodes of near syncope and brain fog over the past couple of years.  Her mother has atrial fibrillation.  Will plan to obtain a monitor to rule this out as both the cause of her episodes as well as her chronic chest pain.  Aortic atherosclerosis HLD Elevated LFTs On recent CT scan.  LFTs were mildly elevated while in the ED.  Will hold off on starting statin therapy given this finding.  Since her labs were just checked yesterday, it is a bit too early to recheck.  I advised her to reach out to her primary care for ongoing surveillance of her LFTs and further evaluation if needed.        Dispo: RTC as needed based on cardiac testing results  Signed, Caron Poser, MD

## 2024-09-15 ENCOUNTER — Encounter: Payer: Self-pay | Admitting: Internal Medicine

## 2024-09-15 ENCOUNTER — Other Ambulatory Visit: Payer: Self-pay | Admitting: Internal Medicine

## 2024-09-15 DIAGNOSIS — D649 Anemia, unspecified: Secondary | ICD-10-CM

## 2024-09-15 DIAGNOSIS — K760 Fatty (change of) liver, not elsewhere classified: Secondary | ICD-10-CM

## 2024-09-15 DIAGNOSIS — G2581 Restless legs syndrome: Secondary | ICD-10-CM

## 2024-09-20 ENCOUNTER — Telehealth: Admitting: Psychiatry

## 2024-09-20 ENCOUNTER — Encounter: Payer: Self-pay | Admitting: Psychiatry

## 2024-09-20 DIAGNOSIS — F411 Generalized anxiety disorder: Secondary | ICD-10-CM | POA: Diagnosis not present

## 2024-09-20 DIAGNOSIS — F3341 Major depressive disorder, recurrent, in partial remission: Secondary | ICD-10-CM | POA: Diagnosis not present

## 2024-09-20 DIAGNOSIS — G4701 Insomnia due to medical condition: Secondary | ICD-10-CM

## 2024-09-20 DIAGNOSIS — F4310 Post-traumatic stress disorder, unspecified: Secondary | ICD-10-CM

## 2024-09-20 NOTE — Progress Notes (Unsigned)
 Virtual Visit via Video Note  I connected with Selena [name] on 09/20/2024 at  4:00 PM EST by a video enabled telemedicine application and verified that I am speaking with the correct person using two identifiers.  Location Provider Location : [facility] Patient Location : Home  Participants: Patient , Provider   I discussed the limitations of evaluation and management by telemedicine and the availability of in person appointments. The patient expressed understanding and agreed to proceed.    I discussed the assessment and treatment plan with the patient. The patient was provided an opportunity to ask questions and all were answered. The patient agreed with the plan and demonstrated an understanding of the instructions.   The patient was advised to call back or seek an in-person evaluation if the symptoms worsen or if the condition fails to improve as anticipated.   BH MD OP Progress Note  09/20/2024 4:29 PM Selena [name]  MRN:  [id number]  Chief Complaint:  Chief Complaint  Patient presents with   Medication Refill   Follow-up   Depression   Anxiety   Discussed the use of AI scribe software for clinical note transcription with the patient, who gave verbal consent to proceed.  History of Present Illness Selena [name] is a 58 year old Caucasian female, employed, divorced, currently lives in [place], has a history of PTSD, GAD, MDD, insomnia, history of gastric bypass surgery in 2017, history of pituitary adenoma, migraine headaches, gastroesophageal reflux disease, history of abnormal LFT was evaluated by telemedicine today for a follow-up appointment.  She has experienced episodes of chest tightness and pain, which she associates with anxiety and work-related stress. She describes experiencing 2 episodes lasting over an hour each and sought emergency care due to uncertainty about whether these were panic attacks or cardiac events. She identifies her work environment,  including frequent physical restraint of a child and excessive paperwork demands, as significant stressors contributing to her symptoms. She expresses difficulty managing the workload and feeling unsupported by her employer, which has led to increased distress and consideration of alternative employment.  Regarding medication use, she reports taking clonazepam  twice since her last prescription and continues to take mirtazapine  and Prozac  without concerns. Sleep is okay, and she describes efforts to relax during her upcoming break from work.  She continues to be motivated to stay in psychotherapy with Ms. Perkins.  She denies any thoughts of hurting herself or others.  She reports undergoing cardiac evaluation including Holter monitoring and has a scheduled CT of the heart.   Visit Diagnosis:    ICD-10-CM   1. PTSD (post-traumatic stress disorder)  F43.10     2. GAD (generalized anxiety disorder)  F41.1     3. Recurrent major depressive disorder, in partial remission  F33.41     4. Insomnia due to medical condition  G47.01    Anxiety      Past Psychiatric History: I have reviewed past psychiatric history from progress note on 04/22/2022.  Past trials of Wellbutrin , Celexa , nortriptyline , Paxil , Klonopin , Requip , trazodone , Seroquel .  Past Medical History:  Past Medical History:  Diagnosis Date   Anemia, iron deficiency    Anxiety    Asthma    Colon polyps 2014   Complication of anesthesia    per patient report , woke during endoscopy in 2014 ; no issues with subsequent colonscopy also  in 2014    Costochondritis    srturggled wiht it since age 66 ; exacerbates with excessive movement of left arm; did phsycial therapy  in 08-2017 and hasnt had issues with it since    Depression    Eating disorder    Binge eating   Fibromyalgia    Gallstones    GERD (gastroesophageal reflux disease)    Heart murmur    at birth; resolved    Hyperlipidemia    Hypertension    was due to birth  control  ,  as soon as they took it out, it went away       Past Surgical History:  Procedure Laterality Date   CHOLECYSTECTOMY     COLONOSCOPY     [facility] dr debrah    COLONOSCOPY WITH PROPOFOL  N/A 08/01/2021   Procedure: COLONOSCOPY WITH PROPOFOL ;  Surgeon: Janalyn Keene NOVAK, MD;  Location: [facility] END[facility];  Service: Endoscopy;  Laterality: N/A;   EVALUATION UNDER ANESTHESIA WITH HEMORRHOIDECTOMY N/A 06/24/2018   Procedure: EXAM UNDER ANESTHESIA WITH EXTERNAL HEMORRHOIDECTOMY;  Surgeon: Gladis Cough, MD;  Location: [facility];  Service: General;  Laterality: N/A;   EXPLORATORY LAPAROTOMY  2000   for infertility   GANGLION CYST EXCISION     GASTRIC ROUX-EN-Y N/A 01/24/2018   Procedure: LAPAROSCOPIC ROUX-EN-Y GASTRIC BYPASS WITH UPPER ENDOSCOPY;  Surgeon: Gladis Cough, MD;  Location: [facility];  Service: General;  Laterality: N/A;   SPHINCTEROTOMY N/A 06/24/2018   Procedure: LATERAL INTERNAL SPHINCTEROTOMY;  Surgeon: Gladis Cough, MD;  Location: [facility];  Service: General;  Laterality: N/A;   TONSILLECTOMY  1989    Family Psychiatric History: I have reviewed family psychiatric history from progress note on 04/22/2022.  Family History:  Family History  Problem Relation Age of Onset   Hyperlipidemia Mother    Heart disease Mother        Atrial fibrilation   Cancer Mother        ? Melanoma   Atrial fibrillation Mother    Depression Father    Bipolar disorder Father    Diabetes Father    Mental illness Father        Bipolar   Leukemia Father 32       MDS< then leukemia    Depression Sister    Cancer Sister        Clear cell sarcoma in leg   Hypothyroidism Sister    Arrhythmia Maternal Grandfather    Hypertension Maternal Grandmother    Heart disease Maternal Grandmother        Afib   Arrhythmia Maternal Grandmother    Arthritis Paternal Grandmother    ADD / ADHD Nephew    Anxiety disorder Nephew    ADD / ADHD Son    Autism spectrum disorder Son    Colon  cancer Neg Hx    Rectal cancer Neg Hx    Pancreatic cancer Neg Hx     Social History: I have reviewed social history from progress note on 04/22/2022. Social History   Socioeconomic History   Marital status: Divorced    Spouse name: Not on file   Number of children: 1   Years of education: Not on file   Highest education level: Master's degree (e.g., MA, MS, MEng, MEd, MSW, MBA)  Occupational History   Occupation: Teaches preschool    Employer: [employer]  Tobacco Use   Smoking status: Never    Passive exposure: Never   Smokeless tobacco: Never  Vaping Use   Vaping status: Never Used  Substance and Sexual Activity   Alcohol use: Yes    Comment: 1 every 3 - 4 months  Drug use: Never   Sexual activity: Yes    Birth control/protection: None    Comment: With same partner for  over 3 years  Other Topics Concern   Not on file  Social History Narrative   2 Step children; asthma, ADHD   Social Drivers of Health   Tobacco Use: Low Risk (09/20/2024)   Patient History    Smoking Tobacco Use: Never    Smokeless Tobacco Use: Never    Passive Exposure: Never  Financial Resource Strain: Not on file  Food Insecurity: Not on file  Transportation Needs: Not on file  Physical Activity: Not on file  Stress: Not on file  Social Connections: Not on file  Depression (PHQ2-9): High Risk (08/17/2024)   Depression (PHQ2-9)    PHQ-2 Score: 16  Alcohol Screen: Not on file  Housing: Unknown (11/26/2023)   Received from [facility]   Epic    Unable to Pay for Housing in the Last Year: Not on file    Number of Times Moved in the Last Year: Not on file    At any time in the past 12 months, were you homeless or living in a shelter (including now)?: No  Utilities: Not on file  Health Literacy: Not on file    Allergies: Allergies[1]  Metabolic Disorder Labs: Lab Results  Component Value Date   HGBA1C 5.6 12/30/2023   Lab Results  Component Value Date    PROLACTIN 5.7 01/16/2022   PROLACTIN 7.5 09/05/2015   Lab Results  Component Value Date   CHOL 158 06/11/2021   TRIG 68.0 06/11/2021   HDL 58.30 06/11/2021   CHOLHDL 3 06/11/2021   VLDL 13.6 06/11/2021   LDLCALC 86 06/11/2021   LDLCALC 89 04/05/2019   Lab Results  Component Value Date   TSH 0.936 05/02/2024   TSH 0.95 12/30/2023    Therapeutic Level Labs: No results found for: LITHIUM No results found for: VALPROATE No results found for: CBMZ  Current Medications: Current Outpatient Medications  Medication Sig Dispense Refill   azelastine  (ASTELIN ) 0.1 % nasal spray Place 2 sprays into both nostrils 2 (two) times daily. Use in each nostril as directed 30 mL 1   busPIRone  (BUSPAR ) 10 MG tablet Take 2 tablets (20 mg total) by mouth 2 (two) times daily. 360 tablet 1   clonazePAM  (KLONOPIN ) 1 MG tablet Take 1-1.5 tablets (1-1.5 mg total) by mouth as directed. Take only for severe anxiety attacks , once daily as needed.Please limit use 21 tablet 0   FLUoxetine  (PROZAC ) 40 MG capsule Take 2 capsules (80 mg total) by mouth daily. 180 capsule 1   fluticasone  (FLONASE ) 50 MCG/ACT nasal spray Place 2 sprays into both nostrils 2 (two) times daily. 16 g 1   mirtazapine  (REMERON ) 7.5 MG tablet Take 1 tablet (7.5 mg total) by mouth at bedtime. 90 tablet 0   Multiple Vitamins-Iron (MULTI-VITAMIN/IRON PO) Take 1 tablet by mouth daily.      omeprazole  (PRILOSEC  OTC) 20 MG tablet Take 20 mg by mouth daily before breakfast.      rizatriptan  (MAXALT ) 10 MG tablet TAKE 1 TABLET BY MOUTH AS NEEDED FOR MIGRAINE. MAY REPEAT IN 2 HOURS IF NEEDED 10 tablet 2   topiramate  (TOPAMAX ) 100 MG tablet Take 1 tablet (100 mg total) by mouth daily. 90 tablet 1   Vitamin D , Ergocalciferol , (DRISDOL ) 1.25 MG (50000 UNIT) CAPS capsule TAKE 1 CAPSULE BY MOUTH ONE TIME PER WEEK (Patient not taking: Reported on 09/14/2024) 12 capsule  3   No current facility-administered medications for this visit.      Musculoskeletal: Strength & Muscle Tone: UTA Gait & Station: Seated Patient leans: N/A  Psychiatric Specialty Exam: Review of Systems  Psychiatric/Behavioral:  The patient is nervous/anxious.     There were no vitals taken for this visit.There is no height or weight on file to calculate BMI.  General Appearance: Casual  Eye Contact:  Fair  Speech:  Clear and Coherent  Volume:  Normal  Mood:  Anxious  Affect:  Congruent  Thought Process:  Goal Directed and Descriptions of Associations: Intact  Orientation:  Full (Time, Place, and Person)  Thought Content: Logical   Suicidal Thoughts:  No  Homicidal Thoughts:  No  Memory:  Immediate;   Fair Recent;   Fair Remote;   Fair  Judgement:  Fair  Insight:  Fair  Psychomotor Activity:  Normal  Concentration:  Concentration: Fair and Attention Span: Fair  Recall:  [employer] of Knowledge: Fair  Language: Fair  Akathisia:  No  Handed:  Right  AIMS (if indicated): not done  Assets:  Manufacturing Systems Engineer Desire for Improvement Housing Social Support Transportation  ADL's:  Intact  Cognition: WNL  Sleep:  improving   Screenings: GAD-7    Loss Adjuster, Chartered Office Visit from 08/17/2024 in [facility] Counselor from 07/20/2024 in [facility] Office Visit from 04/28/2024 in [facility] [employer] Visit from 04/17/2024 in [facility] Office Visit from 02/22/2024 in [facility] [employer]  Total GAD-7 Score 9 10 2 4 3    [employer]    Flowsheet Row Office Visit from 08/17/2024 in [facility] Counselor from 07/20/2024 in [facility] Office Visit from 04/28/2024 in [facility] Visit from 04/17/2024 in [facility]  [facility] Office Visit from 02/22/2024 in [facility] Total Score 4 4 2  0 2  PHQ-9 Total Score 16 16 5  -- 7   Flowsheet Row Video Visit from 09/20/2024 in [facility] ED from 09/12/2024 in [facility] Emergency Department at [facility] Visit from 08/17/2024 in [facility]  C-SSRS RISK CATEGORY No Risk No Risk No Risk     Assessment and Plan: Selena [name] is a 58 year old Caucasian female who presented for a follow-up appointment, discussed assessment and plan as noted below.  .1. PTSD (post-traumatic stress disorder)-unstable Currently continues to have anxiety related to certain triggers especially with regards to work-related stressors. Continue Fluoxetine  as prescribed Continue BuSpar  20 mg twice daily Continue psychotherapy sessions with Ms. [name] Husband  2. GAD (generalized anxiety disorder)-improving Does have anxiety mostly related to work-related stressors.  Reports anxiety has improved since she looks forward to winter break which is going to start in a couple of days. Continue Fluoxetine  80 mg daily Continue BuSpar  as prescribed Continue Mirtazapine  7.5 mg at bedtime. Continue Clonazepam  1-1.5 mg as needed for severe anxiety attacks only Continue Hydroxyzine  25 to 50 mg daily as needed  3. Recurrent major depressive disorder, in partial remission Currently reports depression is improving and reports her work-related stress as the cause for her mood symptoms. Continue psychotherapy sessions with Ms. Perkins Continue Fluoxetine , BuSpar  and Mirtazapine  as prescribed.  4. Insomnia due to medical condition-improving Currently reports sleep  is improving Continue sleep hygiene techniques Continue Mirtazapine  7.5 mg at bedtime  Follow-up Follow-up in clinic in 4 to 6 weeks or sooner if needed.    Collaboration of  Care: Collaboration of Care: Referral or follow-up with counselor/therapist AEB encouraged to continue psychotherapy sessions.  Encouraged to follow-up with cardiology will coordinate care.  Patient/Guardian was advised Release of Information must be obtained prior to any record release in order to collaborate their care with an outside provider. Patient/Guardian was advised if they have not already done so to contact the registration department to sign all necessary forms in order for us  to release information regarding their care.   Consent: Patient/Guardian gives verbal consent for treatment and assignment of benefits for services provided during this visit. Patient/Guardian expressed understanding and agreed to proceed.   This note was generated in part or whole with voice recognition software. Voice recognition is usually quite accurate but there are transcription errors that can and very often do occur. I apologize for any typographical errors that were not detected and corrected.    Elenor Wildes, MD 09/22/2024, 7:52 AM     [1]  Allergies Allergen Reactions   Fish Allergy Nausea And Vomiting   Augmentin  [Amoxicillin -Pot Clavulanate] Diarrhea    Can take regular amoxicillin  with no issues Has patient had a PCN reaction causing immediate rash, facial/tongue/throat swelling, SOB or lightheadedness with hypotension: No Has patient had a PCN reaction causing severe rash involving mucus membranes or skin necrosis: No Has patient had a PCN reaction that required hospitalization: No Has patient had a PCN reaction occurring within the last 10 years: Yes--but DIARRHEA ONLY If all of the above answers are NO, then may proceed with Cephalosporin us    Citalopram  Other (See Comments)    Tachycardia    Egg Protein-Containing Drug Products Diarrhea and Nausea And Vomiting   Hydroxyzine  Other (See Comments)    Dizziness   Requip  [Ropinirole ]     Not tolerated    Sulfonamide Derivatives  Itching

## 2024-09-22 ENCOUNTER — Encounter (HOSPITAL_COMMUNITY): Payer: Self-pay

## 2024-09-25 ENCOUNTER — Encounter: Admission: RE | Payer: Self-pay | Source: Home / Self Care

## 2024-09-25 ENCOUNTER — Ambulatory Visit: Admission: RE | Admit: 2024-09-25 | Source: Home / Self Care

## 2024-09-25 SURGERY — COLONOSCOPY
Anesthesia: General

## 2024-09-26 ENCOUNTER — Ambulatory Visit (HOSPITAL_COMMUNITY)
Admission: RE | Admit: 2024-09-26 | Discharge: 2024-09-26 | Disposition: A | Discharge status: Home / Self Care | Source: Ambulatory Visit

## 2024-09-26 DIAGNOSIS — R072 Precordial pain: Secondary | ICD-10-CM | POA: Diagnosis not present

## 2024-09-26 MED ORDER — IOHEXOL 350 MG/ML SOLN
100.0000 mL | Freq: Once | INTRAVENOUS | Status: AC | PRN
  Administered 2024-09-26: 100 mL via INTRAVENOUS

## 2024-09-26 MED ORDER — NITROGLYCERIN 0.4 MG SL SUBL
0.8000 mg | SUBLINGUAL_TABLET | Freq: Once | SUBLINGUAL | Status: AC
  Administered 2024-09-26: 0.8 mg via SUBLINGUAL

## 2024-09-27 ENCOUNTER — Ambulatory Visit: Payer: Self-pay

## 2024-09-27 ENCOUNTER — Ambulatory Visit (INDEPENDENT_AMBULATORY_CARE_PROVIDER_SITE_OTHER)

## 2024-09-27 VITALS — BP 120/82 | HR 99 | Temp 100.1°F | Ht 62.0 in | Wt 137.0 lb

## 2024-09-27 DIAGNOSIS — R509 Fever, unspecified: Secondary | ICD-10-CM

## 2024-09-27 DIAGNOSIS — J101 Influenza due to other identified influenza virus with other respiratory manifestations: Secondary | ICD-10-CM | POA: Diagnosis not present

## 2024-09-27 LAB — POC COVID19 BINAXNOW: SARS Coronavirus 2 Ag: NEGATIVE

## 2024-09-27 LAB — POCT INFLUENZA A/B
Influenza A, POC: POSITIVE — AB
Influenza B, POC: NEGATIVE

## 2024-09-27 MED ORDER — OSELTAMIVIR PHOSPHATE 75 MG PO CAPS: 75.0000 mg | ORAL_CAPSULE | Freq: Two times a day (BID) | ORAL | 0 refills | Status: AC

## 2024-09-27 MED ORDER — OXYMETAZOLINE HCL 0.05 % NA SOLN: 1.0000 | Freq: Two times a day (BID) | NASAL | 0 refills | Status: AC

## 2024-09-27 NOTE — Patient Instructions (Signed)
 Thank you for visiting [facility] today! Here's what we talked about: - START Tamiflu  and Afrin

## 2024-09-27 NOTE — Progress Notes (Signed)
 "  Subjective:   This visit was conducted in person. The patient gave informed consent to the use of Abridge AI technology to record the contents of the encounter as documented below.   Patient ID: Selena [name], female    DOB: June 16, 1966, 58 y.o.   MRN: [id number]   Discussed the use of AI scribe software for clinical note transcription with the patient, who gave verbal consent to proceed.  History of Present Illness Selena [name] is a 58 year old female who presents with symptoms of influenza.  She is on day four of symptoms, including body aches, chills, and nasal congestion, with tenderness over the maxillary sinuses bilaterally. She has experienced elevated temperatures at home despite using a daytime Mucinex containing acetaminophen , a cough suppressant, and another component.  She is concerned about exposing her 94 year old mother to the flu, especially during the holiday season. Both she and her mother have received a flu shot, but she is aware that her mother is at higher risk due to her age. She is currently on vacation from school, which minimizes her exposure to others outside her family.  She has been managing her symptoms by drinking lots of fluids and resting.    Review of Systems  All other systems reviewed and are negative.       Allergies[1]  Medications Ordered Prior to Encounter[2]  BP 120/82 (BP Location: Left Arm, Patient Position: Sitting, Cuff Size: Normal)   Pulse 99   Temp 100.1 F (37.8 C) (Oral)   Ht 5' 2 (1.575 m)   Wt 137 lb (62.1 kg)   SpO2 95%   BMI 25.06 kg/m   Objective:      Physical Exam GENERAL: Alert, cooperative, well developed, no acute distress. HEAD: Normocephalic atraumatic. Tenderness over maxillary sinuses bilaterally. EYES: Extraocular movements intact BL, pupils round, equal and reactive to light BL, conjunctivae normal BL. EARS: Tympanic membrane, ear canal and external ear normal BL. NOSE: No congestion or  rhinorrhea, mucous membranes are moist. THROAT: Pharynx normal, no oropharyngeal exudate or posterior oropharyngeal erythema. CARDIOVASCULAR: Normal heart rate and rhythm, S1 and S2 normal without murmurs. CHEST: Clear to auscultation bilaterally, no wheezes, rhonchi, or crackles. EXTREMITIES: No cyanosis or edema.         Assessment & Plan:    Assessment & Plan Influenza A with acute sinusitis Influenza A with acute sinusitis, day four of symptoms. Persistent fever likely due to insufficient acetaminophen  dosage. High transmission risk to elderly, especially her 42 year old mother.  - Start Tamiflu  given pt remains unimproved clinically and may still benefit - Use Tylenol  635 mg or two extra strength Tylenol  every eight hours for fever. - Use Afrin nasal spray for nasal congestion - Continue Mucinex. - Maintain hydration and rest. - Avoid contact with individuals over 65 until day seven or symptom resolution. - Use hand sanitizer and wear a mask around others. - Follow up with Doctor Tullo if no improvement in 5-7 days.    Return for worsening of symptoms or failure to improve.   Larico Dimock K Audrey Eller, MD  09/27/2024     Contains text generated by Abridge.        [1]  Allergies Allergen Reactions   Fish Allergy Nausea And Vomiting   Augmentin  [Amoxicillin -Pot Clavulanate] Diarrhea    Can take regular amoxicillin  with no issues Has patient had a PCN reaction causing immediate rash, facial/tongue/throat swelling, SOB or lightheadedness with hypotension: No Has patient had a PCN reaction causing severe rash involving mucus membranes  or skin necrosis: No Has patient had a PCN reaction that required hospitalization: No Has patient had a PCN reaction occurring within the last 10 years: Yes--but DIARRHEA ONLY If all of the above answers are NO, then may proceed with Cephalosporin us    Citalopram  Other (See Comments)    Tachycardia    Egg Protein-Containing Drug Products  Diarrhea and Nausea And Vomiting   Hydroxyzine  Other (See Comments)    Dizziness   Requip  [Ropinirole ]     Not tolerated    Sulfonamide Derivatives Itching  [2]  Current Outpatient Medications on File Prior to Visit  Medication Sig Dispense Refill   azelastine  (ASTELIN ) 0.1 % nasal spray Place 2 sprays into both nostrils 2 (two) times daily. Use in each nostril as directed 30 mL 1   busPIRone  (BUSPAR ) 10 MG tablet Take 2 tablets (20 mg total) by mouth 2 (two) times daily. 360 tablet 1   clonazePAM  (KLONOPIN ) 1 MG tablet Take 1-1.5 tablets (1-1.5 mg total) by mouth as directed. Take only for severe anxiety attacks , once daily as needed.Please limit use 21 tablet 0   FLUoxetine  (PROZAC ) 40 MG capsule Take 2 capsules (80 mg total) by mouth daily. 180 capsule 1   fluticasone  (FLONASE ) 50 MCG/ACT nasal spray Place 2 sprays into both nostrils 2 (two) times daily. 16 g 1   mirtazapine  (REMERON ) 7.5 MG tablet Take 1 tablet (7.5 mg total) by mouth at bedtime. 90 tablet 0   Multiple Vitamins-Iron (MULTI-VITAMIN/IRON PO) Take 1 tablet by mouth daily.      omeprazole  (PRILOSEC  OTC) 20 MG tablet Take 20 mg by mouth daily before breakfast.      rizatriptan  (MAXALT ) 10 MG tablet TAKE 1 TABLET BY MOUTH AS NEEDED FOR MIGRAINE. MAY REPEAT IN 2 HOURS IF NEEDED 10 tablet 2   topiramate  (TOPAMAX ) 100 MG tablet Take 1 tablet (100 mg total) by mouth daily. 90 tablet 1   Vitamin D , Ergocalciferol , (DRISDOL ) 1.25 MG (50000 UNIT) CAPS capsule TAKE 1 CAPSULE BY MOUTH ONE TIME PER WEEK 12 capsule 3   No current facility-administered medications on file prior to visit.   "

## 2024-09-27 NOTE — Telephone Encounter (Signed)
" °  FYI Only or Action Required?: FYI only for provider: appointment scheduled on 09/27/2024 at [facility].  Patient was last seen in primary care on 04/28/2024 by Marylynn Verneita CROME, MD.  Called Nurse Triage reporting Facial Pain and Fever.  Symptoms began several days ago.  Interventions attempted: Nothing.  Symptoms are: unchanged.  Triage Disposition: See Physician Within 24 Hours  Patient/caregiver understands and will follow disposition?: Yes  Copied from CRM #[id number]. Topic: Clinical - Red Word Triage >> Sep 27, 2024  8:09 AM Adelita E wrote: [facility] that prompted transfer to Nurse Triage: Worsening cold since this past Saturday. Productive cough producing green phlegm. Reason for Disposition  Fever present > 3 days (72 hours)  Answer Assessment - Initial Assessment Questions 1. LOCATION: Where does it hurt?      Behind eyes, bridge of nose, cheeks 2. ONSET: When did the sinus pain start?  (e.g., hours, days)      Saturday, 4 days ago 5. NASAL CONGESTION: Is the nose blocked? If Yes, ask: Can you open it or must you breathe through your mouth?     congested 6. NASAL DISCHARGE: Do you have discharge from your nose? If so ask, What color?     Was running, but now congested 7. FEVER: Do you have a fever? If Yes, ask: What is it, how was it measured, and when did it start?      100.8, low grade x 3 days 8. OTHER SYMPTOMS: Do you have any other symptoms? (e.g., sore throat, cough, earache, difficulty breathing)     cough  Protocols used: Sinus Pain or Congestion-A-AH  "

## 2024-09-29 ENCOUNTER — Other Ambulatory Visit: Payer: Self-pay | Admitting: *Deleted

## 2024-09-29 MED ORDER — ASPIRIN 81 MG PO TBEC: 81.0000 mg | DELAYED_RELEASE_TABLET | Freq: Every day | ORAL | Status: AC

## 2024-10-02 ENCOUNTER — Ambulatory Visit: Payer: Self-pay
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
## 2024-10-03 ENCOUNTER — Ambulatory Visit: Admitting: Licensed Clinical Social Worker

## 2024-10-03 ENCOUNTER — Other Ambulatory Visit (INDEPENDENT_AMBULATORY_CARE_PROVIDER_SITE_OTHER)

## 2024-10-03 DIAGNOSIS — F411 Generalized anxiety disorder: Secondary | ICD-10-CM

## 2024-10-03 DIAGNOSIS — F3341 Major depressive disorder, recurrent, in partial remission: Secondary | ICD-10-CM | POA: Diagnosis not present

## 2024-10-03 DIAGNOSIS — F4310 Post-traumatic stress disorder, unspecified: Secondary | ICD-10-CM | POA: Diagnosis not present

## 2024-10-03 DIAGNOSIS — K760 Fatty (change of) liver, not elsewhere classified: Secondary | ICD-10-CM

## 2024-10-03 DIAGNOSIS — G2581 Restless legs syndrome: Secondary | ICD-10-CM

## 2024-10-03 DIAGNOSIS — D649 Anemia, unspecified: Secondary | ICD-10-CM | POA: Diagnosis not present

## 2024-10-03 LAB — CBC WITH DIFFERENTIAL/PLATELET
Basophils Absolute: 0 K/uL (ref 0.0–0.1)
Basophils Relative: 1 % (ref 0.0–3.0)
Eosinophils Absolute: 0.1 K/uL (ref 0.0–0.7)
Eosinophils Relative: 1.8 % (ref 0.0–5.0)
HCT: 39.9 % (ref 36.0–46.0)
Hemoglobin: 13.1 g/dL (ref 12.0–15.0)
Lymphocytes Relative: 47.2 % — ABNORMAL HIGH (ref 12.0–46.0)
Lymphs Abs: 1.8 K/uL (ref 0.7–4.0)
MCHC: 32.8 g/dL (ref 30.0–36.0)
MCV: 86.3 fl (ref 78.0–100.0)
Monocytes Absolute: 0.3 K/uL (ref 0.1–1.0)
Monocytes Relative: 7.3 % (ref 3.0–12.0)
Neutro Abs: 1.6 K/uL (ref 1.4–7.7)
Neutrophils Relative %: 42.7 % — ABNORMAL LOW (ref 43.0–77.0)
Platelets: 360 K/uL (ref 150.0–400.0)
RBC: 4.62 Mil/uL (ref 3.87–5.11)
RDW: 14.3 % (ref 11.5–15.5)
WBC: 3.8 K/uL — ABNORMAL LOW (ref 4.0–10.5)

## 2024-10-03 LAB — COMPREHENSIVE METABOLIC PANEL WITH GFR
ALT: 24 U/L (ref 3–35)
AST: 22 U/L (ref 5–37)
Albumin: 4.3 g/dL (ref 3.5–5.2)
Alkaline Phosphatase: 79 U/L (ref 39–117)
BUN: 11 mg/dL (ref 6–23)
CO2: 26 meq/L (ref 19–32)
Calcium: 8.7 mg/dL (ref 8.4–10.5)
Chloride: 106 meq/L (ref 96–112)
Creatinine, Ser: 0.74 mg/dL (ref 0.40–1.20)
GFR: 90.41 mL/min
Glucose, Bld: 83 mg/dL (ref 70–99)
Potassium: 3.7 meq/L (ref 3.5–5.1)
Sodium: 139 meq/L (ref 135–145)
Total Bilirubin: 0.4 mg/dL (ref 0.2–1.2)
Total Protein: 6.8 g/dL (ref 6.0–8.3)

## 2024-10-03 LAB — LIPASE: Lipase: 76 U/L — ABNORMAL HIGH (ref 11.0–59.0)

## 2024-10-03 LAB — B12 AND FOLATE PANEL
Folate: 13.1 ng/mL
Vitamin B-12: 513 pg/mL (ref 211–911)

## 2024-10-04 ENCOUNTER — Encounter: Payer: Self-pay | Admitting: Internal Medicine

## 2024-10-04 LAB — CALCIUM, IONIZED: Calcium, Ion: 4.9 mg/dL (ref 4.7–5.5)

## 2024-10-04 LAB — IRON,TIBC AND FERRITIN PANEL
%SAT: 13 % — ABNORMAL LOW (ref 16–45)
Ferritin: 12 ng/mL — ABNORMAL LOW (ref 16–232)
Iron: 63 ug/dL (ref 45–160)
TIBC: 476 ug/dL — ABNORMAL HIGH (ref 250–450)

## 2024-10-05 ENCOUNTER — Ambulatory Visit: Payer: Self-pay | Admitting: Internal Medicine

## 2024-10-10 ENCOUNTER — Telehealth: Payer: Self-pay

## 2024-10-10 DIAGNOSIS — R079 Chest pain, unspecified: Secondary | ICD-10-CM

## 2024-10-10 DIAGNOSIS — I700 Atherosclerosis of aorta: Secondary | ICD-10-CM | POA: Diagnosis not present

## 2024-10-10 DIAGNOSIS — R55 Syncope and collapse: Secondary | ICD-10-CM | POA: Diagnosis not present

## 2024-10-10 DIAGNOSIS — R072 Precordial pain: Secondary | ICD-10-CM | POA: Diagnosis not present

## 2024-10-10 MED ORDER — METOPROLOL SUCCINATE ER 25 MG PO TB24: 25.0000 mg | ORAL_TABLET | Freq: Every day | ORAL | 3 refills | Status: DC

## 2024-10-10 NOTE — Telephone Encounter (Signed)
 Spoke with patient via phone regarding symptoms. She stated her symptoms are still occurring. I asked her about her as needed anxiety medication and wanted to know if she takes that after chest pains and she does and does not feel a decrease in the occurrence of the chest pain when taking her as needed anxiety medication. She wants to know if there is anything further that should be done or looked at because it does make her worried.

## 2024-10-10 NOTE — Addendum Note (Signed)
 Addended by: BRIEN SALM on: 10/10/2024 02:49 PM   Modules accepted: Orders

## 2024-10-10 NOTE — Telephone Encounter (Signed)
 Pt returning [place] call for results. Please advise.

## 2024-10-23 ENCOUNTER — Ambulatory Visit: Attending: Medical | Admitting: Medical

## 2024-10-23 ENCOUNTER — Encounter: Payer: Self-pay | Admitting: Medical

## 2024-10-23 ENCOUNTER — Telehealth: Payer: Self-pay

## 2024-10-23 VITALS — BP 116/58 | HR 60 | Wt 139.0 lb

## 2024-10-23 DIAGNOSIS — E782 Mixed hyperlipidemia: Secondary | ICD-10-CM | POA: Diagnosis not present

## 2024-10-23 DIAGNOSIS — Z79899 Other long term (current) drug therapy: Secondary | ICD-10-CM | POA: Diagnosis not present

## 2024-10-23 DIAGNOSIS — R55 Syncope and collapse: Secondary | ICD-10-CM | POA: Diagnosis not present

## 2024-10-23 DIAGNOSIS — R002 Palpitations: Secondary | ICD-10-CM

## 2024-10-23 DIAGNOSIS — R079 Chest pain, unspecified: Secondary | ICD-10-CM | POA: Diagnosis not present

## 2024-10-23 DIAGNOSIS — R7989 Other specified abnormal findings of blood chemistry: Secondary | ICD-10-CM

## 2024-10-23 DIAGNOSIS — I700 Atherosclerosis of aorta: Secondary | ICD-10-CM

## 2024-10-23 DIAGNOSIS — I2510 Atherosclerotic heart disease of native coronary artery without angina pectoris: Secondary | ICD-10-CM

## 2024-10-23 MED ORDER — METOPROLOL SUCCINATE ER 25 MG PO TB24: 37.5000 mg | ORAL_TABLET | Freq: Every day | ORAL | 3 refills | Status: AC

## 2024-10-23 NOTE — Telephone Encounter (Signed)
" °  Pt c/o of Chest Pain: STAT if active CP, including tightness, pressure, jaw pain, radiating pain to shoulder/upper arm/back, CP unrelieved by Nitro. Symptoms reported of SOB, nausea, vomiting, sweating.  1. Are you having CP right now?   Yes - soreness when she moves  2. Are you experiencing any other symptoms (ex. SOB, nausea, vomiting, sweating)?   SOB  3. Is your CP continuous or coming and going?   Coming and going  4. Have you taken Nitroglycerin ?  No  5. How long have you been experiencing CP?   Patient stated it started this morning.  Patient stated has been happening a couple times a day since starting on metoprolol  succinate (TOPROL  XL) 25 MG 24 hr tablet   6. If NO CP at time of call then end call with telling Pt to call back or call 911 if Chest pain returns prior to return call from triage team. opa  Patient is concerned her metoprolol  succinate (TOPROL  XL) 25 MG 24 hr tablet medication is helping somewhat but she is still having the racing heart beats/chest pain. "

## 2024-10-23 NOTE — Progress Notes (Signed)
 " Cardiology Office Note   Date:  10/23/2024  ID:  Selena [name], DOB 01/28/1966, MRN [id number] PCP: Marylynn Verneita CROME, MD   HeartCare Providers Cardiologist:  Caron Poser, MD   History of Present Illness Selena [name] is a 59 y.o. 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   The patient was seen in the ER 09/12/24 for chest discomfort. HS troponin was negative. AST 166 and ALT 98. Chest CTA was negative for PE.   The patient was seen 09/14/24 reporting chest pain for many years and near syncope. Cardiac CTA showed mild nonobstructive CAD. Heart monitor showed NSR, AVG HR of 79bpm, rare PACs, rare SVT, rare PVCs, 8 patient triggered events corresponded to NSR with PACs.  The patient was started on low-dose Toprol .  Today, she is overall doing ok. She is taking the metoprolol  and has noticed an improvement in her chest pain/palpitation. She was not taking ASA, but plans to start this.  Chest pains/palpitations occur every couple days. She has anxiety and takes medication for this. Symptoms may be affected by from stress at work.   Studies Reviewed EKG Interpretation Date/Time:  Monday October 23 2024 15:27:30 EST Ventricular Rate:  60 PR Interval:  130 QRS Duration:  82 QT Interval:  424 QTC Calculation: 424 R Axis:   52  Text Interpretation: Normal sinus rhythm Normal ECG When compared with ECG of 14-Sep-2024 14:42, No significant change was found Confirmed by Franchester, Alizandra Loh ([id number]) on 10/23/2024 3:29:15 PM    Heart monitor 10/2024 The monitor revealed predominantly sinus rhythm with mean HR of 79 bpm (range 52 - 142).    There were rare PACs and rare supraventricular couplets/triplets.   There were rare PVCs and no ventricular couplets/triplets.   There were 8 patient triggers and 8 diary entries which corresponded predominantly to sinus rhythm and occasionally PACs.   There were 9 episodes of SVT  (longest 11 beats, max HR 171bpm), likely pAT.   Caron Poser, MD   Cardiac CTA 10/2024   IMPRESSION: 1. Mild nonobstructive CAD in the proximal-mid LAD, 25-49% stenosis, CADRADS 2.   2. Coronary calcium score of 1. This was 72nd percentile for age and sex matched control.   3. Normal coronary origins with right dominance.   RECOMMENDATIONS: CAD-RADS 2. Mild non-obstructive CAD (25-49%). Consider non-atherosclerotic causes of chest pain. Consider preventive therapy and risk factor modification.  Physical Exam VS:  BP (!) 116/58 (BP Location: Left Arm, Patient Position: Sitting, Cuff Size: Normal)   Pulse 60   Wt 139 lb (63 kg)   SpO2 99%   BMI 25.42 kg/m        Wt Readings from Last 3 Encounters:  10/23/24 139 lb (63 kg)  09/27/24 137 lb (62.1 kg)  09/14/24 139 lb (63 kg)    GEN: Well nourished, well developed in no acute distress NECK: No JVD; No carotid bruits CARDIAC: RRR, no murmurs, rubs, gallops RESPIRATORY:  Clear to auscultation without rales, wheezing or rhonchi  ABDOMEN: Soft, non-tender, non-distended EXTREMITIES:  No edema; No deformity   ASSESSMENT AND PLAN  Palpitations Near syncope Recent heart monitor showed predominantly normal sinus rhythm with an average heart rate of 79 bpm, rare PACs, rare PVCs, PSVT longest lasting 11 beats, 8 patient triggered events corresponding with normal sinus rhythm with PACs.  Patient also seems to have associated chest pain.  She was started on Toprol  with improvement of symptoms.  High suspicion  that anxiety is also contributing to her symptoms.  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  I will increase Toprol  to 37.5 mg daily.  Chest pain Mild nonobstructive CAD Aortic Atherosclerosis The patient reports intermittent dull chest pain that seem to be related to her palpitations.  Cardiac CTA showed mild nonobstructive CAD.  She denies any exertional symptoms.  Increase  Toprol  as above.  We will reassess symptoms at follow-up.  HLD Elevated LFTs I will check a cholesterol panel today. May consider statin therapy.  She reports a history of intermittent elevated LFTs due to fatty liver.    Dispo: Follow-up in  Signed, Rylynne Schicker VEAR Fishman, PA-C   "

## 2024-10-23 NOTE — Patient Instructions (Signed)
 Medication Instructions:  Your physician recommends the following medication changes.  INCREASE: Metoprolol  to 37.5 mg by mouth daily    *If you need a refill on your cardiac medications before your next appointment, please call your pharmacy*  Lab Work: Your provider would like for you to have following labs drawn today Lipid, Direct LDL.     Testing/Procedures: No test ordered today   Follow-Up: At [facility], you and your health needs are our priority.  As part of our continuing mission to provide you with exceptional heart care, our providers are all part of one team.  This team includes your primary Cardiologist (physician) and Advanced Practice Providers or APPs (Physician Assistants and Nurse Practitioners) who all work together to provide you with the care you need, when you need it.  Your next appointment:   1 month(s)  Provider:   Caron Poser, MD or Cadence Franchester, PA-C

## 2024-10-23 NOTE — Telephone Encounter (Signed)
 I spoke with [name] in response to a Triage message mentioning chest pain and shortness of breath. Patient states that she is following up from seeing Dr. Argentina where metoprolol  succinate was started. Patient has had an improvement in the number of incidences but is still experiencing squeezing, pain and shortness of breath. Symptoms are concerning to the patient but not emergent enough to be seen at the Emergency room since there has been no changes in vital signs, edema, nausea and vomiting, vision or level of  cognition changes. Patient would like to be seen since she was instructed to reach out if needed. Patient was booked with Selena Fishman, PA today 10/23/2024 at 3:35 pm since Dr. Argentina is booked for the day and [name] returns to teaching tomorrow. Patient advised to reach out immediately if there are any rapid changes. Patient verbalized understanding and all questions answered at this time.

## 2024-10-24 LAB — LIPID PANEL
Chol/HDL Ratio: 3 ratio (ref 0.0–4.4)
Cholesterol, Total: 178 mg/dL (ref 100–199)
HDL: 60 mg/dL
LDL Chol Calc (NIH): 99 mg/dL (ref 0–99)
Triglycerides: 109 mg/dL (ref 0–149)
VLDL Cholesterol Cal: 19 mg/dL (ref 5–40)

## 2024-10-24 LAB — LDL CHOLESTEROL, DIRECT: LDL Direct: 106 mg/dL — ABNORMAL HIGH (ref 0–99)

## 2024-10-24 NOTE — Progress Notes (Unsigned)
 "  THERAPIST PROGRESS NOTE  Session Time: 4:02pm-  Participation Level: Active   Behavioral Response: CasualAlertAnxious   Type of Therapy: Individual Therapy  Treatment Goals addressed:  Active     BH CCP Acute or Chronic Trauma Reaction     STG: Pt reports: feel like I have a little more control over my own emotions and feel like I'm a little more balanced AEB learning 3 coping skills to manage emotional reactions      Start:  10/25/24    Expected End:  04/24/25            STG: Patient will practice siting with feelings for 5 minutes in session and taking a SUD in which through treatment, the patient will note a decrease in SUD scores, per self-report     Start:  10/25/24    Expected End:  04/24/25            LTG: Pt will improve trauma and overall level of functioning AEB PCL score of 30 or below within the next year.      Start:  10/25/24    Expected End:  04/24/25            Cooperate with trauma-focused psychotherapy techniques to reduce emotional reaction to the traumatic event      Start:  09/27/23         Work with [name] to construct a list of the situations, people, & places that [name] evoke the most distressing symptoms; suggest that they keep a journal of instances of stress being triggered     Start:  09/27/23         Teach [name] coping strategies (e.g., writing down thoughts and feelings in a journal; taking deep, slow breaths; calling a support person to talk about memories) to deal with trauma memories and sudden emotional reactions without becoming emotionally     Start:  09/27/23         Educate [name] on trauma influenced cognitive distortions     Start:  09/27/23         Encourage [name] to identify 2 trauma related cognitive distortions     Start:  09/27/23           Self Esteem:         LTG: Pt reports: I want to feel empowered again AEB using assertive communication 3x a week for the next 90 days     Start:   10/25/24    Expected End:  04/24/25            STG:  Identify and Challenge Three Negative Thoughts: Become aware of distorted or unhelpful thinking patterns and replace them with more balanced and constructive thoughts.     Start:  10/25/24    Expected End:  04/24/25            Coping Skills      Start:  09/27/23       Will Work with patient to decrease the frequency of negative self-descriptive statements and increase the frequency of positive self- descriptive statements using CBT/DBT/REBT techniques per patient self report 3 out of 5 documented sessions. Some of the techniques that will be used will be CBT, positive affirmations, role playing, modeling, homework and journaling.           ProgressTowards Goals: Progressing  Interventions: Strength-based, Conservator, Museum/gallery, and Supportive  Summary: Selena [name] is a 59 y.o. female who presents with symptoms of anxiety. Patient identifies symptoms to include uncontrollable  worry, negative self affect, compulsions, isolated behaviors.Pt was oriented times 5. Pt was cooperative and engaged. Pt denies SI/HI/AVH.   Cln utilized the first half of session to review patients progress. See progress notes documented above.   The clinician readministered the PHQ-9 and GAD-7 assessments. The patient's anxiety scores remained at 10, and depression scores also decreased from 16 to 9. The patient shares she recently lost her dog. Expressed concenrs about her sinabilityt o girve. Provioded psychoeducation on the stages of giref.   Cln readminstered the PCL5 with patients PTSD score decreased from a score of 32 to 12.  Patient reflected on a series of events in her personal life that have led her to feel out of control.  Explored patient's desire to control external factors to which she does not have control over.  Reflected on ways in which patient could redirect her focus to controllable factors.  Identified possible solutions to  mitigating stressors such as the patient exploring alternative career paths,  Suicidal/Homicidal: Nowithout intent/plan   Therapist Response:  Cln utilized active and supportive reflection to create a safe environment for patient to process recent life stressors. Clinician assessed for current symptoms, stressors, safety since last session.  The clinician readministered the PHQ-9, PCL5, and GAD-7 assessments.    Plan: Return again in 3 weeks.   Diagnosis: PTSD (post-traumatic stress disorder)   GAD (generalized anxiety disorder)   Recurrent major depressive disorder, in partial remission     Collaboration of Care:  AE[facility] psychiatrist can access notes and cln. Will review psychiatrists' notes. Check in with the patient and will see LCSW per availability. Patient agreed with treatment recommendations.  Patient/Guardian was advised Release of Information must be obtained prior to any record release in order to collaborate their care with an outside provider. Patient/Guardian was advised if they have not already done so to contact the registration department to sign all necessary forms in order for us  to release information regarding their care.   Consent: Patient/Guardian gives verbal consent for treatment and assignment of benefits for services provided during this visit. Patient/Guardian expressed understanding and agreed to proceed.   [name] Husband, LCSW 10/24/2024  "

## 2024-10-25 ENCOUNTER — Ambulatory Visit: Admitting: Licensed Clinical Social Worker

## 2024-10-25 DIAGNOSIS — F3341 Major depressive disorder, recurrent, in partial remission: Secondary | ICD-10-CM

## 2024-10-25 DIAGNOSIS — F411 Generalized anxiety disorder: Secondary | ICD-10-CM

## 2024-10-25 DIAGNOSIS — F4310 Post-traumatic stress disorder, unspecified: Secondary | ICD-10-CM | POA: Diagnosis not present

## 2024-10-26 ENCOUNTER — Ambulatory Visit: Payer: Self-pay | Admitting: Medical
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
## 2024-11-08 ENCOUNTER — Other Ambulatory Visit: Payer: Self-pay | Admitting: Internal Medicine

## 2024-11-13 ENCOUNTER — Telehealth: Admitting: Psychiatry

## 2024-11-23 ENCOUNTER — Ambulatory Visit
